# Patient Record
Sex: Female | Born: 1935
Health system: Southern US, Community
[De-identification: ages and names within clinical notes are randomized; demographics above are authoritative.]

## PROBLEM LIST (undated history)

## (undated) DIAGNOSIS — G20A1 Parkinson's disease without dyskinesia, without mention of fluctuations: Secondary | ICD-10-CM

## (undated) DIAGNOSIS — I10 Essential (primary) hypertension: Secondary | ICD-10-CM

## (undated) DIAGNOSIS — K219 Gastro-esophageal reflux disease without esophagitis: Secondary | ICD-10-CM

## (undated) DIAGNOSIS — I4510 Unspecified right bundle-branch block: Secondary | ICD-10-CM

## (undated) DIAGNOSIS — G709 Myoneural disorder, unspecified: Secondary | ICD-10-CM

## (undated) DIAGNOSIS — M8580 Other specified disorders of bone density and structure, unspecified site: Secondary | ICD-10-CM

## (undated) DIAGNOSIS — I890 Lymphedema, not elsewhere classified: Secondary | ICD-10-CM

## (undated) DIAGNOSIS — F419 Anxiety disorder, unspecified: Secondary | ICD-10-CM

## (undated) DIAGNOSIS — C50919 Malignant neoplasm of unspecified site of unspecified female breast: Secondary | ICD-10-CM

## (undated) DIAGNOSIS — G20 Parkinson's disease: Secondary | ICD-10-CM

## (undated) DIAGNOSIS — R5382 Chronic fatigue, unspecified: Secondary | ICD-10-CM

## (undated) DIAGNOSIS — K5790 Diverticulosis of intestine, part unspecified, without perforation or abscess without bleeding: Secondary | ICD-10-CM

## (undated) DIAGNOSIS — N841 Polyp of cervix uteri: Secondary | ICD-10-CM

## (undated) DIAGNOSIS — E039 Hypothyroidism, unspecified: Secondary | ICD-10-CM

## (undated) DIAGNOSIS — K589 Irritable bowel syndrome without diarrhea: Secondary | ICD-10-CM

## (undated) DIAGNOSIS — M1990 Unspecified osteoarthritis, unspecified site: Secondary | ICD-10-CM

## (undated) DIAGNOSIS — G4700 Insomnia, unspecified: Secondary | ICD-10-CM

## (undated) DIAGNOSIS — Z6791 Unspecified blood type, Rh negative: Secondary | ICD-10-CM

## (undated) DIAGNOSIS — F32A Depression, unspecified: Secondary | ICD-10-CM

## (undated) DIAGNOSIS — C73 Malignant neoplasm of thyroid gland: Secondary | ICD-10-CM

## (undated) DIAGNOSIS — F329 Major depressive disorder, single episode, unspecified: Secondary | ICD-10-CM

## (undated) HISTORY — DX: Unspecified right bundle-branch block: I45.10

## (undated) HISTORY — DX: Other specified disorders of bone density and structure, unspecified site: M85.80

## (undated) HISTORY — PX: DILATION AND CURETTAGE OF UTERUS: SHX78

## (undated) HISTORY — PX: MASTECTOMY: SHX3

## (undated) HISTORY — PX: EYE SURGERY: SHX253

## (undated) HISTORY — PX: TONSILLECTOMY: SUR1361

## (undated) HISTORY — PX: THYROIDECTOMY: SHX17

## (undated) HISTORY — DX: Essential (primary) hypertension: I10

## (undated) HISTORY — DX: Insomnia, unspecified: G47.00

## (undated) HISTORY — DX: Malignant neoplasm of thyroid gland: C73

## (undated) HISTORY — DX: Unspecified blood type, rh negative: Z67.91

## (undated) HISTORY — DX: Malignant neoplasm of unspecified site of unspecified female breast: C50.919

## (undated) HISTORY — DX: Gastro-esophageal reflux disease without esophagitis: K21.9

## (undated) HISTORY — DX: Irritable bowel syndrome, unspecified: K58.9

## (undated) HISTORY — PX: HYSTEROSCOPY: SHX211

## (undated) HISTORY — DX: Parkinson's disease without dyskinesia, without mention of fluctuations: G20.A1

## (undated) HISTORY — DX: Polyp of cervix uteri: N84.1

## (undated) HISTORY — DX: Chronic fatigue, unspecified: R53.82

## (undated) HISTORY — DX: Parkinson's disease: G20

## (undated) HISTORY — DX: Anxiety disorder, unspecified: F41.9

## (undated) HISTORY — DX: Diverticulosis of intestine, part unspecified, without perforation or abscess without bleeding: K57.90

## (undated) HISTORY — PX: TUBAL LIGATION: SHX77

## (undated) HISTORY — DX: Lymphedema, not elsewhere classified: I89.0

---

## 1977-01-24 HISTORY — PX: BREAST SURGERY: SHX581

## 1977-01-24 HISTORY — PX: APPENDECTOMY: SHX54

## 1997-03-31 ENCOUNTER — Other Ambulatory Visit: Admission: RE | Admit: 1997-03-31 | Discharge: 1997-03-31 | Payer: Self-pay | Admitting: Obstetrics and Gynecology

## 1997-12-30 ENCOUNTER — Other Ambulatory Visit: Admission: RE | Admit: 1997-12-30 | Discharge: 1997-12-30 | Payer: Self-pay | Admitting: Obstetrics and Gynecology

## 1998-04-07 ENCOUNTER — Ambulatory Visit (HOSPITAL_COMMUNITY): Admission: RE | Admit: 1998-04-07 | Discharge: 1998-04-07 | Payer: Self-pay | Admitting: Internal Medicine

## 1998-04-07 ENCOUNTER — Encounter: Payer: Self-pay | Admitting: Internal Medicine

## 1999-01-12 ENCOUNTER — Other Ambulatory Visit: Admission: RE | Admit: 1999-01-12 | Discharge: 1999-01-12 | Payer: Self-pay | Admitting: Obstetrics and Gynecology

## 2000-01-13 ENCOUNTER — Other Ambulatory Visit: Admission: RE | Admit: 2000-01-13 | Discharge: 2000-01-13 | Payer: Self-pay | Admitting: Obstetrics and Gynecology

## 2000-09-12 ENCOUNTER — Ambulatory Visit (HOSPITAL_COMMUNITY): Admission: RE | Admit: 2000-09-12 | Discharge: 2000-09-12 | Payer: Self-pay | Admitting: Gastroenterology

## 2000-09-12 ENCOUNTER — Encounter (INDEPENDENT_AMBULATORY_CARE_PROVIDER_SITE_OTHER): Payer: Self-pay

## 2001-01-12 ENCOUNTER — Other Ambulatory Visit: Admission: RE | Admit: 2001-01-12 | Discharge: 2001-01-12 | Payer: Self-pay | Admitting: Obstetrics and Gynecology

## 2001-04-10 ENCOUNTER — Encounter: Admission: RE | Admit: 2001-04-10 | Discharge: 2001-04-10 | Payer: Self-pay | Admitting: Internal Medicine

## 2001-04-10 ENCOUNTER — Encounter: Payer: Self-pay | Admitting: Internal Medicine

## 2001-11-12 ENCOUNTER — Encounter: Admission: RE | Admit: 2001-11-12 | Discharge: 2001-11-12 | Payer: Self-pay | Admitting: Orthopedic Surgery

## 2001-11-12 ENCOUNTER — Encounter: Payer: Self-pay | Admitting: Orthopedic Surgery

## 2001-11-13 ENCOUNTER — Ambulatory Visit (HOSPITAL_BASED_OUTPATIENT_CLINIC_OR_DEPARTMENT_OTHER): Admission: RE | Admit: 2001-11-13 | Discharge: 2001-11-13 | Payer: Self-pay | Admitting: Orthopedic Surgery

## 2001-11-14 ENCOUNTER — Encounter (INDEPENDENT_AMBULATORY_CARE_PROVIDER_SITE_OTHER): Payer: Self-pay | Admitting: *Deleted

## 2002-01-14 ENCOUNTER — Other Ambulatory Visit: Admission: RE | Admit: 2002-01-14 | Discharge: 2002-01-14 | Payer: Self-pay | Admitting: Obstetrics and Gynecology

## 2002-09-12 ENCOUNTER — Encounter: Payer: Self-pay | Admitting: Orthopedic Surgery

## 2002-09-12 ENCOUNTER — Encounter: Admission: RE | Admit: 2002-09-12 | Discharge: 2002-09-12 | Payer: Self-pay | Admitting: Orthopedic Surgery

## 2002-09-12 ENCOUNTER — Encounter: Payer: Self-pay | Admitting: Radiology

## 2002-09-26 ENCOUNTER — Encounter: Payer: Self-pay | Admitting: Orthopedic Surgery

## 2002-09-26 ENCOUNTER — Encounter: Admission: RE | Admit: 2002-09-26 | Discharge: 2002-09-26 | Payer: Self-pay | Admitting: Orthopedic Surgery

## 2003-01-29 ENCOUNTER — Other Ambulatory Visit: Admission: RE | Admit: 2003-01-29 | Discharge: 2003-01-29 | Payer: Self-pay | Admitting: Obstetrics and Gynecology

## 2003-12-15 ENCOUNTER — Other Ambulatory Visit: Admission: RE | Admit: 2003-12-15 | Discharge: 2003-12-15 | Payer: Self-pay | Admitting: Obstetrics and Gynecology

## 2004-01-12 ENCOUNTER — Encounter: Admission: RE | Admit: 2004-01-12 | Discharge: 2004-01-12 | Payer: Self-pay | Admitting: Obstetrics and Gynecology

## 2004-01-15 ENCOUNTER — Encounter (INDEPENDENT_AMBULATORY_CARE_PROVIDER_SITE_OTHER): Payer: Self-pay | Admitting: *Deleted

## 2004-01-15 ENCOUNTER — Ambulatory Visit (HOSPITAL_BASED_OUTPATIENT_CLINIC_OR_DEPARTMENT_OTHER): Admission: RE | Admit: 2004-01-15 | Discharge: 2004-01-15 | Payer: Self-pay | Admitting: Obstetrics and Gynecology

## 2004-01-15 ENCOUNTER — Ambulatory Visit (HOSPITAL_COMMUNITY): Admission: RE | Admit: 2004-01-15 | Discharge: 2004-01-15 | Payer: Self-pay | Admitting: Obstetrics and Gynecology

## 2004-06-25 ENCOUNTER — Encounter: Admission: RE | Admit: 2004-06-25 | Discharge: 2004-06-25 | Payer: Self-pay | Admitting: Orthopedic Surgery

## 2004-07-10 ENCOUNTER — Encounter: Admission: RE | Admit: 2004-07-10 | Discharge: 2004-07-10 | Payer: Self-pay | Admitting: Orthopedic Surgery

## 2004-07-12 ENCOUNTER — Encounter: Admission: RE | Admit: 2004-07-12 | Discharge: 2004-07-12 | Payer: Self-pay | Admitting: Orthopedic Surgery

## 2005-02-16 ENCOUNTER — Other Ambulatory Visit: Admission: RE | Admit: 2005-02-16 | Discharge: 2005-02-16 | Payer: Self-pay | Admitting: Obstetrics and Gynecology

## 2006-02-27 ENCOUNTER — Other Ambulatory Visit: Admission: RE | Admit: 2006-02-27 | Discharge: 2006-02-27 | Payer: Self-pay | Admitting: Obstetrics and Gynecology

## 2007-03-01 ENCOUNTER — Other Ambulatory Visit: Admission: RE | Admit: 2007-03-01 | Discharge: 2007-03-01 | Payer: Self-pay | Admitting: Obstetrics and Gynecology

## 2007-09-24 ENCOUNTER — Encounter: Admission: RE | Admit: 2007-09-24 | Discharge: 2007-09-24 | Payer: Self-pay | Admitting: Obstetrics and Gynecology

## 2007-09-26 ENCOUNTER — Encounter: Payer: Self-pay | Admitting: Obstetrics and Gynecology

## 2007-09-26 ENCOUNTER — Ambulatory Visit (HOSPITAL_BASED_OUTPATIENT_CLINIC_OR_DEPARTMENT_OTHER): Admission: RE | Admit: 2007-09-26 | Discharge: 2007-09-26 | Payer: Self-pay | Admitting: Obstetrics and Gynecology

## 2007-10-02 ENCOUNTER — Ambulatory Visit: Payer: Self-pay | Admitting: Obstetrics and Gynecology

## 2007-10-31 ENCOUNTER — Ambulatory Visit: Payer: Self-pay | Admitting: Obstetrics and Gynecology

## 2007-11-13 ENCOUNTER — Ambulatory Visit: Payer: Self-pay | Admitting: Internal Medicine

## 2007-11-14 DIAGNOSIS — I10 Essential (primary) hypertension: Secondary | ICD-10-CM | POA: Insufficient documentation

## 2007-11-14 DIAGNOSIS — L0291 Cutaneous abscess, unspecified: Secondary | ICD-10-CM | POA: Insufficient documentation

## 2007-11-14 DIAGNOSIS — Z85850 Personal history of malignant neoplasm of thyroid: Secondary | ICD-10-CM | POA: Insufficient documentation

## 2007-11-14 DIAGNOSIS — F329 Major depressive disorder, single episode, unspecified: Secondary | ICD-10-CM | POA: Insufficient documentation

## 2007-11-14 DIAGNOSIS — F3289 Other specified depressive episodes: Secondary | ICD-10-CM | POA: Insufficient documentation

## 2007-11-14 DIAGNOSIS — Z853 Personal history of malignant neoplasm of breast: Secondary | ICD-10-CM | POA: Insufficient documentation

## 2007-11-14 DIAGNOSIS — L0390 Cellulitis, unspecified: Secondary | ICD-10-CM

## 2007-11-14 DIAGNOSIS — F32A Depression, unspecified: Secondary | ICD-10-CM | POA: Insufficient documentation

## 2007-11-26 ENCOUNTER — Encounter: Payer: Self-pay | Admitting: Internal Medicine

## 2008-03-03 ENCOUNTER — Encounter: Payer: Self-pay | Admitting: Obstetrics and Gynecology

## 2008-03-03 ENCOUNTER — Ambulatory Visit: Payer: Self-pay | Admitting: Obstetrics and Gynecology

## 2008-03-03 ENCOUNTER — Other Ambulatory Visit: Admission: RE | Admit: 2008-03-03 | Discharge: 2008-03-03 | Payer: Self-pay | Admitting: Obstetrics and Gynecology

## 2008-03-05 ENCOUNTER — Ambulatory Visit: Payer: Self-pay | Admitting: Obstetrics and Gynecology

## 2008-05-22 ENCOUNTER — Encounter: Payer: Self-pay | Admitting: Sports Medicine

## 2008-05-27 ENCOUNTER — Encounter: Admission: RE | Admit: 2008-05-27 | Discharge: 2008-05-27 | Payer: Self-pay | Admitting: Orthopedic Surgery

## 2008-06-13 ENCOUNTER — Ambulatory Visit: Payer: Self-pay | Admitting: Women's Health

## 2008-07-23 ENCOUNTER — Ambulatory Visit: Payer: Self-pay | Admitting: Obstetrics and Gynecology

## 2008-08-01 ENCOUNTER — Ambulatory Visit: Payer: Self-pay | Admitting: Obstetrics and Gynecology

## 2008-12-08 ENCOUNTER — Ambulatory Visit: Payer: Self-pay | Admitting: Obstetrics and Gynecology

## 2008-12-15 ENCOUNTER — Ambulatory Visit: Payer: Self-pay | Admitting: Obstetrics and Gynecology

## 2008-12-24 ENCOUNTER — Ambulatory Visit: Payer: Self-pay | Admitting: Obstetrics and Gynecology

## 2009-01-15 ENCOUNTER — Ambulatory Visit: Payer: Self-pay | Admitting: Obstetrics and Gynecology

## 2009-02-25 ENCOUNTER — Encounter: Admission: RE | Admit: 2009-02-25 | Discharge: 2009-02-25 | Payer: Self-pay | Admitting: Obstetrics and Gynecology

## 2009-02-27 ENCOUNTER — Encounter: Admission: RE | Admit: 2009-02-27 | Discharge: 2009-02-27 | Payer: Self-pay | Admitting: Orthopedic Surgery

## 2009-03-03 ENCOUNTER — Encounter: Payer: Self-pay | Admitting: Sports Medicine

## 2009-03-05 ENCOUNTER — Ambulatory Visit: Payer: Self-pay | Admitting: Obstetrics and Gynecology

## 2009-03-05 ENCOUNTER — Other Ambulatory Visit: Admission: RE | Admit: 2009-03-05 | Discharge: 2009-03-05 | Payer: Self-pay | Admitting: Obstetrics and Gynecology

## 2009-04-30 ENCOUNTER — Ambulatory Visit: Payer: Self-pay | Admitting: Sports Medicine

## 2009-04-30 DIAGNOSIS — M5020 Other cervical disc displacement, unspecified cervical region: Secondary | ICD-10-CM | POA: Insufficient documentation

## 2009-04-30 DIAGNOSIS — M546 Pain in thoracic spine: Secondary | ICD-10-CM | POA: Insufficient documentation

## 2009-05-12 ENCOUNTER — Ambulatory Visit: Payer: Self-pay | Admitting: Sports Medicine

## 2009-05-12 DIAGNOSIS — M1710 Unilateral primary osteoarthritis, unspecified knee: Secondary | ICD-10-CM

## 2009-05-12 DIAGNOSIS — IMO0002 Reserved for concepts with insufficient information to code with codable children: Secondary | ICD-10-CM | POA: Insufficient documentation

## 2009-05-28 ENCOUNTER — Ambulatory Visit: Payer: Self-pay | Admitting: Sports Medicine

## 2009-06-03 ENCOUNTER — Encounter: Payer: Self-pay | Admitting: Sports Medicine

## 2009-06-15 ENCOUNTER — Encounter: Payer: Self-pay | Admitting: Sports Medicine

## 2009-06-25 ENCOUNTER — Ambulatory Visit: Payer: Self-pay | Admitting: Sports Medicine

## 2009-06-26 ENCOUNTER — Ambulatory Visit: Payer: Self-pay | Admitting: Women's Health

## 2009-08-10 ENCOUNTER — Ambulatory Visit: Payer: Self-pay | Admitting: Sports Medicine

## 2009-08-13 ENCOUNTER — Encounter: Admission: RE | Admit: 2009-08-13 | Discharge: 2009-08-13 | Payer: Self-pay | Admitting: Internal Medicine

## 2009-09-02 ENCOUNTER — Ambulatory Visit: Payer: Self-pay | Admitting: Sports Medicine

## 2009-09-17 ENCOUNTER — Ambulatory Visit: Payer: Self-pay | Admitting: Sports Medicine

## 2009-10-14 ENCOUNTER — Ambulatory Visit: Payer: Self-pay | Admitting: Sports Medicine

## 2009-11-05 ENCOUNTER — Ambulatory Visit: Payer: Self-pay | Admitting: Sports Medicine

## 2009-11-18 ENCOUNTER — Ambulatory Visit: Payer: Self-pay | Admitting: Sports Medicine

## 2009-12-03 ENCOUNTER — Ambulatory Visit: Payer: Self-pay | Admitting: Sports Medicine

## 2009-12-23 ENCOUNTER — Ambulatory Visit: Payer: Self-pay | Admitting: Sports Medicine

## 2009-12-31 ENCOUNTER — Telehealth: Payer: Self-pay | Admitting: Sports Medicine

## 2010-01-07 ENCOUNTER — Ambulatory Visit: Payer: Self-pay | Admitting: Sports Medicine

## 2010-01-08 ENCOUNTER — Ambulatory Visit: Payer: Self-pay | Admitting: Gynecology

## 2010-02-04 ENCOUNTER — Ambulatory Visit
Admission: RE | Admit: 2010-02-04 | Discharge: 2010-02-04 | Payer: Self-pay | Source: Home / Self Care | Attending: Sports Medicine | Admitting: Sports Medicine

## 2010-02-13 ENCOUNTER — Other Ambulatory Visit: Payer: Self-pay | Admitting: Internal Medicine

## 2010-02-13 DIAGNOSIS — Z1239 Encounter for other screening for malignant neoplasm of breast: Secondary | ICD-10-CM

## 2010-02-14 ENCOUNTER — Encounter: Payer: Self-pay | Admitting: Orthopedic Surgery

## 2010-02-17 ENCOUNTER — Encounter
Admission: RE | Admit: 2010-02-17 | Discharge: 2010-02-17 | Payer: Self-pay | Source: Home / Self Care | Attending: Sports Medicine | Admitting: Sports Medicine

## 2010-02-17 ENCOUNTER — Ambulatory Visit
Admission: RE | Admit: 2010-02-17 | Discharge: 2010-02-17 | Payer: Self-pay | Source: Home / Self Care | Attending: Sports Medicine | Admitting: Sports Medicine

## 2010-02-23 NOTE — Assessment & Plan Note (Signed)
Summary: 2:45 appt,LOWER BACK/HIP PAIN,MC   Vital Signs:  Patient profile:   77 year old female BP sitting:   146 / 79  Vitals Entered By: Lillia Pauls CMA (September 17, 2009 2:41 PM)  CC:  back, hip, and and knee pain.  History of Present Illness: 77 yo F:   1. Low Back Pain: x 3 weeks, bilateral, chronic, worse with standing for long periods of time, good ROM, no  radiation, no weakness/numbness/tingling in legs, no bowel/bladder incontinence, no Hx of injury, + osteopenia - on Evista, calcium, and vitamin D. requests more exercises for strengthening core and low back.  2. Hip Pain: left, actually located in left buttock, x 3 weeks, worsening, no radiation, was told at the last visit that she was tender over her gluteus medius.  3. Knee Pain: right, improved after injection though she does endorse having a steroid flare after.  Current Medications (verified): 1)  Synthroid 200 Mcg Tabs (Levothyroxine Sodium) .... Take 1 Tablet By Mouth Once A Day 2)  Evista 60 Mg Tabs (Raloxifene Hcl) .... Take 1 Tablet By Mouth Once A Day 3)  Avapro 75 Mg Tabs (Irbesartan) .... Take 1 Tablet By Mouth Once A Day 4)  Clonazepam Odt 0.25 Mg Tbdp (Clonazepam) .... Take 1 Tablet By Mouth Once A Day 5)  Norvasc 5 Mg Tabs (Amlodipine Besylate) .... Take 1 Tablet By Mouth Once A Day 6)  Citalopram Hydrobromide 20 Mg Tabs (Citalopram Hydrobromide) .... Take 1 Tablet By Mouth Once A Day 7)  Vitamin C Cr 500 Mg Cr-Tabs (Ascorbic Acid) .... Take 1 Tablet By Mouth Once A Day 8)  Caltrate 600+d 600-400 Mg-Unit Tabs (Calcium Carbonate-Vitamin D) .... Take 1 Tablet By Mouth Two Times A Day 9)  Cvs Stool Softener 100 Mg Caps (Docusate Sodium) .... Take 1 Tablet By Mouth Once A Day  Allergies (verified): 1)  ! Septra PMH-FH-SH reviewed for relevance  Review of Systems MS:  Complains of joint pain and low back pain; denies joint redness, joint swelling, loss of strength, and mid back pain. Neuro:  Denies  numbness, poor balance, and seizures.  Physical Exam  General:  Well-developed, well-nourished, in no acute distress; alert, appropriate and cooperative throughout examination. Vitals reviewed. Msk:  Lumbar with FROM. Tender to palpation at right SIJ as well as some tenderness to palpation along lumbar paraspinal mm on left.  TTP directly over gluteus medius on the left. Normal LE reflexes. Moderately weak hip flexion. MIldly weak hip abduction. FABER negative. Pulses:  2 + DP. Extremities:  No edema. Neurologic:  Sensation intact to light touch and DTRs symmetrical and normal.   Psych:  Oriented X3, memory intact for recent and remote, good eye contact, and not anxious appearing.     Impression & Recommendations:  Problem # 1:  LUMBAGO (ICD-724.2) Assessment Unchanged  Her updated medication list for this problem includes:    Meloxicam 15 Mg Tabs (Meloxicam) ..... One by mouth daily  will add trial of mobic and keep up  exercise program w more water and yoga classses  Problem # 2:  HIP PAIN, LEFT (ICD-719.45) Assessment: Unchanged  Her updated medication list for this problem includes:    Meloxicam 15 Mg Tabs (Meloxicam) ..... One by mouth daily  Problem # 3:  DEGENERATIVE JOINT DISEASE, RIGHT KNEE (ICD-715.96)  Her updated medication list for this problem includes:    Meloxicam 15 Mg Tabs (Meloxicam) ..... One by mouth daily  this seems to have responjded well to  CSI can repeat in 3 mos if needed  Complete Medication List: 1)  Synthroid 200 Mcg Tabs (Levothyroxine sodium) .... Take 1 tablet by mouth once a day 2)  Evista 60 Mg Tabs (Raloxifene hcl) .... Take 1 tablet by mouth once a day 3)  Avapro 75 Mg Tabs (Irbesartan) .... Take 1 tablet by mouth once a day 4)  Clonazepam Odt 0.25 Mg Tbdp (Clonazepam) .... Take 1 tablet by mouth once a day 5)  Norvasc 5 Mg Tabs (Amlodipine besylate) .... Take 1 tablet by mouth once a day 6)  Citalopram Hydrobromide 20 Mg Tabs (Citalopram  hydrobromide) .... Take 1 tablet by mouth once a day 7)  Vitamin C Cr 500 Mg Cr-tabs (Ascorbic acid) .... Take 1 tablet by mouth once a day 8)  Caltrate 600+d 600-400 Mg-unit Tabs (Calcium carbonate-vitamin d) .... Take 1 tablet by mouth two times a day 9)  Cvs Stool Softener 100 Mg Caps (Docusate sodium) .... Take 1 tablet by mouth once a day 10)  Meloxicam 15 Mg Tabs (Meloxicam) .... One by mouth daily  Patient Instructions: 1)  Continue your previous exercises. 2)  It is okay to start Yoga once a weak. Concentrate on the flexion positions. Limit the extension positions. 3)  Continue water aerobics 3 times a week. It is okay to use light weights. Prescriptions: MELOXICAM 15 MG TABS (MELOXICAM) one by mouth daily  #30 x 0   Entered by:   Helane Rima DO   Authorized by:   Enid Baas MD   Signed by:   Helane Rima DO on 09/17/2009   Method used:   Electronically to        [facility]  [phone]* (retail)       [address] [place], [place]  [address]       Ph: [phone] or [phone]       Fax: [phone]   RxID:   [id number]

## 2010-02-23 NOTE — Letter (Signed)
Summary: Generic Letter  [facility]  [address]   [place], [place] [address]   Phone: [phone]  Fax: [phone]    04/30/2009  To Whom It May Concern:  Today I evaluated my patient:  Michelle Sims [address] [place], [place]  [address]  [name] has a cervical disk injury with radiculopathy and will be unable to travel for the next 3 months.         Sincerely,       Enid Baas MD

## 2010-02-23 NOTE — Assessment & Plan Note (Signed)
Summary: NP,L SHOULDER PAIN,MC   Vital Signs:  Patient profile:   77 year old female Height:      63 inches Weight:      130 pounds BMI:     23.11 BP sitting:   137 / 83  Vitals Entered By: Lillia Pauls CMA (April 30, 2009 2:01 PM)  History of Present Illness: Complex patient who has had multiple evaluations and Tx for chronic pain that radiates along the medial border of her left scapula to the tip of the scapula.  In Jan 2010 Dr Thurston Hole treated her as mid thoracic MM strain and tried flector patches sans relief.  In Feb 2010 she saw Dr Sandria Manly who noted: Possible left T9 facet arthropathy as a cause Pain pattern suggestive of cervical radiculopathy  Before this she had failed Tx at pain clinic with accupuncture from Dr Ricki Miller  Sequence is not entirely clear but sounds like she has had several other evaluations and TX. This includes facet joint injection of left T7/8 and T8/9 by Dr Benard Rink.  In past has failed to tolerate: Lyrica Amitriptyline Gabapentin By Hx I do not think she tried these very long before being scared of sideeffects of drowsiness and dizziness.  Dr Johnella Moloney follows her for [facility].  Comes to see my opinion.  Allergies: 1)  ! Septra  Physical Exam  General:  Well-developed,well-nourished,in no acute distress; alert,appropriate and cooperative throughout examination Neck:  Head forward position by 10 deg extension to only 10 deg full flexion good rotation  limited lat bending part to left by 30% Msk:  Inspection reveals no abnormalities or assymetry; no atrophy noted; palpation is unremarkable;  ROM is full in all planes. specific strength testing of Rotator cuff mm reveals good strength throughout; no signs of impingement; speeds and yergason's tests normal;  no labral pathology noted;  One positive finding is that abnormal  scapular function observed.  Left scapula shows slight winging after repeat abduction by 5 x  negative painful arc and no drop arm  sign.    Impression & Recommendations:  Problem # 1:  BACK PAIN, THORACIC REGION, CHRONIC (ICD-724.1)  This is chronic and severe but I do not detect muscle injury I suspect this is referred pain With failure of relief from accupuncture and from facet injection I think the cervical radiculopathy is likely cause  Orders: Cervical Foam Collar Non Adjustable (L0120)  Problem # 2:  DEGENERATIVE DISC DISEASE, CERVICAL SPINE, W/RADICULOPATHY (ICD-722.0)  Seems to affect the accessory nerve  WE reviewed her MRI and there is sign degenration at C5/6 and 6/7 There is diks herniation and even some edema throught these levels  This would certianly be consistent with her sxs  see isntructions but will try medications, collar and exercises  reck 4 wks  Orders: Cervical Foam Collar Non Adjustable (L0120)  Complete Medication List: 1)  Synthroid 200 Mcg Tabs (Levothyroxine sodium) .... Take 1 tablet by mouth once a day 2)  Evista 60 Mg Tabs (Raloxifene hcl) .... Take 1 tablet by mouth once a day 3)  Avapro 75 Mg Tabs (Irbesartan) .... Take 1 tablet by mouth once a day 4)  Clonazepam Odt 0.25 Mg Tbdp (Clonazepam) .... Take 1 tablet by mouth once a day 5)  Norvasc 5 Mg Tabs (Amlodipine besylate) .... Take 1 tablet by mouth once a day 6)  Citalopram Hydrobromide 20 Mg Tabs (Citalopram hydrobromide) .... Take 1 tablet by mouth once a day 7)  Vitamin C Cr 500 Mg  Cr-tabs (Ascorbic acid) .... Take 1 tablet by mouth once a day 8)  Caltrate 600+d 600-400 Mg-unit Tabs (Calcium carbonate-vitamin d) .... Take 1 tablet by mouth two times a day 9)  Cvs Stool Softener 100 Mg Caps (Docusate sodium) .... Take 1 tablet by mouth once a day  Patient Instructions: 1)  Cervical Radiculopathy affecting the accessory nerve that innervates to the tip of your shoulder blade. 2)  Cervical collar - wear for 1 hour two or three times per day 3)  Gabapentin - use 100 mgm at night for 10 days 4)  Then 200 mgm at  night for 10 days 5)  then 300 mgm at night for 10 days 6)  then see me 7)  exercise for scapular stabilization 8)  lawnmower exercise 9)  Robbery 10)  flys  11)  for these just to 5 of each in morning 12)  after a week try 5 twice daily 13)  after 10 days try adding 1 lb 14)  good posture

## 2010-02-23 NOTE — Assessment & Plan Note (Signed)
Summary: KNEE INJECTION PER Alizzon Dioguardi/MJD   Vital Signs:  Patient profile:   77 year old female BP sitting:   153 / 83  Vitals Entered By: Lillia Pauls CMA (September 02, 2009 11:15 AM)  History of Present Illness: Michelle Sims is back for injection of RT knee has known advanced DJD Lateral pain on RT knee w walking or standing too long wants to go to beach this week last time injection gave her 2 to 3 mos of some pain relief  MID Back Pain this is chronic but has done really well on rehab and gabapentin now down to 100 mgm once daily w no return of mid back pain  Low back and left hip pain Has some chronic LBP not doing stretches correctly in seated position once she demos these pain to left hip area after standing but not specific injury  Allergies: 1)  ! Septra  Physical Exam  General:  Well-developed,well-nourished,in no acute distress; alert,appropriate and cooperative throughout examination Msk:  mid back is not tender today and moving well  Low back is shifted to left w walking and also slt flexed TTP directly over glut medius  RT knee - chronic DJD changes with spurring along Joint line lags full ext by 5 deg Additional Exam:  MSK [place] there is an effusion in the suprapatellar pouch marked spurring and degenerative meniscus change laterally medical meniscus actually looks good  iamges saved   Impression & Recommendations:  Problem # 1:  DEGENERATIVE JOINT DISEASE, RIGHT KNEE (ICD-715.96)  cleanse with alcohol Topical analgesic spray : Ethyl choride Joint RT knee Approached in typical fashion with: suprapatellar pouch visualized directly by [place] and injection place from sup lateral approach Completed without difficulty Meds: 1cc kenalog 40/ 4cc lidocaine 1% Needle: 25 g and 1.5 in Aftercare instructions and Red flags advised  with this degree of DJD hope to get 1 mo or so of relief  Orders: [place] LIMITED (04540) Joint Aspirate / Injection, Large (20610) Kenalog  10 mg inj (J3301)  Problem # 2:  BACK PAIN, THORACIC REGION, CHRONIC (ICD-724.1) this is much improved  cont slow wean of gabapentin  will reck in 2 mos  Problem # 3:  LUMBAGO (ICD-724.2) reinstructed in exercises  aslo will give few hip abduction and rotation exercises on left  Complete Medication List: 1)  Synthroid 200 Mcg Tabs (Levothyroxine sodium) .... Take 1 tablet by mouth once a day 2)  Evista 60 Mg Tabs (Raloxifene hcl) .... Take 1 tablet by mouth once a day 3)  Avapro 75 Mg Tabs (Irbesartan) .... Take 1 tablet by mouth once a day 4)  Clonazepam Odt 0.25 Mg Tbdp (Clonazepam) .... Take 1 tablet by mouth once a day 5)  Norvasc 5 Mg Tabs (Amlodipine besylate) .... Take 1 tablet by mouth once a day 6)  Citalopram Hydrobromide 20 Mg Tabs (Citalopram hydrobromide) .... Take 1 tablet by mouth once a day 7)  Vitamin C Cr 500 Mg Cr-tabs (Ascorbic acid) .... Take 1 tablet by mouth once a day 8)  Caltrate 600+d 600-400 Mg-unit Tabs (Calcium carbonate-vitamin d) .... Take 1 tablet by mouth two times a day 9)  Cvs Stool Softener 100 Mg Caps (Docusate sodium) .... Take 1 tablet by mouth once a day

## 2010-02-23 NOTE — Assessment & Plan Note (Signed)
Summary: L SHOULDER PAIN,MC   Vital Signs:  Patient profile:   77 year old female Pulse rate:   76 / minute BP sitting:   144 / 84  (left arm)  Vitals Entered By: Rochele Pages RN (December 03, 2009 10:58 AM) CC: f/u back pain, new lt shoulder pain   CC:  f/u back pain and new lt shoulder pain.  History of Present Illness: Thoracic back pain has returned stopped gabapentin 2 wks ago 2/2 drowsiness no real change and has good and bad days with upper back  now w some left shoulder pain hurts w raising arm over shoulder height  post pain over CT and T1 to 3 post processes  april 7 to may 20 took gabapentin and thoracic pain went away for 5 mos  Oct 5 yoga class and pain came back afterwards  Allergies: 1)  ! Septra  Physical Exam  General:  Well-developed,well-nourished,in no acute distress; alert,appropriate and cooperative throughout examination Msk:  head forward position mild TTP over C7 to T3 post processes  Left shoulder pain at 90 to 120 deg of abduction + impingement on hawkins and empty can mild pain and weakness on IR Ext rot is strong back scratch is limited 20% on left  neg drop arm sign  limited cervical motion on ext, rotation and lat bending   Impression & Recommendations:  Problem # 1:  BACK PAIN, THORACIC REGION, CHRONIC (ICD-724.1)  The following medications were removed from the medication list:    Meloxicam 15 Mg Tabs (Meloxicam) ..... One by mouth daily Her updated medication list for this problem includes:    Diclofenac Sodium 50 Mg Tbec (Diclofenac sodium) .Marland Kitchen... 1 by mouth two times a day  see detailed instructions and trial off gabapentin  try an extra hafl tab of clonazepam  Problem # 2:  SHOULDER PAIN, LEFT (ICD-719.41)  The following medications were removed from the medication list:    Meloxicam 15 Mg Tabs (Meloxicam) ..... One by mouth daily Her updated medication list for this problem includes:    Diclofenac Sodium 50 Mg Tbec  (Diclofenac sodium) .Marland Kitchen... 1 by mouth bid   classic signs of impingement  trial on rehab w theraband trial on diclofenac  reck 3 wks  Complete Medication List: 1)  Synthroid 200 Mcg Tabs (Levothyroxine sodium) .... Take 1 tablet by mouth once a day 2)  Evista 60 Mg Tabs (Raloxifene hcl) .... Take 1 tablet by mouth once a day 3)  Clonazepam Odt 0.25 Mg Tbdp (Clonazepam) .... Take 1 tablet by mouth once a day 4)  Norvasc 5 Mg Tabs (Amlodipine besylate) .... Take 1 tablet by mouth once a day 5)  Citalopram Hydrobromide 20 Mg Tabs (Citalopram hydrobromide) .... Take 1 tablet by mouth once a day 6)  Vitamin C Cr 500 Mg Cr-tabs (Ascorbic acid) .... Take 1 tablet by mouth once a day 7)  Caltrate 600+d 600-400 Mg-unit Tabs (Calcium carbonate-vitamin d) .... Take 1 tablet by mouth two times a day 8)  Cvs Stool Softener 100 Mg Caps (Docusate sodium) .... Take 1 tablet by mouth once a day 9)  Diovan 160 Mg Tabs (Valsartan) .... 1/2 tab qd 10)  Diclofenac Sodium 50 Mg Tbec (Diclofenac sodium) .Marland Kitchen.. 1 by mouth bid  Patient Instructions: 1)  rotator cuff tendinits on left 2)  keep using cervical brace 3)  try using 1/4 to 1/2 tab of clonazepam when you feel the pain 4)  watch your posture 5)  start a new  series of cord exercises to help with back and shoulder 6)  cont pillow  x wall 7)  try heat and or ice Prescriptions: DICLOFENAC SODIUM 50 MG TBEC (DICLOFENAC SODIUM) 1 by mouth bid  #60 x 3   Entered and Authorized by:   Enid Baas MD   Signed by:   Enid Baas MD on 12/03/2009   Method used:   [facility]        [facility]  [phone][facility] (retail)       [address] [place], [place]  [address]       Ph: [phone] or [phone]       Fax: [phone]   RxID:   [id number]    Orders Added: 1)  Est. Patient Level IV [25956]

## 2010-02-23 NOTE — Assessment & Plan Note (Signed)
Summary: KNEE PAIN,MC   Vital Signs:  Patient profile:   77 year old female BP sitting:   146 / 81  Vitals Entered By: Lillia Pauls CMA (May 12, 2009 11:02 AM)  History of Present Illness: Pt presents for follow-up of her mid thoracic back pain in addition to right knee pain that she has had now for 30 years. She was last seen for her back pain and started on gabapentin. She has not been able to tolerate more than 100mg  per day because of drowsiness. She is afraid to try a higher dose for fear that it would make her even sleepier. She has not seen a change in her pain symptoms. She also wanted to review the scapular stabilizing exercises as she cannot remember all of them and because they typically cause her pain when she does the exercises.   Her right knee has also been bothersome for about 30 years. She has been seen by Dr. Thurston Hole in the past and diagnosed with arthritis. Per her report, she has had multiple cortisone injections which are still only mildly helpful in addition to Synvisc injections which were not helpful. She has been told that she needs a joint replacement for definitve treatment but she is not ready to do through with that at this time. She and her husband have also brought in literature about partial knee replacements that have been done at [facility]. She is able to cycle without pain but has difficulty mainly with walking along the medial aspect of her right knee.    Allergies: 1)  ! Septra  Physical Exam  General:  alert and well-developed.   Head:  normocephalic and atraumatic.   Neck:  supple.  No TTP along her cervical spine. Mild TTP over left distal medial periscapular region. Full ROM of her neck without pain. 5/5 grip strength. Neurvascularly intact with her bilateral upper extremities.  Lungs:  normal respiratory effort.   Msk:  Left Scapula Move more laterally than her right but without winging + TTP over muscular attachments of distal medial  periscapular region  Right Knee: Medial compartment osteophyte formation Mild TTP along th medial joint line No effusion or bruising Genu valgum with walking and standing Normal MCL, LCL, ACL and PCL with varus and valgus stress testing + McMurray's mildly with medial joint line pain + Bounce test Full flexion and extension 5/5 strength with resisted knee ROM  Left Knee: Normal inspection with milder osteophyte formation No TTP throughout the knee Full flexion and extension Normal MCL, LCL, ACL and PCL with special testing Neg McMurray's and bounce testing Mild genu valgum again 5/5 strength with resisted knee flexion and extension   Impression & Recommendations:  Problem # 1:  BACK PAIN, THORACIC REGION, CHRONIC (ICD-724.1) no real change but has not taken much of gabapentin  will build dose and have her tolerate side effects to test and see if we can lessen the pain  Problem # 2:  DEGENERATIVE DISC DISEASE, CERVICAL SPINE, W/RADICULOPATHY (ICD-722.0) I think this is the trigger for the mid back pain  Problem # 3:  DEGENERATIVE JOINT DISEASE, RIGHT KNEE (ICD-715.96)  This is severe and would merit repolacement  see Dr Sherene Sires record  will try medial wedge knee support use for walking, etc  Orders: Knee Support Pat cutout ([id number])  Complete Medication List: 1)  Synthroid 200 Mcg Tabs (Levothyroxine sodium) .... Take 1 tablet by mouth once a day 2)  Evista 60 Mg Tabs (Raloxifene hcl) .... Take  1 tablet by mouth once a day 3)  Avapro 75 Mg Tabs (Irbesartan) .... Take 1 tablet by mouth once a day 4)  Clonazepam Odt 0.25 Mg Tbdp (Clonazepam) .... Take 1 tablet by mouth once a day 5)  Norvasc 5 Mg Tabs (Amlodipine besylate) .... Take 1 tablet by mouth once a day 6)  Citalopram Hydrobromide 20 Mg Tabs (Citalopram hydrobromide) .... Take 1 tablet by mouth once a day 7)  Vitamin C Cr 500 Mg Cr-tabs (Ascorbic acid) .... Take 1 tablet by mouth once a day 8)  Caltrate 600+d  600-400 Mg-unit Tabs (Calcium carbonate-vitamin d) .... Take 1 tablet by mouth two times a day 9)  Cvs Stool Softener 100 Mg Caps (Docusate sodium) .... Take 1 tablet by mouth once a day  Patient Instructions: 1)  Start taking 100mg  of the gabapentin at dinner and then another 100mg  at bedtime. 2)  Use the heel wedge while you are walking. 3)  Use the knee support while walking as well.  4)  keep up back exercises at least once a day 5)  we will reck all on your return may 5

## 2010-02-23 NOTE — Assessment & Plan Note (Signed)
Summary: F/U [facility]   Vital Signs:  Patient profile:   77 year old female BP sitting:   133 / 85  History of Present Illness: Unfortunately upper back pain returned p yoga class on Oct 5 Pain had been gone for 5 mos  has had a few days with no pain went to [facility] and had 2 days pain free  sitting and lying do not bother this  pain is upper thoracic spine  does water aerobics and that does not hurt worse  Allergies: 1)  ! Septra  Physical Exam  General:  Well-developed,well-nourished,in no acute distress; alert,appropriate and cooperative throughout examination  when she stands for a few minutes she appears to be uncomfortable - hunching shoulders Msk:  some minimal spasm over LT parascapulr mm at rhomboids soem pain on elevation of left arm over head and on full ext rotation she has full ROM some pain but no weakness on drop arm left  neck is in forward flexed position by 10 deg ROM unchanged from before with some limits   Impression & Recommendations:  Problem # 1:  BACK PAIN, THORACIC REGION, CHRONIC (ICD-724.1) This still seems referred from cerv spine w minimal local TTP and minimal spasm noted  Problem # 2:  DEGENERATIVE DISC DISEASE, CERVICAL SPINE, W/RADICULOPATHY (ICD-722.0) needs to use collar more again  avoid activities that seem to recreate pain - yoga, pilates Ok to cont in water aerobics  see isntructions about exercises  restart gabapentin and cerv collar  Problem # 3:  SHOULDER PAIN, LEFT (ICD-719.41) stop theraband  stick to codman exercises for now to keep ROM  Complete Medication List: 1)  Synthroid 200 Mcg Tabs (Levothyroxine sodium) .... Take 1 tablet by mouth once a day 2)  Evista 60 Mg Tabs (Raloxifene hcl) .... Take 1 tablet by mouth once a day 3)  Clonazepam Odt 0.25 Mg Tbdp (Clonazepam) .... Take 1 tablet by mouth once a day 4)  Norvasc 5 Mg Tabs (Amlodipine besylate) .... Take 1 tablet by mouth once a day 5)  Citalopram  Hydrobromide 20 Mg Tabs (Citalopram hydrobromide) .... Take 1 tablet by mouth once a day 6)  Vitamin C Cr 500 Mg Cr-tabs (Ascorbic acid) .... Take 1 tablet by mouth once a day 7)  Caltrate 600+d 600-400 Mg-unit Tabs (Calcium carbonate-vitamin d) .... Take 1 tablet by mouth two times a day 8)  Cvs Stool Softener 100 Mg Caps (Docusate sodium) .... Take 1 tablet by mouth once a day 9)  Diovan 160 Mg Tabs (Valsartan) .... 1/2 tab qd 10)  Gabapentin 100 Mg Caps (Gabapentin) .Marland Kitchen.. 1 by mouth tid  Patient Instructions: 1)  we should restart gabapentin 2)  first do 100 mgm at night for 5 days 3)  then 100 mgm two times a day for 5 days 4)  then stay on 100 mgm three times a day until you return 5)  make sure and stretch your shoulder over your head and keep up easy shoulder motion 6)  make sure and do exercises where you don't get pain 7)  add easy motion exercises for shoulder on sheet 8)  do a few of the older exercises as long no significant pain/ stop the band 9)  try to lie on back every few hours to stretch neck and spine to be as straight as possible 10)  recheck in 1 month Prescriptions: GABAPENTIN 100 MG CAPS (GABAPENTIN) 1 by mouth tid  #90 x 5   Entered and Authorized by:  Enid Baas MD   Signed by:   Enid Baas MD on 12/23/2009   Method used:   Print then Give to Patient   RxID:   [id number]    Orders Added: 1)  Est. Patient Level IV [14782]

## 2010-02-23 NOTE — Assessment & Plan Note (Signed)
Summary: F/U[facility]   Vital Signs:  Patient profile:   77 year old female Pulse rate:   76 / minute BP sitting:   159 / 92  (left arm) CC: f/u back pain, left foot pain w/ certain shoes   CC:  f/u back pain and left foot pain w/ certain shoes.  History of Present Illness: Back pain improved.  Rt knee pain improved.  Now able to tolerate some walking  Left foot pain when wearing dress shoes. Metatarsal pad helping custom orthotics, but not in dress shoes.  also concerned about writing probs in RT hand primarily after some fatigue worreis about parkinsons has lymphedema in RT arm  Allergies: 1)  ! Septra  Physical Exam  General:  Well-developed,well-nourished,in no acute distress; alert,appropriate and cooperative throughout examination Msk:  Hand grip normal, C8 T1 function normal, no tremor, no weakness on flexion or extension. no cogwheeling No hand pain.   Neck extension 10 degress, full neck flexion. lateral bend left 20 degress, right 10 degress, rotation normal bilat.  foot shows some mod breakdown of transverse arch on left and thin fat pad   Impression & Recommendations:  Problem # 1:  METATARSALGIA (ICD-726.70) MT pads placed in dress shoe use orthotic in walking shoes  see how this does w pain  Problem # 2:  DEGENERATIVE DISC DISEASE, CERVICAL SPINE, W/RADICULOPATHY (ICD-722.0) I think this is key reason for grip and handwriting issues work on neck posture - now has 10 deg forward flex of neck work on ROM  Problem # 3:  DEGENERATIVE JOINT DISEASE, RIGHT KNEE (ICD-715.96)  Her updated medication list for this problem includes:    Meloxicam 15 Mg Tabs (Meloxicam) ..... One by mouth daily  definitely improved p injection and also doing bike  keep up exercises  wilkl reck prn  Problem # 4:  HYPERTENSION (ICD-401.9)  Her updated medication list for this problem includes:    Avapro 75 Mg Tabs (Irbesartan) .Marland Kitchen... Take 1 tablet by mouth once a day  Norvasc 5 Mg Tabs (Amlodipine besylate) .Marland Kitchen... Take 1 tablet by mouth once a day  has had a few elevated pressures  I susggested seeing Dr Kevan Ny again and will send him a copy of this eval  Complete Medication List: 1)  Synthroid 200 Mcg Tabs (Levothyroxine sodium) .... Take 1 tablet by mouth once a day 2)  Evista 60 Mg Tabs (Raloxifene hcl) .... Take 1 tablet by mouth once a day 3)  Avapro 75 Mg Tabs (Irbesartan) .... Take 1 tablet by mouth once a day 4)  Clonazepam Odt 0.25 Mg Tbdp (Clonazepam) .... Take 1 tablet by mouth once a day 5)  Norvasc 5 Mg Tabs (Amlodipine besylate) .... Take 1 tablet by mouth once a day 6)  Citalopram Hydrobromide 20 Mg Tabs (Citalopram hydrobromide) .... Take 1 tablet by mouth once a day 7)  Vitamin C Cr 500 Mg Cr-tabs (Ascorbic acid) .... Take 1 tablet by mouth once a day 8)  Caltrate 600+d 600-400 Mg-unit Tabs (Calcium carbonate-vitamin d) .... Take 1 tablet by mouth two times a day 9)  Cvs Stool Softener 100 Mg Caps (Docusate sodium) .... Take 1 tablet by mouth once a day 10)  Meloxicam 15 Mg Tabs (Meloxicam) .... One by mouth daily  Patient Instructions: 1)  Try pads in shoes, if pain improves bring other shoes to office for pad placement 2)  Work on neck posture, position, and movement 3)  Be careful about back extension in yoga

## 2010-02-23 NOTE — Assessment & Plan Note (Signed)
Summary: F/U [facility]PPER LEFT BACK PAIN,[facility]   Vital Signs:  Patient profile:   77 year old female BP sitting:   138 / 82  Vitals Entered By: Lillia Pauls CMA (November 05, 2009 8:49 AM)  History of Present Illness: 77 yo F with history of thoracic back pain comes in with recurrence of pain. She thinks she may have tweaked her back doing yoga in the past two weeks. She now has pain similar to that she had previously in her mid-back. She does not have tingling or numbness. The pain limits her activity and makes it difficult to get comfortable at night, though it does not keep her from sleep.  She has tried taking gabapentin, which she took in the past, but is only taking 100 mg a day. She has tried the exercises she did in her last episode of pain.  Pain is not changed much in past week and is limiting her although she did do water aerobics without making this worse.  Allergies: 1)  ! Septra  Physical Exam  General:  Well-developed,well-nourished,in no acute distress; alert,appropriate and cooperative throughout examination Msk:  Back: Pain at mid throracic spine around T4-5. Pain with palpation. Some increased tightness in paraspinal muscles indicative of spasm or possible decreased motion around facet joint. This is to left side of midline at bra line and facets move minimally from T5 to T7  Decreased motion in this area with gentle spinal rock as compared to higher or lower areas on spine.  no scapular winging no worsening of sxs with abduction of arms overhead   Impression & Recommendations:  Problem # 1:  BACK PAIN, THORACIC REGION, CHRONIC (ICD-724.1) Recurrence of previous back pain. Instructed on resuming previous exercises as well as adding two new exercises detailed in the pt. instructions. Also advised to increase gabapentin to 2 tablets before bed, and told her she can try the meloxicam to see if that helps with pain relief. She should continue water aerobics but hold off on yoga  for now. She will return in 2 weeks for follow up. Her updated medication list for this problem includes:    Meloxicam 15 Mg Tabs (Meloxicam) ..... One by mouth daily  Complete Medication List: 1)  Synthroid 200 Mcg Tabs (Levothyroxine sodium) .... Take 1 tablet by mouth once a day 2)  Evista 60 Mg Tabs (Raloxifene hcl) .... Take 1 tablet by mouth once a day 3)  Avapro 75 Mg Tabs (Irbesartan) .... Take 1 tablet by mouth once a day 4)  Clonazepam Odt 0.25 Mg Tbdp (Clonazepam) .... Take 1 tablet by mouth once a day 5)  Norvasc 5 Mg Tabs (Amlodipine besylate) .... Take 1 tablet by mouth once a day 6)  Citalopram Hydrobromide 20 Mg Tabs (Citalopram hydrobromide) .... Take 1 tablet by mouth once a day 7)  Vitamin C Cr 500 Mg Cr-tabs (Ascorbic acid) .... Take 1 tablet by mouth once a day 8)  Caltrate 600+d 600-400 Mg-unit Tabs (Calcium carbonate-vitamin d) .... Take 1 tablet by mouth two times a day 9)  Cvs Stool Softener 100 Mg Caps (Docusate sodium) .... Take 1 tablet by mouth once a day 10)  Meloxicam 15 Mg Tabs (Meloxicam) .... One by mouth daily  Patient Instructions: 1)  do the 2 exercises on the sheet just as before - pull arms back fully 2)  then use pillow x wall and do count of 5 to 10 x 3 3)  then lie on floor and use pillow over  mid spine let arms come down to floor - 3 times 30 secs 4)  increase tghe gabapentin to 2 at night 5)  recheck after 2 weeks

## 2010-02-23 NOTE — Assessment & Plan Note (Signed)
Summary: F/U [facility]   Vital Signs:  Patient profile:   77 year old female BP sitting:   145 / 80  Vitals Entered By: Lillia Pauls CMA (May 28, 2009 10:24 AM)  History of Present Illness: She did have 5 days without pain during last week stopped gabapentin for 2 days and pain returned only got up to 200 at nite but did have less side effects  unable to do exercises for scap area as they trigger pain unable to do water aerobics OK on bike  walking feels better in knee brace we added heel lift  also has wedge in RT shoe  wears 3/4 length rigid orthotic with MT on left  Allergies: 1)  ! Septra  Physical Exam  General:  Well-developed,well-nourished,in no acute distress; alert,appropriate and cooperative throughout examination Msk:  hass full ROM of neck and shoulder compared to her baseline before  no weakness or pain on assessment today     Impression & Recommendations:  Problem # 1:  BACK PAIN, THORACIC REGION, CHRONIC (ICD-724.1) This still appears to be referred to me  Problem # 2:  DEGENERATIVE DISC DISEASE, CERVICAL SPINE, W/RADICULOPATHY (ICD-722.0) continue as per instructionss  we need to give a therapeutic trial of neurontin before deciding if effective  I think the 5 days of pain relief probably show that it can work  Problem # 3:  METATARSALGIA (ICD-726.70) this is helped by her cusotm orthotic  we may need to place MT pads in other shoes  Problem # 4:  DEGENERATIVE JOINT DISEASE, RIGHT KNEE (ICD-715.96) showed how to use brace in better position  Complete Medication List: 1)  Synthroid 200 Mcg Tabs (Levothyroxine sodium) .... Take 1 tablet by mouth once a day 2)  Evista 60 Mg Tabs (Raloxifene hcl) .... Take 1 tablet by mouth once a day 3)  Avapro 75 Mg Tabs (Irbesartan) .... Take 1 tablet by mouth once a day 4)  Clonazepam Odt 0.25 Mg Tbdp (Clonazepam) .... Take 1 tablet by mouth once a day 5)  Norvasc 5 Mg Tabs (Amlodipine besylate) ....  Take 1 tablet by mouth once a day 6)  Citalopram Hydrobromide 20 Mg Tabs (Citalopram hydrobromide) .... Take 1 tablet by mouth once a day 7)  Vitamin C Cr 500 Mg Cr-tabs (Ascorbic acid) .... Take 1 tablet by mouth once a day 8)  Caltrate 600+d 600-400 Mg-unit Tabs (Calcium carbonate-vitamin d) .... Take 1 tablet by mouth two times a day 9)  Cvs Stool Softener 100 Mg Caps (Docusate sodium) .... Take 1 tablet by mouth once a day  Patient Instructions: 1)  For the next week take: 2)  3 gabapentin between dinner and bedtime 3)  week 2: 4)  try 100 mgm at noon and still the 3 in the evening 5)  week 3 and 4: 6)  try 200 at noon and 300 mgm ion evening 7)  Try easy exercise in the morning - but do some arm motion to move the upper back and shoulder blades 8)  concentrate on posture 9)  use neck brace for rest 10)  use knee brace for standing 11)  reck with me in 1 month 12)  vitamin B6 get 100 mgm per day

## 2010-02-23 NOTE — Letter (Signed)
Summary: Generic Letter  [facility]  [address]   [place], [place] [address]   Phone: [phone]  Fax: [phone]    04/30/2009  To Whom It May Concern:  Today I evaluated my patient:  Michelle Sims [address] [place], [place]  [address]  Ms. Hardebeck has a cervical disk injury with radiculopathy.  She will be unable to travel or walk extensively indefinitely but expected at least 6 months.         Sincerely,      Enid Baas MD

## 2010-02-23 NOTE — Letter (Signed)
Summary: *Referral Letter  [facility]  [address]   [place], [place] [address]   Phone: [phone]  Fax: [phone]    06/03/2009 Shirlean Kelly, MD [facility] [address] [place], [address] [place], [place]  [address] Fax: [phone]   Dear Molly Maduro:   Thank you in advance for agreeing to see Ms. Darcey Nora whom you have seen in past and has seen me recently.  Teale Goodgame [address] [place], [place]  [address]  Phone: [phone]  Reason for Referral: She has had intense pain in periscapular region and has seen both Drs Sandria Manly and Thurston Hole without relief.  Injections were targeted to thoracic facet joints and did not help.  Dr. Thurston Hole sent her for my evaluation and I found some left scapular winging and what I think is a referred pain pattern consistent with either irritation of long thoracic or dorsal scapular nerve irritation.  She has not responded to collar and use of neurontin.  My review of her MRI is that she has fairly extensive cervical degenerative disk disease.  I wonder if this is primary cause of her problem and would like your opinion.  Procedures Requested: consider cervical injection.  Current Medical Problems: 1)  METATARSALGIA (ICD-726.70) 2)  DEGENERATIVE JOINT DISEASE, RIGHT KNEE (ICD-715.96) 3)  BACK PAIN, THORACIC REGION, CHRONIC (ICD-724.1) 4)  DEGENERATIVE DISC DISEASE, CERVICAL SPINE, W/RADICULOPATHY (ICD-722.0) 5)  THYROID CANCER, HX OF (ICD-V10.87) 6)  THYROIDECTOMY, HX OF (ICD-V45.79) 7)  CELLULITIS (ICD-682.9) 8)  MASTECTOMY, RIGHT, HX OF (ICD-V45.71) 9)  HYPERTENSION (ICD-401.9) 10)  DEPRESSION (ICD-311) 11)  BREAST CANCER, HX OF (ICD-V10.3) 12)  FATIGUE (ICD-780.79)   Current Medications: 1)  SYNTHROID 200 MCG TABS (LEVOTHYROXINE SODIUM) Take 1 tablet by mouth once a day 2)  EVISTA 60 MG TABS (RALOXIFENE HCL) Take 1 tablet by mouth once a day 3)  AVAPRO 75 MG TABS (IRBESARTAN) Take 1 tablet by  mouth once a day 4)  CLONAZEPAM ODT 0.25 MG TBDP (CLONAZEPAM) Take 1 tablet by mouth once a day 5)  NORVASC 5 MG TABS (AMLODIPINE BESYLATE) Take 1 tablet by mouth once a day 6)  CITALOPRAM HYDROBROMIDE 20 MG TABS (CITALOPRAM HYDROBROMIDE) Take 1 tablet by mouth once a day 7)  VITAMIN C CR 500 MG CR-TABS (ASCORBIC ACID) Take 1 tablet by mouth once a day 8)  CALTRATE 600+D 600-400 MG-UNIT TABS (CALCIUM CARBONATE-VITAMIN D) Take 1 tablet by mouth two times a day 9)  CVS STOOL SOFTENER 100 MG CAPS (DOCUSATE SODIUM) Take 1 tablet by mouth once a day 10)  HYDROCODONE-ACETAMINOPHEN 5-500 MG TABS (HYDROCODONE-ACETAMINOPHEN) 1 by mouth q6h as needed more severe pain   Past Medical History: 1)  Breast cancer, hx of 2)  Depression 3)  Hypertension   Thank you again for agreeing to see our patient; please contact us if you have any further questions or need additional information.  Sincerely,  Army Melia Dusty Raczkowski MD [phone]  Appended Document: *Referral Letter faxed notes and letter to [facility] and their office will call pt

## 2010-02-23 NOTE — Progress Notes (Signed)
  Phone Note Call from Patient   Caller: Patient Call For: [phone] Summary of Call: Mrs. Bow had sent an email to you and wanted to know why she hasn't heard back yet?  Shawna Orleans had given her the email address.  Please contact her either via email or her direct ph#.   Initial call taken by: Abundio Miu,  December 31, 2009 8:55 AM  Follow-up for Phone Call        Called pt back- left VM to return call. Follow-up by: Rochele Pages RN,  December 31, 2009 10:36 AM  Additional Follow-up for Phone Call Additional follow up Details #1::        Spoke with pt she states she is ready to increase to 3 gabapentin.  Also her pain is starting earlier in the day now wants advice from Dr. Darrick Penna about what to do.  Advised pt to start taking the gabapentin with breakfast as pain is starting first thing in the morning. Advised take 2nd in the afternoon, and 3rd at bedtime.  Discussed with Dr. Darrick Penna and he agrees with plan.  Pt is agreeable to start gabapentin with breakfast.  Additional Follow-up by: Rochele Pages RN,  December 31, 2009 11:22 AM

## 2010-02-23 NOTE — Assessment & Plan Note (Signed)
Summary: F/U [facility]   Vital Signs:  Patient profile:   77 year old female BP sitting:   153 / 79  Vitals Entered By: Lillia Pauls CMA (November 18, 2009 10:51 AM)  History of Present Illness: Patient returns for periscapular pain  she felt happy as this pain totally resolved from May 20 for next 4.5 mos. Then returned without specific reason  today back ext exercises w pillow feel good has not been using cerv collar much some pain relief w gabapentin  however, brings diary and by 2 of 3 days pain starts by mid to late afternoon few days pain free  std exercises hurt at times  Allergies: 1)  ! Septra  Physical Exam  General:  Well-developed,well-nourished,in no acute distress; alert,appropriate and cooperative throughout examination Msk:  neck forward position  has limited extension of neck  good shoulder flroward flexion and abduction no winging of scapula  Tender point at medial border distally left scapula no mm spasm noted though   Impression & Recommendations:  Problem # 1:  DEGENERATIVE DISC DISEASE, CERVICAL SPINE, W/RADICULOPATHY (ICD-722.0) see instructions will inc gabapentin and use collar more  Problem # 2:  BACK PAIN, THORACIC REGION, CHRONIC (ICD-724.1)  Her updated medication list for this problem includes:    Meloxicam 15 Mg Tabs (Meloxicam) ..... One by mouth daily - will stop  she gets better relief w intermittent ibuprofen careful with stretching to lift groceries as I think this triggers some of MM type pain she gets  reck in 1 mo  Complete Medication List: 1)  Synthroid 200 Mcg Tabs (Levothyroxine sodium) .... Take 1 tablet by mouth once a day 2)  Evista 60 Mg Tabs (Raloxifene hcl) .... Take 1 tablet by mouth once a day 3)  Avapro 75 Mg Tabs (Irbesartan) .... Take 1 tablet by mouth once a day 4)  Clonazepam Odt 0.25 Mg Tbdp (Clonazepam) .... Take 1 tablet by mouth once a day 5)  Norvasc 5 Mg Tabs (Amlodipine besylate) .... Take 1  tablet by mouth once a day 6)  Citalopram Hydrobromide 20 Mg Tabs (Citalopram hydrobromide) .... Take 1 tablet by mouth once a day 7)  Vitamin C Cr 500 Mg Cr-tabs (Ascorbic acid) .... Take 1 tablet by mouth once a day 8)  Caltrate 600+d 600-400 Mg-unit Tabs (Calcium carbonate-vitamin d) .... Take 1 tablet by mouth two times a day 9)  Cvs Stool Softener 100 Mg Caps (Docusate sodium) .... Take 1 tablet by mouth once a day 10)  Meloxicam 15 Mg Tabs (Meloxicam) .... One by mouth daily  Patient Instructions: 1)  Put neck brace on for 1 hour every 4 hours or so 2)  exercises 3)  continue back x pillow on wall 4)  continue lying with pillow over mid spine and do a few arm elevations 5)  2 exercises seated wtih pillow behind back 6)  robbery 7)  lawnmower 8)  do 6 repeats and do them twice ( 2 sets) 9)  try going up to 300 mgm of gabapentin daily if you can 10)  MOther says - GOOD Posture 11)  recheck after 1 month   Orders Added: 1)  Est. Patient Level III [54098]

## 2010-02-23 NOTE — Assessment & Plan Note (Signed)
Summary: F/U[facility]   Vital Signs:  Patient profile:   77 year old female BP sitting:   127 / 76  Vitals Entered By: Lillia Pauls CMA (August 10, 2009 10:03 AM)  History of Present Illness: Michelle Sims is much better regarding her upper back really no pain past 2 mos would like to wean gabapentin 2/2 tiredness  RT knee is still very painful at times would like to do some walking no swelling gets worse w steps like at beach  LOW BACK gets periodic painful flares would like to start some exercises  now back to some water aerobic exercise  Allergies: 1)  ! Septra  Physical Exam  General:  Well-developed,well-nourished,in no acute distress; alert,appropriate and cooperative throughout examination Msk:  upper back shows no spasm good motion no winging o scapula  neck ROM good flex/ ext and rotation limited on lat bend  RT kinee shows spurring and chronic DJD  no effusion today   Impression & Recommendations:  Problem # 1:  BACK PAIN, THORACIC REGION, CHRONIC (ICD-724.1) this has essentially resolved  want to carefully wean meds and keep up some of key parascapular exercises  Problem # 2:  DEGENERATIVE DISC DISEASE, CERVICAL SPINE, W/RADICULOPATHY (ICD-722.0) the gabapentin has really blocked sxs will wean gradually if tolerated  Problem # 3:  LUMBAGO (ICD-724.2) this is mild and chronic careful exercise program but do this in chair  reck 3 mos  Complete Medication List: 1)  Synthroid 200 Mcg Tabs (Levothyroxine sodium) .... Take 1 tablet by mouth once a day 2)  Evista 60 Mg Tabs (Raloxifene hcl) .... Take 1 tablet by mouth once a day 3)  Avapro 75 Mg Tabs (Irbesartan) .... Take 1 tablet by mouth once a day 4)  Clonazepam Odt 0.25 Mg Tbdp (Clonazepam) .... Take 1 tablet by mouth once a day 5)  Norvasc 5 Mg Tabs (Amlodipine besylate) .... Take 1 tablet by mouth once a day 6)  Citalopram Hydrobromide 20 Mg Tabs (Citalopram hydrobromide) .... Take 1 tablet by mouth once  a day 7)  Vitamin C Cr 500 Mg Cr-tabs (Ascorbic acid) .... Take 1 tablet by mouth once a day 8)  Caltrate 600+d 600-400 Mg-unit Tabs (Calcium carbonate-vitamin d) .... Take 1 tablet by mouth two times a day 9)  Cvs Stool Softener 100 Mg Caps (Docusate sodium) .... Take 1 tablet by mouth once a day  Patient Instructions: 1)  keep up some easy neck motion exercises 2)  keep up exercises for scapula 3)  OK to restart water aerobics 4)  gabapentin 200 mgm for 2 days and then on third day take 100 mgm for 9 days total 5)  then 200/ 100/ 100 for 9 days total 6)  Then 100 per day for 1 week 7)  then 100 every other day for 1 week 8)  then try to stop 9)  For RT knee 10)  use brace for walking 11)  OK to walk 2 to 3 x per week but keep distance short as it takes not to increase pain level 12)  biking is good rehab/ not necessary to use brace for bike 13)  we will do cortisone shot early august for RT knee 14)  if it flares stay on a bit longer at 200 and wean as you can 15)  Chair exercises 16)  knee to chest 17)  knee to shoulder 18)  low back x wall 19)  5 exercise / count to 10

## 2010-02-23 NOTE — Assessment & Plan Note (Signed)
Summary: F/U,[facility]   Vital Signs:  Patient profile:   77 year old female BP sitting:   134 / 83  Vitals Entered By: Lillia Pauls CMA (June 25, 2009 9:23 AM)  History of Present Illness: Now no pain in mid back x 2 weeks has cut gabapentin to 200 once daily main prob is feels fatigued but also is doing more  also takes a number of other meds as listed that could have effect  labs checked by Dr gates were normal x Na of 135  key thing she notes is not a specific side effect but more general fatigue interstingly this is not after taking the gabapentin she takes that at 4 PM and feels her best at 7 PM when gabapentin would actually be at close to max blood level one other factor is that with lack of back pain past 2 weeks - she did water aerobics; took trip to [place]; generally been more active  Allergies: 1)  ! Septra  Physical Exam  General:  Well-developed,well-nourished,in no acute distress; alert,appropriate and cooperative throughout examination Msk:  today is able to do full thoracic back motion pain free can do shoulder blade motion with ER lawnmower motion shoulder shrugs all without pain   Impression & Recommendations:  Problem # 1:  BACK PAIN, THORACIC REGION, CHRONIC (ICD-724.1) Assessment Improved  Her updated medication list for this problem includes:    Hydrocodone-acetaminophen 5-500 Mg Tabs (Hydrocodone-acetaminophen) .Marland Kitchen... 1 by mouth q6h as needed more severe pain  This is first progress in a long time for her pain level  strongly encouraged not to stop gabapentin as I think this has been effective but if sideeffects are related they will gradually dissipate  Problem # 2:  DEGENERATIVE DISC DISEASE, CERVICAL SPINE, W/RADICULOPATHY (ICD-722.0) I think this is the key trigger for radiculopathy prob C4 -C6 level of CX spine with radicular sxs to paraspinous MM areas  will gradually inc actiivty  still avoid any lifting > 10lbs  I want to reck in 6  wks keep up B6 100 qd  Complete Medication List: 1)  Synthroid 200 Mcg Tabs (Levothyroxine sodium) .... Take 1 tablet by mouth once a day 2)  Evista 60 Mg Tabs (Raloxifene hcl) .... Take 1 tablet by mouth once a day 3)  Avapro 75 Mg Tabs (Irbesartan) .... Take 1 tablet by mouth once a day 4)  Clonazepam Odt 0.25 Mg Tbdp (Clonazepam) .... Take 1 tablet by mouth once a day 5)  Norvasc 5 Mg Tabs (Amlodipine besylate) .... Take 1 tablet by mouth once a day 6)  Citalopram Hydrobromide 20 Mg Tabs (Citalopram hydrobromide) .... Take 1 tablet by mouth once a day 7)  Vitamin C Cr 500 Mg Cr-tabs (Ascorbic acid) .... Take 1 tablet by mouth once a day 8)  Caltrate 600+d 600-400 Mg-unit Tabs (Calcium carbonate-vitamin d) .... Take 1 tablet by mouth two times a day 9)  Cvs Stool Softener 100 Mg Caps (Docusate sodium) .... Take 1 tablet by mouth once a day 10)  Hydrocodone-acetaminophen 5-500 Mg Tabs (Hydrocodone-acetaminophen) .Marland Kitchen.. 1 by mouth q6h as needed more severe pain  Patient Instructions: 1)  continue gabapentin at least once daily 2)  you can vary the time of day 3)  OK to do water aerobics 4)  do motion exercises for arms but no weight 5)  try to do some aerobic - walking - pool - short term - 5 to 10 minutes several times per day 6)  discuss with  Dr Kevan Ny whether any medicines can be lowered 7)  5 BP at our office ahve been 140/80 range or less

## 2010-02-25 NOTE — Assessment & Plan Note (Signed)
Summary: F/U [facility]   Vital Signs:  Patient profile:   77 year old female Height:      63 inches Weight:      135 pounds BMI:     24.00 BP sitting:   142 / 84  Vitals Entered By: Lillia Pauls CMA (February 04, 2010 11:29 AM)  CC:  f/u back pain and neck pain.  History of Present Illness: 77yo female to office for f/u of back pain & neck pain. Pain relatively unchanged since last evaluation.  Currently taking neurontin 500mg  daily (100mg  q AM, 100mg  q mid-day, & 300mg  q hs), no improvement in sypmtoms.  Denies any adverse side effects. Was doing home exercises regularly, but stopped b/c were not helping. Neck pain very prominent after sitting at computer for over 15-mins.  Tried neck brace, but does not help. Increased pain with standing for long periods of time. Denies numbness/tingling of arms or legs.  Denies weakness. Stopped taking naproxen b/c of red spots on her arms.  Allergies: 1)  ! Septra  Physical Exam  General:  Well-developed,well-nourished,in no acute distress; alert,appropriate and cooperative throughout examination Msk:  NECK: neck held in forward flexed position by 10-deg.  ROM limited, unchanged from previous evaluations.  Mild TTP along paraspinal muscles b/l, no midline tenderness.  BACK: TTP along paraspinal muscles on thoracic spine, no midline tenderness.  Mild spasm of rhomboids b/l - L>R.   L-spine with no midline or paraspinal tenderness.  Decreased ROM.  Neg SLR b/l.  SHOULDERS: good ROM, some pain with abduction.  good RTC strength. Pulses:  +2/4 radial b/l Neurologic:  sensation intact to light touch.   DTR +2/4 bicep, tricep, brachioradialis; +2/4 achilles, patella b/l   Impression & Recommendations:  Problem # 1:  BACK PAIN, THORACIC REGION, CHRONIC (ICD-724.1) - Cont. to titrate up dose of neurontin to goal of 300mg  three times a day.  Will increase slowly, 1-tab per week over next 4 weeks.  Should call if having any adverse side effects. -  Cont HEP - shown stretches she can do in a chair - Cont. water exercises - f/u 6-weeks for re-evaluation  Problem # 2:  DEGENERATIVE DISC DISEASE, CERVICAL SPINE, W/RADICULOPATHY (ICD-722.0) - Titrate neurontin dose upward as stated above - Cont. HEP - may cont. to use neck brace prn - f/u 6-weeks for re-evaluation  Complete Medication List: 1)  Synthroid 200 Mcg Tabs (Levothyroxine sodium) .... Take 1 tablet by mouth once a day 2)  Evista 60 Mg Tabs (Raloxifene hcl) .... Take 1 tablet by mouth once a day 3)  Clonazepam Odt 0.25 Mg Tbdp (Clonazepam) .... Take 1 tablet by mouth once a day 4)  Norvasc 5 Mg Tabs (Amlodipine besylate) .... Take 1 tablet by mouth once a day 5)  Citalopram Hydrobromide 20 Mg Tabs (Citalopram hydrobromide) .... Take 1 tablet by mouth once a day 6)  Vitamin C Cr 500 Mg Cr-tabs (Ascorbic acid) .... Take 1 tablet by mouth once a day 7)  Caltrate 600+d 600-400 Mg-unit Tabs (Calcium carbonate-vitamin d) .... Take 1 tablet by mouth two times a day 8)  Cvs Stool Softener 100 Mg Caps (Docusate sodium) .... Take 1 tablet by mouth once a day 9)  Diovan 160 Mg Tabs (Valsartan) .... 1/2 tab qd 10)  Gabapentin 100 Mg Caps (Gabapentin) .Marland Kitchen.. 1 by mouth qid; 1 pill in am; 1 pill in afternoon; 2 pills at bedtime; may increase to 3 pills at bedtime if symptoms not improved in 2 weeks  11)  Ketaprofen Gel  .... Apply to affected area four times daily  Patient Instructions: 1)  Increase gapabentin to 100mg  1-tab in the morning, 100mg  2 tabs mid-day, 100mg  3-tabs at bedtime.  Do this x 1 week then --> 2)  Start gabapentin 100mg  2-tab in the morning, 100mg  2-tab mid-day, 100mg  3-tabs at bedtime.  Do this x 1 week then -->  3)  Start gabapentin 100mg  2-tab in the morning, 100mg  3-tabs mid-day, then 100mg  3-tabs at bedtime.  Do this x 1-week then --> 4)  Gabapentin 100mg  3-tab in the morning, 100mg  3-tabs mid-day, 100mg  3-tabs at bedtime. 5)  Call if experiencing any side effects or if  having problems with medication. 6)  Do back exercises/stretches while sitting in a chair - pulling your knee to your chest. 7)  Continue exercises with pillow under your upper back to help with stretching. 8)  Call in 2-weeks to schedule appt for 4-weeks later.  Goal is to have you follow-up after on gapapentin 300mg  3-times a day.   Orders Added: 1)  Est. Patient Level III [62952]

## 2010-02-25 NOTE — Assessment & Plan Note (Signed)
Summary: FU BACK APPT/MJD   Vital Signs:  Patient profile:   77 year old female Pulse rate:   67 / minute BP sitting:   136 / 84  (right arm)  Vitals Entered By: Rochele Pages RN (January 07, 2010 11:19 AM) CC: f/u back and neck pain   CC:  f/u back and neck pain.  History of Present Illness: Pt reports minimal improvement in back and neck pain since last clinical visit. Pt states that neck pain continues with independent movement. Is also having intermittent radiating down L paraspinal area. Neurontin has somewhat helped. However, pt is concerned that neurontin will make her too drowsy as she has seen a neurologist in the past that gave her approx 300mg  of neurontin nightly with reported significant drowsiness. Pt feels that slow titration of neurontin currently has been tolerable. Currenlty on neurontin 100mg  three times a day. has been doing exercises daily. Including water aerobics which pt feels has helped with range of motion.   Allergies: 1)  ! Septra  Physical Exam  General:  alert. NAD Msk:  some minimal spasm over LT parascapulr mm at rhomboids tenderness to palpation over L scapular paraspinal area  + minimal  pain on elevation of left arm over head and on full ext rotation full ROM some pain but no weakness on drop arm left  neck is in forward flexed position by 10 deg ROM unchanged from before with some limits   Impression & Recommendations:  Problem # 1:  BACK PAIN, THORACIC REGION, CHRONIC (ICD-724.1) Plan to slowly titrate neurontin to 400mg  daily for improved pain control. Pt encouraged to continue water exercises for mobility and ROM. WIll also rx topical ketoprofen gel to apply to affected areas. Plan to followup in 1 month.   Complete Medication List: 1)  Synthroid 200 Mcg Tabs (Levothyroxine sodium) .... Take 1 tablet by mouth once a day 2)  Evista 60 Mg Tabs (Raloxifene hcl) .... Take 1 tablet by mouth once a day 3)  Clonazepam Odt 0.25 Mg Tbdp  (Clonazepam) .... Take 1 tablet by mouth once a day 4)  Norvasc 5 Mg Tabs (Amlodipine besylate) .... Take 1 tablet by mouth once a day 5)  Citalopram Hydrobromide 20 Mg Tabs (Citalopram hydrobromide) .... Take 1 tablet by mouth once a day 6)  Vitamin C Cr 500 Mg Cr-tabs (Ascorbic acid) .... Take 1 tablet by mouth once a day 7)  Caltrate 600+d 600-400 Mg-unit Tabs (Calcium carbonate-vitamin d) .... Take 1 tablet by mouth two times a day 8)  Cvs Stool Softener 100 Mg Caps (Docusate sodium) .... Take 1 tablet by mouth once a day 9)  Diovan 160 Mg Tabs (Valsartan) .... 1/2 tab qd 10)  Gabapentin 100 Mg Caps (Gabapentin) .Marland Kitchen.. 1 by mouth qid; 1 pill in am; 1 pill in afternoon; 2 pills at bedtime; may increase to 3 pills at bedtime if symptoms not improved in 2 weeks 11)  Ketaprofen Gel  .... Apply to affected area four times daily  Patient Instructions: 1)  Take 1 regular strength aleve twice daily  2)  Increase the nighty dose of neurontin to 2 pills at night. 3)  You may increase your neurontin dose to 3 pills at night in 2 weeks if your pain is not improved 4)  Continue taking 1 pill in am and 1 pill in afternoon 5)  Hold off on the arm exercises that are worsening your shoulder pain  6)  Continue with water exercises 7)  Followup  in 1 month  8)  Call with any questions  9)  Merry Christmas 10)  Enid Baas MD  Prescriptions: Madaline Savage GEL apply to affected area four times daily  #1 tube qs x 1   Entered by:   Doree Albee MD   Authorized by:   Enid Baas MD   Signed by:   Doree Albee MD on 01/07/2010   Method used:   Print then Give to Patient   RxID:   [id number]    Orders Added: 1)  Est. Patient Level III [14782]

## 2010-02-25 NOTE — Assessment & Plan Note (Signed)
Summary: OV CONTINUED PAIN/MJD   Vital Signs:  Patient profile:   77 year old female Pulse rate:   76 / minute BP sitting:   143 / 86  (left arm)  Vitals Entered By: Enid Baas MD (February 17, 2010 11:37 AM)  CC:  neck pain.  History of Present Illness: Ms Quant comes today for a work in visit for her neck pain.  Her thoroacic back pain has worsened since her last visit. She is currently mid way through a gabapentin up taper.  She has not been able to complete her home PT exercises due to shoulder pain.   Additionally she noted midline upper thoracic and lower cervical pain. The pain radiates to her trapezous in her left.  She does not have inciting motions or exercises that cause neck pain. Pain is off and on.  She is concerned that her pain is due to brain tumors as her mother had tumors.  She denies any radicular symptoms. Additionally she denies any weakness or numbness. She does note that her handwriting has deteroiated.   Current Problems (verified): 1)  Neck Pain  (ICD-723.1) 2)  Shoulder Pain, Left  (ICD-719.41) 3)  Hip Pain, Left  (ICD-719.45) 4)  Lumbago  (ICD-724.2) 5)  Metatarsalgia  (ICD-726.70) 6)  Degenerative Joint Disease, Right Knee  (ICD-715.96) 7)  Back Pain, Thoracic Region, Chronic  (ICD-724.1) 8)  Degenerative Disc Disease, Cervical Spine, W/radiculopathy  (ICD-722.0) 9)  Thyroid Cancer, Hx of  (ICD-V10.87) 10)  Thyroidectomy, Hx of  (ICD-V45.79) 11)  Cellulitis  (ICD-682.9) 12)  Mastectomy, Right, Hx of  (ICD-V45.71) 13)  Hypertension  (ICD-401.9) 14)  Depression  (ICD-311) 15)  Breast Cancer, Hx of  (ICD-V10.3) 16)  Fatigue  (ICD-780.79)  Current Medications (verified): 1)  Synthroid 200 Mcg Tabs (Levothyroxine Sodium) .... Take 1 Tablet By Mouth Once A Day 2)  Evista 60 Mg Tabs (Raloxifene Hcl) .... Take 1 Tablet By Mouth Once A Day 3)  Clonazepam Odt 0.25 Mg Tbdp (Clonazepam) .... Take 1 Tablet By Mouth Once A Day 4)  Norvasc 5 Mg Tabs  (Amlodipine Besylate) .... Take 1 Tablet By Mouth Once A Day 5)  Citalopram Hydrobromide 20 Mg Tabs (Citalopram Hydrobromide) .... Take 1 Tablet By Mouth Once A Day 6)  Vitamin C Cr 500 Mg Cr-Tabs (Ascorbic Acid) .... Take 1 Tablet By Mouth Once A Day 7)  Caltrate 600+d 600-400 Mg-Unit Tabs (Calcium Carbonate-Vitamin D) .... Take 1 Tablet By Mouth Two Times A Day 8)  Cvs Stool Softener 100 Mg Caps (Docusate Sodium) .... Take 1 Tablet By Mouth Once A Day 9)  Diovan 160 Mg Tabs (Valsartan) .... 1/2 Tab Qd 10)  Gabapentin 100 Mg Caps (Gabapentin) .Marland Kitchen.. 1 By Mouth Qid; 1 Pill in Am; 1 Pill in Afternoon; 2 Pills At Bedtime; May Increase To 3 Pills At Bedtime If Symptoms Not Improved in 2 Weeks 11)  Ketaprofen Gel .... Apply To Affected Area Four Times Daily  Allergies (verified): 1)  ! Septra  Past History:  Past Medical History: Last updated: 11/13/2007 Breast cancer, hx of Depression Hypertension  Review of Systems  The patient denies anorexia, fever, and weight loss.    Physical Exam  General:  alert. NAD Msk:  Neck: TTP over C7/T1 area.  Neck limitied extension, and normal flexion. Rotation slightly limited.  TTP over left paraspinus muscles in thoracic back.    Neurologic:  Hands: Sensation is intact as is strength of intrensic muscles of hands. wrist strength is normal.  Coordination is intact.    Impression & Recommendations:  Problem # 1:  NECK PAIN (ICD-723.1) Assessment New Midline pain is concerning for bony tumor given hx breast ca, vs osteoperosis fx, vs arthritis.  Will obtain 3 view of cervical spine.  Will follow up with plan based on x-ray findings.  Orders: Diagnostic X-Ray/Fluoroscopy (Diagnostic X-Ray/Flu)  Note review of Xrays reveals extensive change in Cx spine that probably trigger her sxs no fx or mets noted  Please see Xrays as noted on reports  Problem # 2:  BACK PAIN, THORACIC REGION, CHRONIC (ICD-724.1) Assessment: Deteriorated Pain  worsened in thoracic back.  Will again x-ray the thoracic back with a ap and lat.  Will follow up as above.  Also increase gabapentin per protical.  Orders: Diagnostic X-Ray/Fluoroscopy (Diagnostic X-Ray/Flu)  this area o fback actually looks pretty normal for age  suggests more pain radiating from neck down to back  will keep on gabapentin and monitor for change  Complete Medication List: 1)  Synthroid 200 Mcg Tabs (Levothyroxine sodium) .... Take 1 tablet by mouth once a day 2)  Evista 60 Mg Tabs (Raloxifene hcl) .... Take 1 tablet by mouth once a day 3)  Clonazepam Odt 0.25 Mg Tbdp (Clonazepam) .... Take 1 tablet by mouth once a day 4)  Norvasc 5 Mg Tabs (Amlodipine besylate) .... Take 1 tablet by mouth once a day 5)  Citalopram Hydrobromide 20 Mg Tabs (Citalopram hydrobromide) .... Take 1 tablet by mouth once a day 6)  Vitamin C Cr 500 Mg Cr-tabs (Ascorbic acid) .... Take 1 tablet by mouth once a day 7)  Caltrate 600+d 600-400 Mg-unit Tabs (Calcium carbonate-vitamin d) .... Take 1 tablet by mouth two times a day 8)  Cvs Stool Softener 100 Mg Caps (Docusate sodium) .... Take 1 tablet by mouth once a day 9)  Diovan 160 Mg Tabs (Valsartan) .... 1/2 tab qd 10)  Gabapentin 100 Mg Caps (Gabapentin) .Marland Kitchen.. 1 by mouth qid; 1 pill in am; 1 pill in afternoon; 2 pills at bedtime; may increase to 3 pills at bedtime if symptoms not improved in 2 weeks 11)  Ketaprofen Gel  .... Apply to affected area four times daily  Patient Instructions: 1)  Thank you for seeing me today. 2)  Please continue the gabapentin protocal.  3)  We will call you with your X-rays.  4)  Continue shoulder motion exercises.  5)  Let us know how things go.  6)  We will arrange follow up based on the x-ray results.    Orders Added: 1)  Diagnostic X-Ray/Fluoroscopy [Diagnostic X-Ray/Flu] 2)  Est. Patient Level IV [16109]

## 2010-02-26 NOTE — Consult Note (Signed)
Summary: [facility]  [facility]   Imported By: Knox Royalty 05/13/2009 15:50:59  _____________________________________________________________________  External Attachment:    Type:   Image     Comment:   External Document

## 2010-03-02 ENCOUNTER — Ambulatory Visit: Payer: Self-pay

## 2010-03-02 ENCOUNTER — Ambulatory Visit
Admission: RE | Admit: 2010-03-02 | Discharge: 2010-03-02 | Disposition: A | Discharge status: Home / Self Care | Payer: BC Managed Care – PPO | Source: Ambulatory Visit | Attending: Internal Medicine | Admitting: Internal Medicine

## 2010-03-02 DIAGNOSIS — Z1239 Encounter for other screening for malignant neoplasm of breast: Secondary | ICD-10-CM

## 2010-03-08 ENCOUNTER — Encounter: Payer: Self-pay | Admitting: Sports Medicine

## 2010-03-08 ENCOUNTER — Ambulatory Visit (INDEPENDENT_AMBULATORY_CARE_PROVIDER_SITE_OTHER): Payer: BC Managed Care – PPO | Admitting: Sports Medicine

## 2010-03-08 DIAGNOSIS — M542 Cervicalgia: Secondary | ICD-10-CM

## 2010-03-08 DIAGNOSIS — M5020 Other cervical disc displacement, unspecified cervical region: Secondary | ICD-10-CM

## 2010-03-11 ENCOUNTER — Other Ambulatory Visit (HOSPITAL_COMMUNITY)
Admission: RE | Admit: 2010-03-11 | Discharge: 2010-03-11 | Disposition: A | Discharge status: Home / Self Care | Payer: BC Managed Care – PPO | Source: Ambulatory Visit | Attending: Obstetrics and Gynecology | Admitting: Obstetrics and Gynecology

## 2010-03-11 ENCOUNTER — Encounter (INDEPENDENT_AMBULATORY_CARE_PROVIDER_SITE_OTHER): Payer: BC Managed Care – PPO | Admitting: Obstetrics and Gynecology

## 2010-03-11 ENCOUNTER — Other Ambulatory Visit: Payer: Self-pay | Admitting: Obstetrics and Gynecology

## 2010-03-11 DIAGNOSIS — Z01419 Encounter for gynecological examination (general) (routine) without abnormal findings: Secondary | ICD-10-CM

## 2010-03-11 DIAGNOSIS — Z124 Encounter for screening for malignant neoplasm of cervix: Secondary | ICD-10-CM | POA: Insufficient documentation

## 2010-03-17 NOTE — Assessment & Plan Note (Signed)
Summary: post xray,mc   Vital Signs:  Patient profile:   77 year old female BP sitting:   131 / 80  Vitals Entered By: Lillia Pauls CMA (March 08, 2010 9:30 AM)  History of Present Illness: Using cervical collar when having neck pain, this is slightly helpful. Taking 900 gabapentin, not improving, pain at base of the neck worse than thoracic spine. When in pain- sitting in a comfortable chair with cushion or lying down temporarily relieves pain.   exercises now tend to cause neck pain mid back hurts more toward afternoon    Allergies: 1)  ! Septra  Physical Exam  General:  Well-developed,well-nourished,in no acute distress; alert,appropriate and cooperative throughout examination Msk:  10 degree flexion contracture of neck only gets 10 degrees of extension, can correct with chin tuck 30 degrees rotation to rt and lt 20 degrees lateral bend to rt, 15 to lt rolled forward shoulder position No scapular winging No neurologic weakness C5-T1 Good reflexes bilat upper extremities    Impression & Recommendations:  Problem # 1:  NECK PAIN (ICD-723.1)  Her updated medication list for this problem includes:    Tramadol Hcl 50 Mg Tabs (Tramadol hcl) .Marland Kitchen... 1 by mouth three times a day  will push pain med if tolerated and use collar prn  Problem # 2:  DEGENERATIVE DISC DISEASE, CERVICAL SPINE, W/RADICULOPATHY (ICD-722.0) not much relief with gabapentin so will wean this off   add tramadol  reck 1 month and may add muscle relaxant at night  Complete Medication List: 1)  Synthroid 200 Mcg Tabs (Levothyroxine sodium) .... Take 1 tablet by mouth once a day 2)  Evista 60 Mg Tabs (Raloxifene hcl) .... Take 1 tablet by mouth once a day 3)  Clonazepam Odt 0.25 Mg Tbdp (Clonazepam) .... Take 1 tablet by mouth once a day 4)  Norvasc 5 Mg Tabs (Amlodipine besylate) .... Take 1 tablet by mouth once a day 5)  Citalopram Hydrobromide 20 Mg Tabs (Citalopram hydrobromide) .... Take 1  tablet by mouth once a day 6)  Vitamin C Cr 500 Mg Cr-tabs (Ascorbic acid) .... Take 1 tablet by mouth once a day 7)  Caltrate 600+d 600-400 Mg-unit Tabs (Calcium carbonate-vitamin d) .... Take 1 tablet by mouth two times a day 8)  Cvs Stool Softener 100 Mg Caps (Docusate sodium) .... Take 1 tablet by mouth once a day 9)  Diovan 160 Mg Tabs (Valsartan) .... 1/2 tab qd 10)  Gabapentin 100 Mg Caps (Gabapentin) .Marland Kitchen.. 1 by mouth qid; 1 pill in am; 1 pill in afternoon; 2 pills at bedtime; may increase to 3 pills at bedtime if symptoms not improved in 2 weeks 11)  Ketaprofen Gel  .... Apply to affected area four times daily 12)  Tramadol Hcl 50 Mg Tabs (Tramadol hcl) .Marland Kitchen.. 1 by mouth three times a day  Patient Instructions: 1)  Do chin tucks - hold for 10 seconds do this 3 times in a row- 4-5 times a day. 2)  Lie down on bed and stretch your arms out to the sides and stretch your neck out for 30 seconds- do this 3 times in a row, and 3 separate times during the day. 3)  Sit in a chair and use a light weight and bring your shoulder back behind you- do both shoulders.  4)  Can continue water aerobics   5)  Can start tramadol - take 1 in the morning, and one around noon- can take an additional 1 around 6  pm if needed.  Take with plenty of water.  6)  Decrease gabapentin to 300 mg in the morning and 300 mg at night for 1 week.  During the 2nd week take 300 mg only at night.  The 3rd week 200 mg at night.   Continue on 200 mg at night until your next appointment with Dr. Darrick Penna in 4 weeks. Prescriptions: TRAMADOL HCL 50 MG TABS (TRAMADOL HCL) 1 by mouth three times a day  #90 x 2   Entered and Authorized by:   Enid Baas MD   Signed by:   Enid Baas MD on 03/08/2010   Method used:   Electronically to        [facility]  [phone]* (retail)       [address] [place], [place]  [address]       Ph: [phone] or [phone]       Fax: [phone]   RxID:   [id number]    Orders  Added: 1)  Est. Patient Level III [36644]

## 2010-04-07 ENCOUNTER — Ambulatory Visit (INDEPENDENT_AMBULATORY_CARE_PROVIDER_SITE_OTHER): Payer: BC Managed Care – PPO | Admitting: Sports Medicine

## 2010-04-07 ENCOUNTER — Encounter: Payer: Self-pay | Admitting: Sports Medicine

## 2010-04-07 DIAGNOSIS — M1710 Unilateral primary osteoarthritis, unspecified knee: Secondary | ICD-10-CM

## 2010-04-07 DIAGNOSIS — IMO0002 Reserved for concepts with insufficient information to code with codable children: Secondary | ICD-10-CM

## 2010-04-07 DIAGNOSIS — M5020 Other cervical disc displacement, unspecified cervical region: Secondary | ICD-10-CM

## 2010-04-07 DIAGNOSIS — M546 Pain in thoracic spine: Secondary | ICD-10-CM

## 2010-04-13 NOTE — Assessment & Plan Note (Signed)
Summary: f/u,mc   Vital Signs:  Patient profile:   77 year old female Pulse rate:   80 / minute BP sitting:   138 / 89  (left arm)  Vitals Entered By: Rochele Pages RN (April 07, 2010 11:38 AM)  History of Present Illness: Pt here to follow up on back/neck pain. Back pain improved with tramadol, does not feel this pain at all now.   Only has slight neck pain with significant standing- wears cervical colar- this helps. Feels that tramadol makes her very tired, weak, and depressed.  Per [name] stopped tramadol last tuesday- still feels tired and weak.  Weaned off gabapentin also.     question she as is since her fatigue is still present even after changing medications this is really related to the medication she has taken over to some other medical condition. Note she has been treated by Dr. Dagoberto Ligas with Synthroid 200 micrograms for a period of time and by report her TSH levels are normal. In her visits to [name] she has not shown signs of anemia by her report.  Continues to go to water aerobics- not using arm weights.   Allergies: 1)  ! Septra  Physical Exam  General:  Well-developed,well-nourished,in no acute distress; alert,appropriate and cooperative throughout examination Msk:  Limited neck extension, slight limitation flexion Rotation to rt and lt limited to 30 degrees Lateral bend to rt 20 degrees, and lt 15 degrees No trapezius muscle spasm No scapular winging  No drop arm sign Good abduction strength bilat shoulders Good ER and IR strength bilat shoulders Neg empty can Back extension 20 degrees 80 degrees forward flexion of back Lateral bending normal bilat Wall push up without difficulty  Rt lateral tibial impingement on knee extension DJD changes   Impression & Recommendations:  Problem # 1:  DEGENERATIVE DISC DISEASE, CERVICAL SPINE, W/RADICULOPATHY (ICD-722.0) Assessment Improved   this is clearly improved. I would use the tramadol only when she has  pain or symptoms that seem to be related to her neck condition. She can use the cervical collar when needed.  Problem # 2:  BACK PAIN, THORACIC REGION, CHRONIC (ICD-724.1)  Her updated medication list for this problem includes:    Tramadol Hcl 50 Mg Tabs (Tramadol hcl) .Marland Kitchen... 1 by mouth three times a day   this is improved as well and I think she can due to easy motion exercises as we prescribed for her upper trunk. We will also let her use occasional tramadol.  I will recheck her musculoskeletal issues in about 3 months. Leave  the additional workup of her fatigue to [name] who does her primary care.  Problem # 3:  DEGENERATIVE JOINT DISEASE, RIGHT KNEE (ICD-715.96)  Her updated medication list for this problem includes:    Tramadol Hcl 50 Mg Tabs (Tramadol hcl) .Marland Kitchen... 1 by mouth three times a day   there is more locking and impingement on the right lateral tibial plateau than before. use knee support when walking. At some point she may need to see the orthopedic surgeon for consideration should she want to do a lot more walking.   I am happy to see her as needed and I do think she benefits frontal and periodic reassurance and help with her exercise program.  Complete Medication List: 1)  Synthroid 200 Mcg Tabs (Levothyroxine sodium) .... Take 1 tablet by mouth once a day 2)  Evista 60 Mg Tabs (Raloxifene hcl) .... Take 1 tablet by mouth once a  day 3)  Clonazepam Odt 0.25 Mg Tbdp (Clonazepam) .... Take 1 tablet by mouth once a day 4)  Norvasc 5 Mg Tabs (Amlodipine besylate) .... Take 1 tablet by mouth once a day 5)  Citalopram Hydrobromide 20 Mg Tabs (Citalopram hydrobromide) .... Take 1 tablet by mouth once a day 6)  Vitamin C Cr 500 Mg Cr-tabs (Ascorbic acid) .... Take 1 tablet by mouth once a day 7)  Caltrate 600+d 600-400 Mg-unit Tabs (Calcium carbonate-vitamin d) .... Take 1 tablet by mouth two times a day 8)  Cvs Stool Softener 100 Mg Caps (Docusate sodium) .... Take 1 tablet by  mouth once a day 9)  Diovan 160 Mg Tabs (Valsartan) .... 1/2 tab qd 10)  Ketaprofen Gel  .... Apply to affected area four times daily 11)  Tramadol Hcl 50 Mg Tabs (Tramadol hcl) .Marland Kitchen.. 1 by mouth three times a day  Patient Instructions: 1)  Do not start using arm weights in water aerobics until your back has not been hurting for 6 weeks 2)  Continue water aerobics  3)  Glucosamine- 750mg /chondroitin 600 mg take 1 pill twice daily- this may help your knee    Orders Added: 1)  Est. Patient Level IV [29937]

## 2010-04-16 ENCOUNTER — Telehealth: Payer: Self-pay | Admitting: *Deleted

## 2010-04-16 NOTE — Telephone Encounter (Signed)
Spoke with Michelle Sims- advised Dr Darrick Penna generally suggests keeping pain level at no more than 3/10.  She states her pain gets more significant the longer she walks- no significant pain until walking more than 10 minutes.  Advised try walking 10 minutes twice a day - because cumulative time during the day is most important for her.  Michelle Sims agreeable.

## 2010-04-16 NOTE — Telephone Encounter (Signed)
Message copied by Resa Miner on Fri Apr 16, 2010  9:39 AM ------      Message from: Marily Memos      Created: Thu Apr 15, 2010  4:22 PM      Regarding: knee questions      Contact: [phone]       Patient would like to knowHow much pain should she tolerate with her knee. She wants to exercise but doesn't want to push it.

## 2010-05-20 ENCOUNTER — Encounter: Payer: Self-pay | Admitting: Sports Medicine

## 2010-05-21 ENCOUNTER — Ambulatory Visit (INDEPENDENT_AMBULATORY_CARE_PROVIDER_SITE_OTHER): Payer: BC Managed Care – PPO | Admitting: Sports Medicine

## 2010-05-21 ENCOUNTER — Encounter: Payer: Self-pay | Admitting: Sports Medicine

## 2010-05-21 VITALS — BP 146/80

## 2010-05-21 DIAGNOSIS — M1710 Unilateral primary osteoarthritis, unspecified knee: Secondary | ICD-10-CM

## 2010-05-21 DIAGNOSIS — M25519 Pain in unspecified shoulder: Secondary | ICD-10-CM

## 2010-05-21 DIAGNOSIS — M546 Pain in thoracic spine: Secondary | ICD-10-CM

## 2010-05-21 DIAGNOSIS — IMO0002 Reserved for concepts with insufficient information to code with codable children: Secondary | ICD-10-CM

## 2010-05-21 NOTE — Assessment & Plan Note (Signed)
This is much improved and for the meantime she can eliminate some of the upper back exercises and continue with her water aerobics classes.

## 2010-05-21 NOTE — Patient Instructions (Addendum)
Use 1 lb weights to do swinging, up and down in front of you, to the side, and external rotations-chicken arms In seated position do knee to chest stretch - 3 times for 5 seconds Bend forward in a seated position - hold 5 seconds repeat 3 times Push low back against chair- hold 5 seconds - repeat 3 times Straight leg and bent leg raises in seated position do these 8-10 times Lift knee up off chair with leg bent in seated position do these 8-10 times Ok to take 1/2 tablet of tramadol as needed  Return for follow up in 2 months

## 2010-05-21 NOTE — Assessment & Plan Note (Signed)
We placed some additional padding along the medial arch and the medial heel of her orthotics on the right leg. This has more comfortable for her when walking.  see the patient instructions for a series of rehabilitation exercises and I did encourage her to use at least a half a tablet tramadol 2-3 times a day to lessen somewhat pain in the right knee.

## 2010-05-21 NOTE — Progress Notes (Signed)
  Subjective:    Patient ID: Michelle Sims, female    DOB: [birth year], 77 y.o.   MRN: [id number]  HPI  Pt presents to clinic for f/u of fatigue and R knee is still painful. Fatigue is still present Had labs drawn - Free T4 was 1.39- synthroid was changed to 0.175 mcg  R knee painful and has crepitation- doing some rehab exercises at home. Doing water aerobics.  Lt upper buttock pain- worse first thing in the morning, feels better after moving around.  Low back pain with increased standing in kitchen  Upper back pain- no pain in several weeks.  Has stopped tramadol.     Review of Systems     Objective:   Physical Exam     Painful arc on lt shoulder at 120 deg elevation Equal IR, but pain on lt Pos impingement signs with empty can and hawkins on lt Good basic IR and ER strength Pain with push off test on lt Slight atrophy infraspinatus on lt shoulder  Rt knee- medial compartment break down, none on lt Extension -5 deg on rt, -2 deg on lt Flexion contracture when lying on rt Lt knee normal flexion Rt knee flexion 110 deg, has bakers cyst No effusion noted over the right knee    Assessment & Plan:

## 2010-05-21 NOTE — Assessment & Plan Note (Signed)
Still has signs of impingement syndrome on the left. Given a modified set of exercises. We will use very low weight was 1 pound. Try to maintain a normal range of motion. Recheck this in 2-3 months.

## 2010-05-26 ENCOUNTER — Ambulatory Visit: Payer: BC Managed Care – PPO | Admitting: Family Medicine

## 2010-06-08 NOTE — Op Note (Signed)
Michelle Sims, Michelle Sims              ACCOUNT NO.:  [id number]   MEDICAL RECORD NO.:  [id number]          PATIENT TYPE:  AMB   LOCATION:  [facility]                         FACILITY:  [facility]   PHYSICIAN:  Daniel L. Gottsegen, M.D.DATE OF BIRTH:  February 27, 1929   DATE OF PROCEDURE:  09/26/2007  DATE OF DISCHARGE:                               OPERATIVE REPORT   PREOPERATIVE DIAGNOSES:  Postmenopausal bleeding,  endometrial/endocervical polyp.   POSTOPERATIVE DIAGNOSES:  Postmenopausal bleeding,  endometrial/endocervical polyp, intrauterine adhesions.   OPERATION:  Hysteroscopy with excision of endocervical polyp.   SURGEON:  Daniel L. Eda Paschal, M.D.   ANESTHESIA:  General.   INDICATIONS:  The patient is a 77 year old female who had presented to  the office in the middle part of last month with 1 day of light vaginal  bleeding.  Ultrasound revealed an enlarged endometrial cavity as well as  what appeared to be either an endocervical or an endometrial polyp.  She  now enters the hospital for excision of the above.  She has a history of  severe cervical stenosis and she was treated with Cytotec the night  before the procedure to try to facilitate the procedure.   FINDINGS:  External is normal, BUS is normal.  Vaginal shows atrophic  changes.  Cervix is flush with the top of the vagina.  Uterus is  severely retroverted, normal size and shape.  There is severe cervical  stenosis.  Adnexa fails to reveal masses.  At the time of hysteroscopy,  it was felt that we entered the intrauterine cavity but there were  massive intrauterine adhesions present.  We did see an endocervical  polyp.  We did not see an endometrial polyp but you could not see the  tubal ostia or the top of the fundus clearly at all.   PROCEDURE:  After adequate general anesthesia, the patient was placed in  the dorsal lithotomy position, prepped and draped in the usual sterile  manner.  A single-tooth tenaculum was placed in the  anterior lip of the  cervix.  A Christella Hartigan was placed in the posterior lip of the cervix to try  to get symmetrical counter traction and then I was able to dilate her  cervix to a #23 Pratt dilator.  A hysteroscopic examination was done.  You could clearly see the endocervical canal well and there was a polyp  there.  We thought that we had entered the endometrial cavity without  perforating the uterus and we could see very little because of the  intrauterine adhesions.  We could never see the top of the fundus.  It  was felt that we were in to the depth of her fundus based on the size  and did not want to perforate her uterus.  At this point, using a  Randall polyp forceps and a sharp curette, tissue was obtained.  It  included the endocervical lesion seen which we thought was a polyp and  some very scant endometrial curettings.  She was re-  hysteroscoped.  The polyp was gone.  There was still no evidence of  perforation.  Fluid deficit was  less than 200 mL.  Blood loss was  minimal.  The procedure was terminated.  The specimen was sent to  pathology for tissue diagnosis.  The patient left the operating room in  satisfactory condition.      Daniel L. Eda Paschal, M.D.  Electronically Signed     DLG/MEDQ  D:  09/26/2007  T:  09/26/2007  Job:  [id number]

## 2010-06-11 NOTE — Op Note (Signed)
NAME:  Michelle Sims, Michelle Sims                        ACCOUNT NO.:  [id number]   MEDICAL RECORD NO.:  [id number]                   PATIENT TYPE:  AMB   LOCATION:  [facility]                                  FACILITY:  [facility]   PHYSICIAN:  Katy Fitch. Naaman Plummer., M.D.          DATE OF BIRTH:  [birth year]   DATE OF PROCEDURE:  11/13/2001  DATE OF DISCHARGE:                                 OPERATIVE REPORT   PREOPERATIVE DIAGNOSES:  1. Enlarging osteochondral mass, left index finger, metacarpal head at     metacarpophalangeal joint, intra-articular.  2. Rule out osteochondroma versus flesh osteoblastoma.   POSTOPERATIVE DIAGNOSIS:  Probable osteochondroma.   OPERATION:  Excision of enlarging osteochondral mass, left index finger,  metacarpal head.   SURGEON:  Katy Fitch. Sypher, M.D.   ASSISTANT:  Marveen Reeks. Dasnoit, P. A.   ANESTHESIA:  Axillary block.   ANESTHESIOLOGIST:  Janetta Hora. Gelene Mink, M.D.   INDICATIONS FOR SURGERY:  The patient is a 77 year old  woman who has had an  enlarging mass on the dorsal aspect of her left index finger  metacarpophalangeal joint for the past four years.  She was evaluated at the  office and had x-rays as a baseline and returned several years later for  repeat x-rays and evaluation.  She was noted to have loss of range of motion  in her metacarpophalangeal joint with development of flexion deformity of  approximately 15 degrees and inability to hyperextend the joint.  Serial x-  rays revealed significant enlargement of the mass therefore we recommended  excisional biopsy for diagnosis.  The appearance of the mass was that of  mature bone, which was consistent with a probable periarticular  osteochondroma; however, given the enlargement of the mass other diagnoses  need to be considered.  She is brought to the operating room at this time  for excisional biopsy.   Informed consent was completed with questions being invited and answered in  the preop holding  area.   DESCRIPTION OF OPERATION:  The patient was brought to the operating room and  placed in the supine position on the operating table.  Following axillary  block in the holding area by Dr. Gelene Mink anesthesia was satisfactory in  the left arm.  The left arm was prepped with Betadine soap and solution, and  sterilely draped.  Following exsanguination of the limb with an Esmarch  bandage sterile tourniquet was placed and inflated to 260 mmHg due to mild  systolic hypertension.   The procedure commenced with curvilinear incision exposing the extensor  mechanism.  The extensor hood was incised between the common extensor and  the extensor indicis proprius.  The MP capsule was distended by the mass.  Capsule was incised longitudinally and a periosteal elevator was used to  expose the mass complexity.  This appeared to be an osteochondroma type  lesion with high intra-articular cartilage overlying the mass on its dorsal  great and  longer surfaces.  A small osteotome was used at the base of the  mass to amputate it off the neck of the metacarpal.  This was removed as a  single piece.  The remaining portion of the mass at the neck of the  metacarpal was removed with rongeurs.  The mass was passed off in formalin  for pathologic evaluation.   The capsule was then repaired with mattress sutures of 3-0 Ethibond followed  by repair of the extensor split with figure-of-eight type sutures, knots  buried of 3-0 Ethibond.  The wound was repaired with intradermal 0-Prolene  and Steri-strips.  A compressive dressing was applied with a volar plaster  splint maintaining the MP joint at 30 degrees of flexion.                                                 Katy Fitch Naaman Plummer., M.D.    RVS/MEDQ  D:  11/13/2001  T:  11/13/2001  Job:  [id number]

## 2010-06-11 NOTE — Op Note (Signed)
Michelle Sims, Michelle Sims              ACCOUNT NO.:  [id number]   MEDICAL RECORD NO.:  [id number]          PATIENT TYPE:  AMB   LOCATION:  [facility]                         FACILITY:  [facility]   PHYSICIAN:  Daniel L. Gottsegen, M.D.DATE OF BIRTH:  23-Aug-1929   DATE OF PROCEDURE:  01/15/2004  DATE OF DISCHARGE:                                 OPERATIVE REPORT   PREOPERATIVE DIAGNOSES:  Endocervical polyp.   POSTOPERATIVE DIAGNOSES:  Endocervical polyp.   OPERATION:  Hysteroscopy with excision of endocervical polyp.   SURGEON:  Daniel L. Eda Paschal, M.D.   ANESTHESIA:  General.   INDICATIONS FOR PROCEDURE:  The patient is a 77 year old female who had  presented to the office in late November with abdominal bloating and pelvic  pain.  Ultrasound was done to rule out ovarian pathology, there was none  found but the patient had a well defined 1 cm growth in the endocervix. She  had severe cervical stenosis and therefore was not possible to sample it in  the office.  A phone consultation with De Blanch, M.D. was  obtained and he felt that because of the cervical stenosis this should be  removed to ensure that it was not malignant. She now enters the hospital for  the above.   FINDINGS:  External is normal, BUS is normal, vaginal is normal, cervix is  flush with the top of the vagina. There is severe cervical stenosis. The  uterus is small, atrophic, slightly retroverted, adnexa are not palpable and  there are no masses in the adnexa.   DESCRIPTION OF PROCEDURE:  After adequate general anesthesia, the patient  was placed in the dorsal lithotomy position, prepped and draped in the usual  sterile manner. A Jacobs tenaculum was placed on the posterior lip of the  cervix as you could not even grasp the anterior lip because of the atrophic  changes and that it was flush with the vagina.  It was, however, fairly easy  to dilate the cervix although it was not felt that the internal os  actually  had even been opened. The cervix was dilated to a #25 Pratt dilator and then  an ECC curette, a small sharp________ curette and the Randall polyp forceps  were utilized to try to remove any cervical pathology, fragments of small  pieces of tissue were obtained. It was felt therefore the patient should be  hysteroscoped to be sure something significant was not being missed. A  diagnostic hysteroscope was used using 3% sorbitol to expand the cervix and  the intrauterine cavity and the camera for magnification.  Initially  hysteroscopy was confined to the endocervical canal. The growth that was  described on ultrasound could be seen on the anterior wall.  It had already  been somewhat removed with the above procedure but there were still small  pieces of it.  It appeared to be exceedingly small and possibly was even  just some stenotic areas along with some reactive changes.  With very gentle  pressure, we could now enter the endometrial cavity under direct vision and  there was absolutely no pathology in the  endometrial cavity at all.  The  hysteroscope was removed and the rest of this area was scraped and sent to  pathology along with the first sample. She was then rehysteroscoped and  there was clearly no endocervical pathology at this  point. The procedure was terminated, fluid deficit was less than 50 mL,  blood loss was less than 10 mL. She was not actively bleeding but some  Monsel was placed in the cervix for additional hemostasis. The patient left  the operating room in satisfactory condition.     Dani   DLG/MEDQ  D:  01/15/2004  T:  01/15/2004  Job:  [id number]

## 2010-06-15 ENCOUNTER — Ambulatory Visit: Payer: BC Managed Care – PPO | Admitting: Sports Medicine

## 2010-06-24 ENCOUNTER — Ambulatory Visit (INDEPENDENT_AMBULATORY_CARE_PROVIDER_SITE_OTHER): Payer: BC Managed Care – PPO | Admitting: Sports Medicine

## 2010-06-24 ENCOUNTER — Encounter: Payer: Self-pay | Admitting: Sports Medicine

## 2010-06-24 VITALS — BP 137/87 | HR 78

## 2010-06-24 DIAGNOSIS — M545 Low back pain, unspecified: Secondary | ICD-10-CM

## 2010-06-24 DIAGNOSIS — M546 Pain in thoracic spine: Secondary | ICD-10-CM

## 2010-06-24 NOTE — Assessment & Plan Note (Signed)
She has chronic degenerative disease of her spine from cervical to lumbar areas. With this in mind I did encourage her to take a bit more ibuprofen and she is reluctant to take medicines She needs to be on more consistent with her activity level so the stiffness does not get worse  We modified her exercises to get in some of the oblique muscles of the low back and also the hip rotator muscles  She will follow with me as needed

## 2010-06-24 NOTE — Patient Instructions (Signed)
Go ahead and use ibuprofen up to 2 at a time up to 3 times during the day if needed Don't use more than 5 days straight  Keep up chair exercise with leaning forward - for low back stretch to both knees and then Rt shoulder shoulder to left knee and vice versa Do 3 to 5 times (seated)  Do knee extensions both bent and straight (seated)  Do hip flexion (seated)  Do hip rotation (standing/ leaning on table)  Keep up a few upper body exercises  Don't use weight in pool  Try to mix some walking and some biking as many days as possible

## 2010-06-24 NOTE — Assessment & Plan Note (Signed)
This is much improved. I encouraged her to keep up lightweight but not using any weights in the swimming pool area as I think that may be too much stress

## 2010-06-24 NOTE — Progress Notes (Signed)
  Subjective:    Patient ID: Michelle Sims, female    DOB: 05-Nov-1929, 77 y.o.   MRN: [id number]  HPI RT knee - went to [facility] and did well with walking Wearing good shoes Not as much a problem today Has done less of rehab but will restart  Low back pain Very stiff in mornings Stiffness is slow to resolve Can't bend well and gets stiff if she sits or lies down Last PM took two advil and did well this AM   Review of Systems    still feels fatigued and is having her thyroid monitored by [name] Objective:   Physical Exam    no acute distress  Low back shows flexion to about 80 and extension to 20 before she feels tightness or pain Lateral bending to the right and left of 20 is not painful Rotation causes tightness but not pain Straight leg raise bilaterally is negative FABER is tight bilaterally and on the left causes some mild deep buttocks pain  Neurologic exam is unremarkable with normal strength Gait reveals no limp    Assessment & Plan:

## 2010-07-02 ENCOUNTER — Other Ambulatory Visit: Payer: Self-pay | Admitting: Dermatology

## 2010-07-16 ENCOUNTER — Encounter: Payer: Self-pay | Admitting: Sports Medicine

## 2010-07-27 ENCOUNTER — Ambulatory Visit (INDEPENDENT_AMBULATORY_CARE_PROVIDER_SITE_OTHER): Payer: BC Managed Care – PPO | Admitting: Gynecology

## 2010-07-27 DIAGNOSIS — N952 Postmenopausal atrophic vaginitis: Secondary | ICD-10-CM

## 2010-07-27 DIAGNOSIS — B3731 Acute candidiasis of vulva and vagina: Secondary | ICD-10-CM

## 2010-07-27 DIAGNOSIS — B373 Candidiasis of vulva and vagina: Secondary | ICD-10-CM

## 2010-07-27 DIAGNOSIS — N898 Other specified noninflammatory disorders of vagina: Secondary | ICD-10-CM

## 2010-07-27 DIAGNOSIS — N816 Rectocele: Secondary | ICD-10-CM

## 2010-08-10 ENCOUNTER — Ambulatory Visit (INDEPENDENT_AMBULATORY_CARE_PROVIDER_SITE_OTHER): Payer: BC Managed Care – PPO | Admitting: Obstetrics and Gynecology

## 2010-08-10 DIAGNOSIS — B3731 Acute candidiasis of vulva and vagina: Secondary | ICD-10-CM

## 2010-08-10 DIAGNOSIS — B373 Candidiasis of vulva and vagina: Secondary | ICD-10-CM

## 2010-08-10 DIAGNOSIS — N952 Postmenopausal atrophic vaginitis: Secondary | ICD-10-CM

## 2010-08-10 DIAGNOSIS — N390 Urinary tract infection, site not specified: Secondary | ICD-10-CM

## 2010-08-24 ENCOUNTER — Other Ambulatory Visit: Payer: Self-pay | Admitting: Neurology

## 2010-08-24 DIAGNOSIS — R413 Other amnesia: Secondary | ICD-10-CM

## 2010-08-24 DIAGNOSIS — G250 Essential tremor: Secondary | ICD-10-CM

## 2010-08-24 DIAGNOSIS — G589 Mononeuropathy, unspecified: Secondary | ICD-10-CM

## 2010-08-24 DIAGNOSIS — R269 Unspecified abnormalities of gait and mobility: Secondary | ICD-10-CM

## 2010-08-24 DIAGNOSIS — G252 Other specified forms of tremor: Secondary | ICD-10-CM

## 2010-08-26 ENCOUNTER — Ambulatory Visit
Admission: RE | Admit: 2010-08-26 | Discharge: 2010-08-26 | Disposition: A | Discharge status: Home / Self Care | Payer: BC Managed Care – PPO | Source: Ambulatory Visit | Attending: Neurology | Admitting: Neurology

## 2010-08-26 DIAGNOSIS — G589 Mononeuropathy, unspecified: Secondary | ICD-10-CM

## 2010-08-26 DIAGNOSIS — R269 Unspecified abnormalities of gait and mobility: Secondary | ICD-10-CM

## 2010-08-26 DIAGNOSIS — R413 Other amnesia: Secondary | ICD-10-CM

## 2010-08-26 DIAGNOSIS — G252 Other specified forms of tremor: Secondary | ICD-10-CM

## 2010-08-26 DIAGNOSIS — G250 Essential tremor: Secondary | ICD-10-CM

## 2010-08-26 MED ORDER — GADOBENATE DIMEGLUMINE 529 MG/ML IV SOLN
12.0000 mL | Freq: Once | INTRAVENOUS | Status: AC | PRN
  Administered 2010-08-26: 12 mL via INTRAVENOUS

## 2010-11-25 ENCOUNTER — Ambulatory Visit (INDEPENDENT_AMBULATORY_CARE_PROVIDER_SITE_OTHER): Payer: BC Managed Care – PPO | Admitting: Licensed Clinical Social Worker

## 2010-11-25 DIAGNOSIS — F411 Generalized anxiety disorder: Secondary | ICD-10-CM

## 2010-11-30 ENCOUNTER — Encounter: Payer: Self-pay | Admitting: Sports Medicine

## 2010-11-30 ENCOUNTER — Ambulatory Visit (INDEPENDENT_AMBULATORY_CARE_PROVIDER_SITE_OTHER): Payer: BC Managed Care – PPO | Admitting: Sports Medicine

## 2010-11-30 DIAGNOSIS — M545 Low back pain, unspecified: Secondary | ICD-10-CM

## 2010-11-30 DIAGNOSIS — M5020 Other cervical disc displacement, unspecified cervical region: Secondary | ICD-10-CM

## 2010-11-30 NOTE — Assessment & Plan Note (Signed)
Has some chronic sxs again  Restart exercise primarily in chair  OK to cont water aerobics

## 2010-11-30 NOTE — Patient Instructions (Signed)
Lie down flat and stretch neck out and arms out to side- hold stretch for 10-15 seconds After this pull knees to chest - hold stretch for 10-15 seconds.  Repeat these stretches 3 times   Roll shoulders out holding 1 lb weights at shoulder height and waist level. Do 5-10 repetitions  Do rowing motion at waist level do 5-10 repetitions   In seated position stretch right knee to left shoulder- then switch and stretch left knee to right shoulder- repeat stretches 3 times  In seated position - bend forward, touch your knees, and slide down legs as far as comfortable - repeat stretch 3 times  Take tramadol 50 mg - 1/2 tablet 3 times per day.  If this does not help- increase to 1 tablet 3 times per day  Do easy range of motion with your neck   Please follow up in 6 weeks

## 2010-11-30 NOTE — Assessment & Plan Note (Signed)
This had been stable but has flared again  Restart exercises Restart tramadol tid  Reck in 6 wks

## 2010-11-30 NOTE — Progress Notes (Signed)
  Subjective:    Patient ID: Michelle Sims, female    DOB: 1929-09-19, 77 y.o.   MRN: [id number]  HPI  Pt presents to clinic for f/u of upper back pain. This has flared twice in the past 1.5 yr.   The first time gabapentin helped relieve the pain, the second  time gabapentin did not help, started on tramadol and pain eventually resolved. Upper back pain has now returned over the past 1.5 week. She has been taking tramadol 50 mg 1/2 tab 2-3 times per day.  X-ray shows severe cervical DDD. Now has pain posterior neck at C7.  She has been seeing Dr. Horald Pollen for regulation of her thyroid levels.  Fatigue has improved.   Seeing Dr Sandria Manly for handwriting issues and tremor - recheck soon   Review of Systems     Objective:   Physical Exam  Fine resting tremor in hands L > R  Walks with less hip and leg motion on the left than right  No cogwheeling either arm  Head forward position- 10 degrees forward flexion of neck No paravertebral spasm Neck extension 10-15 deg, full flexion, rotation to rt 20 deg, to lt 30 deg Neck lateral bend to lt 15 deg, to rt 10-15 deg Neg straight leg raise bilat SI joints freely movable  FABER very tight on lt and causes low back pain FABER on rt also very tight, more than on lt           Assessment & Plan:

## 2010-12-09 ENCOUNTER — Ambulatory Visit (INDEPENDENT_AMBULATORY_CARE_PROVIDER_SITE_OTHER): Payer: BC Managed Care – PPO | Admitting: Licensed Clinical Social Worker

## 2010-12-09 DIAGNOSIS — F411 Generalized anxiety disorder: Secondary | ICD-10-CM

## 2010-12-23 ENCOUNTER — Ambulatory Visit (INDEPENDENT_AMBULATORY_CARE_PROVIDER_SITE_OTHER): Payer: BC Managed Care – PPO | Admitting: Licensed Clinical Social Worker

## 2010-12-23 DIAGNOSIS — F411 Generalized anxiety disorder: Secondary | ICD-10-CM

## 2011-01-05 ENCOUNTER — Telehealth: Payer: Self-pay | Admitting: Internal Medicine

## 2011-01-05 ENCOUNTER — Ambulatory Visit (INDEPENDENT_AMBULATORY_CARE_PROVIDER_SITE_OTHER): Payer: BC Managed Care – PPO | Admitting: Licensed Clinical Social Worker

## 2011-01-05 DIAGNOSIS — F411 Generalized anxiety disorder: Secondary | ICD-10-CM

## 2011-01-05 NOTE — Telephone Encounter (Signed)
Patient has an appt with Dr. Darrick Penna next Tuesday but is having extreme back pain and wants to see if she can be seen today or tomorrow by him.

## 2011-01-06 ENCOUNTER — Ambulatory Visit: Payer: BC Managed Care – PPO | Admitting: Licensed Clinical Social Worker

## 2011-01-10 ENCOUNTER — Ambulatory Visit (INDEPENDENT_AMBULATORY_CARE_PROVIDER_SITE_OTHER): Payer: BC Managed Care – PPO | Admitting: Sports Medicine

## 2011-01-10 ENCOUNTER — Encounter: Payer: Self-pay | Admitting: Sports Medicine

## 2011-01-10 VITALS — BP 130/99 | HR 78

## 2011-01-10 DIAGNOSIS — M5020 Other cervical disc displacement, unspecified cervical region: Secondary | ICD-10-CM

## 2011-01-10 DIAGNOSIS — M546 Pain in thoracic spine: Secondary | ICD-10-CM

## 2011-01-10 NOTE — Patient Instructions (Addendum)
Rub zostrix cream into rash on your back twice daily.  We will contact Dr. West Carbo to discuss changing your anxiety medication to something that you can take along with Tramadol.   Dr. Darrick Penna will call you with the results of his conversation with Dr. West Carbo.  Please follow up in 1 month.  Thank you for seeing [place] today!

## 2011-01-10 NOTE — Assessment & Plan Note (Signed)
While this is typically spams of perscap MM, she has a new rash today  This is not vesicular but she has had zoster vaccine  Suspect this may be attenuated breakout of shinlges  Will try topical capsaician  Reck 1 mo after changes

## 2011-01-10 NOTE — Progress Notes (Signed)
  Subjective:    Patient ID: Michelle Sims, female    DOB: [birth year], 77 y.o.   MRN: [id number]  HPI  Pt presents to clinic for f/u of upper back pain which has worsened since last visit.  Pain is located in the same area where she had cervical radiculopathy.  She states her psychiatrist advised that she stop the tramadol which was controlling her pain well because she is on zoloft, and these are contraindicated.  The upper back pain returned the day after she stopped taking tramadol.  Pain now starts first thing in the morning, and continues throughout the day.  Pain is relieved if she puts pressure on the painful area.   Pain radiates to the peri scapular area more left than right Pain on left worse at this time  Review of Systems     Objective:   Physical Exam  NAD Maculopapular skin lesions from lt T3 distribution over lt scapula Neck with limited motion as before No scapular winging noted TTP along left peri-scapular border        Assessment & Plan:

## 2011-01-10 NOTE — Assessment & Plan Note (Signed)
This appears to be another flare of her DDD of cx spine after weaning tramadol  I think she should either monitor for side effects or switch off SSRI medications as she has not found anything else that controls pain as well as tramadol and narcotics are too strong fore her  Will discuss w Dr Donell Beers

## 2011-01-11 ENCOUNTER — Ambulatory Visit: Payer: BC Managed Care – PPO | Admitting: Sports Medicine

## 2011-01-12 ENCOUNTER — Other Ambulatory Visit: Payer: Self-pay | Admitting: *Deleted

## 2011-01-12 MED ORDER — CITALOPRAM HYDROBROMIDE 20 MG PO TABS: 20.0000 mg | ORAL_TABLET | Freq: Every day | ORAL | Status: DC

## 2011-01-12 NOTE — Progress Notes (Signed)
Dr. Darrick Penna spoke with pt 01/12/11- we were unable to speak with Dr. Donell Beers as he is out of the office until after Christmas. Pt is in significant pain now from cervical DJD.  Dr. Darrick Penna recommends that she restart tramadol 50 mg - 1/2 tab three times per day, and stop zoloft for now as these meds are contraindicated.  After 1 week if pain is not controlled it is ok to take tramadol 50 mg three times per day.  If she has changes in her mood after stopping zoloft- take celexa.  Pt was on celexa in combination with tramadol for at least 1 year without adverse reaction.  Pt to discuss meds with Dr. Donell Beers at appointment 01/20/11.   Dr. Darrick Penna recommends that pt discuss with Dr. Donell Beers the possibility of adding low dose amitriptyline to patient's med regimen to help block nerve irritation related to cervical DJD.

## 2011-01-26 ENCOUNTER — Other Ambulatory Visit: Payer: Self-pay | Admitting: Internal Medicine

## 2011-01-26 DIAGNOSIS — Z139 Encounter for screening, unspecified: Secondary | ICD-10-CM

## 2011-01-27 ENCOUNTER — Ambulatory Visit (INDEPENDENT_AMBULATORY_CARE_PROVIDER_SITE_OTHER): Payer: BC Managed Care – PPO | Admitting: Family Medicine

## 2011-01-27 ENCOUNTER — Encounter: Payer: Self-pay | Admitting: Family Medicine

## 2011-01-27 VITALS — BP 133/85 | HR 71

## 2011-01-27 DIAGNOSIS — M25519 Pain in unspecified shoulder: Secondary | ICD-10-CM

## 2011-01-27 DIAGNOSIS — M898X1 Other specified disorders of bone, shoulder: Secondary | ICD-10-CM

## 2011-01-27 DIAGNOSIS — M5020 Other cervical disc displacement, unspecified cervical region: Secondary | ICD-10-CM

## 2011-01-27 MED ORDER — METHYLPREDNISOLONE 4 MG PO KIT: PACK | ORAL | Status: DC

## 2011-01-27 MED ORDER — AMITRIPTYLINE HCL 25 MG PO TABS: 25.0000 mg | ORAL_TABLET | Freq: Every evening | ORAL | Status: DC | PRN

## 2011-01-27 NOTE — Progress Notes (Signed)
Subjective:    Patient ID: Michelle Sims, female    DOB: 11/08/1929, 78 y.o.   MRN: [id number]  HPI  Michelle Sims is a pleasant 78 yo female patient  Complaining of left shoulder blade pain, is likely a C-spine radiculopathy. He has a recurring condition. Date that the pain is located in the medial aspect of her left shoulder blade, one on, sharp, 410 intensity, tramadol used to help her with a pain but is not helping her anymore. He denies any numbness or tingling distally to her arm. She denies any weakness on her upper extremities.  She had C-spine x-rays on January 2012 which revealed PTT from C3-C7 with bone spurs in the C3-C4 foramina.   Patient Active Problem List  Diagnoses  . DEPRESSION  . HYPERTENSION  . CELLULITIS  . DEGENERATIVE JOINT DISEASE, RIGHT KNEE  . SHOULDER PAIN, LEFT  . HIP PAIN, LEFT  . DEGENERATIVE DISC DISEASE, CERVICAL SPINE, W/RADICULOPATHY  . BACK PAIN, THORACIC REGION, CHRONIC  . LUMBAGO  . METATARSALGIA  . FATIGUE  . BREAST CANCER, HX OF  . THYROID CANCER, HX OF  . MASTECTOMY, RIGHT, HX OF  . THYROIDECTOMY, HX OF  . NECK PAIN     Current Outpatient Prescriptions on File Prior to Visit  Medication Sig Dispense Refill  . amLODipine (NORVASC) 5 MG tablet Take 5 mg by mouth daily.        . clonazePAM (KLONOPIN) 0.5 MG tablet Take 1/4 tab qhs      . EVISTA 60 MG tablet Take 60 mg by mouth daily.      . fish oil-omega-3 fatty acids 1000 MG capsule Take 2 g by mouth daily.        . irbesartan (AVAPRO) 75 MG tablet Take 75 mg by mouth daily.      Marland Kitchen LEVOTHYROXINE SODIUM PO Take by mouth. Take 175 mcg po 4 days per week, and 150 mg po 3 days per week.       . traMADol (ULTRAM) 50 MG tablet Take 50 mg by mouth daily.         Review of Systems  Constitutional: Negative for fever, chills, diaphoresis and fatigue.  Musculoskeletal: Negative for back pain, joint swelling, arthralgias and gait problem.       Left shoulder blade pain  Neurological:  Negative for weakness and numbness.       Objective:   Physical Exam  Constitutional: She is oriented to person, place, and time. She appears well-developed and well-nourished.       BP 133/85  Pulse 71   Neck:       Neck  with intact skin. No swelling, no hematomas. Decrease ROM  for  extension, rotation and lateralization .  Strength 5/5 for shoulder flexion and extension, 5/5 for elbow flexion and extension, 5/5 for wrist flexion and extension B/L. DTR biceps,triceps and brachioradialis II/IV B/L. Sensation intact distally B/L. There eis TTP in the middle border of her left shoulder blade.    Pulmonary/Chest: Effort normal.  Neurological: She is alert and oriented to person, place, and time.  Skin: Skin is warm. No rash noted. No erythema.  Psychiatric: She has a normal mood and affect. Her behavior is normal.          Assessment & Plan:   1. DEGENERATIVE DISC DISEASE, CERVICAL SPINE, W/RADICULOPATHY  Ambulatory referral to Physical Therapy, amitriptyline (ELAVIL) 25 MG tablet, DISCONTINUED: methylPREDNISolone (MEDROL, PAK,) 4 MG tablet  2. Periscapular pain  Ambulatory referral to  Physical Therapy, amitriptyline (ELAVIL) 25 MG tablet, DISCONTINUED: methylPREDNISolone (MEDROL, PAK,) 4 MG tablet    Start neurontin 100 mg HS Start elavil 25 mg HS Start C spine rehabilitation Start C spine HEP Use moist heat. F/U in 2 weeks.

## 2011-01-27 NOTE — Patient Instructions (Signed)
Start neurontin 100 mg HS Start elavil 25 mg HS Start C spine rehabilitation Start C spine HEP F/U in 2 weeks.

## 2011-02-04 ENCOUNTER — Other Ambulatory Visit: Payer: Self-pay | Admitting: Internal Medicine

## 2011-02-04 DIAGNOSIS — M8580 Other specified disorders of bone density and structure, unspecified site: Secondary | ICD-10-CM

## 2011-02-07 ENCOUNTER — Encounter: Payer: Self-pay | Admitting: Internal Medicine

## 2011-02-09 ENCOUNTER — Encounter: Payer: Self-pay | Admitting: Sports Medicine

## 2011-02-09 ENCOUNTER — Ambulatory Visit (INDEPENDENT_AMBULATORY_CARE_PROVIDER_SITE_OTHER): Payer: BC Managed Care – PPO | Admitting: Sports Medicine

## 2011-02-09 VITALS — BP 123/79 | HR 81 | Ht 63.0 in | Wt 130.0 lb

## 2011-02-09 DIAGNOSIS — M5020 Other cervical disc displacement, unspecified cervical region: Secondary | ICD-10-CM

## 2011-02-09 DIAGNOSIS — M546 Pain in thoracic spine: Secondary | ICD-10-CM

## 2011-02-09 MED ORDER — HYDROCODONE-ACETAMINOPHEN 5-500 MG PO TABS: 0.5000 | ORAL_TABLET | Freq: Two times a day (BID) | ORAL | Status: AC | PRN

## 2011-02-09 NOTE — Patient Instructions (Signed)
Dr. Darrick Penna wants you to try the following medications for your back pain:  Take 1/2 a vicodin tablet in the morning, when you first feel the pain start.   Take one aleve in the afternoon with food.  If your pain is severe, you can take another 1/2 of a vicodin later in the day.

## 2011-02-09 NOTE — Assessment & Plan Note (Addendum)
Pt with documented DDD, likely cause of back pain.    Primary treatment will be medical Trial of low dose vicodin once daily Will hold tramadol since on zoloft In PM take 1 aleve  If this combination did not work we may want to try to get her back to combo of tramadol and gabapentin but to do so may need to change antidepressant choice  Reck 1 month

## 2011-02-09 NOTE — Assessment & Plan Note (Addendum)
Likely due to DDD.  Pain worsened, pt unable to tolerate amitriptyline.  Have been unable to contact her Psychiatrist to discuss Zoloft and tramadol, however she was on both medications for a period of time in the past without singificat interactions. Today, will try 1/2 vicodin once or twice daily for pain.  Will also advise on aleve in the afternoon.  Will try to contact Dr. Donell Beers to discuss tramadol.

## 2011-02-09 NOTE — Progress Notes (Signed)
  Subjective:    Patient ID: Michelle Sims, female    DOB: 1929/09/03, 78 y.o.   MRN: [id number]  HPI  Michelle Sims comes back in for her upper back pain.  She says it is getting worse, and is now nearly constant pain.  She says it hurts with any movement and limits her ability to do home exercises.  She says she could not take amitriptyline because it made her feel drowsy for several days after taking it.  She cannot say any particular movement that causes the worst pain.   She also complains of some pain in her right hip.  She says it starts in her low back and shoots through her hip to her buttock. She had no injury.    Review of Systems Pertinent Items noted in HPI.     Objective:   Physical Exam BP 123/79  Pulse 81  Ht 5\' 3"  (1.6 m)  Wt 130 lb (58.968 kg)  BMI 23.03 kg/m2 General appearance: alert, cooperative and no distress Neck: decreased ROM with lateralization and rotation Back: mild asymmetry with right scapula lower than left but scapula without winging bilaterally. Tender to palpation medial to left scapula.  No tenderness to palpation over the spine. Shoulders: Pt has full ROM and normal strength         Assessment & Plan:

## 2011-02-22 ENCOUNTER — Ambulatory Visit (INDEPENDENT_AMBULATORY_CARE_PROVIDER_SITE_OTHER): Payer: BC Managed Care – PPO | Admitting: Sports Medicine

## 2011-02-22 VITALS — BP 118/74

## 2011-02-22 DIAGNOSIS — L0390 Cellulitis, unspecified: Secondary | ICD-10-CM

## 2011-02-22 DIAGNOSIS — M1710 Unilateral primary osteoarthritis, unspecified knee: Secondary | ICD-10-CM

## 2011-02-22 DIAGNOSIS — IMO0002 Reserved for concepts with insufficient information to code with codable children: Secondary | ICD-10-CM

## 2011-02-22 DIAGNOSIS — M5020 Other cervical disc displacement, unspecified cervical region: Secondary | ICD-10-CM

## 2011-02-22 DIAGNOSIS — M545 Low back pain, unspecified: Secondary | ICD-10-CM

## 2011-02-22 DIAGNOSIS — L0291 Cutaneous abscess, unspecified: Secondary | ICD-10-CM

## 2011-02-22 NOTE — Assessment & Plan Note (Signed)
Radicular pain persists then we will cautiously increase her gabapentin and tramadol again. Without better control of her pain she simply has no quality of life

## 2011-02-22 NOTE — Assessment & Plan Note (Signed)
Plan corticosteroid injection in 2 weeks if her infection has resolved and she is still having a lot of pain.

## 2011-02-22 NOTE — Assessment & Plan Note (Signed)
Pain is radiating into the buttocks. Continue to work with physical therapy and see if we can get some improvement. Okay to continue water aerobics.  Recheck her in 2-4 weeks pending response

## 2011-02-22 NOTE — Patient Instructions (Addendum)
Ok to start with 1 tramadol mid day for 3 days, the increase to 1 tramadol in the morning and 1 tramadol midday with the gabapentin for 1 week. The next week take 1 tramadol 3 times per day along with your gabapentin.  The next week take the tramadol three times per day, and gabapentin 100 mg twice daily and 200 mg at bedtime.  The next week take tramadol three times per day, gabapentin 100 mg twice daily and 300 mg at bedtime.

## 2011-02-22 NOTE — Progress Notes (Signed)
  Subjective:    Patient ID: Michelle Sims, female    DOB: [birth year], 78 y.o.   MRN: [id number]  HPI  Ms. Michelle Sims returns for significant upper back and right buttocks pain.  Working with Sharen Hones - he thinks he can help several trigger points. Yesterday had rt buttock pain that was helped at PT. Continues water aerobics, which she enjoys. Taking gabapentin 100 mg TID x 3 days.  Has tried taking wellbutrin, but caused insomnia.  We have discussed that if she takes tramadol there is an interaction with SSRI medications. However she cannot tolerate narcotic medications and did not get relief with NSAIDs. She has tolerated tramadol in combination with Celexa in the past without problems. I advised her that I thought the risk of the adverse reaction was low and that her pain was significant enough that she should try to cautiously restart the tramadol.  In addition she has severe arthritis of her right knee and was to return for another injection in about 2 weeks to    Review of Systems     Objective:   Physical Exam Patient looks like she feels poorly  Moderate warmth and effusion rt knee with chronic degenerative changes.   Free scapular movement with no spasms, slight scoliotic curve to the rt  no spasms today. Pain on too much movement of the neck or left arm.  Right buttocks is painful to palpation but to movement.  Warmth and swelling of rt forearm with lymphedema tracking to axilla .     Assessment & Plan:

## 2011-02-22 NOTE — Assessment & Plan Note (Signed)
This is a recurrent problem and she will continue on the treatment prescribed Dr. Orvan Falconer in which she takes Levaquin as directed.

## 2011-03-09 ENCOUNTER — Other Ambulatory Visit: Payer: BC Managed Care – PPO

## 2011-03-09 ENCOUNTER — Ambulatory Visit (INDEPENDENT_AMBULATORY_CARE_PROVIDER_SITE_OTHER): Payer: BC Managed Care – PPO | Admitting: Sports Medicine

## 2011-03-09 VITALS — BP 134/80

## 2011-03-09 DIAGNOSIS — M791 Myalgia, unspecified site: Secondary | ICD-10-CM

## 2011-03-09 DIAGNOSIS — IMO0002 Reserved for concepts with insufficient information to code with codable children: Secondary | ICD-10-CM

## 2011-03-09 DIAGNOSIS — M1710 Unilateral primary osteoarthritis, unspecified knee: Secondary | ICD-10-CM

## 2011-03-09 DIAGNOSIS — Z853 Personal history of malignant neoplasm of breast: Secondary | ICD-10-CM

## 2011-03-09 DIAGNOSIS — IMO0001 Reserved for inherently not codable concepts without codable children: Secondary | ICD-10-CM

## 2011-03-09 DIAGNOSIS — M25519 Pain in unspecified shoulder: Secondary | ICD-10-CM

## 2011-03-09 DIAGNOSIS — M25559 Pain in unspecified hip: Secondary | ICD-10-CM

## 2011-03-09 NOTE — Assessment & Plan Note (Signed)
Pain is about equal between left and right hips now although her motion is not really limited

## 2011-03-09 NOTE — Progress Notes (Signed)
  Subjective:    Patient ID: Michelle Sims, female    DOB: August 01, 1929, 78 y.o.   MRN: [id number]  HPI  Pt presents to clinic for rt knee pain She has known advanced DJD of this knee She is traveling to [place] area and doesn't feel she can stand and walk very well and that she has an injection Injection in the past helped her for about one month when she had to take a trip  She is also very concerned b/c she has been aching in her neck, shoulders, hips, buttocks, and legs.  States the aching is getting progressively worse.  She is following the titration schedule for gabapentin, she is taking tramadol 3 times per day.  Her pain continues to be in the upper back but is also worsening around her shoulders and down the outside of her hips and sometimes in her lower back No history of polymyalgia or of rheumatoid disease Her primary care physician did check a sedimentation rate back last fall that was normal  Note remote history of breast cancer and thyroid cancer   Review of Systems     Objective:   Physical Exam No acute distress but patient is uncomfortable sitting without support for upper back  Chronic degenerative changes on rt knee slight warmth over rt knee, no visible swelling 100 deg flexion on rt, lacks 5 deg of extension  Valgus shift on rt Full flexion and extension on lt knee  Neck forward position Pain with full elevation of both shoulders but the motion is not limited Pain with abduction or rotation of hips       Assessment & Plan:

## 2011-03-09 NOTE — Patient Instructions (Signed)
Ok to start with 1 tramadol mid day for 3 days, the increase to 1 tramadol in the morning and 1 tramadol midday with the gabapentin for 1 week.  The next week take 1 tramadol 3 times per day along with your gabapentin. The next week take the tramadol three times per day, and gabapentin 100 mg twice daily and 200 mg at bedtime. The next week take tramadol three times per day, gabapentin 100 mg twice daily and 300 mg at bedtime.  (this is the same schedule we gave you at your last visit)  Ice right knee a few times throughout the day for the next 2 days  Please have lab work drawn at the [facility]   Thank you for seeing [place] today!

## 2011-03-09 NOTE — Progress Notes (Signed)
CBC AND ESR DONE TODAY Michelle Sims

## 2011-03-09 NOTE — Assessment & Plan Note (Signed)
Symptoms now seem more bilateral

## 2011-03-09 NOTE — Assessment & Plan Note (Signed)
Consent obtained and verified. Sterile prep. Furthur cleansed with alcohol. Topical analgesic spray: Ethyl chloride. Joint: RT knee Approached in typical fashion with: Superior lateral border of the patella Completed without difficulty Meds: 4 cc lidocaine and 1 cc Solu-Medrol 40 Needle: 25-gauge and 1/2 inch Aftercare instructions and Red flags advised.   to use ice for the next few days We will reevaluate on her return visit

## 2011-03-09 NOTE — Assessment & Plan Note (Signed)
With her history of breast cancer and increasing musculoskeletal pain I want to check a CBC and a sedimentation rate  Polymyalgia would be a possibility  This may all be residual from her spinal arthropathy  Continue medication schedule as before and then we will reevaluate pending the results

## 2011-03-10 DIAGNOSIS — Z6791 Unspecified blood type, Rh negative: Secondary | ICD-10-CM | POA: Insufficient documentation

## 2011-03-10 DIAGNOSIS — N841 Polyp of cervix uteri: Secondary | ICD-10-CM | POA: Insufficient documentation

## 2011-03-10 DIAGNOSIS — C73 Malignant neoplasm of thyroid gland: Secondary | ICD-10-CM | POA: Insufficient documentation

## 2011-03-10 LAB — CBC
MCH: 31 pg (ref 26.0–34.0)
MCV: 96.3 fL (ref 78.0–100.0)
Platelets: 260 10*3/uL (ref 150–400)
RBC: 4.1 MIL/uL (ref 3.87–5.11)

## 2011-03-16 ENCOUNTER — Encounter: Payer: Self-pay | Admitting: Obstetrics and Gynecology

## 2011-03-16 ENCOUNTER — Other Ambulatory Visit (HOSPITAL_COMMUNITY)
Admission: RE | Admit: 2011-03-16 | Discharge: 2011-03-16 | Disposition: A | Discharge status: Home / Self Care | Payer: BC Managed Care – PPO | Source: Ambulatory Visit | Attending: Obstetrics and Gynecology | Admitting: Obstetrics and Gynecology

## 2011-03-16 ENCOUNTER — Ambulatory Visit (INDEPENDENT_AMBULATORY_CARE_PROVIDER_SITE_OTHER): Payer: BC Managed Care – PPO | Admitting: Obstetrics and Gynecology

## 2011-03-16 VITALS — BP 124/78 | Ht 63.0 in | Wt 130.0 lb

## 2011-03-16 DIAGNOSIS — M8580 Other specified disorders of bone density and structure, unspecified site: Secondary | ICD-10-CM

## 2011-03-16 DIAGNOSIS — N952 Postmenopausal atrophic vaginitis: Secondary | ICD-10-CM

## 2011-03-16 DIAGNOSIS — M899 Disorder of bone, unspecified: Secondary | ICD-10-CM

## 2011-03-16 DIAGNOSIS — C50919 Malignant neoplasm of unspecified site of unspecified female breast: Secondary | ICD-10-CM

## 2011-03-16 DIAGNOSIS — Z01419 Encounter for gynecological examination (general) (routine) without abnormal findings: Secondary | ICD-10-CM

## 2011-03-16 DIAGNOSIS — R102 Pelvic and perineal pain: Secondary | ICD-10-CM

## 2011-03-16 DIAGNOSIS — N949 Unspecified condition associated with female genital organs and menstrual cycle: Secondary | ICD-10-CM

## 2011-03-16 DIAGNOSIS — Z124 Encounter for screening for malignant neoplasm of cervix: Secondary | ICD-10-CM | POA: Insufficient documentation

## 2011-03-16 MED ORDER — RALOXIFENE HCL 60 MG PO TABS: 60.0000 mg | ORAL_TABLET | Freq: Every day | ORAL | Status: DC

## 2011-03-16 NOTE — Patient Instructions (Signed)
Schedule pelvic ultrasound

## 2011-03-16 NOTE — Progress Notes (Signed)
Patient came to see me today for her annual GYN exam. She says she feels her stomach is growing. Her PCP ordered a CA 125. It was normal. She remains on Evista for low grade osteopenia and protection against recurrent breast cancer. She continues to normal mammograms since her treatment for breast cancer. She is having no vaginal bleeding. She is having no pelvic pain. She is using estradiol vaginal cream 0.02% for vaginal dryness.  Physical examination: Selena Batten gardner present. HEENT within normal limits. Neck: Thyroid not large. No masses. Supraclavicular nodes: not enlarged. Breasts: Examined in both sitting midline position. She is status post right mastectomy and left partial mastectomy with bilateral reconstruction. There are no masses. Abdomen: Soft no guarding rebound or masses or hernia. Pelvic: External: Within normal limits. BUS: Within normal limits. Vaginal:within normal limits. Good estrogen effect. No evidence of cystocele rectocele or enterocele. Cervix: flush with vagina but clean. Uterus: Normal size and shape. Adnexa: No masses. Rectovaginal exam: Confirmatory and negative. Extremities: Within normal limits.  Assessment: #1. Breast cancer no existing disease #2. Atrophic vaginitis #3. Osteopenia #4. Thyroid cancer #5. Pelvic pain  Plan: Pelvic ultrasound scheduled. Continue Evista. Continue estradiol vaginal cream.

## 2011-03-17 ENCOUNTER — Ambulatory Visit: Payer: BC Managed Care – PPO

## 2011-03-17 ENCOUNTER — Other Ambulatory Visit: Payer: BC Managed Care – PPO

## 2011-03-17 LAB — URINALYSIS W MICROSCOPIC + REFLEX CULTURE
Bacteria, UA: NONE SEEN
Bilirubin Urine: NEGATIVE
Casts: NONE SEEN
Crystals: NONE SEEN
Ketones, ur: NEGATIVE mg/dL
Specific Gravity, Urine: 1.015 (ref 1.005–1.030)
Urobilinogen, UA: 0.2 mg/dL (ref 0.0–1.0)

## 2011-03-18 ENCOUNTER — Ambulatory Visit (INDEPENDENT_AMBULATORY_CARE_PROVIDER_SITE_OTHER): Payer: BC Managed Care – PPO

## 2011-03-18 ENCOUNTER — Ambulatory Visit (INDEPENDENT_AMBULATORY_CARE_PROVIDER_SITE_OTHER): Payer: BC Managed Care – PPO | Admitting: Obstetrics and Gynecology

## 2011-03-18 DIAGNOSIS — R141 Gas pain: Secondary | ICD-10-CM

## 2011-03-18 DIAGNOSIS — R140 Abdominal distension (gaseous): Secondary | ICD-10-CM

## 2011-03-18 DIAGNOSIS — R143 Flatulence: Secondary | ICD-10-CM

## 2011-03-18 DIAGNOSIS — D259 Leiomyoma of uterus, unspecified: Secondary | ICD-10-CM

## 2011-03-18 DIAGNOSIS — R102 Pelvic and perineal pain: Secondary | ICD-10-CM

## 2011-03-18 NOTE — Progress Notes (Signed)
Patient came to see me today for an ultrasound because of abdominal bloating.  On ultrasound her uterus is normal except for small fibroid of 1.8 cm which is smaller than the last time she had an ultrasound. Her endometrial echo is 4.3 mm. She is having no bleeding. No adnexal masses can be seen. Her cul-de-sac is free of fluid.  Assessment: #1 abdominal bloating #2 fibroid  Plan: Patient reassured.

## 2011-03-23 ENCOUNTER — Ambulatory Visit (INDEPENDENT_AMBULATORY_CARE_PROVIDER_SITE_OTHER): Payer: BC Managed Care – PPO | Admitting: Sports Medicine

## 2011-03-23 VITALS — BP 132/78

## 2011-03-23 DIAGNOSIS — M25559 Pain in unspecified hip: Secondary | ICD-10-CM

## 2011-03-23 DIAGNOSIS — M1710 Unilateral primary osteoarthritis, unspecified knee: Secondary | ICD-10-CM

## 2011-03-23 DIAGNOSIS — M25551 Pain in right hip: Secondary | ICD-10-CM

## 2011-03-23 DIAGNOSIS — IMO0002 Reserved for concepts with insufficient information to code with codable children: Secondary | ICD-10-CM

## 2011-03-23 DIAGNOSIS — M5020 Other cervical disc displacement, unspecified cervical region: Secondary | ICD-10-CM

## 2011-03-23 MED ORDER — TRAMADOL HCL 50 MG PO TABS: 50.0000 mg | ORAL_TABLET | Freq: Four times a day (QID) | ORAL | Status: DC | PRN

## 2011-03-23 NOTE — Assessment & Plan Note (Signed)
While her effusion has improved her pain remains  Suspect this indicates the severity of her arthritis  We will continue home medications and follow

## 2011-03-23 NOTE — Assessment & Plan Note (Signed)
This seems primarily related to a lumbar radiculopathy  She does have direct pain over her piriformis  We will start gentle exercises and gradually increase her gabapentin  Recheck in one month

## 2011-03-23 NOTE — Patient Instructions (Signed)
Sit on tennis ball and massage over painful area of right buttocks 3 times per day  Work hip motion in and out, up and down  On Friday- increase gabapentin to 200 mg in the morning, 100 mg mid day, and 300 mg in the evening for 1 week Next week on Friday increase to 200 mg in the morning and mid day, and 300 mg in the evening stay on this dose until follow up appointment  Take 1 tramadol with each dose of gabapentin  Try applying heating pad to right buttock and low back in the mornings  Please follow up in 1 month  Thank you for seeing [place] today!

## 2011-03-23 NOTE — Progress Notes (Signed)
  Subjective:    Patient ID: Michelle Sims, female    DOB: 1929-08-28, 78 y.o.   MRN: [id number]  HPI  Pt presents to clinic for f/u of upper back pain and rt posterior hip pain that radiates down back of leg to mid calf.  Upper back pain is improved, has not had pain since trip to [place] 1 week ago. Still having significant post rt hip pain and it radiates down back of leg with standing at sink. Continues water aerobics 3 days weekly, and walks  Has a hard time getting off of toilet and standing from chairs. Had rt knee injection at last visit, which says did not help pain. Taking tramadol x3 per day and gabapentin 100 mg in the morning, and midday, and 300 mg qhs.    Review of Systems     Objective:   Physical Exam No acute distress  Upper back shows normal movement and no tenderness Head forward position about 5  Rt knee- Less effusion and no warmth Chronic degenerative changes Lacks 8 degrees full extension Mild baker's cyst  Straight leg raise neg bilat FABER extremely tight on rt, not as tight on lt TTP over sciatic notch deep within buttocks        Assessment & Plan:

## 2011-03-23 NOTE — Assessment & Plan Note (Signed)
This has improved with a combination of tramadol and gabapentin  Continue to watch her neck position and posture

## 2011-03-29 ENCOUNTER — Ambulatory Visit
Admission: RE | Admit: 2011-03-29 | Discharge: 2011-03-29 | Disposition: A | Discharge status: Home / Self Care | Payer: BC Managed Care – PPO | Source: Ambulatory Visit | Attending: Internal Medicine | Admitting: Internal Medicine

## 2011-03-29 DIAGNOSIS — M8580 Other specified disorders of bone density and structure, unspecified site: Secondary | ICD-10-CM

## 2011-03-29 DIAGNOSIS — Z139 Encounter for screening, unspecified: Secondary | ICD-10-CM

## 2011-04-20 ENCOUNTER — Ambulatory Visit (INDEPENDENT_AMBULATORY_CARE_PROVIDER_SITE_OTHER): Payer: BC Managed Care – PPO | Admitting: Sports Medicine

## 2011-04-20 VITALS — BP 143/85

## 2011-04-20 DIAGNOSIS — M25551 Pain in right hip: Secondary | ICD-10-CM

## 2011-04-20 DIAGNOSIS — M25559 Pain in unspecified hip: Secondary | ICD-10-CM

## 2011-04-20 DIAGNOSIS — M5020 Other cervical disc displacement, unspecified cervical region: Secondary | ICD-10-CM

## 2011-04-20 DIAGNOSIS — Z9181 History of falling: Secondary | ICD-10-CM

## 2011-04-20 NOTE — Progress Notes (Signed)
  Subjective:    Patient ID: Michelle Sims, female    DOB: 1929/04/05, 78 y.o.   MRN: [id number]  HPI Pain particularly worse in the mornings in rt buttocks and down rt leg when she gets out of bed. Hard for her to walk to bathroom in the mornings 2/2 this pain. Low back pain is usually relieved soon after she sits down.  She has been able to do more cooking and cleaning up around the house, but notices a lot of joint stiffness.  Upper back pain has not returned, still doing well with this.  She continues going to water aerobics 2-3 times per week.   In her upper back the pain has been controlled well with gabapentin 200/200/300 as well as taking one tramadol 3 times a day     Review of Systems     Objective:   Physical Exam No acute distress  5-10 deg deviation to rt with standing- scoliotic change Shifted to rt with rt shoulder down Able to correct this if she concentrates on it  With her walking gait she persistently shifts to her right side so that her trunk is angulated approximately 10   Rt knee - no effusion, arthritic change/ standing genu valgus  Lt knee - has nickel sized abrasion with scab from previous fall  Rt leg- 2 cm longer in lying position, does not correct with sitting up  Functionally the right leg acts shorter because of both her scoliosis and her genu valgus when she is walking    Assessment & Plan:

## 2011-04-20 NOTE — Assessment & Plan Note (Signed)
This pain radiates from her lumbar spine.  I think we can improve this by working with her gait having her try to correction of the scoliosis that makes her lean to the right side  She is given some trunk stretching exercises

## 2011-04-20 NOTE — Patient Instructions (Addendum)
Talk to Dr. Sandria Manly about Neurological Physical therapy to help with gait and balance  Continue current medication regimen  Concentrate on not leaning to the right when you walk  Lean to the left side 3 times for a count of 5 seconds every day  Continue stationary bike and water aerobics this is very good for your joints  Increase water intake to help with constipation caused by tramadol  Please follow up after starting neurological physical therapy  Thank you for seeing [place] today!

## 2011-04-20 NOTE — Assessment & Plan Note (Signed)
This is better controlled with gabapentin and tramadol and we will leave the dose the same

## 2011-04-20 NOTE — Assessment & Plan Note (Signed)
She fell 2 weeks ago at [facility] and could not get up without help  She has trouble walking in the mornings until her back feels more flexible  She has trouble getting out of chairs with her 2 soft or deep  She is afraid to go down steps without a railing  I suggested she might benefit from a cane or walking stick She might also benefit from physical therapy to work on gait training and fall prevention She is to see Dr. love in 2 weeks about her balance and if he agrees we should go ahead and set that up

## 2011-04-26 ENCOUNTER — Telehealth: Payer: Self-pay | Admitting: *Deleted

## 2011-04-26 NOTE — Telephone Encounter (Signed)
Spoke with pt- she states her low back pain and radicular symptoms are improving, but she is very tired and has difficulty staying awake during the day.  Requesting to decrease some of her medications.  Per Dr. Darrick Penna- advised pt to decrease gabapentin to 200 mg q am, 100 mg mid day, and 300 mg at bedtime for one week- if pain does not worsen then decrease morning dose to 100 mg also, with the goal being to wean off of daytime gabapentin.  Advised pt to try this and call back if she has any problems.  Pt agreeable.

## 2011-05-12 ENCOUNTER — Telehealth: Payer: Self-pay | Admitting: *Deleted

## 2011-05-12 NOTE — Telephone Encounter (Signed)
Spoke with pt- she states she has been doing well titrating down on her gabapentin, but she is still feeling tired and thinks it is related to her meds.  She takes gabapentin 100 mg qam, 100 mg qpm, and 300 mg qhs.  States her pain level is still controlled on this dosage, but interested in reducing gabapentin more.  Advised her to try 100 mg q am, 100 mg qpm, and 200 mg qhs until follow up appt.  She states she is scheduled to see rehab for gait and balance training tomorrow.

## 2011-05-13 ENCOUNTER — Ambulatory Visit: Payer: BC Managed Care – PPO | Attending: Neurology | Admitting: Physical Therapy

## 2011-05-13 DIAGNOSIS — IMO0001 Reserved for inherently not codable concepts without codable children: Secondary | ICD-10-CM | POA: Insufficient documentation

## 2011-05-13 DIAGNOSIS — R262 Difficulty in walking, not elsewhere classified: Secondary | ICD-10-CM | POA: Insufficient documentation

## 2011-05-17 ENCOUNTER — Ambulatory Visit: Payer: BC Managed Care – PPO | Admitting: Physical Therapy

## 2011-05-18 ENCOUNTER — Ambulatory Visit (INDEPENDENT_AMBULATORY_CARE_PROVIDER_SITE_OTHER): Payer: BC Managed Care – PPO | Admitting: Sports Medicine

## 2011-05-18 VITALS — BP 146/83

## 2011-05-18 DIAGNOSIS — M25519 Pain in unspecified shoulder: Secondary | ICD-10-CM

## 2011-05-18 MED ORDER — MELOXICAM 7.5 MG PO TABS: ORAL_TABLET | ORAL | Status: DC

## 2011-05-18 NOTE — Patient Instructions (Signed)
Please take meloxicam 7.5 mg 1-2 tablets each day- take with food until follow up appointment  Schedule appointment on one of the fellows schedule in the next 7-12 days.  Do pendulum swing exercise daily  Continue on current medication regimen  Thank you for seeing [place] today!

## 2011-05-18 NOTE — Assessment & Plan Note (Signed)
Past Hx of shoulder pain I think this episode triggered by her fall Exam suggest traumatic bursitis  High risk RC tear with her age so we will recheck in 8 days  Plan ROM exercises only Keep up std meds and add meloxicam 7.5 mgm per day  [place] of shoulder on RTC

## 2011-05-18 NOTE — Progress Notes (Signed)
  Subjective:    Patient ID: Michelle Sims, female    DOB: 05-06-1929, 78 y.o.   MRN: [id number]  HPI Upper back is much better.  Rt buttocks is much better.  Lt anterior shoulder pain- which has been worse x 2 weeks.  Also has rt shoulder pain periodically. Larey Seat about 11 days ago- going into the house.  Mr. Dorner was trying to catch her and fell on top of her she thinks. Had first visit with Neuro PT yesterday to work on gait and balance.  Taking gabapentin 100 mg qam, 100 qpm, and 200 qhs.  Tramadol 50 mg tid.    Review of Systems     Objective:   Physical Exam NAD No scapular winging at rest Painful arc on lt above 90 degrees Some limitation of full elevation, but equal bilat Pain with forward flexion above 90 deg, equal bilat ER causes pain on left but not rt ER and IR strong at waist level Abduction strength good Speeds negative Yergason's negative BT groove slightly tender on lt, not on rt Empty can negative bilat Hawkins positive        Assessment & Plan:

## 2011-05-20 ENCOUNTER — Ambulatory Visit: Payer: BC Managed Care – PPO | Admitting: Physical Therapy

## 2011-05-24 ENCOUNTER — Ambulatory Visit: Payer: BC Managed Care – PPO | Admitting: Physical Therapy

## 2011-05-24 ENCOUNTER — Ambulatory Visit: Payer: BC Managed Care – PPO | Admitting: Family Medicine

## 2011-05-26 ENCOUNTER — Ambulatory Visit (INDEPENDENT_AMBULATORY_CARE_PROVIDER_SITE_OTHER): Payer: BC Managed Care – PPO | Admitting: Family Medicine

## 2011-05-26 ENCOUNTER — Ambulatory Visit: Payer: BC Managed Care – PPO | Attending: Neurology | Admitting: Physical Therapy

## 2011-05-26 ENCOUNTER — Encounter: Payer: Self-pay | Admitting: Family Medicine

## 2011-05-26 VITALS — BP 147/84 | HR 77

## 2011-05-26 DIAGNOSIS — M7542 Impingement syndrome of left shoulder: Secondary | ICD-10-CM

## 2011-05-26 DIAGNOSIS — M719 Bursopathy, unspecified: Secondary | ICD-10-CM

## 2011-05-26 DIAGNOSIS — M67919 Unspecified disorder of synovium and tendon, unspecified shoulder: Secondary | ICD-10-CM

## 2011-05-26 DIAGNOSIS — M7520 Bicipital tendinitis, unspecified shoulder: Secondary | ICD-10-CM

## 2011-05-26 DIAGNOSIS — M67922 Unspecified disorder of synovium and tendon, left upper arm: Secondary | ICD-10-CM

## 2011-05-26 DIAGNOSIS — IMO0001 Reserved for inherently not codable concepts without codable children: Secondary | ICD-10-CM | POA: Insufficient documentation

## 2011-05-26 DIAGNOSIS — R262 Difficulty in walking, not elsewhere classified: Secondary | ICD-10-CM | POA: Insufficient documentation

## 2011-05-26 NOTE — Patient Instructions (Signed)
Continue mobic Continue RTC jobes exercises Continue current medications. F/U in 4 weeks.

## 2011-05-26 NOTE — Progress Notes (Signed)
Subjective:    Patient ID: Michelle Sims, female    DOB: Dec 27, 1929, 78 y.o.   MRN: [id number]  HPI  Coming to F/U on her left shoulder pain, her pain has improved, she is taking mobic and doing exercises for the left shoulder. She has improved her function, she denies any numbness or tingling.No night pain.  Patient Active Problem List  Diagnoses  . DEPRESSION  . HYPERTENSION  . CELLULITIS  . DEGENERATIVE JOINT DISEASE, RIGHT KNEE  . SHOULDER PAIN, LEFT  . Right hip pain  . DEGENERATIVE DISC DISEASE, CERVICAL SPINE, W/RADICULOPATHY  . BACK PAIN, THORACIC REGION, CHRONIC  . LUMBAGO  . METATARSALGIA  . FATIGUE  . BREAST CANCER, HX OF  . THYROID CANCER, HX OF  . MASTECTOMY, RIGHT, HX OF  . THYROIDECTOMY, HX OF  . NECK PAIN  . Hypertension  . Blood type, Rh negative  . Thyroid cancer  . Endocervical polyp  . At high risk for falls   Current Outpatient Prescriptions on File Prior to Visit  Medication Sig Dispense Refill  . amLODipine (NORVASC) 5 MG tablet Take 5 mg by mouth daily.        . Ascorbic Acid (VITAMIN C PO) Take 1 tablet by mouth daily.        . clonazePAM (KLONOPIN) 0.5 MG tablet Take 1/4 tab qhs      . fish oil-omega-3 fatty acids 1000 MG capsule Take 2 g by mouth daily.        Marland Kitchen GABAPENTIN, PHN, PO Take by mouth.      . irbesartan (AVAPRO) 75 MG tablet Take 75 mg by mouth daily.      Marland Kitchen LEVOTHYROXINE SODIUM PO Take by mouth. Take 175 mcg po 5 days per week, and 150 mg po 2 days per week.      . meloxicam (MOBIC) 7.5 MG tablet Take 1-2 tablets per day with food.  60 tablet  1  . raloxifene (EVISTA) 60 MG tablet Take 1 tablet (60 mg total) by mouth daily.  90 tablet  4  . Sertraline HCl (ZOLOFT PO) Take by mouth.      . traMADol (ULTRAM) 50 MG tablet Take 50 mg by mouth Every 6 hours as needed.       Allergies  Allergen Reactions  . Ciprofloxacin   . Prednisone     Make her swell   . Sulfamethoxazole W-Trimethoprim      Review of Systems    Constitutional: Negative for fever, chills, diaphoresis and fatigue.  Musculoskeletal: Negative for back pain, joint swelling, arthralgias and gait problem.  Neurological: Negative for weakness and numbness.       Objective:   Physical Exam  Constitutional: She is oriented to person, place, and time. She appears well-developed and well-nourished.       BP 147/84  Pulse 77   Pulmonary/Chest: Effort normal.  Musculoskeletal:       Right shoulder with intact the skin, no swelling, no hematomas.  FROM with pain at the extremes of flexion and abduction.  Neer and Hawkin test positive rotator cuff impingement. Empty can test negative for a  Complete RTC tear but it produces pain . TTP in the [facility] joint Drop of test negative for complete tear of the rotator cuff. Strength 4/5 her external rotation 5 over 5 for internal rotation. No tenderness on palpation at the bicipital groove. Speed test negative for biceps tendinitis. Yergason test negative for biceps tendinitis. Sensation intact on the lateral aspect of  the shoulder and distally.    Neurological: She is alert and oriented to person, place, and time.  Skin: Skin is warm. No rash noted. No erythema.  Psychiatric: She has a normal mood and affect. Her behavior is normal. Thought content normal.      MSK U/S : Biceps  Tendinitis with hypoechoic halo around the biceps tendon, partial tear of the rotator cuff based on foot print irregularities. Mild calcification of the supraspinatus.     Assessment & Plan:   1. Rotator cuff impingement syndrome, left   2. Biceps tendinopathy, left    Continue mobic Continue RTC jobes exercises F/U in 4 weeks.

## 2011-05-27 ENCOUNTER — Other Ambulatory Visit: Payer: Self-pay | Admitting: *Deleted

## 2011-05-27 MED ORDER — GABAPENTIN 100 MG PO CAPS: 100.0000 mg | ORAL_CAPSULE | Freq: Three times a day (TID) | ORAL | Status: DC

## 2011-05-31 ENCOUNTER — Ambulatory Visit: Payer: BC Managed Care – PPO | Admitting: Physical Therapy

## 2011-05-31 ENCOUNTER — Encounter: Payer: Self-pay | Admitting: Sports Medicine

## 2011-06-01 ENCOUNTER — Ambulatory Visit: Payer: BC Managed Care – PPO | Admitting: Sports Medicine

## 2011-06-03 ENCOUNTER — Ambulatory Visit: Payer: BC Managed Care – PPO | Admitting: Physical Therapy

## 2011-06-07 ENCOUNTER — Ambulatory Visit: Payer: BC Managed Care – PPO | Admitting: Physical Therapy

## 2011-06-07 ENCOUNTER — Telehealth: Payer: Self-pay | Admitting: *Deleted

## 2011-06-07 DIAGNOSIS — N390 Urinary tract infection, site not specified: Secondary | ICD-10-CM

## 2011-06-07 NOTE — Telephone Encounter (Signed)
Stop Macrodantin. Bring in urinalysis on Thursday. It should be both for urinalysis and urine culture. If urinary symptoms restart call back.

## 2011-06-07 NOTE — Telephone Encounter (Signed)
Pt informed with the below note, order placed in computer. 

## 2011-06-07 NOTE — Telephone Encounter (Signed)
Pt said that she spoke with you on Saturday (you were on call) regarding burning with urination and called Rx for macrodantin called into pharmacy. Pt said she feels worse with taking medication, she thinks that all her other medication combined with the macrodantin has made her feel this way. She no longer has burning or other UTI symptoms, pt would like to stop macrodantin and drop u/a off sometime this week. Okay to drop off u/a? Please advise

## 2011-06-07 NOTE — Telephone Encounter (Signed)
Addended by: Aura Camps on: 06/07/2011 10:14 AM   Modules accepted: Orders

## 2011-06-09 ENCOUNTER — Other Ambulatory Visit: Payer: BC Managed Care – PPO

## 2011-06-09 DIAGNOSIS — N390 Urinary tract infection, site not specified: Secondary | ICD-10-CM

## 2011-06-10 ENCOUNTER — Ambulatory Visit: Payer: BC Managed Care – PPO | Admitting: Physical Therapy

## 2011-06-10 LAB — URINALYSIS W MICROSCOPIC + REFLEX CULTURE
Bacteria, UA: NONE SEEN
Casts: NONE SEEN
Glucose, UA: NEGATIVE mg/dL
Hgb urine dipstick: NEGATIVE
Ketones, ur: NEGATIVE mg/dL
Nitrite: NEGATIVE
Protein, ur: NEGATIVE mg/dL
pH: 7.5 (ref 5.0–8.0)

## 2011-06-28 ENCOUNTER — Encounter: Payer: Self-pay | Admitting: Sports Medicine

## 2011-06-28 ENCOUNTER — Ambulatory Visit (INDEPENDENT_AMBULATORY_CARE_PROVIDER_SITE_OTHER): Payer: BC Managed Care – PPO | Admitting: Sports Medicine

## 2011-06-28 ENCOUNTER — Other Ambulatory Visit: Payer: Self-pay | Admitting: Sports Medicine

## 2011-06-28 VITALS — BP 146/89 | HR 82 | Ht 63.0 in | Wt 130.0 lb

## 2011-06-28 DIAGNOSIS — R5381 Other malaise: Secondary | ICD-10-CM

## 2011-06-28 DIAGNOSIS — R5383 Other fatigue: Secondary | ICD-10-CM

## 2011-06-28 DIAGNOSIS — Z9181 History of falling: Secondary | ICD-10-CM

## 2011-06-28 DIAGNOSIS — M25519 Pain in unspecified shoulder: Secondary | ICD-10-CM

## 2011-06-28 NOTE — Assessment & Plan Note (Signed)
She is to continue range of motion exercises for her left shoulder.  We will reevaluate this if her symptoms worsen.

## 2011-06-28 NOTE — Patient Instructions (Signed)
Continue taking tramadol three times per day  For the next week start taking gabapentin 100 mg in the morning and at bedtime  The next week start taking gabapentin 100 mg at bedtime- continue this for the next 4-6 weeks then follow up with [place]  Monitor your energy level after coming down on the gabapentin  Keep up the tramadol 3 times per day  Get back into your exercise program - steadily -but if you are too tired just do a shorter session  I would let Dr Kevan Ny decide whether you really need another antidepressant or if we can start weaning off some of the medications  Start with using some debrox to soften ear wax

## 2011-06-28 NOTE — Assessment & Plan Note (Signed)
We will try to wean down the gabapentin in case this is causing some of the fatigue. Since she has a combination of factors including thyroid disease and multiple other medications I think she should try to let [name] Cornick most of her medications and make a decision about whether to wean them further or to add back an antidepressant.  She is sleeping well so I don't think it would change the nighttime gabapentin or clonazepam.

## 2011-06-28 NOTE — Progress Notes (Signed)
  Subjective:    Patient ID: Michelle Sims, female    DOB: 1929/02/10, 78 y.o.   MRN: [id number]  HPI  Pt presents to clinic for f/u of left shoulder pain which has not significantly improved.  Still has pain with reaching behind her and abduction and elevation.  Rotator cuff exercises- IR and ER are painful for her. She states that the left shoulder pain is something she can manage. Fortunately she has not had any recurrence of upper back pain since we started her back on tramadol and gabapentin.  She is concerned that gabapentin 100 mg and tramadol 50 mg tid are contributing to her fatigue.  She also wants to discuss her ear being stopped up since airplane trip. Psychiatrist Dr. Donell Beers stopped zoloft and started remeron which made her very drowsy, and she could not tolerate it, he stopped this also.  She is now on nothing for depression, and is feeling fatigued and disorganized.    Dr. Sandria Manly ordered a Datascan to dx parkinson's vs essential primary vs intension tremor.  She has noticed a slight tremor, hand writing has deteriorated. This tremor has been relatively stable for the past 2 years. She is not noticing more shuffling of her gait and on review of her history it seems that her past falls are probably been more related to her arthritis of both knees.   Review of Systems     Objective:   Physical Exam Anxious but not in acute distress  No cog wheeling on rt, some tightness of left bicep with flexion and extension at elbow Fine tremor on rt 20 per second, minimal on lt Dynamic genuvalgus on rt with walking Not unstable with walking, turns around with 1 extra step No shuffling or stiffness noted  Left shoulder motion is stable and she has some limitation of back scratch on the left but is overall improved.  Left external ear canal shows wax impaction not present on the right.         Assessment & Plan:

## 2011-06-28 NOTE — Assessment & Plan Note (Signed)
She completed physical therapy for gait training. They felt she may progress with this and should be on a lower risk of falls. The real question that she strained her son with her husband is with her to continue her workup for Parkinson's disease. This would require a PET scan and that is a fairly expensive test since it's not covered by her insurance.  They will discuss this further with [name] and Dr. love. I did not observe classic arc and some changes on watching her walking gait.

## 2011-07-12 ENCOUNTER — Telehealth: Payer: Self-pay | Admitting: *Deleted

## 2011-07-12 NOTE — Telephone Encounter (Signed)
Message copied by Mora Bellman on Tue Jul 12, 2011  4:41 PM ------      Message from: Lizbeth Bark      Created: Tue Jul 12, 2011 11:19 AM      Regarding: phone message      Contact: [phone]       Pt would like to talk to you about her back pain, it came back.

## 2011-07-12 NOTE — Telephone Encounter (Signed)
Pt states that the upper back pain that she was initially treated for here returned last night and is still there this morning.  She has been weaning gabapentin down over the past 2 weeks and is now on 100 mg qhs.  Per Dr. Darrick Penna- advised pt to increase gabapentin to 100 mg bid, and if pain was not better after 1 week increase back to tid.  Pt agreeable.

## 2011-07-27 ENCOUNTER — Encounter: Payer: Self-pay | Admitting: Sports Medicine

## 2011-07-27 ENCOUNTER — Ambulatory Visit (INDEPENDENT_AMBULATORY_CARE_PROVIDER_SITE_OTHER): Payer: BC Managed Care – PPO | Admitting: Sports Medicine

## 2011-07-27 VITALS — BP 142/88 | HR 66

## 2011-07-27 DIAGNOSIS — M25559 Pain in unspecified hip: Secondary | ICD-10-CM

## 2011-07-27 DIAGNOSIS — M25551 Pain in right hip: Secondary | ICD-10-CM

## 2011-07-27 DIAGNOSIS — M5020 Other cervical disc displacement, unspecified cervical region: Secondary | ICD-10-CM

## 2011-07-27 NOTE — Progress Notes (Signed)
  Subjective:    Patient ID: Michelle Sims, female    DOB: 1929-06-14, 78 y.o.   MRN: [id number]  HPI Now has a recurrence of upper thoracic back pain She had been weaning gabapentin Came back from beach and doing heavy laundry Started increasing her tramadol and gabapentin back to tid  Also has pain over RT buttocks Worse after sitting Walking helps but first steps are painful  Left shoulder remains stiffer and hurts anterior portion   Review of Systems     Objective:   Physical Exam  Pain over lt periscapular area Limited neck ROM in all directions No scapular winging  Lt shoulder exam Speed's, yergason's, obrein's negative on lt Limited IR and some anterior pain on lt  Cross over test mildly positive Empty can neg  Hip rotation and abduction strength good on rt       Assessment & Plan:

## 2011-07-27 NOTE — Patient Instructions (Addendum)
Please shoulder exercises- daily  Standing hip rotations, leg swings to the front and back, and side to side  Wear your neck collar a couple times per day for about an hour  Please follow up in 1 month  Thank you for seeing [place] today!

## 2011-07-27 NOTE — Assessment & Plan Note (Addendum)
Flare of her cervical radiculopathy Consent obtained and verified. Sterile prep. Furthur cleansed with alcohol. Topical analgesic spray: Ethyl chloride. Joint: left parascapular mm Approached in typical fashion with: 4 trigger point injections along border scapula Completed without difficulty Meds: 1% lidocaine Needle: 30 G and 5/8 in  Patient had pain free movement post injection  Keep up tramadol and gabapentin  Restart scap exer series Aftercare instructions and Red flags advised.

## 2011-07-27 NOTE — Assessment & Plan Note (Signed)
Begin a series of easy exercises  This seems muscular Hip joint motion is good  Reck in 1 month

## 2011-08-02 ENCOUNTER — Ambulatory Visit: Payer: BC Managed Care – PPO | Admitting: Sports Medicine

## 2011-08-03 ENCOUNTER — Other Ambulatory Visit: Payer: Self-pay | Admitting: *Deleted

## 2011-08-03 MED ORDER — TRAMADOL HCL 50 MG PO TABS: 50.0000 mg | ORAL_TABLET | Freq: Four times a day (QID) | ORAL | Status: DC | PRN

## 2011-08-11 ENCOUNTER — Ambulatory Visit: Payer: BC Managed Care – PPO | Admitting: Sports Medicine

## 2011-08-18 ENCOUNTER — Ambulatory Visit (INDEPENDENT_AMBULATORY_CARE_PROVIDER_SITE_OTHER): Payer: BC Managed Care – PPO | Admitting: Sports Medicine

## 2011-08-18 ENCOUNTER — Ambulatory Visit: Payer: BC Managed Care – PPO | Admitting: Sports Medicine

## 2011-08-18 VITALS — BP 133/85

## 2011-08-18 DIAGNOSIS — M25519 Pain in unspecified shoulder: Secondary | ICD-10-CM

## 2011-08-18 DIAGNOSIS — M25551 Pain in right hip: Secondary | ICD-10-CM

## 2011-08-18 DIAGNOSIS — M25559 Pain in unspecified hip: Secondary | ICD-10-CM

## 2011-08-18 DIAGNOSIS — M5020 Other cervical disc displacement, unspecified cervical region: Secondary | ICD-10-CM

## 2011-08-18 NOTE — Assessment & Plan Note (Signed)
This has flared since we weaned her gabapentin  We will try to steadily build this back up and see if her pain lessens  Build over next mo and reck

## 2011-08-18 NOTE — Assessment & Plan Note (Signed)
Try easy motion exercises as described  Reck on RTC and consider scan with [place]

## 2011-08-18 NOTE — Progress Notes (Signed)
  Subjective:    Patient ID: Michelle Sims, female    DOB: 03/16/1929, 78 y.o.   MRN: [id number]  HPI Pt comes today for follow up back pain, left shoulder pain and right buttock pain. 1. Back pain: she describes that wakes up pain free and early in the morning somthing triggers it. It's initially felt as tingling sensation and then the pain comes. Pain is localized on mid peri-scapular area and does not irradiate. She has had X 2 injections with no pain relieve. She also has not been compliant with collar and inconsistent with recommended exercises.  She is taking gabapentin 100 mg TID with no side effects noted.  2.Left Shoulder pain: Pain with abduction, internal and external rotation of left shoulder. Pt also afraid to move arm for fear to reproduce back pain. She is taking tramadol 50 mg TID and pain/ function has not improved since prior visit.   3. Right  Buttock pain: unchanged from prior visit. Intermittent, worse when sitting or getting up. No irradiation. No weakness, tingling or numbness of LE reported. Denies urinary or fecal incontinence. Pt has not been compliant with hip/leg exercises.    Pt declines to take more medications that could potentially make her sleepy since she is an active person and dislikes the drowsiness feeling.   She also complaints of feeling tired and fatigued since she wakes up. Reports lack of energy during the day. She is sad because of the limitation of function secondary to her chronic conditions. She has been on 6 different SSRI's in the past, that were discontinued by psychiatry since none of them seemed to help. She is now on talk therapy but this helps her partially.   Review of Systems Per HPI    Objective:   Physical Exam Constitutional: NAD, mildly depressed mood. MSK: Back:  Posture: 10 degree flexion of cervical spine. Tender trigger point located on left subscapular area. No erythema. No winged scapula on this exam.           Left Shoulder:  limit ROM active and passive. Decrease abduction, internal and external rotation of left shoulder. No joint laxity. No erythema or effusion noted.          Lumbar spine: right buttocks area tender to palpation. Strength 4/5 symmetrical. Slightly decreased reflexes bilaterally.        Assessment & Plan:

## 2011-08-18 NOTE — Patient Instructions (Addendum)
It has been a pleasure to see you today.  Gabapentin take 100 mg twice a day and 300 mg at night for (1)week.   Increase to 200 mg twice a day and 300 mg at night for two(2) weeks.    Increase to 300 mg three times a day.   If at any point the pain is relieved or you are getting very sedated stay with the prior dose and follow up.    Exercises:   Forward table stretches.  Left hug stretches  20 seconds 5 times a day.   Next appointment in a month.

## 2011-08-18 NOTE — Assessment & Plan Note (Signed)
Try some massage to this area and see if this lessens pain

## 2011-08-23 ENCOUNTER — Ambulatory Visit: Payer: BC Managed Care – PPO | Admitting: Sports Medicine

## 2011-09-19 ENCOUNTER — Ambulatory Visit (INDEPENDENT_AMBULATORY_CARE_PROVIDER_SITE_OTHER): Payer: BC Managed Care – PPO | Admitting: Sports Medicine

## 2011-09-19 ENCOUNTER — Encounter: Payer: Self-pay | Admitting: Sports Medicine

## 2011-09-19 VITALS — BP 151/83 | HR 66 | Ht 63.0 in | Wt 130.0 lb

## 2011-09-19 DIAGNOSIS — M5020 Other cervical disc displacement, unspecified cervical region: Secondary | ICD-10-CM

## 2011-09-19 DIAGNOSIS — R5383 Other fatigue: Secondary | ICD-10-CM

## 2011-09-19 DIAGNOSIS — R5381 Other malaise: Secondary | ICD-10-CM

## 2011-09-19 DIAGNOSIS — M25559 Pain in unspecified hip: Secondary | ICD-10-CM

## 2011-09-19 DIAGNOSIS — M25551 Pain in right hip: Secondary | ICD-10-CM

## 2011-09-19 NOTE — Assessment & Plan Note (Signed)
Keep up HEP  Soon shoujld return to water aerobics

## 2011-09-19 NOTE — Progress Notes (Signed)
  Subjective:    Patient ID: Michelle Sims, female    DOB: 04/21/1929, 78 y.o.   MRN: [id number]  HPI  67 yof with history of cervical sopndylosis, lumbar DDD, and chronic fatigue presents for follow up of back pain.  Patient reports she has been pain free for the past three weeks.  She is currently taking tramadol 25mg  tid and gabapentin 100tid.  She has recently been lowering the doses of the medications because they may be contributing to her fatigue.  She is wondering what exercises she should be doing for prevention.    She continues to have right buttocks pain.  It is in the deep gluteal region and at times seems to radiate to the posterior aspect of the leg and into the knee.  It is exacerbated by sitting.    The patient complains of persistent fatigue and just feeling "off".  She feels like her balance is poor.  She has been evaluated by neurology secondary to tremors and is being worked up for Parkinsons.  She participated in their neurophysical therapy program.  She does exercises approximately once per week.  She enjoys doing water aerobics however has not been participating recently secondary to a cut on her arm.      Review of Systems Occasional knee pain.      Objective:   Physical Exam Gen - alert and oriented, nad  HEENT - ncat, mmm, EOMI, nl conjunctiva  Resp - respirations non-labored Psych - flat affect MS: Back: Nontender to palpation over cervical spine, upper thoracic area, or scapula. Nontender to palpation over lumbar spinous processes and SI joints. Pain with deep palpation over piriformis, R > L. Right hip with good ROM. Hip flexion and abduction strength 4/5. FABER testing causes pain in low back. Negative straight leg raise testing.        Assessment & Plan:  43 yof with history of degenerative disk disease presents for follow up of mid-thoracic back pain and right buttock pain.  1.  Back pain. -  Secondary to degenerative disk disease and  radiculopathy.   -  Resolved currently.   -  Needs to continue gabapentin, as this is likely the medication that is treating her neuropathic pain. -  Wean off tramadol to determine if this is contributing to her chronic fatigue.   -  Reviewed home exercise program.  2.  Right gluteal pain. -  Consistent with musculoskeletal etiology, piriformis and weak abductors as etiology.   -  Home exercise plan provided. -  Recommended seat cushion for car to elevate bottom above knees.

## 2011-09-19 NOTE — Patient Instructions (Addendum)
Leg swings -  Forward and back Side to side  Hip rotations - Lift hip up, out, back to the middle, and down.    Continue cross table stretches.   Pull shoulders back. Neck stretch, holding it straight up. Use the neck brace for one hour each afternoon to rest your neck.  Continue current regimen of gabapentin 100mg  three times per day.   In one week, decrease the tramadol to 1/2 tablet (25mg ) two times per day. In two weeks, decrease the tramadol to 1/2 tablet (25mg ) each morning.  Follow up in 6 weeks.

## 2011-09-19 NOTE — Assessment & Plan Note (Signed)
This is back in control  Discourage her from weaning gabapentin this time as she has flared both times she tried

## 2011-09-19 NOTE — Assessment & Plan Note (Signed)
Etiology unclear  Cont to work with psychiatry to see if any mood medication needed  Reck 2 mos to me

## 2011-09-21 ENCOUNTER — Ambulatory Visit (INDEPENDENT_AMBULATORY_CARE_PROVIDER_SITE_OTHER): Payer: BC Managed Care – PPO | Admitting: Obstetrics and Gynecology

## 2011-09-21 DIAGNOSIS — R140 Abdominal distension (gaseous): Secondary | ICD-10-CM

## 2011-09-21 DIAGNOSIS — R141 Gas pain: Secondary | ICD-10-CM

## 2011-09-21 NOTE — Patient Instructions (Signed)
Schedule ultrasound

## 2011-09-21 NOTE — Progress Notes (Signed)
Patient came to see me today because of a history of abdominal bloating mostly below the naval. She feels that her clothes are too tight and her abdomen swells. She is not having nausea or vomiting. She has not had a change in her bowel habits. She's not had vaginal bleeding. She had similar symptoms approximately 6 months ago and had a normal ultrasound in our office. She is up-to-date on colonoscopy.  Exam: Michelle Sims present. Abdomen is soft although appears just slightly distended. There is no evidence of ascites. There is no guarding or rebound.Pelvic exam: External within normal limits. BUS within normal limits. Vaginal exam within normal limits. Her atrophic changes are much improved with vaginal estrogen although there still is some synechiae. Cervix is clean without lesions. Uterus is normal size and shape. Adnexa failed to reveal masses. Rectovaginal examination is confirmatory and without masses.   Assessment: Abdominal bloating  Plan: I think we are obligated to repeat her ultrasound since it has been  6 months especially in a woman who has had both breast and thyroid cancer. Assuming all is normal she will go see Dr. Clent Sims who is her gastroenterologist as I suspect this is a gastrointestinal problem. She is also complaining of fatigue. This has already been thoroughly evaluated by Dr. Kevan Sims.

## 2011-09-22 ENCOUNTER — Telehealth: Payer: Self-pay | Admitting: *Deleted

## 2011-09-22 NOTE — Telephone Encounter (Signed)
Pt called requesting to have a appointment with Dr.Bucinni sooner than October 1,2013. I called his office and none thing was available before this pt will be placed on cancellation list. Pt informed with this as well.

## 2011-09-28 ENCOUNTER — Ambulatory Visit (INDEPENDENT_AMBULATORY_CARE_PROVIDER_SITE_OTHER): Payer: BC Managed Care – PPO | Admitting: Obstetrics and Gynecology

## 2011-09-28 ENCOUNTER — Ambulatory Visit (INDEPENDENT_AMBULATORY_CARE_PROVIDER_SITE_OTHER): Payer: BC Managed Care – PPO

## 2011-09-28 ENCOUNTER — Telehealth: Payer: Self-pay | Admitting: *Deleted

## 2011-09-28 DIAGNOSIS — R140 Abdominal distension (gaseous): Secondary | ICD-10-CM

## 2011-09-28 DIAGNOSIS — D219 Benign neoplasm of connective and other soft tissue, unspecified: Secondary | ICD-10-CM

## 2011-09-28 DIAGNOSIS — N83339 Acquired atrophy of ovary and fallopian tube, unspecified side: Secondary | ICD-10-CM

## 2011-09-28 DIAGNOSIS — R141 Gas pain: Secondary | ICD-10-CM

## 2011-09-28 DIAGNOSIS — D251 Intramural leiomyoma of uterus: Secondary | ICD-10-CM

## 2011-09-28 DIAGNOSIS — D259 Leiomyoma of uterus, unspecified: Secondary | ICD-10-CM

## 2011-09-28 DIAGNOSIS — R143 Flatulence: Secondary | ICD-10-CM

## 2011-09-28 DIAGNOSIS — N858 Other specified noninflammatory disorders of uterus: Secondary | ICD-10-CM

## 2011-09-28 DIAGNOSIS — N859 Noninflammatory disorder of uterus, unspecified: Secondary | ICD-10-CM

## 2011-09-28 NOTE — Telephone Encounter (Signed)
Message copied by Mora Bellman on Wed Sep 28, 2011 12:07 PM ------      Message from: CERESI, MELANIE L      Created: Wed Sep 28, 2011 11:21 AM      Regarding: phone message      Contact: [phone]       Andrey Campanile would like you to call her before lunch. She has been waiting for your call.

## 2011-09-28 NOTE — Progress Notes (Signed)
Patient came back to see me today for pelvic ultrasound. She has an anteverted uterus with a small fibroid which is less than 2 cm. The fibroid is unchanged from previous ultrasound. Her endometrial echo is 4.9 mm and there is fluid in the endometrial cavity. She has had no vaginal bleeding. Her cervix is also fluid-filled with a small echogenic focus. She does have a history of polyps. Neither ovary can be seen but there are no masses in the adnexa. There is no cul-de-sac fluid. There is no acidic fluid seen.  Assessment: Abdominal bloating. Small fibroid.  Plan: Patient reassured about ovaries. She already  has an appointment to see [name].

## 2011-09-28 NOTE — Telephone Encounter (Signed)
Pt states her back pain returned last night- she has been weaning off of tramadol - now on 25 mg bid.  Per Dr. Darrick Penna- advised pt to go back to 50 mg tid.  Pt agreeable.

## 2011-09-29 ENCOUNTER — Telehealth: Payer: Self-pay | Admitting: *Deleted

## 2011-09-29 ENCOUNTER — Ambulatory Visit (INDEPENDENT_AMBULATORY_CARE_PROVIDER_SITE_OTHER): Payer: BC Managed Care – PPO | Admitting: Obstetrics and Gynecology

## 2011-09-29 DIAGNOSIS — R300 Dysuria: Secondary | ICD-10-CM

## 2011-09-29 LAB — URINALYSIS W MICROSCOPIC + REFLEX CULTURE
Nitrite: NEGATIVE
Protein, ur: NEGATIVE mg/dL

## 2011-09-29 MED ORDER — NITROFURANTOIN MONOHYD MACRO 100 MG PO CAPS: 100.0000 mg | ORAL_CAPSULE | Freq: Two times a day (BID) | ORAL | Status: AC

## 2011-09-29 MED ORDER — PHENAZOPYRIDINE HCL 200 MG PO TABS: 200.0000 mg | ORAL_TABLET | Freq: Three times a day (TID) | ORAL | Status: DC | PRN

## 2011-09-29 NOTE — Telephone Encounter (Signed)
yes

## 2011-09-29 NOTE — Telephone Encounter (Signed)
Pt informed with the below. rx sent.

## 2011-09-29 NOTE — Telephone Encounter (Signed)
Pt called c/o vaginal burning that is much worse since leaving the office. She has taken 1 pill of the Macrobid, but asked for something else to help with the burning. Please advise

## 2011-09-29 NOTE — Telephone Encounter (Signed)
Pt walked into the office, appointment has been made at 11:00am

## 2011-09-29 NOTE — Telephone Encounter (Signed)
Pt has pelvic ultrasound yesterday, c/o vaginal burning this am, burning after urination only. No back pain no other symptoms. Okay to order u/a? Please advise

## 2011-09-29 NOTE — Telephone Encounter (Signed)
Pyridium 200 mg 3 times a day. Warn her it'll turn her urine red.

## 2011-09-29 NOTE — Progress Notes (Signed)
Patient woke up this morning and noticed dysuria. She continues to void and the dysuria persisted. She does have some urinary frequency. She does not have hematuria or urgency. She was in our office yesterday for pelvic ultrasound and wasn't sure if there was  some relationship.  Urinalysis done which showed 11-20 white blood cells per high-power field, few bacteria, and 0 -2 blood cells per high-power field.  Assessment: Urinary tract infection  Plan: Macrobid twice a day with food for 7 days. Followup urinalysis. Urine culture done.

## 2011-09-29 NOTE — Addendum Note (Signed)
Addended by: Aura Camps on: 09/29/2011 12:16 PM   Modules accepted: Orders

## 2011-09-29 NOTE — Patient Instructions (Signed)
Return for urinalysis after you Finish the antibiotics.

## 2011-10-03 ENCOUNTER — Encounter: Payer: Self-pay | Admitting: Obstetrics and Gynecology

## 2011-10-03 ENCOUNTER — Other Ambulatory Visit: Payer: Self-pay | Admitting: Obstetrics and Gynecology

## 2011-10-03 ENCOUNTER — Telehealth: Payer: Self-pay | Admitting: Obstetrics and Gynecology

## 2011-10-03 ENCOUNTER — Ambulatory Visit: Payer: BC Managed Care – PPO | Admitting: Family Medicine

## 2011-10-03 DIAGNOSIS — N390 Urinary tract infection, site not specified: Secondary | ICD-10-CM

## 2011-10-03 LAB — URINE CULTURE: Colony Count: >=100000

## 2011-10-03 MED ORDER — AMOXICILLIN 500 MG PO CAPS: 500.0000 mg | ORAL_CAPSULE | Freq: Three times a day (TID) | ORAL | Status: AC

## 2011-10-03 NOTE — Telephone Encounter (Signed)
Patient c/o nausea with the RX for bladder that you prescribed.  So nauseated cannot go out this morning as planned. Wondered if there is an "antedote for this nausea I can take or if I can just discontinue it?".

## 2011-10-03 NOTE — Telephone Encounter (Signed)
Patient called back and asked if she should recheck her urine when finished with Amoxicillin.  I told her you would want to recheck it and I put order in for urine culture. Correct?

## 2011-10-03 NOTE — Telephone Encounter (Signed)
Does she think it's  the antibiotic or the pyridium? Call and see what antibiotics her culture showed sensitivity to.

## 2011-10-03 NOTE — Telephone Encounter (Signed)
yes

## 2011-10-03 NOTE — Telephone Encounter (Signed)
I called patient back. She said it is the antibiotic. She only took one Pyridium the first day.  I told her you asked me to check on sensitivities and I have put a call in to the lab and am waiting to get the info back on that.  I told her after you review that you can better advise her what to do regarding antibiotic.  She knows I will call her this afternoon.

## 2011-10-03 NOTE — Telephone Encounter (Signed)
Patient informed that Dr. Reece Agar received sensitivity and reviewed and recommended that she d/c Macrobid.  He wants her to take Amoxicillin 500 mg #21 tid x 7 days.  I e-scribed that and per Dr. Reece Agar added Macrobid to her allergy list and noted contraindicated due to nausea.

## 2011-10-05 ENCOUNTER — Telehealth: Payer: Self-pay | Admitting: Obstetrics and Gynecology

## 2011-10-05 NOTE — Telephone Encounter (Signed)
Patient stopped by to bring Dr. Reece Agar and envelope from her husband. She asked to speak with me and told me that she wondered if the Amoxicillin not agreeing with her.  She said she is feeling tired and weak and a slight discomfort in her vagina.  She has no symptoms of yeast inf.  Of note, the patient said per GI MD she started drinking Citracel mix beginning of the week too and it makes her feel full.  She said she is going to stop it until she finishes Amoxicillin so she can tell which one is causing what symptoms.  Dr. Reece Agar said it is very important that she take the antibiotic and get rid of this infection.  Usually side affect with Amoxicillin is diarrhea.  Patient has no diarrhea.  She will d/c Citracel for now and continue Amoxicillin per Dr. Reece Agar.

## 2011-10-06 ENCOUNTER — Telehealth: Payer: Self-pay | Admitting: *Deleted

## 2011-10-06 NOTE — Telephone Encounter (Signed)
Pt asked if I would relay to you that the nausea with the amoxicillin seems worse. She will be finish with medication on Monday, no vomiting nor diarrhea. She will continue taking as directed and try with food.

## 2011-10-07 ENCOUNTER — Telehealth: Payer: Self-pay | Admitting: *Deleted

## 2011-10-07 ENCOUNTER — Other Ambulatory Visit: Payer: Self-pay | Admitting: Women's Health

## 2011-10-07 DIAGNOSIS — R110 Nausea: Secondary | ICD-10-CM

## 2011-10-07 MED ORDER — ONDANSETRON HCL 4 MG PO TABS: 4.0000 mg | ORAL_TABLET | Freq: Three times a day (TID) | ORAL | Status: AC | PRN

## 2011-10-07 NOTE — Telephone Encounter (Signed)
Telephone call states is feeling a little better, no vomiting has taken 2 tums which have helped. Will continue with amoxicillin. Will call in Zofran 4 mg every 8 hours if needed. Denies any dizziness.

## 2011-10-07 NOTE — Telephone Encounter (Signed)
(  Dr.G patient) pt was given Amoxicillin 500 mg c/o nausea, no vomiting. Pt is requesting something to help with the nausea. Please advise

## 2011-10-14 ENCOUNTER — Other Ambulatory Visit: Payer: BC Managed Care – PPO

## 2011-10-14 ENCOUNTER — Other Ambulatory Visit: Payer: Self-pay | Admitting: *Deleted

## 2011-10-14 DIAGNOSIS — N390 Urinary tract infection, site not specified: Secondary | ICD-10-CM

## 2011-10-14 LAB — URINALYSIS W MICROSCOPIC + REFLEX CULTURE
Crystals: NONE SEEN
Nitrite: NEGATIVE
Specific Gravity, Urine: 1.015 (ref 1.005–1.030)
Urobilinogen, UA: 0.2 mg/dL (ref 0.0–1.0)

## 2011-10-16 LAB — URINE CULTURE

## 2011-10-17 ENCOUNTER — Other Ambulatory Visit: Payer: Self-pay | Admitting: Gynecology

## 2011-10-17 ENCOUNTER — Other Ambulatory Visit: Payer: Self-pay | Admitting: *Deleted

## 2011-10-17 ENCOUNTER — Other Ambulatory Visit: Payer: BC Managed Care – PPO

## 2011-10-17 ENCOUNTER — Encounter: Payer: Self-pay | Admitting: Gynecology

## 2011-10-17 ENCOUNTER — Ambulatory Visit (INDEPENDENT_AMBULATORY_CARE_PROVIDER_SITE_OTHER): Payer: BC Managed Care – PPO | Admitting: Gynecology

## 2011-10-17 ENCOUNTER — Telehealth: Payer: Self-pay | Admitting: *Deleted

## 2011-10-17 VITALS — BP 116/70

## 2011-10-17 DIAGNOSIS — R300 Dysuria: Secondary | ICD-10-CM

## 2011-10-17 LAB — URINALYSIS W MICROSCOPIC + REFLEX CULTURE
Crystals: NONE SEEN
Ketones, ur: NEGATIVE mg/dL
Nitrite: NEGATIVE
Specific Gravity, Urine: 1.015 (ref 1.005–1.030)
Urobilinogen, UA: 0.2 mg/dL (ref 0.0–1.0)

## 2011-10-17 NOTE — Telephone Encounter (Signed)
Discussed with pt.  She will decrease gabapentin to 100 mg bid until her f/u appt 10/26/11.  Then will decrease to 100 mg qhs if no problems weaning.  She takes tramadol twice daily and takes a 3rd as needed. She will continue with her tramadol regimen.

## 2011-10-17 NOTE — Telephone Encounter (Signed)
Message copied by Mora Bellman on Mon Oct 17, 2011 10:15 AM ------      Message from: Enid Baas      Created: Fri Oct 14, 2011  4:36 PM       Let ms Cardon know that I spoke with Dr Lita Mains at [facility] who is very nice.  Dr Lita Mains and I want to have her continue the tramadol at what dose keeps away pain.      Try to wean the Gabapentin down to just 100 at night time.            Dr Lita Mains switched her to celexa for our records

## 2011-10-17 NOTE — Progress Notes (Signed)
Patient is an 78 year old who was seen in the office September 5 by my partner Dr. Eda Paschal with symptoms of dysuria. Her urinalysis demonstrated the following:  11-20 white blood cells per high-power field, few bacteria, and 0 -2 blood cells per high-power field.  Her urine culture came back Escherichia coli sensitive to Macrobid. She was started Macrobid one by mouth twice a day for 7 days but discontinued after the fifth day after she became nauseated. She was switched over to amoxicillin which she stated she took for one week. She returned to the office on September 20 to drop off a urine specimen which only demonstrated mixed bacterial flora.  Patient presented to the office today because she had an isolated event this morning of some dysuria but has since resolved. She denies any fever chills nausea or vomiting. She has multiple antibiotic allergies especially to the following:  Ciprofloxacin Sulfamethoxazole with trimethoprim  Urinalysis today: 7-10 WBC, 1120 RBC, few bacteria culture submitted  Abdomen: Soft nontender no rebound or guarding Back: No CVA tenderness Pelvic: Bartholin urethra Skene glands with atrophic changes.  Assessment/plan: Recently treated for urinary tract infection followup after treatment culture demonstrated mixed bacterial flora. Today's urinalysis with 7-10 WBC, 11-20 RBC, few bacteria culture submitted. Patient states she had some pyridium at home that she can take one 3 times a day as an anti-spasmodic for the next couple of days as we wait for her urine culture.  Now Macrobid

## 2011-10-17 NOTE — Patient Instructions (Addendum)
You can take a Pyridium tablet you have at home 1 every 8 hours for 2 days in the event you may have some bladder spasm. Increase her fluid intake. We will have the results of the urine culture in 2 days.

## 2011-10-20 ENCOUNTER — Telehealth: Payer: Self-pay | Admitting: Gynecology

## 2011-10-20 ENCOUNTER — Other Ambulatory Visit: Payer: Self-pay | Admitting: *Deleted

## 2011-10-20 ENCOUNTER — Other Ambulatory Visit: Payer: Self-pay | Admitting: Gynecology

## 2011-10-20 DIAGNOSIS — N390 Urinary tract infection, site not specified: Secondary | ICD-10-CM

## 2011-10-20 LAB — URINE CULTURE: Colony Count: 45000

## 2011-10-20 MED ORDER — CEFUROXIME AXETIL 500 MG PO TABS: 500.0000 mg | ORAL_TABLET | Freq: Two times a day (BID) | ORAL | Status: DC

## 2011-10-20 NOTE — Telephone Encounter (Signed)
I closed result note for urine culture in error before documenting.  Patient was informed regarding results and need for antibiotic.  She was instructed to recheck urine culture one week after finishing RX.  Order put in system and Rx e-scribed.

## 2011-10-21 ENCOUNTER — Telehealth: Payer: Self-pay | Admitting: Gynecology

## 2011-10-21 NOTE — Telephone Encounter (Signed)
Patient's husband called.  He was wondering about why we called patient yesterday morning and told her urine culture was negative and then we called her in the afternoon and told her it was positive. He said she was confused.  I told him that I was not sure why that happened and I was sorry for the confusion it caused.  I told him that culture did indeed isolate an organism that needed to be treated and was sensitive to Ceftin.  I told him that the last call was indeed correct information and he did feel comfortable with that after speaking with me.  He asked if patient had nausea again with this antibiotic was it okay to used the Zofran that was prescribed several weeks ago when she had nausea with Ampicillin. I told him that would be fine.  He said the only problem was it causes her to be constipated and I suggested a stool softener while she is taking it.

## 2011-10-24 ENCOUNTER — Telehealth: Payer: Self-pay | Admitting: *Deleted

## 2011-10-24 ENCOUNTER — Other Ambulatory Visit: Payer: BC Managed Care – PPO

## 2011-10-24 ENCOUNTER — Ambulatory Visit (INDEPENDENT_AMBULATORY_CARE_PROVIDER_SITE_OTHER): Payer: BC Managed Care – PPO | Admitting: Obstetrics and Gynecology

## 2011-10-24 DIAGNOSIS — N390 Urinary tract infection, site not specified: Secondary | ICD-10-CM

## 2011-10-24 DIAGNOSIS — R110 Nausea: Secondary | ICD-10-CM

## 2011-10-24 LAB — URINALYSIS W MICROSCOPIC + REFLEX CULTURE
Glucose, UA: NEGATIVE mg/dL
Leukocytes, UA: NEGATIVE
Nitrite: NEGATIVE
Protein, ur: NEGATIVE mg/dL
Urobilinogen, UA: 0.2 mg/dL (ref 0.0–1.0)

## 2011-10-24 NOTE — Progress Notes (Signed)
The patient had been started on Cefitin by Dr. Lily Peer for urinary tract infection. She took 4 doses and then noticed severe nausea. She did not have a rash. She called me on Saturday and I told her to stop the Cefitin. She is now become asymptomatic in terms of dysuria. Her nausea actually is getting worse. She is on tramadol and also was impacted on Friday. Although  she lists  Cipro as an allergy she doesn't have a true allergic reaction to it. She does however take Levaquin without any side effects. Today she remains asymptomatic from a urinary point of view. We had her leave a urine both for urinalysis and culture. We will not reinitiate antibiotics into we seen what her culture shows. I think Levaquin would be a good choice if appropriate. She will discuss her nausea with her GI doctor and with Dr. Darrick Penna who gives her tramadol.

## 2011-10-24 NOTE — Patient Instructions (Signed)
Call if urinary symptoms recur. We will call you with culture results.

## 2011-10-24 NOTE — Addendum Note (Signed)
Addended by: Dayna Barker on: 10/24/2011 02:51 PM   Modules accepted: Orders

## 2011-10-24 NOTE — Telephone Encounter (Signed)
Pt called and said she needed an order for UA she talked with Dr Reece Agar on Sat and needed to come on for UA and to d/c medication she was on due to the extreme nausea. Order placed. KW

## 2011-10-25 ENCOUNTER — Ambulatory Visit: Payer: BC Managed Care – PPO | Admitting: Sports Medicine

## 2011-10-26 ENCOUNTER — Encounter: Payer: Self-pay | Admitting: Sports Medicine

## 2011-10-26 ENCOUNTER — Telehealth: Payer: Self-pay | Admitting: Obstetrics and Gynecology

## 2011-10-26 ENCOUNTER — Ambulatory Visit (INDEPENDENT_AMBULATORY_CARE_PROVIDER_SITE_OTHER): Payer: BC Managed Care – PPO | Admitting: Sports Medicine

## 2011-10-26 VITALS — BP 145/84 | HR 65 | Ht 63.0 in | Wt 130.0 lb

## 2011-10-26 DIAGNOSIS — M25551 Pain in right hip: Secondary | ICD-10-CM

## 2011-10-26 DIAGNOSIS — M5020 Other cervical disc displacement, unspecified cervical region: Secondary | ICD-10-CM

## 2011-10-26 DIAGNOSIS — M25519 Pain in unspecified shoulder: Secondary | ICD-10-CM

## 2011-10-26 DIAGNOSIS — M25559 Pain in unspecified hip: Secondary | ICD-10-CM

## 2011-10-26 LAB — URINE CULTURE: Colony Count: 50000

## 2011-10-26 NOTE — Telephone Encounter (Signed)
I received the following staff message from Dr. Eda Paschal in regard to this patient. "Tell patient urinalysis and culture were normal. If she having any urinary symptoms? How is her nausea?"  I spoke with patient and she said all her UTI symptoms and nausea have resolved.  She was very happy to hear that the ur culture was negative.

## 2011-10-26 NOTE — Patient Instructions (Addendum)
Take tramadol when you start noticing back pain- take twice daily. If still having pain after 2 days on tramadol twice daily - it is ok to increase to 3 tramadol tabs per day   If you are having back pain take night time dose of gabapentin 100 mg.  It is ok for you to increase to 2 gabapentin tabs daily if your back pain does not improve after 2 days on the bedtime dose.  If pain is still not better on 2 tabs for 2 days, ok to increase to 3 gabapentin daily.   Try sitting on a tennis ball to massage rt buttocks   Do standing hip rotations and swing leg in clockwise and counter clockwise motions  Please follow up in 6 weeks  Thank you for seeing [place] today!

## 2011-10-26 NOTE — Progress Notes (Signed)
Patient ID: Michelle Sims, female   DOB: [birth year], 78 y.o.   MRN: [id number]  Past week no pain in back She had bowel impaction and saw GI/ Buccini So she has been off tramadol and gabapentin Started on Miralax  Back pain came back off and on since she last saw  This is all complicated by her problems with UTI She has had 3 difft antibiotics  Saw psychiatrist at [facility] who she is pleased with. Started her on Celexa 20 mg daily.    Physical exam:  Left shoulder exam: Painful arc Neg IR and ER lag test Full ROM Has fasciculations when left arm is elevated  Moderate scoliosis Concave curve to rt  Neck motion good today  Pain over sciatic notch on rt Spasm over piriformis on rt

## 2011-10-26 NOTE — Assessment & Plan Note (Signed)
Keep up overhead activity  We want to maintain full range of motion and avoid getting any frozen shoulder

## 2011-10-26 NOTE — Assessment & Plan Note (Signed)
Restart some hip rotation exercises  These helped in the past and she [facility] That helped her gait and her hip  use a tennis ball massage

## 2011-10-26 NOTE — Assessment & Plan Note (Signed)
At present the pain this is intermittent  Because of her recent impaction I am afraid to use daily medicines  We will use her tramadol and gabapentin on a when necessary basis for pain

## 2011-11-04 ENCOUNTER — Telehealth: Payer: Self-pay | Admitting: *Deleted

## 2011-11-04 DIAGNOSIS — M25569 Pain in unspecified knee: Secondary | ICD-10-CM

## 2011-11-04 NOTE — Telephone Encounter (Signed)
Message copied by Mora Bellman on Fri Nov 04, 2011  1:41 PM ------      Message from: Lizbeth Bark      Created: Fri Nov 04, 2011 10:24 AM      Regarding: PHONE MESSAGE      Contact: [phone]       Pt would like you to call her regarding getting PT for her knee pain. She asked if you could call her before lunch. Thanks!

## 2011-11-04 NOTE — Telephone Encounter (Signed)
Referred to [facility]- they do water PT.

## 2011-11-16 ENCOUNTER — Encounter: Payer: Self-pay | Admitting: Women's Health

## 2011-11-16 ENCOUNTER — Ambulatory Visit (INDEPENDENT_AMBULATORY_CARE_PROVIDER_SITE_OTHER): Payer: BC Managed Care – PPO | Admitting: Women's Health

## 2011-11-16 DIAGNOSIS — N952 Postmenopausal atrophic vaginitis: Secondary | ICD-10-CM

## 2011-11-16 LAB — WET PREP FOR TRICH, YEAST, CLUE: WBC, Wet Prep HPF POC: NONE SEEN

## 2011-11-16 NOTE — Progress Notes (Signed)
Patient ID: Michelle Sims, female   DOB: [birth year], 78 y.o.   MRN: [id number] Presents with complaint of external vaginal itching, has had some relief with Terazol. Had been treated for UTI last month, symptoms resolved, negative test of cure urine. Denies any urinary symptoms, pain, fever, nausea or other symptoms. Had been using estradiol 0.02% cream vaginally for vaginal atrophy but stopped about 6 weeks ago when urinary symptoms occurred. Self treated with Terazol for itching and had used one Diflucan since had been on an antibiotic for UTI. History of breast cancer with mastectomy /1979.   Exam: Appears well, abdomen soft nontender, external genitalia within normal limits, no noted erythema, +1 rectocele, wet prep  negative, vaginal walls atrophic.  Atrophic vaginitis  Plan: Resume estradiol 0.02% cream twice weekly, instructed to call if no relief of symptoms.

## 2011-11-23 ENCOUNTER — Telehealth: Payer: Self-pay | Admitting: *Deleted

## 2011-11-23 NOTE — Telephone Encounter (Signed)
Pt informed with the below note. 

## 2011-11-23 NOTE — Telephone Encounter (Signed)
Pt called c/o vaginal burning today, pt said it started about 1 hour ago. Slight vaginal burning with urination, no back pain, no discharge Pt asked if you would like to see her or give Rx? Please advise

## 2011-11-23 NOTE — Telephone Encounter (Signed)
Patient saw  Maryelizabeth Rowan last week. She asked her to resume her estradiol cream. Has she done it? How many times has she used it?

## 2011-11-23 NOTE — Telephone Encounter (Signed)
Yes patient is using estradiol cream 3 times weekly.

## 2011-11-23 NOTE — Telephone Encounter (Signed)
Pt stopped the estradiol cream about 6 weeks ago when urinary symptoms occurred. She used the cream last Wednesday(11/16/11), Friday(11/18/11), and Monday (11/21/11).

## 2011-11-23 NOTE — Telephone Encounter (Signed)
My guess is that she as she uses it  More The burning will go away. I would use it nightly for 2 weeks and then 3 times a week. If she continues to have burning with urination she should come in for urinalysis.

## 2011-11-23 NOTE — Telephone Encounter (Signed)
Nancy's note asked her to resume the cream. How long had she stopped it?, How many times has she applied it since she restarted?

## 2011-11-25 ENCOUNTER — Other Ambulatory Visit: Payer: Self-pay | Admitting: Obstetrics and Gynecology

## 2011-11-28 ENCOUNTER — Encounter: Payer: Self-pay | Admitting: *Deleted

## 2011-11-28 NOTE — Progress Notes (Signed)
Patient ID: Michelle Sims, female   DOB: December 09, 1929, 78 y.o.   MRN: [id number] Pt called to follow up last telephone encounter regarding vaginal burning. relief from estradiol did help using nightly, pt said that she will be leaving out of town on thursday and asked if vaginal burning should worsen should she take the Levaquin that she has. I left message on her voicemail per her request, to continue what Dr.G told her continue taking estradiol nightly and not to take the Levaquin because it may worsen her vaginal burning. I told pt to call if further questions.

## 2011-11-30 ENCOUNTER — Encounter: Payer: Self-pay | Admitting: Sports Medicine

## 2011-11-30 ENCOUNTER — Ambulatory Visit (INDEPENDENT_AMBULATORY_CARE_PROVIDER_SITE_OTHER): Payer: BC Managed Care – PPO | Admitting: Sports Medicine

## 2011-11-30 VITALS — BP 123/81 | HR 80

## 2011-11-30 DIAGNOSIS — M25559 Pain in unspecified hip: Secondary | ICD-10-CM

## 2011-11-30 DIAGNOSIS — IMO0002 Reserved for concepts with insufficient information to code with codable children: Secondary | ICD-10-CM

## 2011-11-30 DIAGNOSIS — M5020 Other cervical disc displacement, unspecified cervical region: Secondary | ICD-10-CM

## 2011-11-30 DIAGNOSIS — M25551 Pain in right hip: Secondary | ICD-10-CM

## 2011-11-30 DIAGNOSIS — M1710 Unilateral primary osteoarthritis, unspecified knee: Secondary | ICD-10-CM

## 2011-11-30 NOTE — Progress Notes (Signed)
Patient ID: Michelle Sims, female   DOB: 18-Feb-1929, 78 y.o.   MRN: [id number]  SUBJECTIVE:  Michelle Sims is a 78 y.o. female with multiple medical problems who presents for f/u of neck, upper back, shoulder and hip/gluteus pain.  She reports primary problem today is upper back pain, specifically located along vertebral border of LEFT scapula.  Pt has known cervical spine DJD and stenosis at levels C3, C4, C5.  Reports pain primarily starts in mid-afternoon and that she is typically pain-free during the day.  Once pain starts the only thing that alleviates her pain is resting in a position that takes the pressure off her back (sitting, laying down).  Since last appointment she has titrated herself off gabapentin and tramadol completely where previously she was relatively pain-free.  Main reason for titrating off was fear over repeat fecal impaction 2/2 constipation and desire to take less medications.  Now using Miralax and fiber colace regularly to ensure bowel movements at advice of other physician.  She reports not performing her hip exercises as she should have been since last appointment.  She has not noticed much change with hip/glut pain but again admits to non-compliance with exercises.  She has been going to water aerobics class on Tuesday and Thursday for 1 hour each class which have been helpful.  Knee pain secondary to chronic DJD of medial > lateral compartment bilaterally is relatively unchanged.    Also of note, recent TSH elevated to 20s as evidenced by lab work provided by husband, her levothyroxine has been increased and will be titrated prn by her primary care physician.  OBJECTIVE: Vital signs as noted above.  Elderly female patient in no acute distress, resting comfortably on exam table   Neck / Upper back exam:  - No gross abnormalities on inspection - Side-bending limited bilaterally to approximately 25 degrees.  Rotation, flexion and extension all WNL and pain-free -  Negative Spurling's bilaterally  - No tenderness to palpation along bilateral shoulders; pt reports pain predominantly at inferior angle of LEFT scapula but is non-tender.  No apparent rotation or side-bending deficits on osetopathic screen  Knee / Hip exam: - On inspection medial compartment of bilateral knee appears collapsed with some osteophyte formation apparent, cool to touch - AROM and PROM of bilateral knees preserved and symmetric - PROM of hips reveal 30-35 degrees bilaterally in IR but bilateral limitation in ER to approximately 35-40 degrees; this limitation remains during standing ER as well. - Pain-free during IR and ER as well - 5/5 strength bilaterally in hip and knee flexors, extensors; both pain-free on testing - FABER is limited secondary to poor flexibility but no obvious pain, or positives - Negative Straight leg raise bilaterally  X-Ray: Old neck films reviewed, report copied below  CERVICAL SPINE - COMPLETE 4+ VIEW  Comparison: None.  Findings: No evidence of cervical spine fracture, subluxation, or  prevertebral soft tissue swelling.  Mild to moderate degenerative disc disease is seen from levels of  C3-T1. Prominent uncovertebral spurring and mild facet DJD are  seen bilaterally resulting in neural foraminal narrowing  bilaterally at C4-5 and C5-6, and on the right at C6-7.  IMPRESSION:  1. No acute findings.  2. Moderate cervical spondylosis, with bilateral lower cervical  foraminal stenosis.  ASSESSMENT:  1. Bilateral hip pain, decreased ROM 2. Degenerative disc disease of C-spine with radiculopathy, chronic 3. Shoulder pain, Left > Right  PLAN: Pt with history and exam today to suggest chronic c-spine DDD  with radiculopathy especially to peri-scapular musculature.  As noted in patient instructions she was encouraged to wear brace at least 1 hour per day to alleviate stress on spine.  She will re-start gabapentin and tramadol BID to be dosed at 11 AM and  again 1 each at 3-4 PM to correlate with the time of day she typically has pain and block these sensations.  Hesitant to go high doses b/c of prior impaction.  Shoulders currently exhibit good ROM, encouraged continue shoulder ROM exercises to prevent frozen shoulder and promote continued independence as noted in pt instructions.  Hips have limitation to 35-45 degrees in external rotation bilaterally which may correlate with hip/glut pain.  Encouraged to complete hip exercises on days she is not in water aerobics as noted in pt instructions.  She will follow-up in 6-8 weeks to evaluate for response to therapy.    -- Kenney Houseman, MS IV

## 2011-11-30 NOTE — Assessment & Plan Note (Signed)
Unable to stop her pain unless we keep her on the medications  Still must be cautious with hx of impaction Restart gabapentin 100 bid and tramadol 50 bid Reck 4 wks

## 2011-11-30 NOTE — Patient Instructions (Addendum)
Medications (restarting tramadol and gabapentin) Try taking one tramadol and 1 gabapentin at 11 AM Then take another one of each at 4 PM  Neck exercises: Try to do some lifting and stretching of the neck --- Lift the neck up with your hands and hold for a 5 count.  Repeat 3 sets of 5 repititions Use the collar for 1 hour daily -- Allow the neck to rest using the collar, daily  Shoulder motion: Do lying stretch with getting arms up over head Also do circular motion and arm raises (flexion)  Routine until next appointment: Monday, Wednesday, and Friday - Do hip rotation exercises, especially moving the hip, see below hip rotation exercises - On days when you go to water aerobics, just focus on water aerobic exercises  Hip rotation exercises: - Standing external rotation exercises on days not in water aerobics - Standing circular movement exercises on days not in water aerobics

## 2011-11-30 NOTE — Assessment & Plan Note (Signed)
Keep up ROM exercises

## 2011-11-30 NOTE — Assessment & Plan Note (Signed)
No change in plan.

## 2011-12-06 ENCOUNTER — Ambulatory Visit: Payer: BC Managed Care – PPO | Admitting: Sports Medicine

## 2011-12-07 ENCOUNTER — Telehealth: Payer: Self-pay | Admitting: *Deleted

## 2011-12-07 ENCOUNTER — Ambulatory Visit: Payer: BC Managed Care – PPO | Admitting: Sports Medicine

## 2011-12-07 ENCOUNTER — Encounter: Payer: Self-pay | Admitting: Gynecology

## 2011-12-07 ENCOUNTER — Ambulatory Visit (INDEPENDENT_AMBULATORY_CARE_PROVIDER_SITE_OTHER): Payer: BC Managed Care – PPO | Admitting: Gynecology

## 2011-12-07 DIAGNOSIS — R300 Dysuria: Secondary | ICD-10-CM

## 2011-12-07 DIAGNOSIS — N390 Urinary tract infection, site not specified: Secondary | ICD-10-CM

## 2011-12-07 DIAGNOSIS — N952 Postmenopausal atrophic vaginitis: Secondary | ICD-10-CM

## 2011-12-07 LAB — URINALYSIS W MICROSCOPIC + REFLEX CULTURE
Casts: NONE SEEN
Crystals: NONE SEEN
Nitrite: POSITIVE — AB
Specific Gravity, Urine: 1.01 (ref 1.005–1.030)
Urobilinogen, UA: 0.2 mg/dL (ref 0.0–1.0)
pH: 7 (ref 5.0–8.0)

## 2011-12-07 MED ORDER — PHENAZOPYRIDINE HCL 200 MG PO TABS: 200.0000 mg | ORAL_TABLET | Freq: Three times a day (TID) | ORAL | Status: DC | PRN

## 2011-12-07 MED ORDER — NYSTATIN-TRIAMCINOLONE 100000-0.1 UNIT/GM-% EX OINT: TOPICAL_OINTMENT | Freq: Two times a day (BID) | CUTANEOUS | Status: DC

## 2011-12-07 NOTE — Progress Notes (Signed)
Patient presents with some dysuria and frequency. Also noted some external vulvar burning of the last 2 weeks. Her urinary symptoms have been for last several days. She is a somewhat complex history starting in September with urinary tract symptoms being treated with Macrobid. Culture grew out Citrobacter 45,000 sensitive to all the antibiotics with intermediate sensitivity to nitrofurantoin. She had some GI sensitivity was switched to amoxicillin and then Ceftin. Her symptoms seemed to have gotten better but now recurred. She also was having some vaginal irritation. Had been on estradiol vaginal cream for the last several years historically and had stopped this about 6 weeks ago and was told to restart it and she's been doing it now for the last several weeks although has not noticed a dramatic improvement in her vulvar irritation.  Exam with Blanca Asst. External with atrophic changes. Vagina foreshortened atrophic. Bimanual without masses or tenderness.  Assessment and plan: Urinalysis consistent with UTI. She does use Levaquin for other infections and does well with this antibiotic. We'll treat with Levaquin 500 milligrams daily x7 days. Pyridium 200 mg 3 times a day #10 as needed. She had been seen by Dr. Patsi Sears in the past for recalcitrant UTIs I think given her picture I want to go ahead and have him see her now and we'll set that appointment up for her. Lastly I wonder whether her vulvar irritation is not more yeast due to all the different antibiotics. Recommended she stop the estradiol for now and treat with Mytrex twice a day as needed and see if this doesn't control the symptoms.

## 2011-12-07 NOTE — Telephone Encounter (Signed)
Message copied by Aura Camps on Wed Dec 07, 2011  3:18 PM ------      Message from: Keenan Bachelor      Created: Wed Dec 07, 2011 12:30 PM                   ----- Message -----         From: Dara Lords, MD         Sent: 12/07/2011  12:02 PM           To: Keenan Bachelor            Schedule appointment with Dr. Patsi Sears urologist in reference to recurrent UTIs. She has seen him in the past for similar complaints.

## 2011-12-07 NOTE — Patient Instructions (Signed)
Take Levaquin antibiotic daily for 7 days. Use peridium 3 times daily as needed for bladder irritation. Apply Mytrex vaginal cream externally twice daily as needed for irritation. Office will contact you to arrange appointment with Dr. Patsi Sears because of your frequent urinary tract infections.

## 2011-12-08 MED ORDER — LEVOFLOXACIN 500 MG PO TABS: 500.0000 mg | ORAL_TABLET | Freq: Every day | ORAL | Status: DC

## 2011-12-08 NOTE — Telephone Encounter (Signed)
Notes and labs faxed to office.

## 2011-12-08 NOTE — Telephone Encounter (Signed)
Appointment with Dr.Tannenbaum on dec 13 th @ 2:30 pm pt informed with time and date. Pt also requested rx for Levaquin 500 milligrams daily x7 days. Will be sent per office note 12/07/11

## 2011-12-12 ENCOUNTER — Telehealth: Payer: Self-pay | Admitting: Obstetrics and Gynecology

## 2011-12-12 NOTE — Telephone Encounter (Addendum)
Patient called requested Dr. Eda Paschal to call her.  She has several concerns.   #1 She has been using meds prescribed by Dr. Velvet Bathe but is still having vaginal burning.  She said the Mytrex cream is for topical use and what is she to do for burning inside her vagina?    #2 Also, Dr. Velvet Bathe recommended she d/c Estradiol cream "for now" and see if symptoms resolve.  Patient is concerned that he had her d/c this because Dr. Reece Agar had prescribed it to help with her vaginal dryness. She questioned when she should restart this?    #3 She wanted to know what the urine culture showed.  I see that it is still "preliminary" from 12/07/11 with >100,000 e.coli.  Dr. Velvet Bathe did treat her with Levaquin 500 and Pyridium at office visit.  What to tell her regarding culture result?  Patient did ask for Dr. Reece Agar to call her but I explained how busy his schedule is and with today being Monday as well it may be hard for him to find the time for a phone call but I would send her concerns to him.

## 2011-12-12 NOTE — Telephone Encounter (Signed)
Patient did state that her burning is some better with using the Rx's Dr. Velvet Bathe prescribed but is still having burning inside vagina.

## 2011-12-13 ENCOUNTER — Other Ambulatory Visit: Payer: Self-pay | Admitting: Gynecology

## 2011-12-13 ENCOUNTER — Telehealth: Payer: Self-pay | Admitting: Gynecology

## 2011-12-13 MED ORDER — LEVOFLOXACIN 500 MG PO TABS: 500.0000 mg | ORAL_TABLET | Freq: Every day | ORAL | Status: DC

## 2011-12-13 MED ORDER — NONFORMULARY OR COMPOUNDED ITEM: Status: DC

## 2011-12-13 NOTE — Telephone Encounter (Signed)
Levaquin 500 mg #20 daily as needed.

## 2011-12-13 NOTE — Telephone Encounter (Signed)
Patient called yesterday evening late and wanted Dr. Reece Agar to call her. She had a couple of questions.  Dr. Reece Agar had Selena Batten call her back last night and let her know he did not have time to return call last night and that someone would call her today with ur culture results. He rec I check with you for follow-up recommendation. Patient's questions are:  #1 Follow-up urine culture results.  When I looked in system they were still reported as preliminary.  I checked with Joni Reining in lab and she printed off the final with sensitivies and they are on your desk.  We will scan them in.  Please advise what to tell Venesha regarding these?  #2 She was concerned about being off the Estradiol Cream as she takes this for vaginal dryness and wondered when she could restart?  #3  She said you prescribed Mytrex cream for external use but what about when she is burning intravaginally?

## 2011-12-13 NOTE — Telephone Encounter (Signed)
Tell patient that Dr. Audie Box stopped the estrogen cream because he thought she had a vaginal yeast infection and gave her medicine for that. Once that is resolved( 2 weeks) she can reinitiate the estrogen cream. The follow up on the urine culture should be through Dr. Audie Box.

## 2011-12-13 NOTE — Telephone Encounter (Signed)
Patient was advised of all below.  Patient said she needed refill on Estradiol 0.02% vag cream and I called this in to [facility] for her.  She also said that she needed a prescription for Levaquin called in to [facility] as she is out now and she always keeps it on hand.  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  I asked her who usually diagnoses and treats that and she said she does that she knows what it is and she always has Levaquin in hand and starts it.  She requests #20 as that will treat two infections.

## 2011-12-13 NOTE — Telephone Encounter (Signed)
1. Urine culture shows sensitivity to all antibiotics except sulfa. She is on Levaquin which I think should cover it. She is to see Dr. Patsi Sears in follow up of her recurrent urinary tract infections as we discussed at her visit. 2. She was supposed to restart the estradiol cream and it does take several weeks to take effect as far as vaginal irritation internally she was told this by Harriett Sine and I assume that she was restarting it. 3. The burning intravaginally should get better as she treats with the estradiol cream but again this could take several weeks for it to take effect

## 2011-12-15 ENCOUNTER — Encounter: Payer: Self-pay | Admitting: Sports Medicine

## 2011-12-15 ENCOUNTER — Ambulatory Visit (INDEPENDENT_AMBULATORY_CARE_PROVIDER_SITE_OTHER): Payer: BC Managed Care – PPO | Admitting: Sports Medicine

## 2011-12-15 VITALS — BP 130/82 | HR 75 | Ht 63.0 in | Wt 130.0 lb

## 2011-12-15 DIAGNOSIS — S43429A Sprain of unspecified rotator cuff capsule, initial encounter: Secondary | ICD-10-CM

## 2011-12-15 DIAGNOSIS — M25519 Pain in unspecified shoulder: Secondary | ICD-10-CM

## 2011-12-15 DIAGNOSIS — M75102 Unspecified rotator cuff tear or rupture of left shoulder, not specified as traumatic: Secondary | ICD-10-CM

## 2011-12-15 DIAGNOSIS — M25512 Pain in left shoulder: Secondary | ICD-10-CM | POA: Insufficient documentation

## 2011-12-15 DIAGNOSIS — M5020 Other cervical disc displacement, unspecified cervical region: Secondary | ICD-10-CM

## 2011-12-15 NOTE — Assessment & Plan Note (Signed)
See scan below and primary cause of pain seems supraspinatus although changes in bicipital and subscap tendons as well

## 2011-12-15 NOTE — Patient Instructions (Addendum)
We would encourage you to get a walking cane (1 prong or 3 prong is ok) due to your balance issues.   For your biceps I want you to use a 1/2 pound weight for bicep curls and internal and external rotation. 3 sets of 10 repetitions for all exercises.  For your shoulders, make sure to stretch the shoulder daily at least twice a day so you do not lose the motion. Also, do wall crawls to the side and in front.  Follow up in 2 months.   Nitroglycerin Protocol for left shoulder   Apply 1/4 nitroglycerin patch to affected area daily.  Change position of patch within the affected area every 24 hours.  You may experience a headache during the first 1-2 weeks of using the patch, these should subside.  If you experience headaches after beginning nitroglycerin patch treatment, you may take your preferred over the counter pain reliever.  Another side effect of the nitroglycerin patch is skin irritation or rash related to patch adhesive.  Please notify our office if you develop more severe headaches or rash, and stop the patch.  Tendon healing with nitroglycerin patch may require 12 to 24 weeks depending on the extent of injury.  Men should not use if taking Viagra, Cialis, or Levitra.   Do not use if you have migraines or rosacea.

## 2011-12-15 NOTE — Assessment & Plan Note (Addendum)
Partial tear in both supraspinatus/subscapularis with inflammation noted in bursa. Biceps tendon with partial tear and associated inflammation but fortunately patient minimally symptomatic.  Discussed steroid injection but patient states poor history of improvement with other steroid injections. Will start patient on nitroglycerin protocol and home exercise program per AVS. Shoulder exercises essential to prevent frozen shoulder. Will have patient follow up within 2 months or sooner as needed. To continue tramadol and gabapentin for neck/back pain but likely will help with this pain as well.

## 2011-12-15 NOTE — Assessment & Plan Note (Signed)
Now on tramadol and gabapentin bid  I would not wean beyond this and can increase if worse

## 2011-12-15 NOTE — Progress Notes (Signed)
Patient ID: Michelle Sims, female   DOB: 1929-10-02, 78 y.o.   MRN: [id number]   Subjective:  Michelle Sims is a 78 y.o. female with multiple medical problems who presents for f/u of neck, upper back,  And shoulder with primary concern of shoulder pain.    1. Left shoulder pain-patient notes 8/10 stabbing pain in anterior shoulder with arm movements such as lifting over head mainly. When she returns to rest, pain resolves. Pain also with putting seat belt on, putting a jacket on, or trying to put bra on. She has taken her regular medicines for her back and neck pain with little relief (gabapentin and tramadol). No radiation of pain.   2. Upper back and neck pain-on tramadol and gabapentin, patient has experienced several days without any pain. Pain primarily along left scapula vertebral border.  Pain has a tendency to flare when she comes off of these per recors. Despite this, patient states she thinks it is not really helping. As noted previously, cervical spine DJD and stenosis at C3-C5. Laying down continues to alleviate pain primarily but sitting also helps but patient states not always in a place when it flares that she is able to do one of these two things.   ROS--See HPI  Past Medical History-depression, anxiety, HTN, hypothyroidism after thyroidectomy.  Reviewed problem list.  Medications- reviewed and updated Chief complaint-noted  Objective: BP 130/82  Pulse 75  Ht 5\' 3"  (1.6 m)  Wt 130 lb (58.968 kg)  BMI 23.03 kg/m2 Gen: thin elderly female in NAD MSK:  Shoulder: Inspection reveals no abnormalities, atrophy or asymmetry. Palpation is normal with no tenderness over AC joint or bicipital groove. ROM is limited to approximately 120 degrees in both forward flexion and abduction but otherwise within normal limits.  Rotator cuff strength is limited secondary to pain approximately 4/5 with internal rotation but otherwise normal.  Mild pain noted on Neer and Hawkin's tests,  empty can but normal strength as mentioned above.  Speeds and Yergason's tests normal. No painful arc and no drop arm sign.  Left shoulder/rotator Cuff Ultrasound: Biceps tendon was seen in bicipital groove. Significant edema noted surrounding tendon in both longitudinal and tranverse view. Tnedon was noted to be thickened at 0.64cm with hypoechoic areas noted within indicative of split tear.  Subscapularis tendon. noted to have small hypoechoic area likely representative of partial thickness tear on transverse and longitudinal views. Increased neovascularity noted on doppler.  This does gap somewhat with IR Supraspinatus tendon. Small hypoechoic area on articular surface associated with degenerative changes concerning for partial tear of supraspinatus tendon. Edema noted around bursa  indicative of bursal inflammation.  Infraspinatus and AC joint were viewed and were within normal limits.   Assessment/Plan: See problem oriented charted for shoulder pain assessment  For degenerative disc disease of C-spine with radiculopathy-encouraged patient to continue tramadol and gabapentin given flares in pain when off of medicine. Rest when possible after pain flares given improvement with laying or sitting.

## 2011-12-28 ENCOUNTER — Ambulatory Visit: Payer: BC Managed Care – PPO | Admitting: Sports Medicine

## 2012-01-16 ENCOUNTER — Encounter: Payer: Self-pay | Admitting: Sports Medicine

## 2012-01-16 ENCOUNTER — Ambulatory Visit (INDEPENDENT_AMBULATORY_CARE_PROVIDER_SITE_OTHER): Payer: BC Managed Care – PPO | Admitting: Sports Medicine

## 2012-01-16 VITALS — BP 122/83 | HR 76 | Ht 64.0 in | Wt 128.0 lb

## 2012-01-16 DIAGNOSIS — IMO0002 Reserved for concepts with insufficient information to code with codable children: Secondary | ICD-10-CM

## 2012-01-16 DIAGNOSIS — S43429A Sprain of unspecified rotator cuff capsule, initial encounter: Secondary | ICD-10-CM

## 2012-01-16 DIAGNOSIS — M1710 Unilateral primary osteoarthritis, unspecified knee: Secondary | ICD-10-CM

## 2012-01-16 DIAGNOSIS — M75102 Unspecified rotator cuff tear or rupture of left shoulder, not specified as traumatic: Secondary | ICD-10-CM

## 2012-01-16 DIAGNOSIS — M5020 Other cervical disc displacement, unspecified cervical region: Secondary | ICD-10-CM

## 2012-01-16 NOTE — Assessment & Plan Note (Signed)
Cont on water exercises for this

## 2012-01-16 NOTE — Progress Notes (Signed)
Patient ID: Michelle Sims, female   DOB: 1929-09-28, 78 y.o.   MRN: [id number]  Patient returns for several probs  In [facility] study:to see if she has parkinsons Starting 1 tab per week and building weekly to max of 2 tid ropinirole 0.25mg   Left shoulder Better motion Much less sharp pains Using NTG  Upper Back Less problems now Using gabapentin and tramadol bid Often goes several days without pain  RT Knee Known grade 4 DJD This limits her exercise ability except water exercise  Some RT piriformis pain that is chronic  Examination  NAD  Left shoulder shows 30 deg improvement in flexion and in abduction / elevation  No upper back pain with motion of arms  RT hip shows good ROM Pain is in deep buttocks mm  RT knee with medial compartment spurring and collapse Significant DJD compared to left

## 2012-01-16 NOTE — Assessment & Plan Note (Signed)
Doing well with tramadol and gabapentin  Cont these

## 2012-01-16 NOTE — Assessment & Plan Note (Signed)
She is making progress with NTG  Continue for at least 6 more weeks  Reck at that time  Repeat scan at some point

## 2012-01-16 NOTE — Patient Instructions (Addendum)
RT Knee issues You have grade 4 arthritis of RT medial knee.  This means cartilage on that side is worn out.  No real benefit from arthroscopic surgery and I don't think you are ready for knee replacement yet. Keep up some isometric strength exercises and some straight leg lifts to help keep your quadriceps strong enough.  Upper Back Pain Do not stop gabapentin or tramadol as that has really helped control the nerve irritation.   Left Rotator cuff Motion is 50% better today. Keep up NTG patches for 6 weeks more. Primary exercises are to keep the motion of the left shoulder good - wall climbs, circular motion.  RT buttocks I think water aerobics and motion exercises will help this.  I think the Physical therapy will help but you need to get a good home program to continue after completing therapy.  Recheck with me in 2 months

## 2012-01-26 ENCOUNTER — Ambulatory Visit: Payer: BC Managed Care – PPO | Admitting: Sports Medicine

## 2012-02-22 ENCOUNTER — Other Ambulatory Visit: Payer: Self-pay | Admitting: *Deleted

## 2012-02-22 MED ORDER — GABAPENTIN 100 MG PO CAPS: 100.0000 mg | ORAL_CAPSULE | Freq: Three times a day (TID) | ORAL | Status: DC

## 2012-03-20 ENCOUNTER — Ambulatory Visit
Admission: RE | Admit: 2012-03-20 | Discharge: 2012-03-20 | Disposition: A | Discharge status: Home / Self Care | Payer: BC Managed Care – PPO | Source: Ambulatory Visit | Attending: Sports Medicine | Admitting: Sports Medicine

## 2012-03-20 ENCOUNTER — Ambulatory Visit (INDEPENDENT_AMBULATORY_CARE_PROVIDER_SITE_OTHER): Payer: BC Managed Care – PPO | Admitting: Sports Medicine

## 2012-03-20 VITALS — BP 110/70 | Ht 65.0 in | Wt 124.0 lb

## 2012-03-20 DIAGNOSIS — M5020 Other cervical disc displacement, unspecified cervical region: Secondary | ICD-10-CM

## 2012-03-20 DIAGNOSIS — Z85850 Personal history of malignant neoplasm of thyroid: Secondary | ICD-10-CM

## 2012-03-20 DIAGNOSIS — M25512 Pain in left shoulder: Secondary | ICD-10-CM

## 2012-03-20 DIAGNOSIS — M25519 Pain in unspecified shoulder: Secondary | ICD-10-CM

## 2012-03-20 MED ORDER — NITROGLYCERIN 0.1 MG/HR TD PT24: MEDICATED_PATCH | TRANSDERMAL | Status: DC

## 2012-03-20 NOTE — Assessment & Plan Note (Signed)
We'll plan to get x-rays of her left shoulder today I do not think that the shoulder is clinically worse but I would like to assess as to whether she has any arthritis  She needs to resume the tramadol and the gabapentin at least twice daily She needs to work on motion for her left shoulder  After the plain x-rays are done we will have her return to clinic and we'll repeat her ultrasound scan which showed rotator cuff tear before she should continue on nitroglycerin patches until we complete that.

## 2012-03-20 NOTE — Progress Notes (Signed)
Patient ID: Michelle Sims, female   DOB: [birth year], 79 y.o.   MRN: [id number]  Patient enters with bilateral shoulder pain but much worse on the left In October we found a partial thickness rotator cuff tear on the left She developed some limitation in motion suggestive of an early frozen shoulder By her last visit the motion had improved somewhat Now her pain is primarily with putting on a coat or reaching back  Some pain on the right shoulder primarily when she does too much activity  Important in her symptoms is that she currently is moving to [place] She was getting much better relief of her upper back and shoulder pain using 1 tramadol 100 mg a gabapentin twice a day She admits to a lot of stress during the move and she sometimes takes only one medication a day and sometimes forgets it entirely  She does seem to remember to continue doing her shoulder motion exercises  She is also in a study at [facility] for Parkinson's disease and recently started on Sinemet  She occasionally gets tightness in the neck and some spasm in the trapezius. Her x-rays in 2012 showed significant degenerative disease of her cervical spine  Physical examination  She appears a bit depressed but in no acute distress  Range of motion of the left shoulder is limited in external rotation She can elevate and abduct the shoulder up to about 120 Internal rotation is normal Internal rotation and external rotation strength at waist level are good Abduction and supraspinatous testing were normal She has mild pain with empty can and Hawkins test but good strength  Right shoulder testing showed that her motion was at baseline All strength testing were unremarkable  Today I note some mild cogwheeling in both her right and left biceps not in either lower extremity She does not have much resting tremor

## 2012-03-20 NOTE — Assessment & Plan Note (Signed)
This is in better control today  I do not want her to stop using the gabapentin though as I think she needs to maintain about twice a day dose

## 2012-03-20 NOTE — Assessment & Plan Note (Signed)
I reviewed her thyroid functions which are slightly elevated  She knows that she needs follow up with [name] to repeat the level next month

## 2012-03-28 ENCOUNTER — Encounter: Payer: Self-pay | Admitting: Family Medicine

## 2012-03-28 ENCOUNTER — Ambulatory Visit (INDEPENDENT_AMBULATORY_CARE_PROVIDER_SITE_OTHER): Payer: BC Managed Care – PPO | Admitting: Family Medicine

## 2012-03-28 VITALS — BP 144/82 | HR 81 | Ht 63.5 in | Wt 124.0 lb

## 2012-03-28 DIAGNOSIS — M75102 Unspecified rotator cuff tear or rupture of left shoulder, not specified as traumatic: Secondary | ICD-10-CM

## 2012-03-28 DIAGNOSIS — S43429A Sprain of unspecified rotator cuff capsule, initial encounter: Secondary | ICD-10-CM

## 2012-03-28 NOTE — Patient Instructions (Addendum)
Thank you for coming in today. You have a small tear of the subscapularis tendon. This will heal with exercises and nitroglycerin patches.  You also have fluid in the biceps tendon sheath. This is coming from the subscapularis tendon tear.  Continue the exercises Dr. Darrick Penna discussed.  Follow up with Dr. Darrick Penna as regularly scheduled in 6 weeks or so.  Take care and enjoy the spring.

## 2012-03-28 NOTE — Assessment & Plan Note (Signed)
Subscapularis with biceps tendon involvement. Previously healed supraspinatus tendon.  Plan: Home exercise program and nitroglycerin patch protocol.  Followup with Dr. Darrick Penna in 6 weeks

## 2012-03-28 NOTE — Progress Notes (Signed)
Michelle Sims is a 79 y.o. female who presents to [facility] today for followup left shoulder pain. Patient recently saw Dr. Darrick Penna for neck and shoulder pain.   She previously was seen in October where she was found to have small subscapularis tear which was treated with range of motion exercises. In February up in followup range of motion exercises were continued and nitroglycerin patches were added. She was asked to followup today for musculoskeletal ultrasound.  She notes interval improvement in her shoulder pain and range of motion with nitroglycerin patches and exercises. She feels well otherwise.   PMH reviewed. History of thyroid cancer History  Substance Use Topics  . Smoking status: Never Smoker   . Smokeless tobacco: Never Used  . Alcohol Use: Yes     Comment: rare   ROS as above otherwise neg   Exam:  BP 144/82  Pulse 81  Ht 5' 3.5" (1.613 m)  Wt 124 lb (56.246 kg)  BMI 21.62 kg/m2 Gen: Well NAD MSK: Left shoulder.  Normal appearance Range of motion abduction to 160 and external rotation to 85 internal rotation to the lumbar spine Negative Hawkins and Neer's tests Negative Yergason's and speeds test.  Negative crossover arm compression test.  Strength 4+/5 to supraspinatus external and internal rotation.   Pulses capillary refill and sensation are intact distally  Dg Shoulder Left  03/20/2012  *RADIOLOGY REPORT*  Clinical Data: Left shoulder pain, decreased range of motion, no known injury  LEFT SHOULDER - 2+ VIEW  Comparison: None.  Findings: Mild degenerative joint disease of the glenohumeral joint is noted  consistent with age.  The left [facility] joint is normally aligned.  No acute abnormality is seen.  IMPRESSION: Only mild degenerative joint disease for age.   Original Report Authenticated By: Dwyane Dee, M.D.    Limited musculoskeletal ultrasound of the left shoulder:  Biceps tendon: Large amount of fluid seen within the tendon sheath. Small longitudinal split seen in the  proximal portion long head of the biceps tendon.  Subscapularis: Small articular surface partial-thickness partial with care with calcification seen.  Supraspinatus: Normal appearing on long and short views Infraspinatus: Normal appearing on long and short views A.c. Joint: Normal-appearing

## 2012-04-18 ENCOUNTER — Other Ambulatory Visit: Payer: Self-pay | Admitting: Gynecology

## 2012-04-18 ENCOUNTER — Telehealth: Payer: Self-pay | Admitting: *Deleted

## 2012-04-18 NOTE — Telephone Encounter (Signed)
(  Dr. Reece Agar patient) Pt called today c/o lymphedema has returned in her arm, she takes Levaquin 500 mg tablet. She has took one pill of the 500 mg tablets already. You gave her refilled her rx on 12/13/11. Pt said she feels bad, she told me that her internist originally wrote Rx, I asked if she felt bad to make OV with internist she declined stating "the doctor can't do anything the Levaquin will help". She is requesting a refill and if you think it will be okay to take 2 pills of the 50 mg tablets? Please advise

## 2012-04-18 NOTE — Telephone Encounter (Signed)
I informed with the below note. She will follow up with internist.

## 2012-04-18 NOTE — Telephone Encounter (Signed)
Patient needs to followup with her internist or whoever is following her for lymphedema. Has a gynecologist I do not feel comfortable following this and so we do not quite understand how Levaquin is going help with this. We had used this in the past for a UTI but not for lymphedema

## 2012-05-14 ENCOUNTER — Telehealth: Payer: Self-pay | Admitting: *Deleted

## 2012-05-14 ENCOUNTER — Other Ambulatory Visit: Payer: Self-pay

## 2012-05-14 DIAGNOSIS — Z853 Personal history of malignant neoplasm of breast: Secondary | ICD-10-CM

## 2012-05-14 DIAGNOSIS — Z9882 Breast implant status: Secondary | ICD-10-CM

## 2012-05-14 DIAGNOSIS — Z1231 Encounter for screening mammogram for malignant neoplasm of breast: Secondary | ICD-10-CM

## 2012-05-14 MED ORDER — RALOXIFENE HCL 60 MG PO TABS: 60.0000 mg | ORAL_TABLET | Freq: Every day | ORAL | Status: DC

## 2012-05-14 NOTE — Telephone Encounter (Signed)
Pt called requesting 90 day supply for Evista 60 mg tablets 90 day supply. I explained to pt that she is very overdue for annual due in Feb 2014. Pt said she is in process of moving and will have scheduled in may. I told her it is very important to schedule exam if she wants to continue receiveing refills. rx sent, pt will call back to schedule.

## 2012-06-11 ENCOUNTER — Ambulatory Visit
Admission: RE | Admit: 2012-06-11 | Discharge: 2012-06-11 | Disposition: A | Discharge status: Home / Self Care | Payer: BC Managed Care – PPO | Source: Ambulatory Visit

## 2012-06-11 DIAGNOSIS — Z9882 Breast implant status: Secondary | ICD-10-CM

## 2012-06-11 DIAGNOSIS — Z1231 Encounter for screening mammogram for malignant neoplasm of breast: Secondary | ICD-10-CM

## 2012-06-11 DIAGNOSIS — Z853 Personal history of malignant neoplasm of breast: Secondary | ICD-10-CM

## 2012-06-15 ENCOUNTER — Other Ambulatory Visit: Payer: Self-pay | Admitting: Internal Medicine

## 2012-06-15 DIAGNOSIS — C73 Malignant neoplasm of thyroid gland: Secondary | ICD-10-CM

## 2012-06-21 ENCOUNTER — Ambulatory Visit
Admission: RE | Admit: 2012-06-21 | Discharge: 2012-06-21 | Disposition: A | Discharge status: Home / Self Care | Payer: BC Managed Care – PPO | Source: Ambulatory Visit | Attending: Internal Medicine | Admitting: Internal Medicine

## 2012-06-21 DIAGNOSIS — C73 Malignant neoplasm of thyroid gland: Secondary | ICD-10-CM

## 2012-08-14 ENCOUNTER — Encounter: Payer: Self-pay | Admitting: Sports Medicine

## 2012-08-14 ENCOUNTER — Ambulatory Visit (INDEPENDENT_AMBULATORY_CARE_PROVIDER_SITE_OTHER): Payer: BC Managed Care – PPO | Admitting: Sports Medicine

## 2012-08-14 VITALS — BP 125/76 | HR 72 | Ht 63.5 in | Wt 124.0 lb

## 2012-08-14 DIAGNOSIS — M1710 Unilateral primary osteoarthritis, unspecified knee: Secondary | ICD-10-CM

## 2012-08-14 DIAGNOSIS — M5020 Other cervical disc displacement, unspecified cervical region: Secondary | ICD-10-CM

## 2012-08-14 DIAGNOSIS — IMO0002 Reserved for concepts with insufficient information to code with codable children: Secondary | ICD-10-CM

## 2012-08-14 NOTE — Assessment & Plan Note (Signed)
This is much more stable than several yrs ago  Reminded to work on posture and use collar when she gets flares

## 2012-08-14 NOTE — Assessment & Plan Note (Signed)
I discouraged her thoughts about Synvisc - no longer recommended ay AAOS  I don't think she is severe enough or a great candidate for TKR  See plan and pt education  I did think ongoing PT is good as her gait is more stable

## 2012-08-14 NOTE — Progress Notes (Signed)
Patient ID: Michelle Sims, female   DOB: 01/05/1930, 79 y.o.   MRN: [id number]  Patient with long hx of Knee pain on RT with severe DJD  She is in a study at [facility] of Parkinson's Takes Sinemet DAT scan shows mild changes Basically hand writing has stabilized and still gets bad after writing much No significant tremor  RT knee has not responded to last couple of injections Pain level is 0 to 1 at rest Tends only to occur with weight bearing No real swelling  PE  NAD  Mild cogwheel only No significant tremor Hand writing good today  RT knee Large medial osteophytes No effusion Flex and ext unchanged  Gait - thi is improved after her sessions of PT/  Better turn around Not unstable/ poor RT arm swing

## 2012-08-14 NOTE — Patient Instructions (Addendum)
Continue PT when you are able  Continue to work on balance and gait  I don't know that you would benefit as much from Knee replacement as you would like - partly because of age - but also the amount of time for recovery  American Academy of Orthopedic surgery now recommends against Euflexxa, Synvisc and similar injections  Use compression sleeve for standing longer periods of time or walking a long distance Not all the time  I don't think the Parkinson's is made worse by your knee

## 2012-08-16 ENCOUNTER — Other Ambulatory Visit: Payer: Self-pay | Admitting: Gynecology

## 2012-08-16 ENCOUNTER — Telehealth: Payer: Self-pay | Admitting: *Deleted

## 2012-08-16 MED ORDER — RALOXIFENE HCL 60 MG PO TABS: 60.0000 mg | ORAL_TABLET | Freq: Every day | ORAL | Status: DC

## 2012-08-16 NOTE — Telephone Encounter (Signed)
Pt rx for Evista was denied because she is overdue for annual. AEX scheduled on 09/21/12. Evista rx will be sent #30 supply only.

## 2012-09-07 ENCOUNTER — Ambulatory Visit (INDEPENDENT_AMBULATORY_CARE_PROVIDER_SITE_OTHER): Payer: BC Managed Care – PPO | Admitting: Gynecology

## 2012-09-07 ENCOUNTER — Encounter: Payer: Self-pay | Admitting: *Deleted

## 2012-09-07 ENCOUNTER — Encounter: Payer: Self-pay | Admitting: Gynecology

## 2012-09-07 VITALS — BP 120/78 | Ht 63.0 in | Wt 128.0 lb

## 2012-09-07 DIAGNOSIS — M8580 Other specified disorders of bone density and structure, unspecified site: Secondary | ICD-10-CM

## 2012-09-07 DIAGNOSIS — M949 Disorder of cartilage, unspecified: Secondary | ICD-10-CM

## 2012-09-07 DIAGNOSIS — N816 Rectocele: Secondary | ICD-10-CM

## 2012-09-07 DIAGNOSIS — N952 Postmenopausal atrophic vaginitis: Secondary | ICD-10-CM

## 2012-09-07 DIAGNOSIS — M899 Disorder of bone, unspecified: Secondary | ICD-10-CM

## 2012-09-07 NOTE — Patient Instructions (Signed)
followup in one year, sooner as needed. 

## 2012-09-07 NOTE — Progress Notes (Signed)
Michelle Sims 1929-07-12 [id number]        79 y.o.  [id number] for followup exam.  Former patient of Dr. Eda Paschal with several issues noted below.  Past medical history,surgical history, medications, allergies, family history and social history were all reviewed and documented in the EPIC chart.  ROS:  Performed and pertinent positives and negatives are included in the history, assessment and plan .  Exam: Kim assistant Filed Vitals:   09/07/12 1432  BP: 120/78  Height: 5\' 3"  (1.6 m)  Weight: 128 lb (58.06 kg)   General appearance  Normal Skin grossly normal Head/Neck normal with no cervical or supraclavicular adenopathy thyroid normal Lungs  clear Cardiac RR, without RMG Abdominal  soft, nontender, without masses, organomegaly or hernia Breasts  examined lying and sitting. Status post right mastectomy with bilateral reconstruction. No masses, retractions, discharge or adenopathy.  Pelvic  Ext/BUS/vagina  significant atrophy with shortened vagina. First to second degree rectocele   Cervix  flush with vagina not clearly visualized  Uterus  small, not clearly palpated. No masses or tenderness on bimanual  Anus and perineum  normal   Rectovaginal  normal sphincter tone without palpated masses or tenderness.    Assessment/Plan:  79 y.o. [id number] female for followup exam.   1. Significant vaginal atrophy. Had been on Vagifem for number of years and then switched over the last several years to estradiol. Asked for refill. Using 2-3 times weekly. I reviewed the issues of estrogen use in breast cancer survivors. She is status post mastectomy in 1979. Issues of quiescent metastatic disease being stimulated by the estrogen possibly increasing recurrence risk discussed. Issues of absorption from vaginal route receiving blood levels also discussed. After lengthy discussion patient feels she wants to continue on the estrogen clearly understanding and accepting the risks and I refilled her  estradiol 0.02% cream x1 year. 2. Osteopenia. DEXA 03/2011 with T score -1.3. Currently on Evista and has been for a number of years. Increase calcium vitamin D reviewed. 3. Pap smear 2013. No Pap smear done today. No history of significant abnormal Pap smears. Review of current screening guidelines options to stop screening altogether given her age and history versus less frequent screening intervals reviewed and we'll readdress on an annual basis. 4. Mammography 05/2012. Continue with annual mammography. SBE monthly reviewed. 5. Colonoscopy 2010. Repeat at their recommended interval. 6. Health maintenance. No blood work done as this is all done through her primary physician's office. Followup one year, sooner as needed.  Note: This document was prepared with digital dictation and possible smart phrase technology. Any transcriptional errors that result from this process are unintentional.   Dara Lords MD, 3:40 PM 09/07/2012

## 2012-09-07 NOTE — Progress Notes (Signed)
Patient ID: Michelle Sims, female   DOB: 25-Oct-1929, 79 y.o.   MRN: [id number] [facility] 0.[facility]% cream  Insert 1 ml in vagina 3 times each week. With refill until next august. Per TF request.

## 2012-09-08 LAB — URINALYSIS W MICROSCOPIC + REFLEX CULTURE
Bilirubin Urine: NEGATIVE
Crystals: NONE SEEN
Leukocytes, UA: NEGATIVE
Nitrite: NEGATIVE
Protein, ur: NEGATIVE mg/dL
Specific Gravity, Urine: 1.015 (ref 1.005–1.030)
Squamous Epithelial / LPF: NONE SEEN
Urobilinogen, UA: 0.2 mg/dL (ref 0.0–1.0)

## 2012-09-12 ENCOUNTER — Telehealth: Payer: Self-pay | Admitting: *Deleted

## 2012-09-12 MED ORDER — RALOXIFENE HCL 60 MG PO TABS: 60.0000 mg | ORAL_TABLET | Freq: Every day | ORAL | Status: DC

## 2012-09-12 NOTE — Telephone Encounter (Signed)
Pt called and forgot to ask for refill on Evista 60 mg at OV per note on 09/07/12 rx will be sent with refills.

## 2012-09-20 ENCOUNTER — Telehealth: Payer: Self-pay | Admitting: *Deleted

## 2012-09-20 NOTE — Telephone Encounter (Signed)
Message copied by Jacki Cones C on Thu Sep 20, 2012 12:30 PM ------      Message from: CERESI, MELANIE L      Created: Wed Sep 19, 2012  3:58 PM      Regarding: phone message      Contact: [phone]       Beena would like you to call her regarding her back pain.  Can Dr. Darrick Penna do anything else for her? ------

## 2012-09-20 NOTE — Telephone Encounter (Signed)
Back pain getting bad again.  Does not want to go back on gabapentin and tramadol, prefers PT.

## 2012-09-21 ENCOUNTER — Encounter: Payer: Self-pay | Admitting: Gynecology

## 2012-09-21 NOTE — Telephone Encounter (Signed)
Per Dr. Darrick Penna ok to try a couple of PT sessions to see if this helps back pain, but should let us know if it does not help because she may need to start back on gabapentin and tramadol.  Left pt a VM to call me back.

## 2012-09-21 NOTE — Telephone Encounter (Signed)
Pt would like to discuss with dr Darrick Penna- scheduled her for an appt 09/27/12.

## 2012-09-27 ENCOUNTER — Encounter: Payer: Self-pay | Admitting: Sports Medicine

## 2012-09-27 ENCOUNTER — Ambulatory Visit (INDEPENDENT_AMBULATORY_CARE_PROVIDER_SITE_OTHER): Payer: BC Managed Care – PPO | Admitting: Sports Medicine

## 2012-09-27 VITALS — BP 147/84 | HR 66 | Ht 63.0 in | Wt 128.0 lb

## 2012-09-27 DIAGNOSIS — M5020 Other cervical disc displacement, unspecified cervical region: Secondary | ICD-10-CM

## 2012-09-27 NOTE — Patient Instructions (Signed)
Dear Mrs. Pacholski,   It was great to see you today. Please do the following things that we discussed:   1. Stop taking the melatonin at night 2. Take Gabapentin 100 mg each night 3. We will make specific recommendations for physical therapy:  - Seated rowing motion with elbows at side until shoulders return to normal posture - 5 times for count of 3 sec  - Seated external fly at 70 degrees of shoulder abduction until shoulder blades almost touch - 5 times for count of 3 sec  - Stand against the wall with head and shoulder back flat against the wall - 3 times for 10 sec count   - Place a towel/cylindrical pillow between shoulder blades and lie flat on your back for 30 sec  Please follow up in 6 weeks.    Sincerely,   Dr. Darrick Penna and Dr. Clinton Sawyer

## 2012-09-27 NOTE — Assessment & Plan Note (Signed)
Assessment: chronic stable Paraspinous muscle spasm due to cervical radiculopathy Plan: restart gabapentin at 100 mg QHS. Cont PT and instructions given in AVS, f/u in 6 weeks

## 2012-09-27 NOTE — Progress Notes (Signed)
  Subjective:    Patient ID: Michelle Sims, female    DOB: 27-Nov-1929, 79 y.o.   MRN: [id number]  HPI  79 year old F with hx of right knee pain due to severe DJD and cervical spine DJD, last seen on 08/14/12 by Dr. Darrick Penna. Patient presents today for evaluation of back pain. This back pain is consistent with previous back and shoulder pain. Specifically, the pain is located in the left posterior shoulder medial to the shoulder blade, non -radiating, currently rates it a 0/10 but was an 8/10 last week when appointment made made, alleviated by sitting in a comfortable chair and lying down, exacerbated by lifting above her head and pain worsened in the evenings. Patient previously prescribed tramadol and gabapentin, which she is not taking.     Review of Systems Positive for anxiety and depression Negative for tremors or difficulty writing        Objective:   Physical Exam BP 147/84  Pulse 66  Ht 5\' 3"  (1.6 m)  Wt 128 lb (58.06 kg)  BMI 22.68 kg/m2 Gen: non distressed, dynamic affect Back: mild scoliosis, no lordosis of thoracic spine, left scapular protraction, no tenderness to palpation of left paraspinal muscles Neuro: no cogwheel rigidity  Gait is improved with more normal steps and turn     Assessment & Plan:

## 2012-10-29 ENCOUNTER — Telehealth: Payer: Self-pay | Admitting: *Deleted

## 2012-10-29 NOTE — Telephone Encounter (Signed)
PT IS CALLING REQUESTING A RX FOR ZOFRAN, NOW SEEING PSYCHIATRIST AND HAS PRESCRIBED "BRINTELLIX 10 MG DAILY" AND SIDE EFFECT IS NAUSEA. THIS PERSON CANNOT PRESCRIBE ZOFRAN AND SUGGEST THEY CALL SOMEONE TO, SO THEY CALLED HERE. PLEASE ADVISE

## 2012-10-29 NOTE — Telephone Encounter (Signed)
Not quite sure why they can prescribe Brintellix but not something for nausea. Regardless this is a question better directed towards her internist or primary care doctor not her gynecologist.

## 2012-10-30 NOTE — Telephone Encounter (Signed)
Kathy informed Mr.Polasek regarding this and he will contact with Dr.Gates.

## 2012-11-06 ENCOUNTER — Ambulatory Visit: Payer: BC Managed Care – PPO | Admitting: Sports Medicine

## 2012-12-26 ENCOUNTER — Ambulatory Visit: Payer: BC Managed Care – PPO | Attending: Internal Medicine | Admitting: Physical Therapy

## 2012-12-26 DIAGNOSIS — R262 Difficulty in walking, not elsewhere classified: Secondary | ICD-10-CM | POA: Insufficient documentation

## 2012-12-26 DIAGNOSIS — IMO0001 Reserved for inherently not codable concepts without codable children: Secondary | ICD-10-CM | POA: Insufficient documentation

## 2013-01-30 ENCOUNTER — Encounter: Payer: Self-pay | Admitting: Neurology

## 2013-01-30 ENCOUNTER — Ambulatory Visit (INDEPENDENT_AMBULATORY_CARE_PROVIDER_SITE_OTHER): Payer: BC Managed Care – PPO | Admitting: Neurology

## 2013-01-30 VITALS — BP 141/84 | HR 80 | Temp 97.0°F | Ht 63.0 in | Wt 135.0 lb

## 2013-01-30 DIAGNOSIS — M25569 Pain in unspecified knee: Secondary | ICD-10-CM

## 2013-01-30 DIAGNOSIS — G20 Parkinson's disease: Secondary | ICD-10-CM

## 2013-01-30 DIAGNOSIS — M25561 Pain in right knee: Secondary | ICD-10-CM

## 2013-01-30 NOTE — Progress Notes (Signed)
Subjective:    Patient ID: Michelle Sims is a 80 y.o. female.  HPI  Interim history:   Michelle Sims is a very pleasant 80 year old right-handed woman who presents for followup consultation of her parkinsonism. She is accompanied by her husband today. She has an underlying medical history of hypothyroidism, hypertension, thyroid cancer, degenerative joint disease, depression, breast cancer, status post thyroidectomy and mastectomy. This is her first visit with me and she previously followed with Michelle Sims and was last seen by him on 12/06/2011, at which time he recommended a DaT scan a trial of Sinemet to help differentiate her symptoms and help diagnose Parkinson's disease. She was advised that before she could have a DaT scan she would have to come off of Celexa. She did not end up having that test, as she enrolled in a study. She was supposed to followup after a couple of months, but did not. However, in the interim she started going to [facility] and got into a clinical trial for Parkinson's fair. She has since then been following at [facility] and was placed on Requip, then Sinemet. Requip caused severe sleepiness. She returns after over a year to establish care here. She has finished the study at [facility] under [facility] and as part of the study, she had 2 DaT scans. She did not take any study drugs. She reports severe R knee pain and has had injections into her knee. She may need a knee replacement surgery, but is afraid. She goes to [employer] at [employer]. She lives at [place] with her husband in independent living and still drives. Her husband states she is a good driver. She has fatigue. She is taking C/L 1 tid, after breakfast, after lunch and at bedtime. She has seen Michelle Sims in sports medicine for her arthritis.   I reviewed Dr. Tressia Danas prior notes and records and below is a summary of that review:  She was initially seen by Dr. Erling Sims on 05/15/2002 for pain in her hips  with evidence of lumbar spinal radiculopathy. She had degenerative disc disease. 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 She did well with conservative therapy. She has a long-standing history of back pain and was treated by Dr. Niel Sims, who recommended she use amitriptyline. She has a over a 5 year history of deteriorating handwriting. Her balance has been declining as well. She has to hold onto the rail, going up and down steps. She has fallen infrequently. Her husband and her son started noticing that she shuffles her feet while walking. She started complaining of fatigue and lack of energy. She has remained fairly independent in her ADLs. She has problems with her memory. She denies a family history of tremor. She has long-standing loss of sense of smell. She does not have constipation or acting out dreams. She denies bowel or bladder incontinence. She has been seen by Dr. Tamala Sims for palpitations. CMP,CBC, TSH, T4, and B12 were normal in 2011 and 2012. She had been on doses of vitamin B6 200 to 100 mg per day. Her vitamin B6 level 08/13/2010 was greater than 100. Her B12 was 695. She discontinued B6. EMG/NCV 8./16/12 was normal except for lumbosacral deinnervation, compatible with lumbosacral radiculopathy. MRI brain from 08/26/2010 showed chronic microvascular changes and mild atrophy. She has seen Dr. Casimiro Sims for depression and was changed from citalopram to sertraline 50  mg daily without any benefit and then started seeing Dr. Harley Sim[facility] at [facility]. She went off of tramadol because of the possibility of serotonergic syndrome, but restarted it. She has been in counseling. She denies numbness in her feet and legs. She describes involuntary movements. She has right knee pain. Basic metabolic panel normal 0000000.  She had features of parkinsonism but decided against a DaT scan. A course of physical  therapy for her gait did not help.   Her Past Medical History Is Significant For: Past Medical History  Diagnosis Date  . Hypertension   . Blood type, Rh negative   . Thyroid cancer   . Breast cancer   . Endocervical polyp   . Parkinson disease     Her Past Surgical History Is Significant For: Past Surgical History  Procedure Laterality Date  . Breast surgery  1979    MASTECTOMY AND RECONSTRUCTION  . Appendectomy  1979  . Tubal ligation    . Dilation and curettage of uterus      MISCARRIAGE  . Tonsillectomy    . Hysteroscopy  [id number]    AND D&C  . Thyroidectomy      Her Family History Is Significant For: Family History  Problem Relation Age of Onset  . Hypertension Mother   . Hyperlipidemia Mother   . Hypertension Father   . Hyperlipidemia Father   . Hyperlipidemia Brother   . Hypertension Brother   . Heart failure Mother   . Heart failure Father     Her Social History Is Significant For: History   Social History  . Marital Status: Married    Spouse Name: N/A    Number of Children: N/A  . Years of Education: N/A   Social History Main Topics  . Smoking status: Never Smoker   . Smokeless tobacco: Never Used  . Alcohol Use: Yes     Comment: rare  . Drug Use: No  . Sexual Activity: No   Other Topics Concern  . None   Social History Narrative  . None    Her Allergies Are:  Allergies  Allergen Reactions  . Ciprofloxacin   . Macrobid [Nitrofurantoin] Nausea Only  . Prednisone     Make her swell   . Sulfamethoxazole-Trimethoprim   :   Her Current Medications Are:  Outpatient Encounter Prescriptions as of 01/30/2013  Medication Sig  . amLODipine (NORVASC) 5 MG tablet Take 5 mg by mouth daily.    . Ascorbic Acid (VITAMIN C PO) Take 1 tablet by mouth daily.    . Calcium Carbonate-Vitamin D (CALCIUM + D PO) Take by mouth.  . carbidopa-levodopa (SINEMET IR) 25-100 MG per tablet Take 1 tablet by mouth 3 (three) times daily.   . Cholecalciferol  (VITAMIN D PO) Take by mouth.  . clonazePAM (KLONOPIN) 0.5 MG tablet Take 0.25 mg by mouth at bedtime.   . DULoxetine (CYMBALTA) 30 MG capsule Take 1 capsule by mouth daily.  . fish oil-omega-3 fatty acids 1000 MG capsule Take 2 g by mouth daily.    . irbesartan (AVAPRO) 75 MG tablet Take 75 mg by mouth daily.  Marland Kitchen LEVOTHYROXINE SODIUM PO Take 1 mg by mouth daily.   . methenamine (HIPREX) 1 G tablet Take 1 g by mouth daily.   . NONFORMULARY OR COMPOUNDED ITEM Estradiol 0.02% vaginal cream. Prefilled applicators.  S:  Insert vaginally three times weekly.  . raloxifene (EVISTA) 60 MG tablet Take 1 tablet (60 mg total) by mouth daily.  . hydrocortisone  2.5 % ointment Apply 1 application topically at bedtime.  . [DISCONTINUED] Vortioxetine HBr (BRINTELLIX) 5 MG TABS Take 10 mg by mouth daily.   :  Review of Systems:  Out of a complete 14 point review of systems, all are reviewed and negative with the exception of these symptoms as listed below:   Review of Systems  Constitutional: Positive for fatigue.  HENT: Negative.   Eyes: Negative.   Respiratory: Negative.   Cardiovascular: Negative.   Gastrointestinal: Negative.   Endocrine: Negative.   Genitourinary: Negative.   Musculoskeletal: Negative.   Skin: Negative.   Allergic/Immunologic: Negative.   Neurological: Positive for weakness.       Memory loss  Hematological: Bruises/bleeds easily.  Psychiatric/Behavioral: Positive for confusion and dysphoric mood. The patient is nervous/anxious.     Objective:  Neurologic Exam  Physical Exam Physical Examination:   Filed Vitals:   01/30/13 0907  BP: 141/84  Pulse: 80  Temp: 97 F (36.1 C)    General Examination: The patient is a very pleasant 80 y.o. female in no acute distress.  HEENT: Normocephalic, atraumatic, pupils are equal, round and reactive to light and accommodation. Funduscopic exam is normal with sharp disc margins noted. Extraocular tracking shows minimal saccadic  breakdown without nystagmus noted. There is no limitation to her gaze. There is mild decrease in eye blink rate. Hearing is intact. Tympanic membranes are clear bilaterally. Face is symmetric with minimal facial masking and normal facial sensation. There is no lip, neck or jaw tremor. Neck is mildly rigid with intact passive ROM. There are no carotid bruits on auscultation. Oropharynx exam reveals mild mouth dryness. No significant airway crowding is noted. Mallampati is class II. Tongue protrudes centrally and palate elevates symmetrically.   There is no drooling.   Chest: is clear to auscultation without wheezing, rhonchi or crackles noted.  Heart: sounds are regular and normal without murmurs, rubs or gallops noted.   Abdomen: is soft, non-tender and non-distended with normal bowel sounds appreciated on auscultation.  Extremities: There is no pitting edema in the distal lower extremities bilaterally. Pedal pulses are intact. There are no varicose veins.  Skin: is warm and dry with no trophic changes noted. Age-related changes are noted on the skin.   Musculoskeletal: exam reveals no obvious joint deformities, tenderness, joint swelling or erythema, except for R knee swelling and pain. She has R genu valgus. She has a compression sleeve on her right arm because of a history of lymphedema after her breast cancer treatment.   Neurologically:  Mental status: The patient is awake and alert, paying good  attention. She is able to completely provide the history. Her husband provides details. She is oriented to: person, place, time/date, situation, day of week, month of year and year. Her memory, attention, language and knowledge are fairly intact. There is no aphasia, agnosia, apraxia or anomia. There is a mild degree of bradyphrenia. Speech is mildly hypophonic with no dysarthria noted. Mood is congruent and affect is normal.    Cranial nerves are as described above under HEENT exam. In addition,  shoulder shrug is normal with equal shoulder height noted.  Motor exam: Normal bulk, and strength for age is noted. There are no dyskinesias noted. Tone is very mildly rigid really only in the right upper extremity with absence of cogwheeling. There is overall mild bradykinesia. There is no drift or rebound.  There is no tremor.   Romberg is negative.  Reflexes are 1+ in the upper extremities and  1+ in the lower extremities.   Fine motor skills exam: Finger taps are mildly impaired on the right and minimally impaired on the left. Hand movements are mildly impaired on the right and minimally impaired on the left. RAP (rapid alternating patting) is minimally impaired on the right and not impaired on the left. Foot taps are mildly impaired on the right and minimally impaired on the left. Foot agility (in the form of heel stomping) is minimally impaired on the right and not impaired on the left.    Cerebellar testing shows no dysmetria or intention tremor on finger to nose testing. Heel to shin is difficult for her d/t R knee pain. There is no truncal or gait ataxia.   Sensory exam is intact to light touch, pinprick, vibration, temperature sense in the upper and lower extremities.   Gait, station and balance: She stands up from the seated position with mild difficulty and does need to push up with Her hands. She needs no assistance. No veering to one side is noted. She c/o pain in her R knee and stiffness when she first stands up. She is not noted to lean to the side. Posture is mildly stooped, but could be age-appropriate. Stance is wide-based. She walks with decrease in stride length and pace and fairly preserved arm swing. She walks somewhat cautiously and has a limp on the right. She turns in 3 steps. Tandem walk is not possible. Balance is mildly impaired. She is not able to do a toe or heel stance.     Assessment and Plan:    In summary, KC SEDLAK is a very pleasant 80 y.o.-year old female  with a history of parkinsonism, most likely R sided predominant Parkinson's disease, akinetic-rigid type. Her physical exam is stable and has not progressed very much in the last year. All in all she has very mild findings. This is reassuring. She has finished a study at [facility] which included undergoing two DaT scans in the last year. She has been taking Sinemet 3 times a day but has been taking it with her meals. She is advised to take the Sinemet the way from her mealtimes to ensure better absorption. I suggested that we not change her medication regimen otherwise. I talked to her and her husband at length today about her diagnosis, its prognosis and treatment options. We talked about medical treatments and non-pharmacological approaches. We talked about maintaining a healthy lifestyle in general. I encouraged the patient to eat healthy, exercise daily and keep well hydrated, to keep a scheduled bedtime and wake time routine, to not skip any meals and eat healthy snacks in between meals and to have protein with every meal. In particular, I stressed the importance of regular exercise, within of course the patient's own mobility limitations. She is advised to start using a single prong cane for additional support because of her right knee pain. She's also encouraged to make an appointment with her sports medicine doctor for followup of her right knee arthritis.  As far as further diagnostic testing is concerned, I suggested: no new test needed.  As far as medications are concerned, I recommended the following at this time: no change.  I answered all her questions today and the patient and her husband were in agreement with the above outlined plan. I would like to see the patient back in 6 months, sooner if the need arises and encouraged them to call with any interim questions, concerns, problems or updates and refill  requests. She did not need any refills on the Sinemet today.  Thank you very much for allowing me  to participate in the care of this nice patient. If I can be of any further assistance to you please do not hesitate to call me at [phone].  Sincerely,   Star Age, MD, PhD

## 2013-01-30 NOTE — Patient Instructions (Addendum)
I think your Parkinson's disease has remained fairly stable, which is reassuring. Nevertheless, as you know, this disease does progress with time. It can affect your balance, your memory, your mood, your bowel and bladder function, your posture, balance and walking. Overall you are doing fairly well but I do want to suggest a few things today:  Remember to drink plenty of fluid, eat healthy meals and do not skip any meals. Try to eat protein with a every meal and eat a healthy snack such as fruit or nuts in between meals. Try to keep a regular sleep-wake schedule and try to exercise daily, particularly in the form of walking, 20-30 minutes a day, if you can.   Taking your medication on schedule is key. Sinemet (generic name: carbidopa-levodopa) 25/100 mg: Take 1 pill 3 times a day. Please try to take the medication away from you mealtimes, that is, ideally either one hour before or 2 hours after your meal to ensure optimal absorption. The medication can interfere with the protein content of your meal and trying to the protein in your food and therefore not get fully absorbed. Common side effects reported are: Nausea, vomiting, sedation, confusion, lightheadedness. Rare side effects include hallucinations, severe nausea or vomiting, diarrhea and significant drop in blood pressure especially when going from lying to standing or from sitting to standing.   Try to stay active physically and mentally. Engage in social activities in your community and with your family and try to keep up with current events by reading the newspaper or watching the news. Try to do word puzzles and you may like to do word puzzles and brain games on the computer such as on [email or web address]/.   As far as your medications are concerned, I would like to suggest that you take your current medication with the following additional changes: no additional changes other than above.     As far as diagnostic testing, I will order: no new test  needed.    I would like to see you back in 6 months, sooner if we need to. Please call us with any interim questions, concerns, problems, updates or refill requests.  Our nursing staff will answer any of your questions and relay your messages to me and also relay most of my messages to you.  Our phone number is [phone]. We also have an after hours call service for urgent matters and there is a physician on-call for urgent questions, that cannot wait till the next work day. For any emergencies you know to call 911 or go to the nearest emergency room.

## 2013-02-06 ENCOUNTER — Encounter: Payer: Self-pay | Admitting: Sports Medicine

## 2013-02-06 ENCOUNTER — Ambulatory Visit (INDEPENDENT_AMBULATORY_CARE_PROVIDER_SITE_OTHER): Payer: BC Managed Care – PPO | Admitting: Sports Medicine

## 2013-02-06 VITALS — BP 144/85 | HR 77 | Ht 63.0 in | Wt 135.0 lb

## 2013-02-06 DIAGNOSIS — IMO0002 Reserved for concepts with insufficient information to code with codable children: Secondary | ICD-10-CM

## 2013-02-06 DIAGNOSIS — M1710 Unilateral primary osteoarthritis, unspecified knee: Secondary | ICD-10-CM

## 2013-02-06 DIAGNOSIS — S43429A Sprain of unspecified rotator cuff capsule, initial encounter: Secondary | ICD-10-CM

## 2013-02-06 DIAGNOSIS — M75102 Unspecified rotator cuff tear or rupture of left shoulder, not specified as traumatic: Secondary | ICD-10-CM

## 2013-02-06 NOTE — Assessment & Plan Note (Signed)
Patient does not wish to take more medications and feels that she can deal with the pain  We will try her on a knee compression wrap  Continue to use the inserts for her shoes

## 2013-02-06 NOTE — Assessment & Plan Note (Signed)
Left shoulder now shows some limitation of external rotation Limitation of abduction and elevation Positive frozen shoulder sign  Negative impingement testing and the rotator cuff is relatively strong for her age  She needs to begin a series of Codman exercises Try to reestablish more normal motion

## 2013-02-06 NOTE — Progress Notes (Signed)
Patient ID: Michelle Sims, female   DOB: 02-23-1929, 80 y.o.   MRN: [id number] Patient is an 80 year old with known stage IV osteoarthritis of the right knee This knee has progressively hurt worse She has early Parkinson's disease and does not want to undergo knee replacement The knee pain is primarily after getting up from being seated Also occurs if she is to walk up steps or walks for too long  She is still able to walk quite a bit for her regular activities She does water aerobics on a regular basis  She did have good relief with steroid injections up until about one year ago on her last steroid injection she had systemic symptoms with flushing and redness and she did not continue to get any relief of pain  Examination No acute distress BP 144/85  Pulse 77  Ht 5\' 3"  (1.6 m)  Wt 135 lb (61.236 kg)  BMI 23.92 kg/m2  Right knee is into 15 of genu valgus There are significant chronic degenerative changes There is some puffiness and possible effusion in the suprapatellar area and the medial compartment She lacks a few degrees of full extension and can get flexion past 90 In the office she is able to walk back and forth without pain but she gets a rapid varus- valgus shift of the knee We have already placed corrections in her shoes and these help somewhat

## 2013-02-11 ENCOUNTER — Ambulatory Visit: Payer: BC Managed Care – PPO | Attending: Internal Medicine | Admitting: Physical Therapy

## 2013-02-11 DIAGNOSIS — IMO0001 Reserved for inherently not codable concepts without codable children: Secondary | ICD-10-CM | POA: Insufficient documentation

## 2013-02-11 DIAGNOSIS — R262 Difficulty in walking, not elsewhere classified: Secondary | ICD-10-CM | POA: Insufficient documentation

## 2013-04-10 ENCOUNTER — Telehealth: Payer: Self-pay | Admitting: Neurology

## 2013-04-10 NOTE — Telephone Encounter (Signed)
Pt's husband Michelle Sims called stated pt started feeling bad on her stomach and weak. Michelle Sims feels like it is coming from where she is taking her carbidopa-levodopa (SINEMET IR) 25-100 MG per tablet at a different time, pt use to take it after meals stated that Dr. Rexene Alberts advised pt to take on empty stomach per Michelle Sims. This started in Feb. And has been going on since, I asked Michelle Sims if pt is taking it right before her meals he said no she takes it earlier than that. Michelle Sims would like for Dr. Rexene Alberts to please call him back concerning this problem. Thanks

## 2013-04-10 NOTE — Telephone Encounter (Signed)
Called pt's husband Michelle Sims to inform him about the pt taking carbidopa-levodopa. Pt's  Husband stated that the pt was feeling bad and sick on her stomach, since Dr. Rexene Alberts advise the pt to start taking the medication on a empty stomach. I advised the husband, per last OV note on 01/30/13, that Dr. Rexene Alberts stated for the pt to take the medication 1 hr before meals or 2 hrs after meals to ensure optimal absorption, because the medication can interfere with the protein content of the pt's meal. I advised the pt's husband that if the pt has any other problems, questions or concerns to call the office. Pt's husband verbalized understanding.

## 2013-04-16 ENCOUNTER — Encounter: Payer: Self-pay | Admitting: Sports Medicine

## 2013-04-16 ENCOUNTER — Ambulatory Visit (INDEPENDENT_AMBULATORY_CARE_PROVIDER_SITE_OTHER): Payer: BC Managed Care – PPO | Admitting: Sports Medicine

## 2013-04-16 VITALS — BP 134/84 | HR 76 | Ht 63.0 in | Wt 135.0 lb

## 2013-04-16 DIAGNOSIS — IMO0002 Reserved for concepts with insufficient information to code with codable children: Secondary | ICD-10-CM

## 2013-04-16 DIAGNOSIS — M1710 Unilateral primary osteoarthritis, unspecified knee: Secondary | ICD-10-CM

## 2013-04-16 NOTE — Assessment & Plan Note (Signed)
No easy solution  Try new type of unloader brace  Try devil's claw  Consider LImbrel or NTG if no response  rtc 1 mo

## 2013-04-16 NOTE — Progress Notes (Signed)
   Subjective:    Patient ID: Michelle Sims, female    DOB: 26-Nov-1929, 80 y.o.   MRN: [id number]  HPI  Michelle Sims has parkinson's syndrome and a history of severe right knee osteoarthritis and presenting today with worsening knee pain. She went to Dr. Maureen Ralphs in February who said she would not be a good candidate for knee replacement. Knee pain has gotten worse since then and hurts all over the knee and with any kind of walking. She has a "fear of falling" due to knee pain and instability as well as parkinson's related balance issues.  Denies locking, buckling, or night-time knee pain. She does not take any medication for the knee pain because she "is already on so many medicines." She has tried a compression sleeve, ice, heat, previous injection without significant help. She goes to physical therapy with [facility] and this seems to help.    Review of Systems Negative apart from HPI     Objective:   Physical Exam Elderly female in NAD BP 134/84  Pulse 76  Ht 5\' 3"  (1.6 m)  Wt 135 lb (61.236 kg)  BMI 23.92 kg/m2  Knee (Right) Inspection: valgus deformity. Mild swelling. No erythema Palpation: No specific areas of tenderness noted on exam ROM: 15 degrees loss of extension on right compared to left. Flexion normal. Normal strength bilaterally.  Marked DJD of medial compt with spurring and deformity      Assessment & Plan:  80 yo with severe right knee osteoarthritis  1. Osteoarthritis: Not a good surgical candidate --Will return to be fitted for support brace --Devil's Claw supplements  --Follow-up in 1 month to see how supplements are doing. Will consider trying other supplements if no improvement with Devil's Claw.   Seen with Jacqulyn Liner, MS4

## 2013-04-22 ENCOUNTER — Telehealth: Payer: Self-pay | Admitting: Neurology

## 2013-04-22 NOTE — Telephone Encounter (Signed)
Patient's husband called to state that he has some questions about patient's Sinemet medication, please call patient's husband and advise.

## 2013-04-22 NOTE — Telephone Encounter (Signed)
Please have Dr. Rexene Alberts return call to patient's husband not patient--he says he is the one that needs to talk to the doctor-- he would like a call tomorrow if possible--thank you.

## 2013-04-22 NOTE — Telephone Encounter (Signed)
I spoke with the patient.  She said Dr Inda Merlin advised her to cut back on Sinemet to one half three times daily, which she has done, due to fatigue.  Says she is unsure if this is helping or not, as she is still tired.  She would like a message sent to the provider to ask if there are any other suggestions.  Please advise.  Thank you.

## 2013-04-23 NOTE — Telephone Encounter (Signed)
Patient's husband calling to state that he is about to go to a meeting and will not be able to answer his phone. Patient's husband is very anxious to talk with Dr. Rexene Alberts, so he would like to be called sometime this evening at the home number or tomorrow morning at [phone].

## 2013-04-23 NOTE — Telephone Encounter (Signed)
Spoke with husband and he said that he would like for Dr Rexene Alberts to call him personally at:( [phone] only),concerning sinemet medication that was decreased  by pcp,. Patient's symptoms have been aleviated but has some concerns, should she stick with the 1/2 tablet or go back to original dosage to see if symptoms reoccur and if she goes back would it be a gradual change like it was done at [facility].

## 2013-04-23 NOTE — Telephone Encounter (Signed)
Patient's husband is calling to follow up on the status, he is anxious for a call back regarding this message. Patient's husband states DO NOT call house number, please call patient's husband!

## 2013-04-23 NOTE — Telephone Encounter (Signed)
I called patient's home number but was not able to reach her husband. I talked to the patient. She had been experiencing fatigue and foggy headedness and her primary care physician suggested that she reduce her Sinemet to half a pill 3 times a day. She had started taking the Sinemet one hour before her meals and perhaps that had resulted in better absorption and more side effects. She is advised to continue with half a pill 3 times a day and if she feels that she has issues with more slowness and more stiffness and decrease in mobility she may have to go back to a whole pill 3 times a day. The patient demonstrated understanding and voiced agreement.

## 2013-04-25 ENCOUNTER — Ambulatory Visit: Payer: BC Managed Care – PPO | Admitting: Sports Medicine

## 2013-05-07 ENCOUNTER — Other Ambulatory Visit: Payer: Self-pay | Admitting: Internal Medicine

## 2013-05-07 DIAGNOSIS — M8580 Other specified disorders of bone density and structure, unspecified site: Secondary | ICD-10-CM

## 2013-05-08 ENCOUNTER — Other Ambulatory Visit: Payer: Self-pay

## 2013-05-08 DIAGNOSIS — Z1231 Encounter for screening mammogram for malignant neoplasm of breast: Secondary | ICD-10-CM

## 2013-05-08 DIAGNOSIS — Z853 Personal history of malignant neoplasm of breast: Secondary | ICD-10-CM

## 2013-05-15 ENCOUNTER — Other Ambulatory Visit: Payer: BC Managed Care – PPO

## 2013-05-22 ENCOUNTER — Encounter: Payer: Self-pay | Admitting: Sports Medicine

## 2013-05-22 ENCOUNTER — Ambulatory Visit (INDEPENDENT_AMBULATORY_CARE_PROVIDER_SITE_OTHER): Payer: BC Managed Care – PPO | Admitting: Sports Medicine

## 2013-05-22 VITALS — BP 135/84 | HR 75 | Ht 63.0 in | Wt 135.0 lb

## 2013-05-22 DIAGNOSIS — M1710 Unilateral primary osteoarthritis, unspecified knee: Secondary | ICD-10-CM

## 2013-05-22 DIAGNOSIS — IMO0002 Reserved for concepts with insufficient information to code with codable children: Secondary | ICD-10-CM

## 2013-05-22 DIAGNOSIS — M1711 Unilateral primary osteoarthritis, right knee: Secondary | ICD-10-CM

## 2013-05-22 MED ORDER — TRAMADOL HCL 50 MG PO TABS: 50.0000 mg | ORAL_TABLET | Freq: Two times a day (BID) | ORAL | Status: DC

## 2013-05-22 NOTE — Patient Instructions (Signed)
Thank you for coming in today  1. Continue tumeric for another 4-6 weeks to see if this helps you 2. Try tramadol 2x per day in the morning and at about 4pm  3. If you are still having trouble in a couple weeks then call us and we can try the viscosupplementation injections like Synvisc or Supartz 4. Followup 1 month

## 2013-05-24 DIAGNOSIS — M8580 Other specified disorders of bone density and structure, unspecified site: Secondary | ICD-10-CM

## 2013-05-24 HISTORY — DX: Other specified disorders of bone density and structure, unspecified site: M85.80

## 2013-05-28 NOTE — Progress Notes (Signed)
CC: Followup knee pain HPI: Patient is 80 year old female with past medical history of right severe knee osteoarthritis as well as Parkinson's disease who presents for followup of knee pain. Unfortunately she is not a candidate for knee replacement surgery. She continues to struggle with her knee pain. She recently did start tomorrow is not sure of this is helping or not. She notes that she is very limited in her day-to-day activities because of her knee pain.  ROS: As above in the HPI. All other systems are stable or negative.  OBJECTIVE: APPEARANCE:  Patient in no acute distress.The patient appeared well nourished and normally developed. HEENT: No scleral icterus. Conjunctiva non-injected Resp: Non labored Skin: No rash MSK:  Right knee: There is no swelling or deformity. No effusion. Joint line tenderness both medially and laterally. Mildly antalgic gait. No sign of infection.  MSK [place]: Not performed   ASSESSMENT: Right knee pain secondary to severe DJD of the right knee   PLAN: 1. Continue tumeric for another 4-6 weeks to see if this helps you  2. Try tramadol 2x per day in the morning and at about 4pm  3. If you are still having trouble in a couple weeks then call us and we can try the viscosupplementation injections like Synvisc or Supartz  4. Followup 1 month

## 2013-06-13 ENCOUNTER — Ambulatory Visit
Admission: RE | Admit: 2013-06-13 | Discharge: 2013-06-13 | Disposition: A | Discharge status: Home / Self Care | Payer: BC Managed Care – PPO | Source: Ambulatory Visit | Attending: Internal Medicine | Admitting: Internal Medicine

## 2013-06-13 ENCOUNTER — Ambulatory Visit
Admission: RE | Admit: 2013-06-13 | Discharge: 2013-06-13 | Disposition: A | Discharge status: Home / Self Care | Payer: BC Managed Care – PPO | Source: Ambulatory Visit

## 2013-06-13 ENCOUNTER — Encounter (INDEPENDENT_AMBULATORY_CARE_PROVIDER_SITE_OTHER): Payer: Self-pay

## 2013-06-13 DIAGNOSIS — Z853 Personal history of malignant neoplasm of breast: Secondary | ICD-10-CM

## 2013-06-13 DIAGNOSIS — M8580 Other specified disorders of bone density and structure, unspecified site: Secondary | ICD-10-CM

## 2013-06-13 DIAGNOSIS — Z1231 Encounter for screening mammogram for malignant neoplasm of breast: Secondary | ICD-10-CM

## 2013-06-18 ENCOUNTER — Encounter: Payer: Self-pay | Admitting: Gynecology

## 2013-06-19 ENCOUNTER — Telehealth: Payer: Self-pay | Admitting: *Deleted

## 2013-06-19 NOTE — Telephone Encounter (Signed)
Pt requesting dexa results, would like to know if any change from last study.  Please advise

## 2013-06-20 NOTE — Telephone Encounter (Signed)
The bone density did show some loss at the measured sites. It is still in the osteopenic range. As she is on Evista I would recommend continuing and then we will repeat the bone density in 2 years. If it continues to show loss at some point then I may suggest switching or adding a separate medication but I'm not sure that we are at that point right now.

## 2013-06-20 NOTE — Telephone Encounter (Signed)
Left message for pt to call.

## 2013-06-20 NOTE — Telephone Encounter (Signed)
Pt informed with the below note, also of recent mammogram results.

## 2013-06-25 ENCOUNTER — Ambulatory Visit: Payer: BC Managed Care – PPO | Admitting: Sports Medicine

## 2013-07-04 ENCOUNTER — Encounter: Payer: Self-pay | Admitting: Sports Medicine

## 2013-07-04 ENCOUNTER — Ambulatory Visit (INDEPENDENT_AMBULATORY_CARE_PROVIDER_SITE_OTHER): Payer: BC Managed Care – PPO | Admitting: Sports Medicine

## 2013-07-04 VITALS — BP 146/80 | Ht 63.0 in | Wt 135.0 lb

## 2013-07-04 DIAGNOSIS — M1710 Unilateral primary osteoarthritis, unspecified knee: Secondary | ICD-10-CM

## 2013-07-04 DIAGNOSIS — IMO0002 Reserved for concepts with insufficient information to code with codable children: Secondary | ICD-10-CM

## 2013-07-04 NOTE — Progress Notes (Signed)
Patient ID: Michelle Sims, female   DOB: 1929/10/27, 80 y.o.   MRN: [id number] 80 yo female with history of osteoarthritis who presents for re-evaluation and suppartz injection.  Continues to have pain.  Worse with prolonged sitting, then getting up.  No new injuries.  Has been progressively worsening since last visit.  PPMH:  Hypertension, early parkinsons  SH:  Nonsmoker, occasional alcohol  ROS:  As per HPI, otherwise negative.  Examination: BP 146/80  Ht 5\' 3"  (1.6 m)  Wt 135 lb (61.236 kg)  BMI 23.92 kg/m2 Right Knee: Normal to inspection with no erythema or effusion or obvious bony abnormalities. Palpation  with  joint line tenderness ROM full in flexion and extension and lower leg rotation. Ligaments with solid consistent endpoints Patellar glide with crepitus. Patellar and quadriceps tendons unremarkable. Hamstring and quadriceps strength is normal.   Procedure:  Injection of right knee with ultrasound guidance Note on [place] she had a small effusion more along lateral super patellar pouch This was identified and the injection was into the pouch Consent obtained and verified. Time-out conducted. Noted no overlying erythema, induration, or other signs of local infection. Skin prepped in a sterile fashion. Topical analgesic spray: Ethyl chloride. Then 1% lidocaine wheal Completed without difficulty. Directed into suprapatellar pouch Meds: suppartz Pain immediately improved suggesting accurate placement of the medication. Advised to call if fevers/chills, erythema, induration, drainage, or persistent bleeding.

## 2013-07-04 NOTE — Assessment & Plan Note (Addendum)
1st Suppartz injection given today  We will try a course of these to see if it helps  She is taking Turmeric and that is OK to use but she is not sure of benefit

## 2013-07-04 NOTE — Progress Notes (Signed)
Sample supartz given; R knee [id number] EXP:2017/03 BIOVENTUS

## 2013-07-11 ENCOUNTER — Ambulatory Visit (INDEPENDENT_AMBULATORY_CARE_PROVIDER_SITE_OTHER): Payer: BC Managed Care – PPO | Admitting: Sports Medicine

## 2013-07-11 ENCOUNTER — Encounter: Payer: Self-pay | Admitting: Sports Medicine

## 2013-07-11 VITALS — BP 128/80 | Ht 63.0 in | Wt 135.0 lb

## 2013-07-11 DIAGNOSIS — M1710 Unilateral primary osteoarthritis, unspecified knee: Secondary | ICD-10-CM

## 2013-07-11 DIAGNOSIS — IMO0002 Reserved for concepts with insufficient information to code with codable children: Secondary | ICD-10-CM

## 2013-07-11 NOTE — Progress Notes (Signed)
SUPARTZ  RIGHT KNEE LOT NUMBER [id number] EXP: 2017.03

## 2013-07-11 NOTE — Assessment & Plan Note (Addendum)
Doing well, knee pain remains. No signs of infection. - 2nd of series of 3 supartz injections given today. - Patient tolerated procedure well. - She is to return for next injection in 1 week. - She may resume pool exercises in 4 days. - Return precautions reviewed.

## 2013-07-11 NOTE — Progress Notes (Signed)
Subjective:   CC: Right knee pain  HPI:   Patient is a 80 y.o. female with h/o severe right DJD here for 2nd in series of 3 supartz injections. She does not report any new symptoms, fevers, chills, new joint swelling or redness or heat. She would like to get back to pool exercises.   Review of Systems - Per HPI.  SH: Pool exercises   Objective:  Physical Exam BP 128/80  Ht 5\' 3"  (1.6 m)  Wt 135 lb (61.236 kg)  BMI 23.92 kg/m2 GEN: NAD EXTR: Bilateral knee bony enlargement Right knee nonerythematous and not swollen    Procedure A second supartz injection was performed at the right superior suprapatellar pouch from lateral view under ultarsound guidance with sterile gel and cool spray. This was well tolerated.  Assessment:     Michelle Sims is a 80 y.o. female with h/o right knee DJD here for follow up and 2nd supartz injection.    Plan:     Right knee DJD follow up Doing well, knee pain remains. No signs of infection. - 2nd of series of 3 supartz injections given today. - Patient tolerated procedure well. - She is to return for next injection in 1 week. - She may resume pool exercises in 4 days. - Return precautions reviewed.    Hilton Sinclair, MD [place],  Ila Mcgill, MD

## 2013-07-18 ENCOUNTER — Ambulatory Visit (INDEPENDENT_AMBULATORY_CARE_PROVIDER_SITE_OTHER): Payer: BC Managed Care – PPO | Admitting: Sports Medicine

## 2013-07-18 DIAGNOSIS — M1710 Unilateral primary osteoarthritis, unspecified knee: Secondary | ICD-10-CM

## 2013-07-18 DIAGNOSIS — IMO0002 Reserved for concepts with insufficient information to code with codable children: Secondary | ICD-10-CM

## 2013-07-18 MED ORDER — SODIUM HYALURONATE (VISCOSUP) 25 MG/2.5ML IX SOSY
25.0000 mg | PREFILLED_SYRINGE | Freq: Once | INTRA_ARTICULAR | Status: AC
  Administered 2013-07-18: 25 mg via INTRA_ARTICULAR

## 2013-07-18 NOTE — Progress Notes (Signed)
Supartz info enterED in the [facility]  80 yo F with grade 4 DJD of RT knee Here for 3/3 of injections of Supartz  No real difference in symptoms with daily right knee pain She is unable to tell whether there is less swelling Very painful to get out of the chair  Examination No acute distress There were no vitals taken for this visit.  Right knee shows significant chronic degenerative changes as noted before There is puffiness in the right superior patellar area Slight bruising at the site of her last injection Limitation of both full extension and flexion  On ultrasound examination the suprapatellar pouch effusion is somewhat smaller than on visit last week

## 2013-07-18 NOTE — Assessment & Plan Note (Addendum)
Procedure:  Injection of RT Knee Consent obtained and verified. Time-out conducted. Noted no overlying erythema, induration, or other signs of local infection. Skin prepped in a sterile fashion. Topical analgesic spray: Ethyl chloride. And then we placed a 1% lidocaine wheal Under ultrasound guidance we directed the Supartz into the suprapatellar pouch without difficulty  Meds: 1% Xylocaine for numbing. Followed by Jacklyn Shell injection Pain immediately improved suggesting accurate placement of the medication. Advised to call if fevers/chills, erythema, induration, drainage, or persistent bleeding.  Band-Aid applied after injection and then a light wrap with Coban which she can remove in 3-4 hours  Monitor response to one month and then return for evaluation

## 2013-07-30 ENCOUNTER — Ambulatory Visit (INDEPENDENT_AMBULATORY_CARE_PROVIDER_SITE_OTHER): Payer: BC Managed Care – PPO | Admitting: Neurology

## 2013-07-30 ENCOUNTER — Encounter (INDEPENDENT_AMBULATORY_CARE_PROVIDER_SITE_OTHER): Payer: Self-pay

## 2013-07-30 ENCOUNTER — Encounter: Payer: Self-pay | Admitting: Neurology

## 2013-07-30 VITALS — BP 138/83 | HR 85 | Temp 97.6°F | Ht 63.0 in

## 2013-07-30 DIAGNOSIS — M25569 Pain in unspecified knee: Secondary | ICD-10-CM

## 2013-07-30 DIAGNOSIS — C50911 Malignant neoplasm of unspecified site of right female breast: Secondary | ICD-10-CM

## 2013-07-30 DIAGNOSIS — G20 Parkinson's disease: Secondary | ICD-10-CM

## 2013-07-30 DIAGNOSIS — M25561 Pain in right knee: Secondary | ICD-10-CM

## 2013-07-30 DIAGNOSIS — C50919 Malignant neoplasm of unspecified site of unspecified female breast: Secondary | ICD-10-CM

## 2013-07-30 MED ORDER — CARBIDOPA-LEVODOPA 25-100 MG PO TABS: 1.0000 | ORAL_TABLET | Freq: Three times a day (TID) | ORAL | Status: DC

## 2013-07-30 NOTE — Progress Notes (Signed)
Subjective:    Patient ID: Michelle Sims is a 80 y.o. female.  HPI    Interim history:   [name] is a very pleasant 80 year old right-handed woman an underlying medical history of chronic back pain, hypertension, thyroid cancer, breast cancer, status post thyroidectomy, tonsillectomy, appendectomy, tubal ligation and mastectomy with reconstruction, who presents for followup consultation of her parkinsonism probable right-sided predominant Parkinson's disease of the akinetic-rigid type. She's accompanied by her husband again today. I first met her on 01/30/2013, at which time it felt her exam was stable. She had participated in a study at [facility] which included taking a DaT scan. She had been taking Sinemet 3 times a day but was taking it with her meals. The only thing I suggested at the time of her first visit was to take the Sinemet the way from her mealtimes to ensure better absorption. In the interim, her husband called back stating that she had been experiencing some nausea. Her primary care physician reduced her Sinemet to half a pill 3 times a day. I talked to the patient in April and suggested that she continue with half a pill 3 times a day and try taking a whole pill 3 times a day if she noted an increase in her parkinsonian symptoms.   Today, she states, that the PD symptoms seem stable. She is taking sinemet 1 pill tid again. She is on 1/2 pill of clonazepam, ie.0.25 mg at night. She is on Cymbalta 30 mg daily per Dr. Caprice Beaver. She complains of fatigue and naps at least once a day, sometimes 2 hours at a time.    She previously followed with Dr. Morene Antu and was last seen by him on 12/06/2011, at which time he recommended a DaT scan and a trial of Sinemet to help differentiate her symptoms and help diagnose Parkinson's disease. She was advised that before she could have a DaT scan she would have to come off of Celexa. She did not end up having that test, as she enrolled in a study at  [place]. She was supposed to followup after a couple of months, but did not. However, in the interim she started going to [employer] and got into a clinical trial for Parkinson's there. She was placed on Requip, then Sinemet. Requip caused severe sleepiness. She finished the study at [facility] under Dr. Maxine Glenn and as part of the study, she had 2 DaT scans. She did not take any study drugs. She reported severe R knee pain and had injections into her knee. She may need a knee replacement surgery, but is afraid. She goes to [employer] at [employer]. She lives at [place] with her husband in independent living and still drives. Her husband states she is a good driver. She has fatigue. She has seen Dr. Oneida Alar in sports medicine for her arthritis.  She was initially seen by Dr. Erling Cruz on 05/15/2002 for pain in her hips with evidence of lumbar spinal radiculopathy. She had degenerative disc disease. 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 She did well with conservative therapy. She has a long-standing history of back pain and was treated by Dr. Niel Hummer, who recommended she use amitriptyline. She has a over a 5 year history of deteriorating handwriting. Her balance has been declining as well. She has to hold onto the rail, going up and down steps. She has fallen infrequently.  Her husband and her son started noticing that she shuffles her feet while walking. She started complaining of fatigue and lack of energy. She has remained fairly independent in her ADLs. She has problems with her memory. She denies a family history of tremor. She has long-standing loss of sense of smell. She does not have constipation or acting out dreams. She denies bowel or bladder incontinence. She has been seen by Dr. Tamala Julian for palpitations. CMP,CBC, TSH, T4, and B12 were normal in 2011 and 2012. She had been on doses of vitamin B6 200 to  100 mg per day. Her vitamin B6 level 08/13/2010 was greater than 100. Her B12 was 695. She discontinued B6. EMG/NCV 8./16/12 was normal except for lumbosacral deinnervation, compatible with lumbosacral radiculopathy. MRI brain from 08/26/2010 showed chronic microvascular changes and mild atrophy. She has seen Dr. Casimiro Needle for depression and was changed from citalopram to sertraline 50 mg daily without any benefit and then started seeing Dr. Harley Alto at [facility]. She went off of tramadol because of the possibility of serotonergic syndrome, but restarted it. She has been in counseling. She denies numbness in her feet and legs. She describes involuntary movements. She has right knee pain. Basic metabolic panel normal 09/26/69. A course of physical therapy for her gait did not help.   Her Past Medical History Is Significant For: Past Medical History  Diagnosis Date  . Hypertension   . Blood type, Rh negative   . Thyroid cancer   . Breast cancer   . Endocervical polyp   . Parkinson disease   . Osteopenia 05/2013    T score -1.5    Her Past Surgical History Is Significant For: Past Surgical History  Procedure Laterality Date  . Breast surgery  1979    MASTECTOMY AND RECONSTRUCTION  . Appendectomy  1979  . Tubal ligation    . Dilation and curettage of uterus      MISCARRIAGE  . Tonsillectomy    . Hysteroscopy  [id number]    AND D&C  . Thyroidectomy      Her Family History Is Significant For: Family History  Problem Relation Age of Onset  . Hypertension Mother   . Hyperlipidemia Mother   . Hypertension Father   . Hyperlipidemia Father   . Hyperlipidemia Brother   . Hypertension Brother   . Heart failure Mother   . Heart failure Father     Her Social History Is Significant For: History   Social History  . Marital Status: Married    Spouse Name: N/A    Number of Children: N/A  . Years of Education: N/A   Social History Main Topics  . Smoking status: Never Smoker   . Smokeless  tobacco: Never Used  . Alcohol Use: Yes     Comment: rare  . Drug Use: No  . Sexual Activity: No   Other Topics Concern  . Not on file   Social History Narrative  . No narrative on file    Her Allergies Are:  Allergies  Allergen Reactions  . Ciprofloxacin   . Macrobid [Nitrofurantoin] Nausea Only  . Prednisone     Make her swell   . Sulfamethoxazole-Trimethoprim   :   Her Current Medications Are:  Outpatient Encounter Prescriptions as of 07/30/2013  Medication Sig  . amLODipine (NORVASC) 5 MG tablet Take 5 mg by mouth daily.    . Ascorbic Acid (VITAMIN C PO) Take 1 tablet by mouth daily.    . Calcium Carbonate-Vitamin D (  CALCIUM + D PO) Take by mouth.  . carbidopa-levodopa (SINEMET IR) 25-100 MG per tablet Take 1 tablet by mouth 3 (three) times daily.   . Cholecalciferol (VITAMIN D PO) Take by mouth.  . clonazePAM (KLONOPIN) 0.5 MG tablet Take 0.25 mg by mouth at bedtime.   . DULoxetine (CYMBALTA) 30 MG capsule Take 1 capsule by mouth daily.  . fish oil-omega-3 fatty acids 1000 MG capsule Take 2 g by mouth daily.    . irbesartan (AVAPRO) 75 MG tablet Take 75 mg by mouth daily.  Marland Kitchen LEVOTHYROXINE SODIUM PO Take 112 mg by mouth daily.   . methenamine (HIPREX) 1 G tablet Take 1 g by mouth daily.   . NONFORMULARY OR COMPOUNDED ITEM Estradiol 0.02% vaginal cream. Prefilled applicators.  S:  Insert vaginally three times weekly.  . raloxifene (EVISTA) 60 MG tablet Take 1 tablet (60 mg total) by mouth daily.  . traMADol (ULTRAM) 50 MG tablet Take 1 tablet (50 mg total) by mouth 2 (two) times daily.  . [DISCONTINUED] hydrocortisone 2.5 % ointment Apply 1 application topically at bedtime.  :  Review of Systems:  Out of a complete 14 point review of systems, all are reviewed and negative with the exception of these symptoms as listed below:  Review of Systems  Constitutional: Positive for fatigue.  HENT: Negative.   Eyes: Negative.   Respiratory: Negative.   Cardiovascular:  Positive for leg swelling (ankle).  Gastrointestinal: Positive for abdominal distention.  Endocrine: Negative.   Genitourinary: Negative.   Musculoskeletal: Negative.   Allergic/Immunologic: Negative.   Neurological:       Memory loss  Hematological: Bruises/bleeds easily.  Psychiatric/Behavioral: Positive for confusion.    Objective:  Neurologic Exam  Physical Exam Physical Examination:   Filed Vitals:   07/30/13 1147  BP: 138/83  Pulse: 85  Temp: 97.6 F (36.4 C)    General Examination: The patient is a very pleasant 80 y.o. female in no acute distress.  HEENT: Normocephalic, atraumatic, pupils are equal, round and reactive to light and accommodation. Funduscopic exam is normal with sharp disc margins noted. Extraocular tracking shows minimal saccadic breakdown without nystagmus noted. There is no limitation to her gaze. There is mild decrease in eye blink rate. Hearing is intact. Face is symmetric with minimal facial masking and normal facial sensation. There is no lip, neck or jaw tremor. Neck is mildly rigid with intact passive ROM. There are no carotid bruits on auscultation. Oropharynx exam reveals mild mouth dryness. No significant airway crowding is noted. Mallampati is class II. Tongue protrudes centrally and palate elevates symmetrically. There is no drooling.   Chest: is clear to auscultation without wheezing, rhonchi or crackles noted.  Heart: sounds are regular and normal without murmurs, rubs or gallops noted.   Abdomen: is soft, non-tender and non-distended with normal bowel sounds appreciated on auscultation.  Extremities: There is no pitting edema in the distal lower extremities bilaterally. Pedal pulses are intact. There are no varicose veins.  Skin: is warm and dry with no trophic changes noted. Age-related changes are noted on the skin.   Musculoskeletal: exam reveals no obvious joint deformities, tenderness, joint swelling or erythema, except for R knee  swelling and pain. She has R genu valgus. She has a compression sleeve on her right arm because of a history of lymphedema after her breast cancer treatment.   Neurologically:  Mental status: The patient is awake and alert, paying good  attention. She is able to completely provide the history.  Her husband provides details. She is oriented to: person, place, time/date, situation, day of week, month of year and year. Her memory, attention, language and knowledge are fairly intact. There is no aphasia, agnosia, apraxia or anomia. There is a mild degree of bradyphrenia. Speech is mildly hypophonic with no dysarthria noted. Mood is congruent and affect is normal.    Cranial nerves are as described above under HEENT exam. In addition, shoulder shrug is normal with equal shoulder height noted.  Motor exam: Normal bulk, and strength for age is noted. There are no dyskinesias noted. Tone is very mildly rigid really only in the right upper extremity with absence of cogwheeling. There is overall mild bradykinesia. There is no drift or rebound.  There is no tremor.   Romberg is negative.  Reflexes are 1+ in the upper extremities and 1+ in the lower extremities.   Fine motor skills exam: Finger taps are mildly impaired on the right and minimally impaired on the left. Hand movements are mildly impaired on the right and minimally impaired on the left. RAP (rapid alternating patting) is minimally impaired on the right and not impaired on the left. Foot taps are mildly impaired on the right and minimally impaired on the left. Foot agility (in the form of heel stomping) is minimally impaired on the right and not impaired on the left.    Cerebellar testing shows no dysmetria or intention tremor on finger to nose testing. Heel to shin is difficult for her d/t R knee pain. There is no truncal or gait ataxia.   Sensory exam is intact to light touch, pinprick, vibration, temperature sense in the upper and lower extremities.    Gait, station and balance: She stands up from the seated position with mild difficulty and does need to push up with Her hands. She needs no assistance. No veering to one side is noted. She c/o pain in her R knee pain and stiffness when she first stands up. She is not noted to lean to the side. Posture is mildly stooped, but could be age-appropriate. Stance is wide-based, unchanged. She walks with decrease in stride length and pace and decrease in R arm swing. She walks somewhat cautiously and has a limp on the right. She turns in 3 steps. Tandem walk is not possible. Balance is mildly impaired. She is not able to do a toe or heel stance.     Assessment and Plan:    In summary, TANISH SINKLER is a very pleasant 80 year old female with a history of parkinsonism, most likely R sided predominant Parkinson's disease, akinetic-rigid type. Her physical exam is stable and has not progressed very much in the last year. All in all she has very mild findings, which is reassuring. She finished a study at [facility] which included undergoing two DaT scans in the last year. She has been taking Sinemet 3 times a day. I would like to get records from [facility]. I suggested we continue with Sinemet at the current dose. I talked to her and her husband at length today about her diagnosis, its prognosis and treatment options. We talked about medical treatments and non-pharmacological approaches. We talked about maintaining a healthy lifestyle in general. I encouraged the patient to eat healthy, exercise daily and keep well hydrated, to keep a scheduled bedtime and wake time routine, to not skip any meals and eat healthy snacks in between meals and to have protein with every meal. In particular, I stressed the importance of regular exercise,  within of course the patient's own mobility limitations. She is advised to start using a single prong cane for additional support because of her right knee pain. She's also encouraged to make an  appointment with her orthopedic surgeon for her right knee arthritis.  As far as further diagnostic testing is concerned, I suggested: no new test needed; we will request records from [facility].  As far as medications are concerned, I recommended the following at this time: no change.  I answered all their questions today and the patient and her husband were in agreement with the above outlined plan. I would like to see the patient back in 6 months, sooner if the need arises and encouraged them to call with any interim questions, concerns, problems or updates and refill requests.

## 2013-07-30 NOTE — Patient Instructions (Signed)
I think your Parkinson's disease has remained fairly stable, which is reassuring. Nevertheless, as you know, this disease does progress with time. It can affect your balance, your memory, your mood, your bowel and bladder function, your posture, balance and walking. Overall you are doing fairly well but I do want to suggest a few things today:  Remember to drink plenty of fluid, eat healthy meals and do not skip any meals. Try to eat protein with a every meal and eat a healthy snack such as fruit or nuts in between meals. Try to keep a regular sleep-wake schedule and try to exercise daily, particularly in the form of walking, 20-30 minutes a day, if you can.   Taking your medication on schedule is key.   Try to stay active physically and mentally. Engage in social activities in your community and with your family and try to keep up with current events by reading the newspaper or watching the news. Try to do word puzzles and you may like to do word puzzles and brain games on the computer such as on [email or web address]   As far as your medications are concerned, I would like to suggest that you take your current medication with the following additional changes:     As far as diagnostic testing, I will order: no new test, but we will request results from [facility].   I would like to see you back in 6 months, sooner if we need to. Please call us with any interim questions, concerns, problems, updates or refill requests.  Our phone number is [phone]. We also have an after hours call service for urgent matters and there is a physician on-call for urgent questions, that cannot wait till the next work day. For any emergencies you know to call 911 or go to the nearest emergency room.

## 2013-08-14 ENCOUNTER — Encounter: Payer: Self-pay | Admitting: Sports Medicine

## 2013-08-14 ENCOUNTER — Ambulatory Visit (INDEPENDENT_AMBULATORY_CARE_PROVIDER_SITE_OTHER): Payer: BC Managed Care – PPO | Admitting: Sports Medicine

## 2013-08-14 VITALS — BP 130/83 | HR 86 | Ht 63.0 in | Wt 130.0 lb

## 2013-08-14 DIAGNOSIS — M1710 Unilateral primary osteoarthritis, unspecified knee: Secondary | ICD-10-CM

## 2013-08-14 DIAGNOSIS — IMO0002 Reserved for concepts with insufficient information to code with codable children: Secondary | ICD-10-CM

## 2013-08-14 NOTE — Assessment & Plan Note (Addendum)
-  Effusion present today. Most likely related to stage IV DJD. -Plan to refer to Dr. Mayer Camel at [facility] for further investigation and consideration if he feels she may benefit and/or is a candidate for knee replacement at this point. -An appointment was arranged for the patient today -Plan follow-up pending outcome of Ortho Referral, Thurs. July 30 at 11:15am.

## 2013-08-14 NOTE — Patient Instructions (Signed)
Appt. Thurs. July 30 at 11:15am [facility] [address]  [place], [place] See Dr. Mayer Camel for knee pain

## 2013-08-14 NOTE — Progress Notes (Signed)
Subjective:    Patient ID: Michelle Sims, female    DOB: 1930-01-12, 80 y.o.   MRN: [id number]  HPI Michelle Sims is an 80 year old female who presents for follow-up of right knee pain. 4-5 days ago she noticed increasing swelling and pain in the right knee. This has been occurring intermittently over the past several months, but was more severe most recently. She denies any known mechanism of injury, falls, or pivot shift. Her symptoms are aggravated with standing or sitting movements. She has tried [place] injection, most recently on 07/18/13 which did not provide any relief. She has tried corticosteroid injection in the past, however this has lead to a steroid flare reaction. She has known stage IV degenerative joint disease of the right knee. She is frustrated that she is such a difficult time standing if she is sitting in a movie theater and with her intolerance with walking any prolonged distance due to right knee pain. She is still active in water aerobics but would like to be more active. She is taking calcium with vitamin D, reloxifene, and tramadol.   Past medical, social, medications, allergies were reviewed by me today.  Past Medical History  Diagnosis Date  . Hypertension   . Blood type, Rh negative   . Thyroid cancer   . Breast cancer   . Endocervical polyp   . Parkinson disease   . Osteopenia 05/2013    T score -1.5  . Insomnia   . Anxiety   . IBS (irritable bowel syndrome)   . GERD (gastroesophageal reflux disease)   . Diverticulosis   . Lymphedema   . Chronic fatigue      Medication List       This list is accurate as of: 08/14/13 11:18 AM.  Always use your most recent med list.               amLODipine 5 MG tablet  Commonly known as:  NORVASC  Take 5 mg by mouth daily.     CALCIUM + D PO  Take by mouth.     carbidopa-levodopa 25-100 MG per tablet  Commonly known as:  SINEMET IR  Take 1 tablet by mouth 3 (three) times daily.     clonazePAM 0.5 MG  tablet  Commonly known as:  KLONOPIN  Take 0.25 mg by mouth at bedtime.     DULoxetine 30 MG capsule  Commonly known as:  CYMBALTA  Take 1 capsule by mouth daily.     fish oil-omega-3 fatty acids 1000 MG capsule  Take 2 g by mouth daily.     irbesartan 75 MG tablet  Commonly known as:  AVAPRO  Take 75 mg by mouth daily.     LEVOTHYROXINE SODIUM PO  Take 112 mg by mouth daily.     methenamine 1 G tablet  Commonly known as:  HIPREX  Take 1 g by mouth daily.     NONFORMULARY OR COMPOUNDED ITEM  Estradiol 0.02% vaginal cream. Prefilled applicators.  S:  Insert vaginally three times weekly.     raloxifene 60 MG tablet  Commonly known as:  EVISTA  Take 1 tablet (60 mg total) by mouth daily.     traMADol 50 MG tablet  Commonly known as:  ULTRAM  Take 1 tablet (50 mg total) by mouth 2 (two) times daily.     VITAMIN C PO  Take 1 tablet by mouth daily.       Review of Systems Pertinent positives: Right knee  pain, swelling, cracking Pertinent negatives: Fever, chills, erythema, induration   11 point review of systems as performed by me today and is otherwise negative. Objective:   Physical Exam BP 130/83  Pulse 86  Ht 5\' 3"  (1.6 m)  Wt 130 lb (58.968 kg)  BMI 23.03 kg/m2 GEN: The patient is well-developed well-nourished female and in no acute distress.  She is awake alert and oriented x3. SKIN: warm and well-perfused, no rash, intact EXTR: No lower extremity edema or calf tenderness Neuro: Good strength is demonstrated in quadriceps, hamstrings, tibialis anterior, gastrocsoleus, and EHL. Normal sensation to light touch throughout the dorsal and plantar aspect of the foot. Vasc: +2 bilateral distal pulses. No edema.  MSK: Examination of the right knee with range of motion to 90 of flexion and extension to 5. Left knee with flexion to 135 and extension to 0 with mild pain without crepitus. Right knee with visible knee effusion. No overlying erythema or warmth. Diffusely  mildly tender to palpation anteromedially and posteriorly. Negative Lachman's bilaterally. Negative McMurray's. Negative patellar grind test. Right quadriceps with good tone.    Assessment & Plan:  Please see problem based assessment and plan in the problem list.

## 2013-08-15 ENCOUNTER — Ambulatory Visit: Payer: BC Managed Care – PPO | Admitting: Sports Medicine

## 2013-09-03 ENCOUNTER — Other Ambulatory Visit: Payer: Self-pay | Admitting: Orthopedic Surgery

## 2013-09-04 NOTE — Addendum Note (Signed)
Addended by: Kerin Salen on: 09/04/2013 09:32 AM   Modules accepted: Orders

## 2013-09-05 ENCOUNTER — Other Ambulatory Visit: Payer: Self-pay | Admitting: Gynecology

## 2013-09-05 NOTE — Telephone Encounter (Signed)
Has yearly exam scheduled in August.

## 2013-09-10 ENCOUNTER — Ambulatory Visit (INDEPENDENT_AMBULATORY_CARE_PROVIDER_SITE_OTHER): Payer: BC Managed Care – PPO | Admitting: Gynecology

## 2013-09-10 ENCOUNTER — Encounter: Payer: Self-pay | Admitting: Gynecology

## 2013-09-10 VITALS — BP 120/76 | Ht 63.0 in | Wt 139.0 lb

## 2013-09-10 DIAGNOSIS — M949 Disorder of cartilage, unspecified: Secondary | ICD-10-CM

## 2013-09-10 DIAGNOSIS — M8580 Other specified disorders of bone density and structure, unspecified site: Secondary | ICD-10-CM

## 2013-09-10 DIAGNOSIS — N952 Postmenopausal atrophic vaginitis: Secondary | ICD-10-CM

## 2013-09-10 DIAGNOSIS — M899 Disorder of bone, unspecified: Secondary | ICD-10-CM

## 2013-09-10 DIAGNOSIS — N816 Rectocele: Secondary | ICD-10-CM

## 2013-09-10 NOTE — Progress Notes (Signed)
Michelle Sims July 21, 1929 [id number]        80 y.o.  [id number] for followup exam. Several issues noted below.  Past medical history,surgical history, problem list, medications, allergies, family history and social history were all reviewed and documented as reviewed in the EPIC chart.  ROS:  12 system ROS performed with pertinent positives and negatives included in the history, assessment and plan.   Additional significant findings :  None   Exam: Kim Counsellor Vitals:   09/10/13 1411  BP: 120/76  Height: 5\' 3"  (1.6 m)  Weight: 139 lb (63.05 kg)   General appearance:  Normal affect, orientation and appearance. Skin: Grossly normal HEENT: Without gross lesions.  No cervical or supraclavicular adenopathy. Thyroid normal.  Lungs:  Clear without wheezing, rales or rhonchi Cardiac: RR, without RMG Abdominal:  Soft, nontender, without masses, guarding, rebound, organomegaly or hernia Breasts:  Examined lying and sitting without masses, retractions, discharge or axillary adenopathy. Pelvic:  Ext/BUS/vagina with generalized atrophic changes. First to second-degree rectocele.  Cervix flush with the upper vagina difficult to visualize  Uterus not clearly palpated. No gross masses or tenderness Adnexa  Without masses or tenderness    Anus and perineum  Normal   Rectovaginal  Normal sphincter tone without palpated masses or tenderness.    Assessment/Plan:  80 y.o. [id number] female for followup exam.   1. Significant vaginal atrophy. Patient using estradiol 0.02% vaginal cream twice weekly. We have previously discussed and I again reviewed today the risks of absorption particularly in a breast cancer survivor. Possible stimulation of metastatic disease reviewed. Patient feels this is a quality of life issue and she definitely feels much better using this and understands and accepts the risks. She does have a supply at home and will call when she needs more. 2. Osteopenia. DEXA 05/2013 T  score -1.5. Was -1.3 2013. Currently on Evista. Did show some decline at measured sites. At this point we'll continue expectantly with Evista and repeat at 2 year interval. Increase calcium and vitamin D recommendations reviewed. 3. Rectocele. Patient has a stable first to second degree rectocele. She is asymptomatic and we'll continue to monitor. 4. Pap smear 2013. No Pap smear done today. No history of significant abnormal Pap smears previously. Reviewed current screening guidelines and she is comfortable with stopped screening at her age. 5. Breast cancer. Exam NED. Mammography 05/2013. Continue with annual mammography. SBE monthly reviewed. 6. Colonoscopy 2010. Repeat at their recommended interval. 7. Health maintenance. No routine blood work done as she has this done at her primary physician's office. Followup one year, sooner as needed.   Note: This document was prepared with digital dictation and possible smart phrase technology. Any transcriptional errors that result from this process are unintentional.   [name] MD, 2:32 PM 09/10/2013

## 2013-09-10 NOTE — Patient Instructions (Signed)
Followup in one year for annual exam, sooner if any issues.  You may obtain a copy of any labs that were done today by logging onto MyChart as outlined in the instructions provided with your AVS (after visit summary). The office will not call with normal lab results but certainly if there are any significant abnormalities then we will contact you.   Health Maintenance, Female A healthy lifestyle and preventative care can promote health and wellness.  Maintain regular health, dental, and eye exams.  Eat a healthy diet. Foods like vegetables, fruits, whole grains, low-fat dairy products, and lean protein foods contain the nutrients you need without too many calories. Decrease your intake of foods high in solid fats, added sugars, and salt. Get information about a proper diet from your caregiver, if necessary.  Regular physical exercise is one of the most important things you can do for your health. Most adults should get at least 150 minutes of moderate-intensity exercise (any activity that increases your heart rate and causes you to sweat) each week. In addition, most adults need muscle-strengthening exercises on 2 or more days a week.   Maintain a healthy weight. The body mass index (BMI) is a screening tool to identify possible weight problems. It provides an estimate of body fat based on height and weight. Your caregiver can help determine your BMI, and can help you achieve or maintain a healthy weight. For adults 20 years and older:  A BMI below 18.5 is considered underweight.  A BMI of 18.5 to 24.9 is normal.  A BMI of 25 to 29.9 is considered overweight.  A BMI of 30 and above is considered obese.  Maintain normal blood lipids and cholesterol by exercising and minimizing your intake of saturated fat. Eat a balanced diet with plenty of fruits and vegetables. Blood tests for lipids and cholesterol should begin at age 20 and be repeated every 5 years. If your lipid or cholesterol levels are  high, you are over 50, or you are a high risk for heart disease, you may need your cholesterol levels checked more frequently.Ongoing high lipid and cholesterol levels should be treated with medicines if diet and exercise are not effective.  If you smoke, find out from your caregiver how to quit. If you do not use tobacco, do not start.  Lung cancer screening is recommended for adults aged 55 80 years who are at high risk for developing lung cancer because of a history of smoking. Yearly low-dose computed tomography (CT) is recommended for people who have at least a 30-pack-year history of smoking and are a current smoker or have quit within the past 15 years. A pack year of smoking is smoking an average of 1 pack of cigarettes a day for 1 year (for example: 1 pack a day for 30 years or 2 packs a day for 15 years). Yearly screening should continue until the smoker has stopped smoking for at least 15 years. Yearly screening should also be stopped for people who develop a health problem that would prevent them from having lung cancer treatment.  If you are pregnant, do not drink alcohol. If you are breastfeeding, be very cautious about drinking alcohol. If you are not pregnant and choose to drink alcohol, do not exceed 1 drink per day. One drink is considered to be 12 ounces (355 mL) of beer, 5 ounces (148 mL) of wine, or 1.5 ounces (44 mL) of liquor.  Avoid use of street drugs. Do not share needles with   anyone. Ask for help if you need support or instructions about stopping the use of drugs.  High blood pressure causes heart disease and increases the risk of stroke. Blood pressure should be checked at least every 1 to 2 years. Ongoing high blood pressure should be treated with medicines, if weight loss and exercise are not effective.  If you are 55 to 79 years old, ask your caregiver if you should take aspirin to prevent strokes.  Diabetes screening involves taking a blood sample to check your fasting  blood sugar level. This should be done once every 3 years, after age 45, if you are within normal weight and without risk factors for diabetes. Testing should be considered at a younger age or be carried out more frequently if you are overweight and have at least 1 risk factor for diabetes.  Breast cancer screening is essential preventative care for women. You should practice "breast self-awareness." This means understanding the normal appearance and feel of your breasts and may include breast self-examination. Any changes detected, no matter how small, should be reported to a caregiver. Women in their 20s and 30s should have a clinical breast exam (CBE) by a caregiver as part of a regular health exam every 1 to 3 years. After age 40, women should have a CBE every year. Starting at age 40, women should consider having a mammogram (breast X-ray) every year. Women who have a family history of breast cancer should talk to their caregiver about genetic screening. Women at a high risk of breast cancer should talk to their caregiver about having an MRI and a mammogram every year.  Breast cancer gene (BRCA)-related cancer risk assessment is recommended for women who have family members with BRCA-related cancers. BRCA-related cancers include breast, ovarian, tubal, and peritoneal cancers. Having family members with these cancers may be associated with an increased risk for harmful changes (mutations) in the breast cancer genes BRCA1 and BRCA2. Results of the assessment will determine the need for genetic counseling and BRCA1 and BRCA2 testing.  The Pap test is a screening test for cervical cancer. Women should have a Pap test starting at age 21. Between ages 21 and 29, Pap tests should be repeated every 2 years. Beginning at age 30, you should have a Pap test every 3 years as long as the past 3 Pap tests have been normal. If you had a hysterectomy for a problem that was not cancer or a condition that could lead to  cancer, then you no longer need Pap tests. If you are between ages 65 and 70, and you have had normal Pap tests going back 10 years, you no longer need Pap tests. If you have had past treatment for cervical cancer or a condition that could lead to cancer, you need Pap tests and screening for cancer for at least 20 years after your treatment. If Pap tests have been discontinued, risk factors (such as a new sexual partner) need to be reassessed to determine if screening should be resumed. Some women have medical problems that increase the chance of getting cervical cancer. In these cases, your caregiver may recommend more frequent screening and Pap tests.  The human papillomavirus (HPV) test is an additional test that may be used for cervical cancer screening. The HPV test looks for the virus that can cause the cell changes on the cervix. The cells collected during the Pap test can be tested for HPV. The HPV test could be used to screen women aged 30   years and older, and should be used in women of any age who have unclear Pap test results. After the age of 30, women should have HPV testing at the same frequency as a Pap test.  Colorectal cancer can be detected and often prevented. Most routine colorectal cancer screening begins at the age of 50 and continues through age 75. However, your caregiver may recommend screening at an earlier age if you have risk factors for colon cancer. On a yearly basis, your caregiver may provide home test kits to check for hidden blood in the stool. Use of a small camera at the end of a tube, to directly examine the colon (sigmoidoscopy or colonoscopy), can detect the earliest forms of colorectal cancer. Talk to your caregiver about this at age 50, when routine screening begins. Direct examination of the colon should be repeated every 5 to 10 years through age 75, unless early forms of pre-cancerous polyps or small growths are found.  Hepatitis C blood testing is recommended for  all people born from 1945 through 1965 and any individual with known risks for hepatitis C.  Practice safe sex. Use condoms and avoid high-risk sexual practices to reduce the spread of sexually transmitted infections (STIs). Sexually active women aged 25 and younger should be checked for Chlamydia, which is a common sexually transmitted infection. Older women with new or multiple partners should also be tested for Chlamydia. Testing for other STIs is recommended if you are sexually active and at increased risk.  Osteoporosis is a disease in which the bones lose minerals and strength with aging. This can result in serious bone fractures. The risk of osteoporosis can be identified using a bone density scan. Women ages 65 and over and women at risk for fractures or osteoporosis should discuss screening with their caregivers. Ask your caregiver whether you should be taking a calcium supplement or vitamin D to reduce the rate of osteoporosis.  Menopause can be associated with physical symptoms and risks. Hormone replacement therapy is available to decrease symptoms and risks. You should talk to your caregiver about whether hormone replacement therapy is right for you.  Use sunscreen. Apply sunscreen liberally and repeatedly throughout the day. You should seek shade when your shadow is shorter than you. Protect yourself by wearing long sleeves, pants, a wide-brimmed hat, and sunglasses year round, whenever you are outdoors.  Notify your caregiver of new moles or changes in moles, especially if there is a change in shape or color. Also notify your caregiver if a mole is larger than the size of a pencil eraser.  Stay current with your immunizations. Document Released: 07/26/2010 Document Revised: 05/07/2012 Document Reviewed: 07/26/2010 [employer] Patient Information 2014 [employer].   

## 2013-09-25 ENCOUNTER — Encounter (HOSPITAL_COMMUNITY): Payer: Self-pay | Admitting: Pharmacy Technician

## 2013-09-27 ENCOUNTER — Encounter (HOSPITAL_COMMUNITY): Payer: Self-pay

## 2013-09-27 ENCOUNTER — Encounter (HOSPITAL_COMMUNITY)
Admission: RE | Admit: 2013-09-27 | Discharge: 2013-09-27 | Disposition: A | Discharge status: Home / Self Care | Payer: BC Managed Care – PPO | Source: Ambulatory Visit | Attending: Orthopedic Surgery | Admitting: Orthopedic Surgery

## 2013-09-27 DIAGNOSIS — Z01818 Encounter for other preprocedural examination: Secondary | ICD-10-CM | POA: Insufficient documentation

## 2013-09-27 DIAGNOSIS — M1710 Unilateral primary osteoarthritis, unspecified knee: Secondary | ICD-10-CM | POA: Insufficient documentation

## 2013-09-27 HISTORY — DX: Hypothyroidism, unspecified: E03.9

## 2013-09-27 HISTORY — DX: Unspecified osteoarthritis, unspecified site: M19.90

## 2013-09-27 HISTORY — DX: Myoneural disorder, unspecified: G70.9

## 2013-09-27 HISTORY — DX: Depression, unspecified: F32.A

## 2013-09-27 HISTORY — DX: Major depressive disorder, single episode, unspecified: F32.9

## 2013-09-27 LAB — URINALYSIS, ROUTINE W REFLEX MICROSCOPIC
Bilirubin Urine: NEGATIVE
Glucose, UA: NEGATIVE mg/dL
HGB URINE DIPSTICK: NEGATIVE
KETONES UR: NEGATIVE mg/dL
Leukocytes, UA: NEGATIVE
Nitrite: NEGATIVE
Protein, ur: NEGATIVE mg/dL
SPECIFIC GRAVITY, URINE: 1.013 (ref 1.005–1.030)
UROBILINOGEN UA: 0.2 mg/dL (ref 0.0–1.0)
pH: 7.5 (ref 5.0–8.0)

## 2013-09-27 LAB — SURGICAL PCR SCREEN
MRSA, PCR: NEGATIVE
STAPHYLOCOCCUS AUREUS: NEGATIVE

## 2013-09-27 LAB — CBC WITH DIFFERENTIAL/PLATELET
Basophils Absolute: 0 10*3/uL (ref 0.0–0.1)
Basophils Relative: 0 % (ref 0–1)
EOS PCT: 1 % (ref 0–5)
Eosinophils Absolute: 0.1 10*3/uL (ref 0.0–0.7)
HCT: 40.2 % (ref 36.0–46.0)
Hemoglobin: 13.4 g/dL (ref 12.0–15.0)
LYMPHS PCT: 24 % (ref 12–46)
Lymphs Abs: 1.5 10*3/uL (ref 0.7–4.0)
MCH: 31 pg (ref 26.0–34.0)
MCHC: 33.3 g/dL (ref 30.0–36.0)
MCV: 93.1 fL (ref 78.0–100.0)
Monocytes Absolute: 0.3 10*3/uL (ref 0.1–1.0)
Monocytes Relative: 4 % (ref 3–12)
NEUTROS ABS: 4.4 10*3/uL (ref 1.7–7.7)
Neutrophils Relative %: 71 % (ref 43–77)
PLATELETS: 231 10*3/uL (ref 150–400)
RBC: 4.32 MIL/uL (ref 3.87–5.11)
RDW: 13.1 % (ref 11.5–15.5)
WBC: 6.3 10*3/uL (ref 4.0–10.5)

## 2013-09-27 LAB — BASIC METABOLIC PANEL
Anion gap: 13 (ref 5–15)
BUN: 20 mg/dL (ref 6–23)
CALCIUM: 9.3 mg/dL (ref 8.4–10.5)
CO2: 26 mEq/L (ref 19–32)
Chloride: 98 mEq/L (ref 96–112)
Creatinine, Ser: 0.61 mg/dL (ref 0.50–1.10)
GFR, EST NON AFRICAN AMERICAN: 82 mL/min — AB (ref >90)
Glucose, Bld: 87 mg/dL (ref 70–99)
POTASSIUM: 3.9 meq/L (ref 3.7–5.3)
SODIUM: 137 meq/L (ref 137–147)

## 2013-09-27 LAB — TYPE AND SCREEN
ABO/RH(D): B NEG
ANTIBODY SCREEN: NEGATIVE

## 2013-09-27 LAB — PROTIME-INR
INR: 0.98 (ref 0.00–1.49)
PROTHROMBIN TIME: 13 s (ref 11.6–15.2)

## 2013-09-27 LAB — APTT: aPTT: 29 seconds (ref 24–37)

## 2013-09-27 LAB — ABO/RH: ABO/RH(D): B NEG

## 2013-09-27 NOTE — Progress Notes (Signed)
>   3 yrs. Ago, saw Dr. Ashok Norris, & had ?ECHO, pt. Unsure; but also saw H. Smith for fluttering in her chest a couple of yrs. OK, Holter monitor normal, told that there was no need for followup. Last ekg- several yrs. Ago.

## 2013-09-27 NOTE — Pre-Procedure Instructions (Signed)
Michelle Sims  09/27/2013   Your procedure is scheduled on:  10/07/2013  Report to [facility] Admitting   ENTRANCE  A  at 7:45 AM.  Call this number if you have problems the morning of surgery: [phone]   Remember:   Do not eat food or drink liquids after midnight.  On Sunday evening    Take these medicines the morning of surgery with A SIP OF WATER: Sinemet, Synthyroid, Evista    Do not wear jewelry, make-up or nail polish.   Do not wear lotions, powders, or perfumes. You may wear deodorant.   Do not shave 48 hours prior to surgery.   Do not bring valuables to the hospital.  [facility] is not responsible                  for any belongings or valuables.               Contacts, dentures or bridgework may not be worn into surgery.   Leave suitcase in the car. After surgery it may be brought to your room.   For patients admitted to the hospital, discharge time is determined by your  treatment team.                Patients discharged the day of surgery will not be allowed to drive  home.  Name and phone number of your driver:  /w family  Special Instructions: Special Instructions:  - Preparing for Surgery  Before surgery, you can play an important role.  Because skin is not sterile, your skin needs to be as free of germs as possible.  You can reduce the number of germs on you skin by washing with CHG (chlorahexidine gluconate) soap before surgery.  CHG is an antiseptic cleaner which kills germs and bonds with the skin to continue killing germs even after washing.  Please DO NOT use if you have an allergy to CHG or antibacterial soaps.  If your skin becomes reddened/irritated stop using the CHG and inform your nurse when you arrive at Short Stay.  Do not shave (including legs and underarms) for at least 48 hours prior to the first CHG shower.  You may shave your face.  Please follow these instructions carefully:   1.  Shower with CHG Soap the night  before surgery and the  morning of Surgery.  2.  If you choose to wash your hair, wash your hair first as usual with your  normal shampoo.  3.  After you shampoo, rinse your hair and body thoroughly to remove the  Shampoo.  4.  Use CHG as you would any other liquid soap.  You can apply chg directly to the skin and wash gently with scrungie or a clean washcloth.  5.  Apply the CHG Soap to your body ONLY FROM THE NECK DOWN.    Do not use on open wounds or open sores.  Avoid contact with your eyes, ears, mouth and genitals (private parts).  Wash genitals (private parts)   with your normal soap.  6.  Wash thoroughly, paying special attention to the area where your surgery will be performed.  7.  Thoroughly rinse your body with warm water from the neck down.  8.  DO NOT shower/wash with your normal soap after using and rinsing off   the CHG Soap.  9.  Pat yourself dry with a clean towel.  10.  Wear clean pajamas.            11.  Place clean sheets on your bed the night of your first shower and do not sleep with pets.  Day of Surgery  Do not apply any lotions/deodorants the morning of surgery.  Please wear clean clothes to the hospital/surgery center.   Please read over the following fact sheets that you were given: Pain Booklet, Coughing and Deep Breathing, Blood Transfusion Information, MRSA Information and Surgical Site Infection Prevention

## 2013-09-27 NOTE — Progress Notes (Signed)
Call [facility] for most recent ekg & OV note. If avail, will rec. Then through fax at [phone], spoke with Mordecai Rasmussen

## 2013-10-02 ENCOUNTER — Other Ambulatory Visit: Payer: Self-pay | Admitting: Internal Medicine

## 2013-10-02 DIAGNOSIS — R1900 Intra-abdominal and pelvic swelling, mass and lump, unspecified site: Secondary | ICD-10-CM

## 2013-10-02 DIAGNOSIS — R1907 Generalized intra-abdominal and pelvic swelling, mass and lump: Secondary | ICD-10-CM

## 2013-10-02 NOTE — Progress Notes (Addendum)
Anesthesia Chart Review:  Pt is 80 year old female scheduled for R knee arthroplasty on 10/07/13 with Dr. Mayer Camel.  PMH: HTN, Parkinson's, Thyroid cancer, breast cancer, lymphedema  Medications include: Sinemet, methenamine, amlodipine, irbesartan, raloxifene, cymbalta, clonazepam  Preoperative labs reviewed.   Chest x-ray reviewed. No acute cardiopulmonary disease. Mild tortuosity of descending thoracic aorta.   Pt previously seen by Dr. Tamala Julian and Ms. Glo Herring, NP at [facility] in 2012 and 2013 for palpitations. Pt wore an event monitor in 2012 that showed NSR with PACs. Pt sx were mild in 2013 and she elected not to wear monitor again. Per Ms. Ferguson's notes in 2013, EKG at that time was NSR.   EKG:  Normal sinus rhythm Left axis deviation Right bundle branch block - New since previous tracing T wave abnormality, consider inferior ischemia  Discussed with Dr. Deatra Canter. Ischemia on EKG is new. Pt will need cardiac clearance prior to surgery. Juliann Pulse in Dr. Damita Dunnings office notified.   Willeen Cass, FNP-BC [facility] Short Stay Surgical Center/Anesthesiology Phone: [phone] 10/02/2013 3:49 PM  Addendum:  Cardiologist Dr. Tamala Julian reviewed EKGs and agreed that patient would need preoperative cardiology evaluation.  She has an appointment scheduled for 10/07/13 at [facility].  I spoke with Juliann Pulse at Dr. Damita Dunnings office.  They are planning to move patient to Dr. Damita Dunnings last case on 10/07/13 and having her remain NPO.  If by chance Dr. Tamala Julian clears patient then she would come over to [facility] for surgery later that day.  If there is need for additional testing preoperatively, then her surgery would need to be rescheduled. (Update 10/07/2013 9:28 AM: Patient cleared by Dr. Tamala Julian this morning.  See note in Epic.)  George Hugh [facility] Short Stay Center/Anesthesiology Phone [phone] 10/04/2013 11:53 AM

## 2013-10-03 ENCOUNTER — Telehealth: Payer: Self-pay | Admitting: Interventional Cardiology

## 2013-10-03 NOTE — Telephone Encounter (Signed)
New problem   Pt's spouse need to speak to nurse concerning pt having surgery on Monday and was told she had an abnormal echo. Please call pt's spouse

## 2013-10-03 NOTE — Telephone Encounter (Signed)
returned pt husband call. pt is scheduled to have R TKA with [facility]. pt has had changes in her EKG and they are requseting cardiac clearance form Dr.Smith.adv pt husband I will have Dr.Smith review present and past and call back with his recommendations. Pt husband agreeable and verbalized understanding.

## 2013-10-03 NOTE — Telephone Encounter (Signed)
returned pt husband call adv him that Dr.Smith is reviewing pt present and past Ekg's. Dr.Smith is also reviewing pt chart.I will call pt and pt husband back as soon as he determines if pt is cleared for upcoming knee Sx or if additional testing is req.pt husband agreeable and verbalized understanding.

## 2013-10-03 NOTE — H&P (Signed)
TOTAL KNEE ADMISSION H&P  Patient is being admitted for right total knee arthroplasty.  Subjective:  Chief Complaint:right knee pain.  HPI: Michelle Sims, 80 y.o. female, has a history of pain and functional disability in the right knee due to arthritis and has failed non-surgical conservative treatments for greater than 12 weeks to includeNSAID's and/or analgesics, corticosteriod injections, viscosupplementation injections, flexibility and strengthening excercises, use of assistive devices, weight reduction as appropriate and activity modification.  Onset of symptoms was gradual, starting several years ago with gradually worsening course since that time. The patient noted no past surgery on the right knee(s).  Patient currently rates pain in the right knee(s) at 10 out of 10 with activity. Patient has worsening of pain with activity and weight bearing, pain that interferes with activities of daily living, pain with passive range of motion, crepitus and joint swelling.  Patient has evidence of subchondral sclerosis, periarticular osteophytes and joint space narrowing by imaging studies. There is no active infection.  Patient Active Problem List   Diagnosis Date Noted  . Left shoulder pain 12/15/2011  . Postmenopause atrophic vaginitis 11/16/2011  . At high risk for falls 04/20/2011  . Hypertension   . Blood type, Rh negative   . Thyroid cancer   . Endocervical polyp   . NECK PAIN 02/17/2010  . SHOULDER PAIN, LEFT 12/03/2009  . Right hip pain 09/17/2009  . LUMBAGO 08/10/2009  . METATARSALGIA 05/28/2009  . DEGENERATIVE JOINT DISEASE, RIGHT KNEE 05/12/2009  . DEGENERATIVE DISC DISEASE, CERVICAL SPINE, W/RADICULOPATHY 04/30/2009  . BACK PAIN, THORACIC REGION, CHRONIC 04/30/2009  . DEPRESSION 11/14/2007  . HYPERTENSION 11/14/2007  . CELLULITIS 11/14/2007  . BREAST CANCER, HX OF 11/14/2007  . THYROID CANCER, HX OF 11/14/2007  . MASTECTOMY, RIGHT, HX OF 11/14/2007  . THYROIDECTOMY, HX OF  11/14/2007  . FATIGUE 11/13/2007   Past Medical History  Diagnosis Date  . Hypertension   . Blood type, Rh negative   . Endocervical polyp   . Parkinson disease   . Osteopenia 05/2013    T score -1.5  . Insomnia   . Anxiety   . IBS (irritable bowel syndrome)   . GERD (gastroesophageal reflux disease)   . Diverticulosis   . Lymphedema   . Chronic fatigue   . Hypothyroidism   . Depression   . Neuromuscular disorder     Parkinson's disease   . Arthritis     knee & back   . Thyroid cancer   . Breast cancer     Past Surgical History  Procedure Laterality Date  . Breast surgery  1979    MASTECTOMY AND RECONSTRUCTION  . Appendectomy  1979  . Tubal ligation    . Dilation and curettage of uterus      MISCARRIAGE  . Tonsillectomy    . Hysteroscopy  [id number]    AND D&C  . Thyroidectomy    . Mastectomy Bilateral   . Eye surgery Bilateral     w/ IOL    No prescriptions prior to admission   Allergies  Allergen Reactions  . Biaxin [Clarithromycin] Other (See Comments)    Pt does not remember reaction   . Ciprofloxacin Nausea And Vomiting  . Hctz [Hydrochlorothiazide] Other (See Comments)    weakness and hyponatremia  . Macrobid [Nitrofurantoin] Nausea Only  . Prednisone Other (See Comments)    Swelling and facial redness   . Sulfamethoxazole-Trimethoprim Other (See Comments)    Sore throat    History  Substance Use Topics  .  Smoking status: Never Smoker   . Smokeless tobacco: Never Used  . Alcohol Use: Yes     Comment: rare    Family History  Problem Relation Age of Onset  . Hypertension Mother   . Hyperlipidemia Mother   . Hypertension Father   . Hyperlipidemia Father   . Hyperlipidemia Brother   . Hypertension Brother   . Heart failure Mother   . Heart failure Father      Review of Systems  Constitutional: Positive for malaise/fatigue.  HENT: Negative.   Eyes: Negative.   Respiratory: Negative.   Cardiovascular: Negative.   Gastrointestinal:  Negative.   Genitourinary: Negative.   Musculoskeletal: Positive for joint pain.  Skin: Negative.   Neurological: Negative.   Endo/Heme/Allergies: Negative.   Psychiatric/Behavioral: Negative.     Objective:  Physical Exam  Constitutional: She is oriented to person, place, and time. She appears well-developed and well-nourished.  HENT:  Head: Normocephalic and atraumatic.  Eyes: Pupils are equal, round, and reactive to light.  Neck: Normal range of motion. Neck supple.  Cardiovascular: Intact distal pulses.   Respiratory: Effort normal.  Musculoskeletal: She exhibits tenderness.  Today, the patient has good strength bilaterally in her knees.  Patient has a range from approximately 0 to 115.  She does have obvious crepitance with range of motion.  Mild fullness.  She has tenderness with palpation over the lateral joint line.  Her calves are soft and nontender.  She is neurovascularly intact distally.  Neurological: She is alert and oriented to person, place, and time.  Skin: Skin is warm and dry.  Psychiatric: She has a normal mood and affect. Her behavior is normal. Judgment and thought content normal.    Vital signs in last 24 hours:    Labs:   Estimated body mass index is 23.03 kg/(m^2) as calculated from the following:   Height as of 08/14/13: 5\' 3"  (1.6 m).   Weight as of 08/14/13: 58.968 kg (130 lb).   Imaging Review Plain radiographs demonstratebilateral AP weightbearing, bilateral Rosenberg, lateral and sunrise views.  Patient has severe end-stage arthritis of the lateral compartment with collapse.  This includes dishing of the tibia.  Patient has moderate patellofemoral arthritis.  Assessment/Plan:  End stage arthritis, right knee   The patient history, physical examination, clinical judgment of the provider and imaging studies are consistent with end stage degenerative joint disease of the right knee(s) and total knee arthroplasty is deemed medically necessary.  The treatment options including medical management, injection therapy arthroscopy and arthroplasty were discussed at length. The risks and benefits of total knee arthroplasty were presented and reviewed. The risks due to aseptic loosening, infection, stiffness, patella tracking problems, thromboembolic complications and other imponderables were discussed. The patient acknowledged the explanation, agreed to proceed with the plan and consent was signed. Patient is being admitted for inpatient treatment for surgery, pain control, PT, OT, prophylactic antibiotics, VTE prophylaxis, progressive ambulation and ADL's and discharge planning. The patient is planning to be discharged to skilled nursing facility

## 2013-10-03 NOTE — Telephone Encounter (Signed)
Request for cardiac clearance by [facility] on Michelle Sims. Request is generated because of new EKG abnormalities. The EKG reveals new right bundle branch block and left anterior hemiblock. She will need to be seen in the office before I can clear her.

## 2013-10-04 NOTE — Telephone Encounter (Signed)
New message     Want you to know they have not cancelled her surgery for Monday---they will make her the last case.  Please call ASAP [phone] on Monday to let them know if she can still have her surgery.

## 2013-10-04 NOTE — Telephone Encounter (Signed)
called and  spoke with [facility] @ [place]. she is aware that pt will need to be seen by Dr.Smith in the office before cardiac clearance is approved. pt has appt on 9/14 @ 8am. pt Sx has been moved to 9/14 @12 :05pm pending approval. she rqst a callback on 9/14 as soon it is decided if the pt is cleared or not, adv her I will call her Monday morning with an update. She verbalized understanding

## 2013-10-04 NOTE — Progress Notes (Signed)
Nurse called patient and instructed her to arrive at 0905 instead of 0745. Patient verbalized understanding.

## 2013-10-04 NOTE — Telephone Encounter (Signed)
called to give [facility] an update on cardiac clearance rqst on hold over 10 min waiting for Dr.Rowans nurse. cannot hold anylonger will attempt to call their office again

## 2013-10-04 NOTE — Telephone Encounter (Signed)
pt and pt husband aware. Dr.Smith needs to see the pt in office before cardiac clearance can be granted.adv pt I will call and give [facility] the update. Pt verbalized understanding.

## 2013-10-06 MED ORDER — CHLORHEXIDINE GLUCONATE 4 % EX LIQD
60.0000 mL | Freq: Once | CUTANEOUS | Status: DC
  Filled 2013-10-06: qty 60

## 2013-10-06 MED ORDER — DEXTROSE-NACL 5-0.45 % IV SOLN: INTRAVENOUS | Status: DC

## 2013-10-06 MED ORDER — CEFAZOLIN SODIUM-DEXTROSE 2-3 GM-% IV SOLR
2.0000 g | INTRAVENOUS | Status: AC
  Administered 2013-10-07: 2 g via INTRAVENOUS
  Filled 2013-10-06: qty 50

## 2013-10-07 ENCOUNTER — Inpatient Hospital Stay (HOSPITAL_COMMUNITY)
Admission: RE | Admit: 2013-10-07 | Discharge: 2013-10-10 | DRG: 470 | Disposition: A | Discharge status: Skilled Nursing Facility | Payer: BC Managed Care – PPO | Source: Ambulatory Visit | Attending: Orthopedic Surgery | Admitting: Orthopedic Surgery

## 2013-10-07 ENCOUNTER — Encounter (HOSPITAL_COMMUNITY): Payer: Self-pay | Admitting: *Deleted

## 2013-10-07 ENCOUNTER — Ambulatory Visit (INDEPENDENT_AMBULATORY_CARE_PROVIDER_SITE_OTHER): Payer: BC Managed Care – PPO | Admitting: Interventional Cardiology

## 2013-10-07 ENCOUNTER — Inpatient Hospital Stay (HOSPITAL_COMMUNITY): Payer: BC Managed Care – PPO | Admitting: Anesthesiology

## 2013-10-07 ENCOUNTER — Encounter (HOSPITAL_COMMUNITY): Payer: BC Managed Care – PPO | Admitting: Emergency Medicine

## 2013-10-07 ENCOUNTER — Encounter (HOSPITAL_COMMUNITY)
Admission: RE | Disposition: A | Discharge status: Skilled Nursing Facility | Payer: Self-pay | Source: Ambulatory Visit | Attending: Orthopedic Surgery

## 2013-10-07 ENCOUNTER — Encounter: Payer: Self-pay | Admitting: Interventional Cardiology

## 2013-10-07 VITALS — BP 100/64 | HR 79 | Ht 63.0 in | Wt 135.8 lb

## 2013-10-07 DIAGNOSIS — D62 Acute posthemorrhagic anemia: Secondary | ICD-10-CM | POA: Diagnosis not present

## 2013-10-07 DIAGNOSIS — E039 Hypothyroidism, unspecified: Secondary | ICD-10-CM | POA: Diagnosis present

## 2013-10-07 DIAGNOSIS — F329 Major depressive disorder, single episode, unspecified: Secondary | ICD-10-CM | POA: Diagnosis present

## 2013-10-07 DIAGNOSIS — G20 Parkinson's disease: Secondary | ICD-10-CM | POA: Diagnosis present

## 2013-10-07 DIAGNOSIS — I10 Essential (primary) hypertension: Secondary | ICD-10-CM

## 2013-10-07 DIAGNOSIS — K589 Irritable bowel syndrome without diarrhea: Secondary | ICD-10-CM | POA: Diagnosis present

## 2013-10-07 DIAGNOSIS — M949 Disorder of cartilage, unspecified: Secondary | ICD-10-CM | POA: Diagnosis present

## 2013-10-07 DIAGNOSIS — IMO0002 Reserved for concepts with insufficient information to code with codable children: Secondary | ICD-10-CM

## 2013-10-07 DIAGNOSIS — F3289 Other specified depressive episodes: Secondary | ICD-10-CM

## 2013-10-07 DIAGNOSIS — M899 Disorder of bone, unspecified: Secondary | ICD-10-CM | POA: Diagnosis present

## 2013-10-07 DIAGNOSIS — M25569 Pain in unspecified knee: Secondary | ICD-10-CM | POA: Diagnosis present

## 2013-10-07 DIAGNOSIS — Z01810 Encounter for preprocedural cardiovascular examination: Secondary | ICD-10-CM

## 2013-10-07 DIAGNOSIS — G20A1 Parkinson's disease without dyskinesia, without mention of fluctuations: Secondary | ICD-10-CM | POA: Diagnosis present

## 2013-10-07 DIAGNOSIS — K219 Gastro-esophageal reflux disease without esophagitis: Secondary | ICD-10-CM | POA: Diagnosis present

## 2013-10-07 DIAGNOSIS — F411 Generalized anxiety disorder: Secondary | ICD-10-CM | POA: Diagnosis present

## 2013-10-07 DIAGNOSIS — Z853 Personal history of malignant neoplasm of breast: Secondary | ICD-10-CM

## 2013-10-07 DIAGNOSIS — M1710 Unilateral primary osteoarthritis, unspecified knee: Secondary | ICD-10-CM | POA: Diagnosis present

## 2013-10-07 DIAGNOSIS — I4510 Unspecified right bundle-branch block: Secondary | ICD-10-CM

## 2013-10-07 DIAGNOSIS — Z85850 Personal history of malignant neoplasm of thyroid: Secondary | ICD-10-CM

## 2013-10-07 HISTORY — PX: TOTAL KNEE ARTHROPLASTY: SHX125

## 2013-10-07 HISTORY — DX: Unspecified right bundle-branch block: I45.10

## 2013-10-07 SURGERY — ARTHROPLASTY, KNEE, TOTAL
Anesthesia: General | Site: Knee | Laterality: Right

## 2013-10-07 MED ORDER — FENTANYL CITRATE 0.05 MG/ML IJ SOLN
75.0000 ug | Freq: Once | INTRAMUSCULAR | Status: AC
  Administered 2013-10-07: 75 ug via INTRAVENOUS

## 2013-10-07 MED ORDER — EPHEDRINE SULFATE 50 MG/ML IJ SOLN
INTRAMUSCULAR | Status: AC
Start: 2013-10-07 — End: 2013-10-07
  Filled 2013-10-07: qty 1

## 2013-10-07 MED ORDER — BUPIVACAINE-EPINEPHRINE (PF) 0.5% -1:200000 IJ SOLN
INTRAMUSCULAR | Status: DC | PRN
  Administered 2013-10-07: 20 mL via PERINEURAL

## 2013-10-07 MED ORDER — BISACODYL 5 MG PO TBEC
5.0000 mg | DELAYED_RELEASE_TABLET | Freq: Every day | ORAL | Status: DC | PRN
  Administered 2013-10-09: 5 mg via ORAL
  Filled 2013-10-07 (×2): qty 1

## 2013-10-07 MED ORDER — ONDANSETRON HCL 4 MG/2ML IJ SOLN
INTRAMUSCULAR | Status: DC | PRN
  Administered 2013-10-07: 4 mg via INTRAVENOUS

## 2013-10-07 MED ORDER — PROMETHAZINE HCL 25 MG/ML IJ SOLN: 6.2500 mg | INTRAMUSCULAR | Status: DC | PRN

## 2013-10-07 MED ORDER — SODIUM CHLORIDE 0.9 % IJ SOLN
INTRAMUSCULAR | Status: AC
  Filled 2013-10-07: qty 10

## 2013-10-07 MED ORDER — MEPERIDINE HCL 25 MG/ML IJ SOLN: 6.2500 mg | INTRAMUSCULAR | Status: DC | PRN

## 2013-10-07 MED ORDER — CLONAZEPAM 0.5 MG PO TABS
0.2500 mg | ORAL_TABLET | Freq: Every day | ORAL | Status: DC
  Administered 2013-10-07 – 2013-10-09 (×3): 0.25 mg via ORAL
  Filled 2013-10-07 (×3): qty 1

## 2013-10-07 MED ORDER — SENNOSIDES-DOCUSATE SODIUM 8.6-50 MG PO TABS: 1.0000 | ORAL_TABLET | Freq: Every evening | ORAL | Status: DC | PRN

## 2013-10-07 MED ORDER — ASPIRIN EC 325 MG PO TBEC
325.0000 mg | DELAYED_RELEASE_TABLET | Freq: Every day | ORAL | Status: DC
  Administered 2013-10-08 – 2013-10-10 (×3): 325 mg via ORAL
  Filled 2013-10-07 (×4): qty 1

## 2013-10-07 MED ORDER — MENTHOL 3 MG MT LOZG: 1.0000 | LOZENGE | OROMUCOSAL | Status: DC | PRN

## 2013-10-07 MED ORDER — AMLODIPINE BESYLATE 5 MG PO TABS
5.0000 mg | ORAL_TABLET | Freq: Every day | ORAL | Status: DC
  Administered 2013-10-07 – 2013-10-09 (×3): 5 mg via ORAL
  Filled 2013-10-07 (×4): qty 1

## 2013-10-07 MED ORDER — METHOCARBAMOL 1000 MG/10ML IJ SOLN
500.0000 mg | Freq: Four times a day (QID) | INTRAVENOUS | Status: DC | PRN
  Administered 2013-10-07: 500 mg via INTRAVENOUS
  Filled 2013-10-07: qty 5

## 2013-10-07 MED ORDER — LEVOTHYROXINE SODIUM 112 MCG PO TABS
112.0000 ug | ORAL_TABLET | Freq: Every day | ORAL | Status: DC
  Administered 2013-10-08 – 2013-10-10 (×3): 112 ug via ORAL
  Filled 2013-10-07 (×4): qty 1

## 2013-10-07 MED ORDER — OXYCODONE-ACETAMINOPHEN 5-325 MG PO TABS: 1.0000 | ORAL_TABLET | ORAL | Status: DC | PRN

## 2013-10-07 MED ORDER — CEFUROXIME SODIUM 1.5 G IJ SOLR
INTRAMUSCULAR | Status: AC
  Filled 2013-10-07: qty 1.5

## 2013-10-07 MED ORDER — FENTANYL CITRATE 0.05 MG/ML IJ SOLN
INTRAMUSCULAR | Status: AC
  Administered 2013-10-07: 25 ug via INTRAVENOUS
  Filled 2013-10-07: qty 2

## 2013-10-07 MED ORDER — SODIUM CHLORIDE 0.9 % IR SOLN
Status: DC | PRN
  Administered 2013-10-07 (×2): 1000 mL

## 2013-10-07 MED ORDER — ONDANSETRON HCL 4 MG PO TABS: 4.0000 mg | ORAL_TABLET | Freq: Four times a day (QID) | ORAL | Status: DC | PRN

## 2013-10-07 MED ORDER — FENTANYL CITRATE 0.05 MG/ML IJ SOLN
INTRAMUSCULAR | Status: AC
  Administered 2013-10-07: 75 ug via INTRAVENOUS
  Filled 2013-10-07: qty 2

## 2013-10-07 MED ORDER — METOCLOPRAMIDE HCL 5 MG/ML IJ SOLN: 5.0000 mg | Freq: Three times a day (TID) | INTRAMUSCULAR | Status: DC | PRN

## 2013-10-07 MED ORDER — HYDROMORPHONE HCL PF 1 MG/ML IJ SOLN: 0.5000 mg | INTRAMUSCULAR | Status: DC | PRN

## 2013-10-07 MED ORDER — LIDOCAINE HCL (CARDIAC) 20 MG/ML IV SOLN
INTRAVENOUS | Status: AC
  Filled 2013-10-07: qty 5

## 2013-10-07 MED ORDER — IRBESARTAN 75 MG PO TABS
75.0000 mg | ORAL_TABLET | Freq: Every day | ORAL | Status: DC
  Administered 2013-10-07 – 2013-10-09 (×3): 75 mg via ORAL
  Filled 2013-10-07 (×4): qty 1

## 2013-10-07 MED ORDER — METHENAMINE MANDELATE 1 G PO TABS
1000.0000 mg | ORAL_TABLET | Freq: Every day | ORAL | Status: DC
  Administered 2013-10-08 – 2013-10-10 (×4): 1000 mg via ORAL
  Filled 2013-10-07 (×3): qty 1

## 2013-10-07 MED ORDER — FENTANYL CITRATE 0.05 MG/ML IJ SOLN
INTRAMUSCULAR | Status: DC | PRN
  Administered 2013-10-07: 50 ug via INTRAVENOUS
  Administered 2013-10-07: 100 ug via INTRAVENOUS

## 2013-10-07 MED ORDER — BUPIVACAINE LIPOSOME 1.3 % IJ SUSP
INTRAMUSCULAR | Status: DC | PRN
  Administered 2013-10-07: 20 mL

## 2013-10-07 MED ORDER — BUPIVACAINE LIPOSOME 1.3 % IJ SUSP
20.0000 mL | INTRAMUSCULAR | Status: DC
  Filled 2013-10-07: qty 20

## 2013-10-07 MED ORDER — METHOCARBAMOL 500 MG PO TABS
500.0000 mg | ORAL_TABLET | Freq: Four times a day (QID) | ORAL | Status: DC | PRN
  Administered 2013-10-07 – 2013-10-08 (×3): 500 mg via ORAL
  Filled 2013-10-07 (×4): qty 1

## 2013-10-07 MED ORDER — SODIUM CHLORIDE 0.9 % IJ SOLN
INTRAMUSCULAR | Status: DC | PRN
  Administered 2013-10-07: 40 mL

## 2013-10-07 MED ORDER — ACETAMINOPHEN 650 MG RE SUPP: 650.0000 mg | Freq: Four times a day (QID) | RECTAL | Status: DC | PRN

## 2013-10-07 MED ORDER — FENTANYL CITRATE 0.05 MG/ML IJ SOLN
INTRAMUSCULAR | Status: AC
  Filled 2013-10-07: qty 5

## 2013-10-07 MED ORDER — DULOXETINE HCL 30 MG PO CPEP
30.0000 mg | ORAL_CAPSULE | Freq: Every day | ORAL | Status: DC
  Administered 2013-10-08 – 2013-10-09 (×2): 30 mg via ORAL
  Filled 2013-10-07 (×4): qty 1

## 2013-10-07 MED ORDER — METOCLOPRAMIDE HCL 10 MG PO TABS: 5.0000 mg | ORAL_TABLET | Freq: Three times a day (TID) | ORAL | Status: DC | PRN

## 2013-10-07 MED ORDER — RALOXIFENE HCL 60 MG PO TABS
60.0000 mg | ORAL_TABLET | Freq: Every day | ORAL | Status: DC
  Administered 2013-10-08 – 2013-10-10 (×3): 60 mg via ORAL
  Filled 2013-10-07 (×4): qty 1

## 2013-10-07 MED ORDER — KCL IN DEXTROSE-NACL 20-5-0.45 MEQ/L-%-% IV SOLN
INTRAVENOUS | Status: DC
  Administered 2013-10-07 – 2013-10-08 (×2): via INTRAVENOUS
  Filled 2013-10-07 (×11): qty 1000

## 2013-10-07 MED ORDER — FLEET ENEMA 7-19 GM/118ML RE ENEM: 1.0000 | ENEMA | Freq: Once | RECTAL | Status: AC | PRN

## 2013-10-07 MED ORDER — MIDAZOLAM HCL 2 MG/2ML IJ SOLN
1.0000 mg | Freq: Once | INTRAMUSCULAR | Status: AC
  Administered 2013-10-07: 1 mg via INTRAVENOUS

## 2013-10-07 MED ORDER — ONDANSETRON HCL 4 MG/2ML IJ SOLN: 4.0000 mg | Freq: Four times a day (QID) | INTRAMUSCULAR | Status: DC | PRN

## 2013-10-07 MED ORDER — FENTANYL CITRATE 0.05 MG/ML IJ SOLN
25.0000 ug | INTRAMUSCULAR | Status: DC | PRN
  Administered 2013-10-07 (×4): 25 ug via INTRAVENOUS

## 2013-10-07 MED ORDER — LACTATED RINGERS IV SOLN
INTRAVENOUS | Status: DC
  Administered 2013-10-07: 10:00:00 via INTRAVENOUS

## 2013-10-07 MED ORDER — ASPIRIN EC 325 MG PO TBEC: 325.0000 mg | DELAYED_RELEASE_TABLET | Freq: Two times a day (BID) | ORAL | Status: DC

## 2013-10-07 MED ORDER — MIDAZOLAM HCL 2 MG/2ML IJ SOLN
INTRAMUSCULAR | Status: AC
  Administered 2013-10-07: 1 mg via INTRAVENOUS
  Filled 2013-10-07: qty 2

## 2013-10-07 MED ORDER — TIZANIDINE HCL 2 MG PO CAPS: 2.0000 mg | ORAL_CAPSULE | Freq: Three times a day (TID) | ORAL | Status: DC

## 2013-10-07 MED ORDER — PHENYLEPHRINE HCL 10 MG/ML IJ SOLN
INTRAMUSCULAR | Status: DC | PRN
  Administered 2013-10-07 (×2): 60 ug via INTRAVENOUS
  Administered 2013-10-07: 80 ug via INTRAVENOUS

## 2013-10-07 MED ORDER — LIDOCAINE HCL (CARDIAC) 20 MG/ML IV SOLN
INTRAVENOUS | Status: DC | PRN
  Administered 2013-10-07: 50 mg via INTRAVENOUS

## 2013-10-07 MED ORDER — PROPOFOL 10 MG/ML IV BOLUS
INTRAVENOUS | Status: AC
  Filled 2013-10-07: qty 20

## 2013-10-07 MED ORDER — ALUM & MAG HYDROXIDE-SIMETH 200-200-20 MG/5ML PO SUSP: 30.0000 mL | ORAL | Status: DC | PRN

## 2013-10-07 MED ORDER — PROPOFOL 10 MG/ML IV BOLUS
INTRAVENOUS | Status: DC | PRN
  Administered 2013-10-07: 100 mg via INTRAVENOUS

## 2013-10-07 MED ORDER — PHENOL 1.4 % MT LIQD: 1.0000 | OROMUCOSAL | Status: DC | PRN

## 2013-10-07 MED ORDER — ONDANSETRON HCL 4 MG/2ML IJ SOLN
INTRAMUSCULAR | Status: AC
  Filled 2013-10-07: qty 2

## 2013-10-07 MED ORDER — VITAMIN C 500 MG PO TABS
500.0000 mg | ORAL_TABLET | Freq: Every day | ORAL | Status: DC
  Administered 2013-10-07 – 2013-10-10 (×4): 500 mg via ORAL
  Filled 2013-10-07 (×4): qty 1

## 2013-10-07 MED ORDER — OXYCODONE HCL 5 MG PO TABS
5.0000 mg | ORAL_TABLET | ORAL | Status: DC | PRN
  Administered 2013-10-07: 5 mg via ORAL
  Administered 2013-10-08 (×2): 10 mg via ORAL
  Administered 2013-10-08: 5 mg via ORAL
  Filled 2013-10-07 (×2): qty 1
  Filled 2013-10-07 (×2): qty 2

## 2013-10-07 MED ORDER — DOCUSATE SODIUM 100 MG PO CAPS
100.0000 mg | ORAL_CAPSULE | Freq: Two times a day (BID) | ORAL | Status: DC
  Administered 2013-10-07 – 2013-10-10 (×6): 100 mg via ORAL
  Filled 2013-10-07 (×7): qty 1

## 2013-10-07 MED ORDER — CARBIDOPA-LEVODOPA 25-100 MG PO TABS
1.0000 | ORAL_TABLET | Freq: Three times a day (TID) | ORAL | Status: DC
  Administered 2013-10-07 – 2013-10-10 (×9): 1 via ORAL
  Filled 2013-10-07 (×11): qty 1

## 2013-10-07 MED ORDER — SODIUM CHLORIDE 0.9 % IV SOLN
1000.0000 mg | INTRAVENOUS | Status: AC
  Administered 2013-10-07: 1000 mg via INTRAVENOUS
  Filled 2013-10-07: qty 10

## 2013-10-07 MED ORDER — METHENAMINE HIPPURATE 1 G PO TABS
1.0000 g | ORAL_TABLET | Freq: Every day | ORAL | Status: DC
  Administered 2013-10-08: 1 g via ORAL
  Filled 2013-10-07 (×2): qty 1

## 2013-10-07 MED ORDER — DIPHENHYDRAMINE HCL 12.5 MG/5ML PO ELIX: 12.5000 mg | ORAL_SOLUTION | ORAL | Status: DC | PRN

## 2013-10-07 MED ORDER — ACETAMINOPHEN 325 MG PO TABS
650.0000 mg | ORAL_TABLET | Freq: Four times a day (QID) | ORAL | Status: DC | PRN
  Administered 2013-10-08: 650 mg via ORAL
  Filled 2013-10-07 (×2): qty 2

## 2013-10-07 MED ORDER — CEFUROXIME SODIUM 1.5 G IJ SOLR
INTRAMUSCULAR | Status: DC | PRN
  Administered 2013-10-07: 1.5 g

## 2013-10-07 SURGICAL SUPPLY — 63 items
BANDAGE ELASTIC 6 VELCRO ST LF (GAUZE/BANDAGES/DRESSINGS) ×2 IMPLANT
BANDAGE ESMARK 6X9 LF (GAUZE/BANDAGES/DRESSINGS) ×1 IMPLANT
BLADE SAG 18X100X1.27 (BLADE) ×2 IMPLANT
BLADE SAW SGTL 13X75X1.27 (BLADE) ×2 IMPLANT
BLADE SURG ROTATE 9660 (MISCELLANEOUS) IMPLANT
BNDG CMPR 9X6 STRL LF SNTH (GAUZE/BANDAGES/DRESSINGS) ×1
BNDG CMPR MED 10X6 ELC LF (GAUZE/BANDAGES/DRESSINGS) ×1
BNDG ELASTIC 6X10 VLCR STRL LF (GAUZE/BANDAGES/DRESSINGS) ×2 IMPLANT
BNDG ESMARK 6X9 LF (GAUZE/BANDAGES/DRESSINGS) ×2
BOWL SMART MIX CTS (DISPOSABLE) ×2 IMPLANT
CAPT RP KNEE ×2 IMPLANT
CAST PADDING SYN 3 (CAST SUPPLIES) ×2 IMPLANT
CEMENT HV SMART SET (Cement) ×4 IMPLANT
COVER SURGICAL LIGHT HANDLE (MISCELLANEOUS) ×2 IMPLANT
CUFF TOURNIQUET SINGLE 34IN LL (TOURNIQUET CUFF) IMPLANT
CUFF TOURNIQUET SINGLE 44IN (TOURNIQUET CUFF) IMPLANT
DRAPE EXTREMITY T 121X128X90 (DRAPE) ×2 IMPLANT
DRAPE U-SHAPE 47X51 STRL (DRAPES) ×2 IMPLANT
DURAPREP 26ML APPLICATOR (WOUND CARE) ×2 IMPLANT
ELECT REM PT RETURN 9FT ADLT (ELECTROSURGICAL) ×2
ELECTRODE REM PT RTRN 9FT ADLT (ELECTROSURGICAL) ×1 IMPLANT
EVACUATOR 1/8 PVC DRAIN (DRAIN) ×2 IMPLANT
GAUZE SPONGE 4X4 12PLY STRL (GAUZE/BANDAGES/DRESSINGS) ×4 IMPLANT
GAUZE VASELINE 1X8 (GAUZE/BANDAGES/DRESSINGS) ×2 IMPLANT
GAUZE XEROFORM 1X8 LF (GAUZE/BANDAGES/DRESSINGS) ×2 IMPLANT
GLOVE BIO SURGEON STRL SZ7.5 (GLOVE) ×2 IMPLANT
GLOVE BIO SURGEON STRL SZ8.5 (GLOVE) ×2 IMPLANT
GLOVE BIOGEL PI IND STRL 8 (GLOVE) ×1 IMPLANT
GLOVE BIOGEL PI IND STRL 9 (GLOVE) ×1 IMPLANT
GLOVE BIOGEL PI INDICATOR 8 (GLOVE) ×1
GLOVE BIOGEL PI INDICATOR 9 (GLOVE) ×1
GOWN STRL REUS W/ TWL LRG LVL3 (GOWN DISPOSABLE) ×2 IMPLANT
GOWN STRL REUS W/ TWL XL LVL3 (GOWN DISPOSABLE) ×2 IMPLANT
GOWN STRL REUS W/TWL LRG LVL3 (GOWN DISPOSABLE) ×4
GOWN STRL REUS W/TWL XL LVL3 (GOWN DISPOSABLE) ×4
HANDPIECE INTERPULSE COAX TIP (DISPOSABLE) ×2
HOOD PEEL AWAY FACE SHEILD DIS (HOOD) ×4 IMPLANT
KIT BASIN OR (CUSTOM PROCEDURE TRAY) ×2 IMPLANT
KIT ROOM TURNOVER OR (KITS) ×2 IMPLANT
MANIFOLD NEPTUNE II (INSTRUMENTS) ×2 IMPLANT
NDL SAFETY ECLIPSE 18X1.5 (NEEDLE) IMPLANT
NEEDLE 22X1 1/2 (OR ONLY) (NEEDLE) ×2 IMPLANT
NEEDLE HYPO 18GX1.5 SHARP (NEEDLE)
NEEDLE SPNL 18GX3.5 QUINCKE PK (NEEDLE) ×2 IMPLANT
NS IRRIG 1000ML POUR BTL (IV SOLUTION) IMPLANT
PACK TOTAL JOINT (CUSTOM PROCEDURE TRAY) ×2 IMPLANT
PAD ARMBOARD 7.5X6 YLW CONV (MISCELLANEOUS) ×4 IMPLANT
PADDING CAST COTTON 6X4 STRL (CAST SUPPLIES) ×2 IMPLANT
SET HNDPC FAN SPRY TIP SCT (DISPOSABLE) ×1 IMPLANT
SPONGE GAUZE 4X4 12PLY STER LF (GAUZE/BANDAGES/DRESSINGS) ×2 IMPLANT
STAPLER VISISTAT 35W (STAPLE) ×2 IMPLANT
SUCTION FRAZIER TIP 10 FR DISP (SUCTIONS) ×2 IMPLANT
SUT VIC AB 0 CTX 36 (SUTURE) ×2
SUT VIC AB 0 CTX36XBRD ANTBCTR (SUTURE) ×1 IMPLANT
SUT VIC AB 1 CTX 36 (SUTURE)
SUT VIC AB 1 CTX36XBRD ANBCTR (SUTURE) IMPLANT
SUT VIC AB 2-0 CT1 27 (SUTURE) ×2
SUT VIC AB 2-0 CT1 TAPERPNT 27 (SUTURE) ×1 IMPLANT
SYR 30ML LL (SYRINGE) IMPLANT
SYR 50ML LL SCALE MARK (SYRINGE) ×2 IMPLANT
TOWEL OR 17X24 6PK STRL BLUE (TOWEL DISPOSABLE) ×2 IMPLANT
TOWEL OR 17X26 10 PK STRL BLUE (TOWEL DISPOSABLE) ×2 IMPLANT
WATER STERILE IRR 1000ML POUR (IV SOLUTION) IMPLANT

## 2013-10-07 NOTE — Patient Instructions (Signed)
Your physician recommends that you continue on your current medications as directed. Please refer to the Current Medication list given to you today.  You are cleared for surgery  Your physician recommends that you schedule a follow-up appointment as needed

## 2013-10-07 NOTE — Anesthesia Procedure Notes (Signed)
Anesthesia Regional Block:  Adductor canal block  Pre-Anesthetic Checklist: ,, timeout performed, Correct Patient, Correct Site, Correct Laterality, Correct Procedure,, site marked, surgical consent,, at surgeon's request and post-op pain management  Laterality: Right and Lower  Prep: Maximum Sterile Barrier Precautions used and chloraprep       Needles:   Needle Type: Echogenic Stimulator Needle     Needle Length: 9cm 9 cm     Additional Needles:  Procedures: ultrasound guided (picture in chart) Adductor canal block Narrative:   Events: blood aspirated  Performed by: Personally  Anesthesiologist: Alaiya Martindelcampo  Additional Notes: R adductor canal block, versed and fentanyl 1 and 1.5cc for sedation.  Marcaine .5% 20cc ttoal.  Needle in two places.  Blood aspirated, then needle moved before injection.  Suspect venous.  Patient talking to me throughout.  Sterile technique, no complications.

## 2013-10-07 NOTE — Op Note (Signed)
PATIENT ID:      Michelle Sims  MRN:     [id number] DOB/AGE:    05-08-1929 / 80 y.o.       OPERATIVE REPORT    DATE OF PROCEDURE:  10/07/2013       PREOPERATIVE DIAGNOSIS:   OSTEOARTHRITIS RIGHT KNEE      Estimated body mass index is 23.92 kg/(m^2) as calculated from the following:   Height as of an earlier encounter on 10/07/13: 5\' 3"  (1.6 m).   Weight as of this encounter: 61.236 kg (135 lb).                                                        POSTOPERATIVE DIAGNOSIS:   OSTEOARTHRITIS RIGHT KNEE                                                                      PROCEDURE:  Procedure(s): RIGHT TOTAL KNEE ARTHROPLASTY Using Depuy Sigma RP implants #3R Femur, #4Tibia, 37mm Sigma RP bearing, 38 Patella     SURGEON: Admire Bunnell J    ASSISTANT:   [employer]   (Present and scrubbed throughout the case, critical for assistance with exposure, retraction, instrumentation, and closure.)         ANESTHESIA: GLMA, ACB, Exparel  DRAINS: 2 medium hemovac in knee   TOURNIQUET TIME: 75ZWC   COMPLICATIONS:  None     SPECIMENS: None   INDICATIONS FOR PROCEDURE: The patient has  OSTEOARTHRITIS RIGHT KNEE, valgus deformities, XR shows bone on bone arthritis. Patient has failed all conservative measures including anti-inflammatory medicines, narcotics, attempts at  exercise and weight loss, cortisone injections and viscosupplementation.  Risks and benefits of surgery have been discussed, questions answered.   DESCRIPTION OF PROCEDURE: The patient identified by armband, received  IV antibiotics, in the holding area at [facility]. Patient taken to the operating room, appropriate anesthetic  monitors were attached, and general endotracheal anesthesia induced with  the patient in supine position, Foley catheter was inserted. Tourniquet  applied high to the operative thigh. Lateral post and foot positioner  applied to the table, the lower extremity was then prepped and draped  in  usual sterile fashion from the ankle to the tourniquet. Time-out procedure was performed. The limb was wrapped with an Esmarch bandage and the tourniquet inflated to 300 mmHg. We began the operation by making the anterior midline incision starting at handbreadth above the patella going over the patella 1 cm medial to and  4 cm distal to the tibial tubercle. Small bleeders in the skin and the  subcutaneous tissue identified and cauterized. Transverse retinaculum was incised and reflected medially and a medial parapatellar arthrotomy was accomplished. the patella was everted and theprepatellar fat pad resected. The superficial medial collateral  ligament was then elevated from anterior to posterior along the proximal  flare of the tibia and anterior half of the menisci resected. The knee was hyperflexed exposing bone on bone arthritis. Peripheral and notch osteophytes as well as the cruciate ligaments were then resected. We continued to  work  our way around posteriorly along the proximal tibia, and externally  rotated the tibia subluxing it out from underneath the femur. A McHale  retractor was placed through the notch and a lateral Hohmann retractor  placed, and we then drilled through the proximal tibia in line with the  axis of the tibia followed by an intramedullary guide rod and 2-degree  posterior slope cutting guide. The tibial cutting guide was pinned into place  allowing resection of 8 mm of bone medially and about 0 mm of bone  laterally because of her varus deformity. Satisfied with the tibial resection, we then  entered the distal femur 2 mm anterior to the PCL origin with the  intramedullary guide rod and applied the distal femoral cutting guide  set at 44mm, with 5 degrees of valgus. This was pinned along the  epicondylar axis. At this point, the distal femoral cut was accomplished without difficulty. We then sized for a #3R femoral component and pinned the guide in 0 degrees of external  rotation.The chamfer cutting guide was pinned into place. The anterior, posterior, and chamfer cuts were accomplished without difficulty followed by  the box cutting guide and the box cut. We also removed posterior osteophytes from the posterior femoral condyles. At this  time, the knee was brought into full extension. We checked our  extension and flexion gaps and found them symmetric at 47mm.  The patella thickness measured at 25 mm. We set the cutting guide at 15 and removed the posterior 10 mm  of the patella, sized for a 38 button and drilled the lollipop. The knee  was then once again hyperflexed exposing the proximal tibia. We sized for a #4 tibial base plate, applied the smokestack and the conical reamer followed by the the Delta fin keel punch. We then hammered into place the Sigma RP trial femoral component, inserted a 10-mm trial bearing, trial patellar button, and took the knee through range of motion from 0-130 degrees. No thumb pressure was required for patellar  tracking. At this point, all trial components were removed, a double batch of DePuy HV cement with 1500 mg of Zinacef was mixed and applied to all bony metallic mating surfaces except for the posterior condyles of the femur itself. In order, we  hammered into place the tibial tray and removed excess cement, the femoral component and removed excess cement, a 10-mm Sigma RP bearing  was inserted, and the knee brought to full extension with compression.  The patellar button was clamped into place, and excess cement  removed. While the cement cured the wound was irrigated out with normal saline solution pulse lavage, and medium Hemovac drains were placed from an anterolateral  approach. Ligament stability and patellar tracking were checked and found to be excellent. The parapatellar arthrotomy was closed with  running #1 Vicryl suture. The subcutaneous tissue with 0 and 2-0 undyed  Vicryl suture, and the skin with skin staples. A  dressing of Xeroform,  4 x 4, dressing sponges, Webril, and Ace wrap applied. The patient  awakened, extubated, and taken to recovery room without difficulty.   Grazia Taffe J 10/07/2013, 11:56 AM

## 2013-10-07 NOTE — Progress Notes (Signed)
Orthopedic Tech Progress Note Patient Details:  Michelle Sims 09/02/1929 [id number] On cpm at 8:10 pm Patient ID: Michelle Sims, female   DOB: 08/25/1929, 80 y.o.   MRN: [id number]   Michelle Sims 10/07/2013, 8:24 PM

## 2013-10-07 NOTE — Progress Notes (Signed)
Orthopedic Tech Progress Note Patient Details:  Michelle Sims Apr 13, 1929 [id number]  CPM Right Knee CPM Right Knee: On Right Knee Flexion (Degrees): 40 Right Knee Extension (Degrees): 10 Additional Comments: Trapeze bar and foot roll   Cammer, Theodoro Parma 10/07/2013, 2:27 PM

## 2013-10-07 NOTE — Anesthesia Postprocedure Evaluation (Signed)
  Anesthesia Post-op Note  Patient: Michelle Sims  Procedure(s) Performed: Procedure(s): RIGHT TOTAL KNEE ARTHROPLASTY (Right)  Patient Location: PACU  Anesthesia Type:General  Level of Consciousness: awake and alert   Airway and Oxygen Therapy: Patient Spontanous Breathing  Post-op Pain: mild  Post-op Assessment: Post-op Vital signs reviewed and Respiratory Function Stable  Post-op Vital Signs: Reviewed and stable  Last Vitals:  Filed Vitals:   10/07/13 1248  BP: 122/71  Pulse: 69  Temp:   Resp: 8    Complications: No apparent anesthesia complications

## 2013-10-07 NOTE — Progress Notes (Signed)
Patient ID: Michelle Sims, female   DOB: 10/22/1929, 80 y.o.   MRN: [id number]   Date: 10/07/2013 ID: Michelle Sims, DOB 02/19/1929, MRN [id number] PCP: Henrine Screws, MD  Reason: Preoperative surgical clearance  ASSESSMENT;  1. Preoperative cardiovascular clearance  2. New right bundle branch block. No cardiopulmonary symptoms.  3. Hypertension 4. Right knee osteoarthritis requiring upcoming surgery  PLAN:  1. Cleared for upcoming total knee replacement 2. No further cardiac evaluation needed   SUBJECTIVE: [name] is a 80 y.o. female who is 80 years of age and is scheduled to have a total right knee replacement. Preoperative evaluation revealed a right bundle branch block. She had no cardiopulmonary complaints. She specifically denies dyspnea, chest discomfort, syncope, orthopnea, palpitations, cough, and wheezing. She is limited by the right knee arthritis..   Allergies  Allergen Reactions  . Biaxin [Clarithromycin] Other (See Comments)    Pt does not remember reaction   . Ciprofloxacin Nausea And Vomiting  . Hctz [Hydrochlorothiazide] Other (See Comments)    weakness and hyponatremia  . Macrobid [Nitrofurantoin] Nausea Only  . Prednisone Other (See Comments)    Swelling and facial redness   . Sulfamethoxazole-Trimethoprim Other (See Comments)    Sore throat    Current Facility-Administered Medications on File Prior to Visit  Medication Dose Route Frequency Provider Last Rate Last Dose  . bupivacaine liposome (EXPAREL) 1.3 % injection 266 mg  20 mL Infiltration To OR Kerin Salen, MD      . chlorhexidine (HIBICLENS) 4 % liquid 4 application  60 mL Topical Once Kerin Salen, MD      . dextrose 5 %-0.45 % sodium chloride infusion   Intravenous Continuous Kerin Salen, MD      . fentaNYL (SUBLIMAZE) injection 25-50 mcg  25-50 mcg Intravenous Q5 min PRN Alexis Frock, MD   25 mcg at 10/07/13 1420  . lactated ringers infusion   Intravenous Continuous  Alexis Frock, MD      . meperidine (DEMEROL) injection 6.25-12.5 mg  6.25-12.5 mg Intravenous Q5 min PRN Alexis Frock, MD      . methocarbamol (ROBAXIN) tablet 500 mg  500 mg Oral Q6H PRN Kerin Salen, MD       Or  . methocarbamol (ROBAXIN) 500 mg in dextrose 5 % 50 mL IVPB  500 mg Intravenous Q6H PRN Kerin Salen, MD   500 mg at 10/07/13 1335  . promethazine (PHENERGAN) injection 6.25-12.5 mg  6.25-12.5 mg Intravenous Q15 min PRN Alexis Frock, MD       Current Outpatient Prescriptions on File Prior to Visit  Medication Sig Dispense Refill  . acetaminophen (TYLENOL) 500 MG tablet Take 1,000 mg by mouth every 6 (six) hours as needed.      Marland Kitchen amLODipine (NORVASC) 5 MG tablet Take 5 mg by mouth at bedtime.       . Calcium Carb-Cholecalciferol (CALCIUM 600 + D PO) Take 1 tablet by mouth daily with lunch.      . carbidopa-levodopa (SINEMET IR) 25-100 MG per tablet Take 1 tablet by mouth 3 (three) times daily.  90 tablet  5  . Cholecalciferol (VITAMIN D3) 2000 UNITS TABS Take 2,000 Units by mouth daily.       . clonazePAM (KLONOPIN) 0.5 MG tablet Take 0.25 mg by mouth at bedtime.       . Cranberry (SM CRANBERRY) 300 MG tablet Take 300 mg by mouth daily.      . DULoxetine (CYMBALTA) 30 MG capsule  Take 30 mg by mouth daily with supper.       Marland Kitchen ibuprofen (ADVIL,MOTRIN) 200 MG tablet Take 400 mg by mouth every 6 (six) hours as needed.      . irbesartan (AVAPRO) 75 MG tablet Take 75 mg by mouth at bedtime.       Marland Kitchen levothyroxine (SYNTHROID, LEVOTHROID) 112 MCG tablet Take 112 mcg by mouth daily before breakfast.      . methenamine (HIPREX) 1 G tablet Take 1 g by mouth daily.       . Multiple Vitamin (MULTIVITAMIN WITH MINERALS) TABS tablet Take 1 tablet by mouth daily. Women's Over 65      . NONFORMULARY OR COMPOUNDED ITEM Place 1 application vaginally 3 (three) times a week. Estradiol 0.02% vaginal cream compounded by [employer] applicators.  S:  Insert vaginally Tuesday,  Thursday and Saturday      . Probiotic Product (ALIGN) 4 MG CAPS Take 4 mg by mouth daily.      . raloxifene (EVISTA) 60 MG tablet Take 60 mg by mouth daily after breakfast.       . vitamin C (ASCORBIC ACID) 500 MG tablet Take 500 mg by mouth daily.      Marland Kitchen aspirin EC 325 MG tablet Take 1 tablet (325 mg total) by mouth 2 (two) times daily.  30 tablet  0  . oxyCODONE-acetaminophen (ROXICET) 5-325 MG per tablet Take 1 tablet by mouth every 4 (four) hours as needed.  60 tablet  0  . tizanidine (ZANAFLEX) 2 MG capsule Take 1 capsule (2 mg total) by mouth 3 (three) times daily.  60 capsule  0    Past Medical History  Diagnosis Date  . Hypertension   . Blood type, Rh negative   . Endocervical polyp   . Parkinson disease   . Osteopenia 05/2013    T score -1.5  . Insomnia   . Anxiety   . IBS (irritable bowel syndrome)   . GERD (gastroesophageal reflux disease)   . Diverticulosis   . Lymphedema   . Chronic fatigue   . Hypothyroidism   . Depression   . Neuromuscular disorder     Parkinson's disease   . Arthritis     knee & back   . Thyroid cancer   . Breast cancer   . RBBB 10/07/2013    Past Surgical History  Procedure Laterality Date  . Breast surgery  1979    MASTECTOMY AND RECONSTRUCTION  . Appendectomy  1979  . Tubal ligation    . Dilation and curettage of uterus      MISCARRIAGE  . Tonsillectomy    . Hysteroscopy  [id number]    AND D&C  . Thyroidectomy    . Mastectomy Bilateral   . Eye surgery Bilateral     w/ IOL    History   Social History  . Marital Status: Married    Spouse Name: N/A    Number of Children: N/A  . Years of Education: N/A   Occupational History  . Not on file.   Social History Main Topics  . Smoking status: Never Smoker   . Smokeless tobacco: Never Used  . Alcohol Use: Yes     Comment: rare  . Drug Use: No  . Sexual Activity: No   Other Topics Concern  . Not on file   Social History Narrative  . No narrative on file    Family  History  Problem Relation Age of Onset  .  Hypertension Mother   . Hyperlipidemia Mother   . Hypertension Father   . Hyperlipidemia Father   . Hyperlipidemia Brother   . Hypertension Brother   . Heart failure Mother   . Heart failure Father     ROS: Has difficulty answering questions without adding additional information. Denies angina. Has not had palpitations or syncope. No history of heart disease or heart attack. Denies orthopnea, PND, edema, claudication, and ascites appeared. Other systems negative for complaints.  OBJECTIVE: BP 100/64  Pulse 79  Ht 5\' 3"  (1.6 m)  Wt 135 lb 12.8 oz (61.598 kg)  BMI 24.06 kg/m2,  General: No acute distress, elderly and anxious HEENT: normal no pallor or jaundice Neck: JVD flat. Carotids absent Chest: Clear Cardiac: Murmur: None. Gallop: S4. Rhythm: Normal. Other: Normal Abdomen: Bruit: Absent. Pulsation: 2+ and symmetric Extremities: Edema: None. Pulses: 2+ Neuro: Normal Psych: Anxiety but otherwise without focal  ECG: Normal sinus rhythm with right bundle branch block and left axis. This is new compared to 2013. No evidence of infarction.   Past Medical History  Hypertension   Insomnia   Anxiety   History of breast cancer   Right breast cancer, status post right mastectomy with right upper extremity lymphedema   Hypothyroidism   Irritable bowel syndrome--colonic bx's '05 neg for microscopic colitis   Sigmoid diverticulosis   history thyroid carcinoma, 1987, treated with thyroidectomy. Followed by Dr. Electa Sniff   GERD   Osteopenia   moderate aortic valve sclerosis per 2-D echo January, 2006   Allergic rhinitis   Thiazide-induced hyponatremia   Vaginal polyps   postcoital UTIs   Migraine headaches   colon polyps--diminutive adenomas ([facility] '02; f/u colon neg '05)   Chronic fatigue   Chronic crampy low abd pn (10/2008)   diverticulosis - noted on colonoscopy, November, 2010 - Dr. Mikki Santee Buccini   thoracic back  pain - Dr. Oneida Alar - November, 2011   DJD of the knees, neck, and thoracic spine

## 2013-10-07 NOTE — Anesthesia Preprocedure Evaluation (Signed)
Anesthesia Evaluation  Patient identified by MRN, date of birth, ID band Patient awake    Reviewed: Allergy & Precautions, H&P , Patient's Chart, lab work & pertinent test results  Airway Mallampati: II TM Distance: >3 FB Neck ROM: Full    Dental  (+) Teeth Intact, Dental Advisory Given   Pulmonary  breath sounds clear to auscultation        Cardiovascular hypertension, Pt. on medications Rhythm:Regular Rate:Normal  Cleared by cardiology this am   Neuro/Psych Anxiety    GI/Hepatic GERD-  Medicated,  Endo/Other  Hypothyroidism (on replacement)   Renal/GU      Musculoskeletal   Abdominal (+)  Abdomen: soft.    Peds  Hematology   Anesthesia Other Findings   Reproductive/Obstetrics                           Anesthesia Physical Anesthesia Plan  ASA: III  Anesthesia Plan: General   Post-op Pain Management:    Induction: Intravenous  Airway Management Planned: LMA  Additional Equipment:   Intra-op Plan:   Post-operative Plan:   Informed Consent: I have reviewed the patients History and Physical, chart, labs and discussed the procedure including the risks, benefits and alternatives for the proposed anesthesia with the patient or authorized representative who has indicated his/her understanding and acceptance.     Plan Discussed with:   Anesthesia Plan Comments:         Anesthesia Quick Evaluation

## 2013-10-07 NOTE — Transfer of Care (Signed)
Immediate Anesthesia Transfer of Care Note  Patient: Michelle Sims  Procedure(s) Performed: Procedure(s): RIGHT TOTAL KNEE ARTHROPLASTY (Right)  Patient Location: PACU  Anesthesia Type:GA combined with regional for post-op pain  Level of Consciousness: awake, alert  and oriented  Airway & Oxygen Therapy: Patient Spontanous Breathing and Patient connected to nasal cannula oxygen  Post-op Assessment: Report given to PACU RN and Post -op Vital signs reviewed and stable  Post vital signs: Reviewed and stable  Complications: No apparent anesthesia complications

## 2013-10-08 LAB — URINALYSIS, ROUTINE W REFLEX MICROSCOPIC
Bilirubin Urine: NEGATIVE
GLUCOSE, UA: NEGATIVE mg/dL
Hgb urine dipstick: NEGATIVE
Ketones, ur: NEGATIVE mg/dL
Leukocytes, UA: NEGATIVE
Nitrite: NEGATIVE
PH: 6 (ref 5.0–8.0)
Protein, ur: NEGATIVE mg/dL
Specific Gravity, Urine: 1.007 (ref 1.005–1.030)
Urobilinogen, UA: 0.2 mg/dL (ref 0.0–1.0)

## 2013-10-08 LAB — BASIC METABOLIC PANEL
Anion gap: 12 (ref 5–15)
BUN: 13 mg/dL (ref 6–23)
CALCIUM: 8.1 mg/dL — AB (ref 8.4–10.5)
CO2: 24 mEq/L (ref 19–32)
CREATININE: 0.57 mg/dL (ref 0.50–1.10)
Chloride: 101 mEq/L (ref 96–112)
GFR calc Af Amer: >90 mL/min (ref >90)
GFR, EST NON AFRICAN AMERICAN: 84 mL/min — AB (ref >90)
GLUCOSE: 151 mg/dL — AB (ref 70–99)
Potassium: 3.9 mEq/L (ref 3.7–5.3)
SODIUM: 137 meq/L (ref 137–147)

## 2013-10-08 LAB — CBC
HCT: 32.7 % — ABNORMAL LOW (ref 36.0–46.0)
HEMOGLOBIN: 10.9 g/dL — AB (ref 12.0–15.0)
MCH: 30.6 pg (ref 26.0–34.0)
MCHC: 33.3 g/dL (ref 30.0–36.0)
MCV: 91.9 fL (ref 78.0–100.0)
Platelets: 183 10*3/uL (ref 150–400)
RBC: 3.56 MIL/uL — ABNORMAL LOW (ref 3.87–5.11)
RDW: 13 % (ref 11.5–15.5)
WBC: 9.3 10*3/uL (ref 4.0–10.5)

## 2013-10-08 MED ORDER — HYDROCODONE-ACETAMINOPHEN 5-325 MG PO TABS: 1.0000 | ORAL_TABLET | ORAL | Status: DC | PRN

## 2013-10-08 NOTE — Clinical Social Work Placement (Addendum)
Clinical Social Work Department CLINICAL SOCIAL WORK PLACEMENT NOTE 10/08/2013  Patient:  Michelle Sims, Michelle Sims  Account Number:  [id number] Admit date:  10/07/2013  Clinical Social Worker:  Barbette Or, LCSW  Date/time:  10/08/2013 02:00 PM  Clinical Social Work is seeking post-discharge placement for this patient at the following level of care:   [place]   (*CSW will update this form in Epic as items are completed)   10/08/2013  Patient/family provided with [place] Department of Clinical Social Work's list of facilities offering this level of care within the geographic area requested by the patient (or if unable, by the patient's family).  10/08/2013  Patient/family informed of their freedom to choose among providers that offer the needed level of care, that participate in Medicare, Medicaid or managed care program needed by the patient, have an available bed and are willing to accept the patient.  10/08/2013  Patient/family informed of [facility]' ownership interest in [facility], as well as of the fact that they are under no obligation to receive care at this facility.  PASARR submitted to EDS on  PASARR number received on   FL2 transmitted to all facilities in geographic area requested by pt/family on  10/08/2013 FL2 transmitted to all facilities within larger geographic area on   Patient informed that his/her managed care company has contracts with or will negotiate with  certain facilities, including the following:     Patient/family informed of bed offers received:   Patient chooses bed at [facility] Physician recommends and patient chooses bed at    Patient to be transferred to [facility] on  10/09/2013 Patient to be transferred to facility by Personal vehicle (pt's husband) Lubertha Sayres, [name] Patient and family notified of transfer on 10/10/2013 Lubertha Sayres, [place] Name of family member notified:  [name], pt's husband. Lubertha Sayres,  LCSWA  The following physician request were entered in Epic:   Additional Comments:   09/15 - no pasarr needed per facility  Lubertha Sayres, [place] ([phone]) Licensed Clinical Social Worker Neuroscience [phone]) and Medical ICU (11M)

## 2013-10-08 NOTE — Progress Notes (Signed)
Physical Therapy Treatment Patient Details Name: Michelle Sims MRN: [id number] DOB: 01/19/1930 Today's Date: 10/08/2013    History of Present Illness 80 y.o. female s/p right total knee arthroplasty.     PT Comments    Patient progressing towards physical therapy goals. Able to ambulate up to 15 feet this afternoon however, requires frequent assistance for walker control and cues for sequencing. After speaking with husband, they plan for her to transfer to SNF within [facility] for continued rehabilitation. Patient will continue to benefit from skilled physical therapy services to further improve independence with functional mobility.   Follow Up Recommendations  SNF     Equipment Recommendations  3in1 (PT)    Recommendations for Other Services OT consult     Precautions / Restrictions Precautions Precautions: Knee Precaution Booklet Issued: Yes (comment) Precaution Comments: Reviewed knee precautions Restrictions Weight Bearing Restrictions: Yes RLE Weight Bearing: Weight bearing as tolerated    Mobility  Bed Mobility Overal bed mobility: Needs Assistance Bed Mobility: Sit to Supine       Sit to supine: Min assist   General bed mobility comments: Min assist for RLE support into bed. VC for technique. Educated to use LLE to support RLE.  Transfers Overall transfer level: Needs assistance Equipment used: Rolling walker (2 wheeled) Transfers: Sit to/from Stand Sit to Stand: Min assist         General transfer comment: Min assist for boost from recliner. VC for hand placement. Decreased weight bearing through RLE, Encouraged to equally distribute weight through LEs as able.  Ambulation/Gait Ambulation/Gait assistance: Min assist Ambulation Distance (Feet): 15 Feet Assistive device: Rolling walker (2 wheeled) Gait Pattern/deviations: Step-to pattern;Decreased step length - right;Decreased step length - left;Decreased stance time - right;Decreased stride  length;Shuffle;Antalgic   Gait velocity interpretation: Below normal speed for age/gender General Gait Details: Very slow gait. Max VC for sequencing. Educated on safe use of DME. Required frequent redirection with rolling walker. Very small shuffled steps. Instructed frequently for larger steps especially with turns.   Stairs            Wheelchair Mobility    Modified Rankin (Stroke Patients Only)       Balance                                    Cognition Arousal/Alertness: Lethargic Behavior During Therapy: Anxious Overall Cognitive Status: History of cognitive impairments - at baseline (Per husband pt has "Mild Parkinson's")       Memory: Decreased short-term memory              Exercises      General Comments General comments (skin integrity, edema, etc.): Time spent discussing d/c planning and exercises with husband who is very actively involved in pts recovery throughout therapy session. Husband concerned about CPM placement. Educated both patient and family on safe positioning for CPM use.      Pertinent Vitals/Pain Pain Assessment: 0-10 Pain Score:  ("doing okay" no value given) Pain Location: Rt knee Pain Intervention(s): Limited activity within patient's tolerance;Monitored during session;Repositioned    Home Living                      Prior Function            PT Goals (current goals can now be found in the care plan section) Acute Rehab PT Goals Patient Stated Goal: Go back to  [facility] PT Goal Formulation: With patient Time For Goal Achievement: 10/15/13 Potential to Achieve Goals: Good Progress towards PT goals: Progressing toward goals    Frequency  7X/week    PT Plan Current plan remains appropriate    Co-evaluation             End of Session   Activity Tolerance: Patient limited by fatigue Patient left: with call bell/phone within reach;in bed;in CPM;with family/visitor present     Time:  1400-1439 PT Time Calculation (min): 39 min  Charges:  $Gait Training: 8-22 mins $Therapeutic Activity: 8-22 mins $Self Care/Home Management: 8-22                    G Codes:      Elayne Snare, [place]  Ellouise Newer 10/08/2013, 3:32 PM

## 2013-10-08 NOTE — Progress Notes (Signed)
Patient ID: Michelle Sims, female   DOB: 1929/10/27, 80 y.o.   MRN: [id number] PATIENT ID: Michelle Sims  MRN: [id number]  DOB/AGE:  [birth year] / 80 y.o.  1 Day Post-Op Procedure(s) (LRB): RIGHT TOTAL KNEE ARTHROPLASTY (Right)    PROGRESS NOTE Subjective: Patient is alert, oriented, n0 Nausea, n0 Vomiting, yes passing gas, no Bowel Movement. Taking PO well. Denies SOB, Chest or Calf Pain. Using Incentive Spirometer, PAS in place. Ambulate WBAT,Stood up in the room.  Yesterday, CPM 0-40 Patient reports pain as 5 on 0-10 scale  .    Objective: Vital signs in last 24 hours: Filed Vitals:   10/07/13 2140 10/08/13 0000 10/08/13 0400 10/08/13 0405  BP: 149/74   147/70  Pulse: 72   74  Temp: 98.2 F (36.8 C)   98.1 F (36.7 C)  TempSrc:    Oral  Resp: 18 18 18 16   Weight:      SpO2: 100% 100% 100% 99%      Intake/Output from previous day: I/O last 3 completed shifts: In: 1525 [I.V.:1525] Out: 750 [Urine:200; Drains:550]   Intake/Output this shift:     LABORATORY DATA:  Recent Labs  10/08/13 0440  WBC 9.3  HGB 10.9*  HCT 32.7*  PLT 183  NA 137  K 3.9  CL 101  CO2 24  BUN 13  CREATININE 0.57  GLUCOSE 151*  CALCIUM 8.1*    Examination: Neurologically intact ABD soft Neurovascular intact Sensation intact distally Intact pulses distally Dorsiflexion/Plantar flexion intact Incision: dressing C/D/I No cellulitis present Compartment soft} Blood and plasma separated in drain indicating minimal recent drainage, drain pulled without difficulty.  Assessment:   1 Day Post-Op Procedure(s) (LRB): RIGHT TOTAL KNEE ARTHROPLASTY (Right) ADDITIONAL DIAGNOSIS: Expected Acute Blood Loss Anemia, Hypertension  Plan: PT/OT WBAT, CPM 5/hrs day until ROM 0-90 degrees, then D/C CPM DVT Prophylaxis:  SCDx72hrs, ASA 325 mg BID x 2 weeks DISCHARGE PLAN: Patient is a resident of [facility] and plans to go to the rehabilitation center for a few days to a week. She may or may not  require CPM.  She flexed to 45 today. DISCHARGE NEEDS: Home health PT, RN, for wound check     Issa Kosmicki J 10/08/2013, 8:02 AM

## 2013-10-08 NOTE — Progress Notes (Signed)
10/08/13 PT recommending SNF. Referral made to CSW. Patient lives at [facility]. [employer] set up by MD office to supply CPM. CM will continue to follow. Fuller Plan RN, BSN, CCM

## 2013-10-08 NOTE — Clinical Social Work Note (Signed)
Clinical Social Work Department BRIEF PSYCHOSOCIAL ASSESSMENT 10/08/2013  Patient:  Michelle Sims, Michelle Sims     Account Number:  [id number]     Admit date:  10/07/2013  Clinical Social Worker:  Myles Lipps  Date/Time:  10/08/2013 02:00 PM  Referred by:  Care Management  Date Referred:  10/08/2013 Referred for  SNF Placement   Other Referral:   Interview type:  Patient Other interview type:   Patient husband at bedside and orchestrated conversation    PSYCHOSOCIAL DATA Living Status:  FACILITY Admitted from facility:  [facility] Level of care:  Independent Living Primary support name:  Michelle Sims, Michelle Sims  [phone] Primary support relationship to patient:  SPOUSE Degree of support available:   Strong    CURRENT CONCERNS Current Concerns  Post-Acute Placement   Other Concerns:    SOCIAL WORK ASSESSMENT / PLAN Clinical Social Worker met with patient and patient husband at bedside to offer support and discuss patient needs at discharge.  Patient states that she currently resides at [facility].  Patient husband facilitated the remainder of assessment stating that patient will return to [facility] SNF when medically stable.  CSW contacted facility who is in agreement with patient return to [facility] SNF.  CSW provided facility with [facility] and referral information as requested.  CSW remains available for support and to facilitate patient discharge needs once medically ready.   Assessment/plan status:  Psychosocial Support/Ongoing Assessment of Needs Other assessment/ plan:   Information/referral to community resources:   Clinical Social Worker spoke with patient husband who is adamant that patient will return to [facility] and no other facility.  In speaking with facility, patient is appropriate to return and does not need a Pasarr prior to to admission.    PATIENT'S/FAMILY'S RESPONSE TO PLAN OF CARE: Patient alert and oriented x3 sitting up in the chair,  however periodically dozing off and husband finishing conversation.  Patient with good support from patient husband at home.  Patient is in agreement with ST-SNF placement and is hopeful for return soon.  Patient and patient husband verbalized understanding of CSW role and appreciation for support and concern.

## 2013-10-08 NOTE — Evaluation (Signed)
Physical Therapy Evaluation Patient Details Name: ZURIYAH SHATZ MRN: [id number] DOB: 12/28/1929 Today's Date: 10/08/2013   History of Present Illness  80 y.o. female s/p right total knee arthroplasty.   Clinical Impression  Pt is s/p right TKA, presenting with the deficits listed below (see PT Problem List). Pt required moderate assistance for bed mobility and transfer this AM. Unable to tolerate ambulation secondary to anxiety and rapid fatigue. Pt seemingly more confused at end of therapy session calling out for "Bill," (RN notified). At this time recommend SNF for post acute rehab due to decreased level of function, however pt adamant to return home. Will continue to work patient to progress to more independent level and update disposition recommendation accordingly. Pt will greatly benefit from skilled PT to increase their independence and safety with mobility.     Follow Up Recommendations SNF    Equipment Recommendations  3in1 (PT)    Recommendations for Other Services OT consult     Precautions / Restrictions Precautions Precautions: Knee Precaution Comments: Reviewed knee precautions Restrictions Weight Bearing Restrictions: Yes RLE Weight Bearing: Weight bearing as tolerated      Mobility  Bed Mobility Overal bed mobility: Needs Assistance Bed Mobility: Supine to Sit     Supine to sit: Mod assist;HOB elevated     General bed mobility comments: Mod assist for trunk control to seated position with scoot using bed pad. Heavy use of rail and HOB slightly elevated. VC for technique.  Transfers Overall transfer level: Needs assistance Equipment used: Rolling walker (2 wheeled) Transfers: Sit to/from [facility] Sit to Stand: Mod assist Stand pivot transfers: Min assist       General transfer comment: Mod assist for sit<>stand from lowest bed setting (similar to bed at home) for boost with Max VC for technique and hand placement. Min assist with  pivot transfer, able to lift left foot minimally to step back towards chair. Fatigued rapidly and began reaching for bed and chair unsafely prior to chair being directly behind pt. Max VC for technique and assist for walker control.  Ambulation/Gait             General Gait Details: Unable to tolerate WB on RLE enough to ambulate this AM  Stairs            Wheelchair Mobility    Modified Rankin (Stroke Patients Only)       Balance Overall balance assessment: Needs assistance Sitting-balance support: No upper extremity supported;Feet supported Sitting balance-Leahy Scale: Fair     Standing balance support: Bilateral upper extremity supported Standing balance-Leahy Scale: Poor                               Pertinent Vitals/Pain Pain Assessment: 0-10 Pain Score: 3  Pain Location: Rt knee Pain Intervention(s): Limited activity within patient's tolerance;Monitored during session    [place] expects to be discharged to:: Private residence Environmental education officer) Living Arrangements: Spouse/significant other Available Help at Discharge: Family (Unsure) Type of Home: Independent living facility Home Access: Level entry     Home Layout: One level Home Equipment: Environmental consultant - 2 wheels;Cane - single point;Grab bars - toilet      Prior Function Level of Independence: Independent with assistive device(s)         Comments: Used walker at night if needs to go to bathroom     Hand Dominance   Dominant Hand: Right    Extremity/Trunk Assessment  Upper Extremity Assessment: Defer to OT evaluation           Lower Extremity Assessment: RLE deficits/detail RLE Deficits / Details: decreased strength and ROM as expected post op       Communication   Communication: No difficulties  Cognition Arousal/Alertness: Lethargic Behavior During Therapy: Anxious Overall Cognitive Status: No family/caregiver present to determine baseline cognitive  functioning       Memory: Decreased short-term memory              General Comments      Exercises Total Joint Exercises Ankle Circles/Pumps: AROM;Both;10 reps;Seated Quad Sets: AROM;Left;10 reps;Seated      Assessment/Plan    PT Assessment Patient needs continued PT services  PT Diagnosis Difficulty walking;Abnormality of gait;Acute pain   PT Problem List Decreased strength;Decreased range of motion;Decreased activity tolerance;Decreased balance;Decreased mobility;Decreased knowledge of use of DME;Decreased knowledge of precautions;Decreased cognition;Impaired tone;Pain  PT Treatment Interventions DME instruction;Gait training;Stair training;Functional mobility training;Therapeutic activities;Therapeutic exercise;Balance training;Neuromuscular re-education;Cognitive remediation;Patient/family education;Modalities   PT Goals (Current goals can be found in the Care Plan section) Acute Rehab PT Goals Patient Stated Goal: Go back to [facility] PT Goal Formulation: With patient Time For Goal Achievement: 10/15/13 Potential to Achieve Goals: Good    Frequency 7X/week   Barriers to discharge        Co-evaluation               End of Session   Activity Tolerance: Patient limited by fatigue;Patient limited by pain Patient left: in chair;with call bell/phone within reach Nurse Communication: Mobility status         Time: 6269-4854 PT Time Calculation (min): 38 min   Charges:   PT Evaluation $Initial PT Evaluation Tier I: 1 Procedure PT Treatments $Therapeutic Activity: 23-37 mins   PT G Codes:         Elayne Snare, [place]  Ellouise Newer 10/08/2013, 12:49 PM

## 2013-10-08 NOTE — Progress Notes (Signed)
Patient with occasional confused comments, now with frequency,urgency. PA notified - U/A sent.

## 2013-10-09 LAB — CBC
HEMATOCRIT: 29.7 % — AB (ref 36.0–46.0)
Hemoglobin: 10 g/dL — ABNORMAL LOW (ref 12.0–15.0)
MCH: 30.8 pg (ref 26.0–34.0)
MCHC: 33.7 g/dL (ref 30.0–36.0)
MCV: 91.4 fL (ref 78.0–100.0)
Platelets: 162 10*3/uL (ref 150–400)
RBC: 3.25 MIL/uL — AB (ref 3.87–5.11)
RDW: 13 % (ref 11.5–15.5)
WBC: 9.4 10*3/uL (ref 4.0–10.5)

## 2013-10-09 NOTE — Progress Notes (Signed)
PATIENT ID: Michelle Sims  MRN: [id number]  DOB/AGE:  1929-02-15 / 80 y.o.  2 Days Post-Op Procedure(s) (LRB): RIGHT TOTAL KNEE ARTHROPLASTY (Right)    PROGRESS NOTE Subjective: Patient is alert, oriented, no Nausea, no Vomiting, yes passing gas, no Bowel Movement. Taking PO well. Denies SOB, Chest or Calf Pain. Using Incentive Spirometer, PAS in place. Ambulate WBAT with pt moving around room with therapy assist, CPM 0-45 Patient reports pain as 7 on 0-10 scale  .    Objective: Vital signs in last 24 hours: Filed Vitals:   10/08/13 2158 10/09/13 0030 10/09/13 0246 10/09/13 0614  BP: 152/58   140/71  Pulse: 65   84  Temp: 98.4 F (36.9 C)   98.7 F (37.1 C)  TempSrc:      Resp: 18   16  Weight:      SpO2: 90% 95% 95% 93%      Intake/Output from previous day: I/O last 3 completed shifts: In: 1980 [P.O.:480; I.V.:1500] Out: 950 [Urine:500; Drains:450]   Intake/Output this shift:     LABORATORY DATA:  Recent Labs  10/08/13 0440 10/09/13 0554  WBC 9.3 9.4  HGB 10.9* 10.0*  HCT 32.7* 29.7*  PLT 183 162  NA 137  --   K 3.9  --   CL 101  --   CO2 24  --   BUN 13  --   CREATININE 0.57  --   GLUCOSE 151*  --   CALCIUM 8.1*  --     Examination: Neurologically intact Neurovascular intact Sensation intact distally Intact pulses distally Dorsiflexion/Plantar flexion intact Incision: dressing C/D/I No cellulitis present Compartment soft}  Assessment:   2 Days Post-Op Procedure(s) (LRB): RIGHT TOTAL KNEE ARTHROPLASTY (Right) ADDITIONAL DIAGNOSIS: Expected Acute Blood Loss Anemia, Hypertension  Plan: PT/OT WBAT, CPM 5/hrs day until ROM 0-90 degrees, then D/C CPM DVT Prophylaxis:  SCDx72hrs, ASA 325 mg BID x 2 weeks DISCHARGE PLAN: Skilled Nursing Facility/Rehab Patient is a resident of [facility] and plans to go to the rehabilitation center for a few days to a week.   DISCHARGE NEEDS: HHPT, HHRN, CPM, Walker and 3-in-1 comode seat     Jenille Laszlo  R 10/09/2013, 8:26 AM

## 2013-10-09 NOTE — Evaluation (Signed)
Occupational Therapy Evaluation Patient Details Name: Michelle Sims MRN: [id number] DOB: 1929-05-19 Today's Date: 10/09/2013    History of Present Illness 80 y.o. female s/p right total knee arthroplasty.    Clinical Impression   Michelle Sims continues to need overall min assist for selfcare tasks and toileting.  In addition, she needs mod to max instructional cueing for safety and for sequencing simple tasks such as sit to stand or mobility.  She at times attempts to stand without any regard for the need to use the RW or have assistance helping her.  Feel overall she will need acute OT to help increase back to modified independent level for basic selfcare tasks.  Do not think she will reach this level in acute stay so SNF recommended for OT.  Will continue to follow while in acute care.     Follow Up Recommendations  SNF;Supervision/Assistance - 24 hour    Equipment Recommendations  None recommended by OT (To be determined prior to discharge from SNF.)       Precautions / Restrictions Precautions Precautions: Knee;Fall Precaution Comments: Reviewed knee precautions Restrictions Weight Bearing Restrictions: No RLE Weight Bearing: Weight bearing as tolerated      Mobility Bed Mobility               General bed mobility comments: Patient up in recliner before and after session  Transfers Overall transfer level: Needs assistance Equipment used: Rolling walker (2 wheeled);Ambulation equipment used Transfers: Sit to/from Stand Sit to Stand: Min assist Stand pivot transfers: Min assist       General transfer comment: Max instructional cueing and min assist for sit to stand.    Balance   Sitting-balance support: Single extremity supported Sitting balance-Leahy Scale: Fair       Standing balance-Leahy Scale: Poor                              ADL Overall ADL's : Needs assistance/impaired Eating/Feeding: Independent;Sitting   Grooming: Wash/dry  hands;Standing;Minimal assistance   Upper Body Bathing: Supervision/ safety;Sitting   Lower Body Bathing: Minimal assistance;Sit to/from stand   Upper Body Dressing : Supervision/safety;Sitting   Lower Body Dressing: Minimal assistance;Sit to/from stand   Toilet Transfer: Minimal assistance;Comfort height toilet;Ambulation;RW;Grab bars   Toileting- Clothing Manipulation and Hygiene: Minimal assistance;Sitting/lateral lean;Sit to/from stand       Functional mobility during ADLs: Minimal assistance;Rolling walker;Cueing for sequencing;Cueing for safety General ADL Comments: Pt with decreased memory and some anxiety with movement.  She needed max instructional cueing to sequence sit to stand as well as taking steps.  SNF recommended.        Perception Perception Perception Tested?: Yes   Praxis Praxis Praxis tested?: Not tested    Pertinent Vitals/Pain Pain Assessment: Faces Faces Pain Scale: Hurts little more Pain Location: right knee Pain Descriptors / Indicators: Penetrating;Discomfort Pain Intervention(s): Limited activity within patient's tolerance;Repositioned     Hand Dominance Right   Extremity/Trunk Assessment Upper Extremity Assessment Upper Extremity Assessment: Generalized weakness (Pt with compression garments on the RUE secondary to history of lymphedema)   Lower Extremity Assessment Lower Extremity Assessment: Defer to PT evaluation   Cervical / Trunk Assessment Cervical / Trunk Assessment: Normal   Communication Communication Communication: No difficulties   Cognition Arousal/Alertness: Awake/alert Behavior During Therapy: WFL for tasks assessed/performed Overall Cognitive Status: History of cognitive impairments - at baseline Area of Impairment: Memory;Safety/judgement;Awareness     Memory: Decreased short-term memory  Safety/Judgement: Decreased awareness of safety Awareness: Intellectual   General Comments: Pt with decreased ability to  recall the need for assistance when getting up and attempts to stand without RW or therapist in place.  Also attempted to ambulate from the sink in the bathroom to the bedside chair without the RW after washing her hands.  Max demonstrational cueing to sequence walker and stepping.               Home Living Family/patient expects to be discharged to:: Private residence Environmental education officer) Living Arrangements: Spouse/significant other Available Help at Discharge: Family (Unsure) Type of Home: Independent living facility Home Access: Level entry     Home Layout: One level     Bathroom Shower/Tub: Occupational psychologist: Handicapped height Bathroom Accessibility: Yes How Accessible: Accessible via wheelchair;Accessible via walker Home Equipment: [place] - 2 wheels;Cane - single point;Grab bars - toilet;Grab bars - tub/shower          Prior Functioning/Environment Level of Independence: Independent with assistive device(s)        Comments: Used walker at night if needs to go to bathroom    OT Diagnosis: Generalized weakness;Acute pain;Altered mental status;Cognitive deficits   OT Problem List: Decreased strength;Decreased activity tolerance;Impaired balance (sitting and/or standing);Decreased cognition;Decreased safety awareness;Pain;Decreased knowledge of use of DME or AE   OT Treatment/Interventions: Self-care/ADL training;Therapeutic exercise;Neuromuscular education;Therapeutic activities;DME and/or AE instruction;Balance training;Patient/family education    OT Goals(Current goals can be found in the care plan section) Acute Rehab OT Goals Patient Stated Goal: Get her right knee from hurting and be able to walk. OT Goal Formulation: With patient/family Time For Goal Achievement: 10/16/13 Potential to Achieve Goals: Good  OT Frequency: Min 2X/week              End of Session Equipment Utilized During Treatment: Rolling walker CPM Right Knee CPM Right Knee:  Off Nurse Communication: Mobility status  Activity Tolerance: Patient tolerated treatment well Patient left: in chair;with call bell/phone within reach;with family/visitor present   Time: 0388-8280 OT Time Calculation (min): 33 min Charges:  OT General Charges $OT Visit: 1 Procedure OT Evaluation $Initial OT Evaluation Tier I: 1 Procedure OT Treatments $Self Care/Home Management : 23-37 mins  Paolina Karwowski 10/09/2013, 1:53 PM OTR/L

## 2013-10-09 NOTE — Progress Notes (Signed)
Physical Therapy Treatment Patient Details Name: MARIALICE NEWKIRK MRN: [id number] DOB: Apr 08, 1929 Today's Date: 10/09/2013    History of Present Illness 80 y.o. female s/p right total knee arthroplasty.     PT Comments    Patient progressing and engaging in therapy much better today. Able to walk out in hallway and complete full HEP. Continue to recommend SNF for ongoing Physical Therapy.     Follow Up Recommendations  SNF     Equipment Recommendations  3in1 (PT)    Recommendations for Other Services       Precautions / Restrictions Precautions Precautions: Knee Precaution Comments: Reviewed knee precautions Restrictions RLE Weight Bearing: Weight bearing as tolerated    Mobility  Bed Mobility               General bed mobility comments: Patient up in recliner before and after session  Transfers Overall transfer level: Needs assistance Equipment used: Rolling walker (2 wheeled)   Sit to Stand: Min assist         General transfer comment: Min assist for boost from recliner. VC for hand placement. Good weight through BLEs  Ambulation/Gait Ambulation/Gait assistance: Min assist Ambulation Distance (Feet): 80 Feet Assistive device: Rolling walker (2 wheeled) Gait Pattern/deviations: Step-through pattern;Decreased stride length   Gait velocity interpretation: Below normal speed for age/gender General Gait Details: Patient able to work on step through gait when cued to take bigger steps on L LE. Cues for upright posture. A with RW management   Stairs            Wheelchair Mobility    Modified Rankin (Stroke Patients Only)       Balance                                    Cognition Arousal/Alertness: Awake/alert Behavior During Therapy: WFL for tasks assessed/performed Overall Cognitive Status: History of cognitive impairments - at baseline                      Exercises Total Joint Exercises Quad Sets: AROM;Left;10  reps;Seated Heel Slides: AAROM;Right;10 reps Hip ABduction/ADduction: AAROM;Right;10 reps Straight Leg Raises: AAROM;Right;10 reps Long Arc Quad: 10 reps;Right;AAROM    General Comments        Pertinent Vitals/Pain Pain Assessment: No/denies pain    Home Living                      Prior Function            PT Goals (current goals can now be found in the care plan section) Progress towards PT goals: Progressing toward goals    Frequency  7X/week    PT Plan Current plan remains appropriate    Co-evaluation             End of Session Equipment Utilized During Treatment: Gait belt Activity Tolerance: Patient tolerated treatment well Patient left: in chair;with call bell/phone within reach     Time: 1022-1048 PT Time Calculation (min): 26 min  Charges:  $Gait Training: 8-22 mins $Therapeutic Exercise: 8-22 mins                    G Codes:      Jacqualyn Posey 10/09/2013, 12:02 PM  10/09/2013 Jacqualyn Posey PTA [phone] pager [phone] office

## 2013-10-10 ENCOUNTER — Encounter (HOSPITAL_COMMUNITY): Payer: Self-pay | Admitting: Orthopedic Surgery

## 2013-10-10 LAB — CBC
HEMATOCRIT: 29.8 % — AB (ref 36.0–46.0)
HEMOGLOBIN: 10.1 g/dL — AB (ref 12.0–15.0)
MCH: 31.4 pg (ref 26.0–34.0)
MCHC: 33.9 g/dL (ref 30.0–36.0)
MCV: 92.5 fL (ref 78.0–100.0)
Platelets: 174 10*3/uL (ref 150–400)
RBC: 3.22 MIL/uL — ABNORMAL LOW (ref 3.87–5.11)
RDW: 13.2 % (ref 11.5–15.5)
WBC: 9.3 10*3/uL (ref 4.0–10.5)

## 2013-10-10 NOTE — Progress Notes (Signed)
Physical Therapy Treatment Patient Details Name: Michelle Sims MRN: [id number] DOB: 1929/05/09 Today's Date: 10/10/2013    History of Present Illness 80 y.o. female s/p right total knee arthroplasty.     PT Comments    Patient progressing well today with ambulation. PLanning to DC to SNF later today   Follow Up Recommendations  SNF     Equipment Recommendations  3in1 (PT)    Recommendations for Other Services       Precautions / Restrictions Precautions Precautions: Knee;Fall Precaution Comments: Reviewed knee precautions Restrictions RLE Weight Bearing: Weight bearing as tolerated    Mobility  Bed Mobility Overal bed mobility: Needs Assistance Bed Mobility: Sit to Supine       Sit to supine: Min assist      Transfers Overall transfer level: Needs assistance Equipment used: Rolling walker (2 wheeled)   Sit to Stand: Min assist         General transfer comment: Min A to power up into standing. Cues to reinforce hand placement  Ambulation/Gait Ambulation/Gait assistance: Min assist Ambulation Distance (Feet): 150 Feet Assistive device: Rolling walker (2 wheeled) Gait Pattern/deviations: Step-through pattern;Decreased stride length   Gait velocity interpretation: Below normal speed for age/gender General Gait Details: Patient able to work on step through pattern with cues to increase step   Financial trader Rankin (Stroke Patients Only)       Balance                                    Cognition Arousal/Alertness: Awake/alert Behavior During Therapy: WFL for tasks assessed/performed Overall Cognitive Status: History of cognitive impairments - at baseline Area of Impairment: Safety/judgement         Safety/Judgement: Decreased awareness of safety          Exercises Total Joint Exercises Quad Sets: AROM;Left;10 reps;Seated Heel Slides: AAROM;Right;10 reps Hip  ABduction/ADduction: AAROM;Right;10 reps Straight Leg Raises: AAROM;Right;10 reps Long Arc Quad: 10 reps;Right;AAROM    General Comments        Pertinent Vitals/Pain      Home Living                      Prior Function            PT Goals (current goals can now be found in the care plan section) Progress towards PT goals: Progressing toward goals    Frequency  7X/week    PT Plan Current plan remains appropriate    Co-evaluation             End of Session Equipment Utilized During Treatment: Gait belt Activity Tolerance: Patient tolerated treatment well Patient left: in chair;with call bell/phone within reach     Time: 1000-1032 PT Time Calculation (min): 32 min  Charges:  $Gait Training: 8-22 mins $Therapeutic Exercise: 8-22 mins                    G Codes:      Jacqualyn Posey 10/10/2013, 10:42 AM  10/10/2013 Jacqualyn Posey PTA [phone] pager [phone] office

## 2013-10-10 NOTE — Progress Notes (Signed)
Pt discharged to rehab at [facility] where pt and pt's husband reside in independent living. Discharge paperwork, reasons to return to ED/MD, and follow up appts reviewed with pts. PIV removed. Discharge paperwork signed by pt and RN. Pt escorted off of unit via wheelchair to husband's car in main entrance.

## 2013-10-10 NOTE — Clinical Social Work Note (Signed)
Pt to be discharged to SNF portion of [facility]. Pt's husband, Zlaty Alexa, updated at bedside regarding discharge (discharge packet given to pt's husband).  Pt's husband, Shela Esses, to transport pt via personal vehicle. RN to please call report at [phone].  Lubertha Sayres, [place] ([phone]) Licensed Clinical Social Worker Neuroscience [phone]) and Medical ICU (30M)

## 2013-10-10 NOTE — Discharge Summary (Signed)
Patient ID: Michelle Sims MRN: [id number] DOB/AGE: Feb 12, 1929 80 y.o.  Admit date: 10/07/2013 Discharge date: 10/10/2013  Admission Diagnoses:  Osteoarthritis of the right knee  Discharge Diagnoses:  Status post right total knee arthroplasty  Past Medical History  Diagnosis Date  . Hypertension   . Blood type, Rh negative   . Endocervical polyp   . Parkinson disease   . Osteopenia 05/2013    T score -1.5  . Insomnia   . Anxiety   . IBS (irritable bowel syndrome)   . GERD (gastroesophageal reflux disease)   . Diverticulosis   . Lymphedema   . Chronic fatigue   . Hypothyroidism   . Depression   . Neuromuscular disorder     Parkinson's disease   . Arthritis     knee & back   . Thyroid cancer   . Breast cancer   . RBBB 10/07/2013    Surgeries: Procedure(s): RIGHT TOTAL KNEE ARTHROPLASTY on 10/07/2013   Consultants:    Discharged Condition: Improved  Hospital Course: AARIA HAPP is an 80 y.o. female who was admitted 10/07/2013 for operative treatment ofArthritis of knee. Patient has severe unremitting pain that affects sleep, daily activities, and work/hobbies. After pre-op clearance the patient was taken to the operating room on 10/07/2013 and underwent  Procedure(s): RIGHT TOTAL KNEE ARTHROPLASTY.    Patient was given perioperative antibiotics: Anti-infectives   Start     Dose/Rate Route Frequency Ordered Stop   10/07/13 1130  cefUROXime (ZINACEF) injection  Status:  Discontinued       As needed 10/07/13 1112 10/07/13 1226   10/07/13 0600  ceFAZolin (ANCEF) IVPB 2 g/50 mL premix     2 g 100 mL/hr over 30 Minutes Intravenous On call to O.R. 10/06/13 1354 10/07/13 1051       Patient was given sequential compression devices, early ambulation, and chemoprophylaxis to prevent DVT. Patient did very well regarding range of motion and achieved at 95 flexion prior to discharge. She was ambulating well weightbearing as tolerated. She did have some issues with memory  and following sequences with cueing. Skilled nursing facility has been recommended for OT and PT  Patient benefited maximally from hospital stay and there were no complications.    Recent vital signs: Patient Vitals for the past 24 hrs:  BP Temp Temp src Pulse Resp SpO2  10/10/13 0723 - - - - 16 96 %  10/10/13 0557 138/73 mmHg 98.3 F (36.8 C) Oral 90 - 95 %  10/09/13 2200 135/60 mmHg 98.8 F (37.1 C) - 95 16 96 %  10/09/13 2000 - - - - 18 -  10/09/13 1439 121/59 mmHg 97.6 F (36.4 C) - 85 18 97 %     Recent laboratory studies:  Recent Labs  10/08/13 0440 10/09/13 0554 10/10/13 0459  WBC 9.3 9.4 9.3  HGB 10.9* 10.0* 10.1*  HCT 32.7* 29.7* 29.8*  PLT 183 162 174  NA 137  --   --   K 3.9  --   --   CL 101  --   --   CO2 24  --   --   BUN 13  --   --   CREATININE 0.57  --   --   GLUCOSE 151*  --   --   CALCIUM 8.1*  --   --      Discharge Medications:     Medication List         acetaminophen 500 MG tablet  Commonly known  as:  TYLENOL  Take 1,000 mg by mouth every 6 (six) hours as needed.     ALIGN 4 MG Caps  Take 4 mg by mouth daily.     amLODipine 5 MG tablet  Commonly known as:  NORVASC  Take 5 mg by mouth at bedtime.     aspirin EC 325 MG tablet  Take 1 tablet (325 mg total) by mouth 2 (two) times daily.     CALCIUM 600 + D PO  Take 1 tablet by mouth daily with lunch.     carbidopa-levodopa 25-100 MG per tablet  Commonly known as:  SINEMET IR  Take 1 tablet by mouth 3 (three) times daily.     clonazePAM 0.5 MG tablet  Commonly known as:  KLONOPIN  Take 0.25 mg by mouth at bedtime.     DULoxetine 30 MG capsule  Commonly known as:  CYMBALTA  Take 30 mg by mouth daily with supper.     ibuprofen 200 MG tablet  Commonly known as:  ADVIL,MOTRIN  Take 400 mg by mouth every 6 (six) hours as needed.     irbesartan 75 MG tablet  Commonly known as:  AVAPRO  Take 75 mg by mouth at bedtime.     levothyroxine 112 MCG tablet  Commonly known as:   SYNTHROID, LEVOTHROID  Take 112 mcg by mouth daily before breakfast.     methenamine 1 G tablet  Commonly known as:  HIPREX  Take 1 g by mouth daily.     multivitamin with minerals Tabs tablet  Take 1 tablet by mouth daily. Women's Over Fifty     NONFORMULARY OR COMPOUNDED ITEM  Place 1 application vaginally 3 (three) times a week. Estradiol 0.02% vaginal cream compounded by [employer] applicators.  S:  Insert vaginally Tuesday, Thursday and Saturday     oxyCODONE-acetaminophen 5-325 MG per tablet  Commonly known as:  ROXICET  Take 1 tablet by mouth every 4 (four) hours as needed.     raloxifene 60 MG tablet  Commonly known as:  EVISTA  Take 60 mg by mouth daily after breakfast.     SM CRANBERRY 300 MG tablet  Generic drug:  Cranberry  Take 300 mg by mouth daily.     tizanidine 2 MG capsule  Commonly known as:  ZANAFLEX  Take 1 capsule (2 mg total) by mouth 3 (three) times daily.     vitamin C 500 MG tablet  Commonly known as:  ASCORBIC ACID  Take 500 mg by mouth daily.     Vitamin D3 2000 UNITS Tabs  Take 2,000 Units by mouth daily.        Diagnostic Studies: Dg Chest 2 View  09/27/2013   CLINICAL DATA:  Preoperative for right total knee arthroplasty; no history of tobacco use  EXAM: CHEST  2 VIEW  COMPARISON:  PA and lateral chest x-ray of Jun 05, 2009  FINDINGS: The lungs are mildly hyperinflated and clear. The heart and pulmonary vascularity are normal. There is mild tortuosity of the descending thoracic aorta. There is no pleural effusion. The bony thorax is unremarkable.  IMPRESSION: There is no acute cardiopulmonary disease.   Electronically Signed   By: David  Martinique   On: 09/27/2013 14:09    Disposition: Rehabilitation at [facility] facility.      Discharge Instructions   Call MD / Call 911    Complete by:  As directed   If you experience chest pain or shortness of  breath, CALL 911 and be transported to the hospital  emergency room.  If you develope a fever above 101 F, pus (white drainage) or increased drainage or redness at the wound, or calf pain, call your surgeon's office.     Change dressing    Complete by:  As directed   Change dressing on prn, then change the dressing daily with sterile 4 x 4 inch gauze dressing and apply TED hose.  You may clean the incision with alcohol prior to redressing.     Constipation Prevention    Complete by:  As directed   Drink plenty of fluids.  Prune juice may be helpful.  You may use a stool softener, such as Colace (over the counter) 100 mg twice a day.  Use MiraLax (over the counter) for constipation as needed.     Diet - low sodium heart healthy    Complete by:  As directed      Do not put a pillow under the knee. Place it under the heel.    Complete by:  As directed      Increase activity slowly as tolerated    Complete by:  As directed           because the patient achieved 95 flexion prior to discharge no CPM is to be prescribed or given.  Follow-up Information   Follow up with Kerin Salen, MD In 2 weeks.   Specialty:  Orthopedic Surgery   Contact information:   [place] [address] [phone]       Follow up with Kerin Salen, MD In 1 week.   Specialty:  Orthopedic Surgery   Contact information:   [place] [address] [phone]        Signed: Kerin Salen 10/10/2013, 7:54 AM

## 2013-10-10 NOTE — Progress Notes (Signed)
Patient ID: Michelle Sims, female   DOB: 12-Jan-1930, 80 y.o.   MRN: [id number] PATIENT ID: Michelle Sims  MRN: [id number]  DOB/AGE:  [birth year] / 80 y.o.  3 Days Post-Op Procedure(s) (LRB): RIGHT TOTAL KNEE ARTHROPLASTY (Right)    PROGRESS NOTE Subjective: Patient is alert, oriented, no Nausea, no Vomiting, yes passing gas, no Bowel Movement. Taking PO well. Denies SOB, Chest or Calf Pain. Using Incentive Spirometer, PAS in place. Ambulate weight bearing as tolerated roll with rolling walker according to physical therapy she needs maximal cuing for sequencing sitting standing and walking, Range of motion by my testing today was 5-95, therefore CPM will be discontinued. Patient reports pain as 2 on 0-10 scale  .    Objective: Vital signs in last 24 hours: Filed Vitals:   10/09/13 2000 10/09/13 2200 10/10/13 0557 10/10/13 0723  BP:  135/60 138/73   Pulse:  95 90   Temp:  98.8 F (37.1 C) 98.3 F (36.8 C)   TempSrc:   Oral   Resp: 18 16  16   Weight:      SpO2:  96% 95% 96%      Intake/Output from previous day: I/O last 3 completed shifts: In: 2820 [P.O.:1320; I.V.:1500] Out: -    Intake/Output this shift:     LABORATORY DATA:  Recent Labs  10/08/13 0440 10/09/13 0554 10/10/13 0459  WBC 9.3 9.4 9.3  HGB 10.9* 10.0* 10.1*  HCT 32.7* 29.7* 29.8*  PLT 183 162 174  NA 137  --   --   K 3.9  --   --   CL 101  --   --   CO2 24  --   --   BUN 13  --   --   CREATININE 0.57  --   --   GLUCOSE 151*  --   --   CALCIUM 8.1*  --   --     Examination: Neurologically intact ABD soft Neurovascular intact Sensation intact distally Intact pulses distally Dorsiflexion/Plantar flexion intact Incision: dressing C/D/I No cellulitis present Compartment soft}  Assessment:   3 Days Post-Op Procedure(s) (LRB): RIGHT TOTAL KNEE ARTHROPLASTY (Right) ADDITIONAL DIAGNOSIS: Expected Acute Blood Loss Anemia, Hypertension and Depression, chronic low back pain history of breast  cancer, history of thyroid  Plan: PT/OT WBAT, CPM 5/hrs day until ROM 0-90 degrees, then D/C CPM DVT Prophylaxis:  SCDx72hrs, ASA 325 mg BID x 2 weeks DISCHARGE PLAN: Skilled Nursing Facility/Rehab [facility] DISCHARGE NEEDS: PT, OT, Wound care, patient has achieved 95 of flexion prior to discharge and CPM will not be prescribed.Kerin Salen 10/10/2013, 7:32 AM

## 2013-10-15 LAB — BASIC METABOLIC PANEL
BUN: 18 mg/dL (ref 4–21)
Creatinine: 0.7 mg/dL (ref 0.5–1.1)
GLUCOSE: 85 mg/dL
Potassium: 3.9 mmol/L (ref 3.4–5.3)
Sodium: 139 mmol/L (ref 137–147)

## 2013-10-15 LAB — HEMOGLOBIN A1C: Hgb A1c MFr Bld: 5.6 % (ref 4.0–6.0)

## 2013-10-15 LAB — CBC AND DIFFERENTIAL
HEMATOCRIT: 31 % — AB (ref 36–46)
Hemoglobin: 10.4 g/dL — AB (ref 12.0–16.0)
PLATELETS: 314 10*3/uL (ref 150–399)
WBC: 8.6 10*3/mL

## 2013-10-16 ENCOUNTER — Other Ambulatory Visit: Payer: Self-pay | Admitting: *Deleted

## 2013-10-16 MED ORDER — TRAMADOL HCL 50 MG PO TABS: 50.0000 mg | ORAL_TABLET | Freq: Four times a day (QID) | ORAL | Status: DC | PRN

## 2013-10-30 ENCOUNTER — Encounter: Payer: Self-pay | Admitting: Nurse Practitioner

## 2013-10-30 ENCOUNTER — Non-Acute Institutional Stay (SKILLED_NURSING_FACILITY): Payer: BC Managed Care – PPO | Admitting: Nurse Practitioner

## 2013-10-30 DIAGNOSIS — M179 Osteoarthritis of knee, unspecified: Secondary | ICD-10-CM

## 2013-10-30 DIAGNOSIS — N390 Urinary tract infection, site not specified: Secondary | ICD-10-CM

## 2013-10-30 DIAGNOSIS — E039 Hypothyroidism, unspecified: Secondary | ICD-10-CM | POA: Insufficient documentation

## 2013-10-30 DIAGNOSIS — I10 Essential (primary) hypertension: Secondary | ICD-10-CM

## 2013-10-30 DIAGNOSIS — D649 Anemia, unspecified: Secondary | ICD-10-CM

## 2013-10-30 DIAGNOSIS — E890 Postprocedural hypothyroidism: Secondary | ICD-10-CM

## 2013-10-30 DIAGNOSIS — M1710 Unilateral primary osteoarthritis, unspecified knee: Secondary | ICD-10-CM

## 2013-10-30 DIAGNOSIS — IMO0002 Reserved for concepts with insufficient information to code with codable children: Secondary | ICD-10-CM

## 2013-10-30 NOTE — Progress Notes (Signed)
Patient ID: Michelle Sims, female   DOB: Jun 15, 1929, 80 y.o.   MRN: [id number]    Nursing Home Location:  [place] of Service: SNF (31)  PCP: Henrine Screws, MD  Allergies  Allergen Reactions  . Biaxin [Clarithromycin] Other (See Comments)    Pt does not remember reaction   . Ciprofloxacin Nausea And Vomiting  . Hctz [Hydrochlorothiazide] Other (See Comments)    weakness and hyponatremia  . Macrobid [Nitrofurantoin] Nausea Only  . Prednisone Other (See Comments)    Swelling and facial redness   . Sulfamethoxazole-Trimethoprim Other (See Comments)    Sore throat    Chief Complaint  Patient presents with  . Discharge Note    HPI:  Michelle Sims is an 80 y.o. female who was hospitalized on 10/07/2013 for operative treatment of arthritis of the right knee. Patient has severe unremitting pain that affects sleep, daily activities, and work/hobbies. After pre-op clearance the patient was taken to the operating room on 10/07/2013 and underwent a right total knee arthroplasty and then admitted to [facility] rehab for ongoing therapy. Patient currently doing well with therapy, now stable to discharge home with home health.   Review of Systems:  Review of Systems  Constitutional: Negative for activity change, appetite change and fatigue.  Eyes: Negative.   Respiratory: Negative for cough and shortness of breath.   Cardiovascular: Negative for chest pain, palpitations and leg swelling.  Gastrointestinal: Negative for abdominal pain, diarrhea and constipation.  Genitourinary: Negative for difficulty urinating.  Musculoskeletal: Negative for myalgias.       With good ROM to right knee, mild amount of swelling. Pain controlled  Skin: Negative for color change and wound.  Neurological: Positive for weakness. Negative for dizziness.    Past Medical History  Diagnosis Date  . Hypertension   . Blood type, Rh negative   . Endocervical polyp   .  Parkinson disease   . Osteopenia 05/2013    T score -1.5  . Insomnia   . Anxiety   . IBS (irritable bowel syndrome)   . GERD (gastroesophageal reflux disease)   . Diverticulosis   . Lymphedema   . Chronic fatigue   . Hypothyroidism   . Depression   . Neuromuscular disorder     Parkinson's disease   . Arthritis     knee & back   . Thyroid cancer   . Breast cancer   . RBBB 10/07/2013   Past Surgical History  Procedure Laterality Date  . Breast surgery  1979    MASTECTOMY AND RECONSTRUCTION  . Appendectomy  1979  . Tubal ligation    . Dilation and curettage of uterus      MISCARRIAGE  . Tonsillectomy    . Hysteroscopy  [id number]    AND D&C  . Thyroidectomy    . Mastectomy Bilateral   . Eye surgery Bilateral     w/ IOL  . Total knee arthroplasty Right 10/07/2013    Procedure: RIGHT TOTAL KNEE ARTHROPLASTY;  Surgeon: Kerin Salen, MD;  Location: [facility];  Service: Orthopedics;  Laterality: Right;   Social History:   reports that she has never smoked. She has never used smokeless tobacco. She reports that she drinks alcohol. She reports that she does not use illicit drugs.  Family History  Problem Relation Age of Onset  . Hypertension Mother   . Hyperlipidemia Mother   . Hypertension Father   . Hyperlipidemia Father   . Hyperlipidemia Brother   .  Hypertension Brother   . Heart failure Mother   . Heart failure Father     Medications: Patient's Medications  New Prescriptions   No medications on file  Previous Medications   ACETAMINOPHEN (TYLENOL) 500 MG TABLET    Take 1,000 mg by mouth every 6 (six) hours as needed.   AMLODIPINE (NORVASC) 5 MG TABLET    Take 5 mg by mouth at bedtime.    ASPIRIN EC 325 MG TABLET    Take 1 tablet (325 mg total) by mouth 2 (two) times daily.   CALCIUM CARB-CHOLECALCIFEROL (CALCIUM 600 + D PO)    Take 1 tablet by mouth daily with lunch.   CARBIDOPA-LEVODOPA (SINEMET IR) 25-100 MG PER TABLET    Take 1 tablet by mouth 3 (three) times  daily before meals.   CHOLECALCIFEROL (VITAMIN D3) 2000 UNITS TABS    Take 2,000 Units by mouth daily after lunch.    CLONAZEPAM (KLONOPIN) 0.5 MG TABLET    Take 0.25 mg by mouth at bedtime.    CRANBERRY (SM CRANBERRY) 300 MG TABLET    Take 300 mg by mouth daily.   DULOXETINE (CYMBALTA) 30 MG CAPSULE    Take 30 mg by mouth daily with supper.    IBUPROFEN (ADVIL,MOTRIN) 200 MG TABLET    Take 400 mg by mouth every 6 (six) hours as needed.   IRBESARTAN (AVAPRO) 75 MG TABLET    Take 75 mg by mouth at bedtime.    LEVOTHYROXINE (SYNTHROID, LEVOTHROID) 112 MCG TABLET    Take 112 mcg by mouth daily before breakfast.   METHENAMINE (HIPREX) 1 G TABLET    Take 1 g by mouth daily after breakfast.    MULTIPLE VITAMIN (MULTIVITAMIN WITH MINERALS) TABS TABLET    Take 1 tablet by mouth daily. Women's Over Fifty   NONFORMULARY OR COMPOUNDED ITEM    Place 1 application vaginally 3 (three) times a week. Estradiol 0.02% vaginal cream compounded by [employer] applicators.  S:  Insert vaginally Tuesday, Thursday and Saturday   OXYCODONE-ACETAMINOPHEN (ROXICET) 5-325 MG PER TABLET    Take 1 tablet by mouth every 4 (four) hours as needed.   PROBIOTIC PRODUCT (ALIGN) 4 MG CAPS    Take 4 mg by mouth daily with lunch.    RALOXIFENE (EVISTA) 60 MG TABLET    Take 60 mg by mouth daily after breakfast.    TIZANIDINE (ZANAFLEX) 2 MG CAPSULE    Take 1 capsule (2 mg total) by mouth 3 (three) times daily.   TRAMADOL (ULTRAM) 50 MG TABLET    Take 1 tablet (50 mg total) by mouth every 6 (six) hours as needed.   VITAMIN C (ASCORBIC ACID) 500 MG TABLET    Take 500 mg by mouth daily.  Modified Medications   No medications on file  Discontinued Medications   No medications on file     Physical Exam: Filed Vitals:   10/30/13 1324  BP: 125/75  Pulse: 74  Temp: 97.3 F (36.3 C)  Resp: 18    Physical Exam  Constitutional: She is oriented to person, place, and time. She appears well-developed and  well-nourished. No distress.  HENT:  Head: Normocephalic and atraumatic.  Mouth/Throat: Oropharynx is clear and moist. No oropharyngeal exudate.  Eyes: Conjunctivae are normal. Pupils are equal, round, and reactive to light.  Neck: Normal range of motion. Neck supple.  Cardiovascular: Normal rate, regular rhythm and normal heart sounds.   Pulmonary/Chest: Effort normal and breath sounds normal.  Abdominal:  Soft. Bowel sounds are normal.  Musculoskeletal: She exhibits edema (to right knee). She exhibits no tenderness.  Neurological: She is alert and oriented to person, place, and time.  Skin: Skin is warm and dry. She is not diaphoretic.  Healed surgical incision to right knee  Psychiatric: She has a normal mood and affect.    Labs reviewed: Basic Metabolic Panel:  Recent Labs  09/27/13 1344 10/08/13 0440 10/15/13  NA 137 137 139  K 3.9 3.9 3.9  CL 98 101  --   CO2 26 24  --   GLUCOSE 87 151*  --   BUN 20 13 18   CREATININE 0.61 0.57 0.7  CALCIUM 9.3 8.1*  --    Liver Function Tests: No results found for this basename: AST, ALT, ALKPHOS, BILITOT, PROT, ALBUMIN,  in the last 8760 hours No results found for this basename: LIPASE, AMYLASE,  in the last 8760 hours No results found for this basename: AMMONIA,  in the last 8760 hours CBC:  Recent Labs  09/27/13 1344 10/08/13 0440 10/09/13 0554 10/10/13 0459 10/15/13  WBC 6.3 9.3 9.4 9.3 8.6  NEUTROABS 4.4  --   --   --   --   HGB 13.4 10.9* 10.0* 10.1* 10.4*  HCT 40.2 32.7* 29.7* 29.8* 31*  MCV 93.1 91.9 91.4 92.5  --   PLT 231 183 162 174 314   TSH: No results found for this basename: TSH,  in the last 8760 hours A1C: Lab Results  Component Value Date   HGBA1C 5.6 10/15/2013    Assessment/Plan 1. Osteoarthrosis, unspecified whether generalized or localized, involving lower leg -s/p total knee. Doing well with therapy. Pain is controlled -has follow up scheduled with ortho  2. Essential hypertension Stable on  current medications  3. Anemia, unspecified anemia type -hgb stable from hospitalization, will need ongoing follow up from PCP  4. Postoperative hypothyroidism Hx of thyroid ca, conts on synthroid 112 mcg  5. UTI -noted UTI while in rehab, completed macrobid treatment, symptoms have resolved.   pt is stable for discharge-will need PT/OT per home health. No DME needed. Pt did not need prescriptions for discharge.  will need to follow up with PCP within 2 weeks.

## 2013-11-01 ENCOUNTER — Other Ambulatory Visit (HOSPITAL_COMMUNITY): Payer: Self-pay | Admitting: Gynecology

## 2013-11-13 ENCOUNTER — Other Ambulatory Visit: Payer: BC Managed Care – PPO

## 2013-11-15 ENCOUNTER — Other Ambulatory Visit: Payer: BC Managed Care – PPO

## 2013-11-15 ENCOUNTER — Ambulatory Visit
Admission: RE | Admit: 2013-11-15 | Discharge: 2013-11-15 | Disposition: A | Discharge status: Home / Self Care | Payer: BC Managed Care – PPO | Source: Ambulatory Visit | Attending: Internal Medicine | Admitting: Internal Medicine

## 2013-11-15 DIAGNOSIS — R1907 Generalized intra-abdominal and pelvic swelling, mass and lump: Secondary | ICD-10-CM

## 2013-11-25 ENCOUNTER — Encounter: Payer: Self-pay | Admitting: Nurse Practitioner

## 2013-12-09 ENCOUNTER — Other Ambulatory Visit: Payer: Self-pay | Admitting: Gynecology

## 2014-01-09 ENCOUNTER — Ambulatory Visit (INDEPENDENT_AMBULATORY_CARE_PROVIDER_SITE_OTHER): Payer: BC Managed Care – PPO | Admitting: Sports Medicine

## 2014-01-09 ENCOUNTER — Encounter: Payer: Self-pay | Admitting: Sports Medicine

## 2014-01-09 VITALS — BP 118/76 | HR 84 | Ht 63.0 in | Wt 135.0 lb

## 2014-01-09 DIAGNOSIS — M5020 Other cervical disc displacement, unspecified cervical region: Secondary | ICD-10-CM | POA: Diagnosis not present

## 2014-01-09 DIAGNOSIS — M546 Pain in thoracic spine: Secondary | ICD-10-CM

## 2014-01-09 NOTE — Assessment & Plan Note (Signed)
Left facet joints lock in thoracic region  Work on position and scap stab

## 2014-01-09 NOTE — Progress Notes (Signed)
   Subjective:    Patient ID: Michelle Sims, female    DOB: 1929-10-27, 80 y.o.   MRN: [id number]  HPI   Left back pain: Patient presents to [facility] today for recurrent left upper back pain. Patient has a history of cervical spondylolysis, lumbar degenerative disc disease and osteoarthritis. Her pain is located left subscapular area. She states that it has been intermittent for years, but over the last few weeks it has progressively become worse. It is especially worse in the evenings, and with certain movements. She states the pain is a stabbing pain, that improves when she sits or lies down. She had been evaluated for this previously and it was felt it was cervical radiculopathy, and was given tramadol and gabapentin as well as exercises. She recently underwent a right knee replacement for osteoarthritis and is performing physical therapy. she does not desire medications today.  Past Medical History  Diagnosis Date  . Hypertension   . Blood type, Rh negative   . Endocervical polyp   . Parkinson disease   . Osteopenia 05/2013    T score -1.5  . Insomnia   . Anxiety   . IBS (irritable bowel syndrome)   . GERD (gastroesophageal reflux disease)   . Diverticulosis   . Lymphedema   . Chronic fatigue   . Hypothyroidism   . Depression   . Neuromuscular disorder     Parkinson's disease   . Arthritis     knee & back   . Thyroid cancer   . Breast cancer   . RBBB 10/07/2013    Allergies  Allergen Reactions  . Biaxin [Clarithromycin] Other (See Comments)    Pt does not remember reaction   . Ciprofloxacin Nausea And Vomiting  . Hctz [Hydrochlorothiazide] Other (See Comments)    weakness and hyponatremia  . Macrobid [Nitrofurantoin] Nausea Only  . Prednisone Other (See Comments)    Swelling and facial redness   . Sulfamethoxazole-Trimethoprim Other (See Comments)    Sore throat    Review of Systems Per HPI    Objective:   Physical Exam BP 118/76 mmHg  Pulse 84  Ht 5\' 3"  (1.6  m)  Wt 135 lb (61.236 kg)  BMI 23.92 kg/m2 Gen: Very pleasant, Caucasian elderly female, no acute distress, nontoxic in appearance, well-developed, well-nourished. MSK: No erythema, no edema, no soft tissue swelling or bruising over affected area. Levoscoliosis. Right shoulder drop. Mildly TTP left T5 rib shaft, adjacent to scapula. Difficult to assess range of motion of arms secondary to rotator cuff injury affecting that side as well. Moderate/severe decrease ROM  neck extension, side bending and rotation. Decreased ROM rotation and side bending of back.      Assessment & Plan:  Michelle Sims is a 80 y.o. female with left upper back pain, likely due to cervical radiculopathy. - Exercises given to patient today included back stretches, external rotation, standing rowing, and rotation of back.  Goal is scap stabilization and increased mobility of thoracic spine. Patient is to perform these 5 repetitions, holding each for 3 seconds.  Exercizes were explained to her in detail today. - Patient is to wear her neck brace when possible, especially in the evening hours. - Klonopin at night, this has been prescribed to her by her PCP - Follow-up as needed

## 2014-01-09 NOTE — Assessment & Plan Note (Signed)
See current note  Pain is positional and this suggests we need to work on posture and neck forward position

## 2014-01-09 NOTE — Patient Instructions (Signed)
Perform the following exercises once daily, 5 repetitions holding each for 3 seconds of the following: 1. Perform the back stretches: leaning against the wall, in a standing position with shoulder blades and head pushed against the wall.  2. External rotation of the arms: hold arms bent at to each side, shoulder level,  and pull back. 3. Rowing motion: bend  Arms, at hip level (jogging like motion), and pull your arms back like a rowing motion.  4. While standing, rotate upper body  over left hip and then rotate over right hip, keeping your hips steady.   Wear your neck brace, especially at night.  Take your klonopin at night as needed.  Follow up as needed.

## 2014-01-14 ENCOUNTER — Ambulatory Visit (INDEPENDENT_AMBULATORY_CARE_PROVIDER_SITE_OTHER): Payer: BC Managed Care – PPO | Admitting: Neurology

## 2014-01-14 ENCOUNTER — Encounter: Payer: Self-pay | Admitting: Neurology

## 2014-01-14 VITALS — BP 126/75 | HR 80 | Temp 97.5°F | Ht 63.0 in | Wt 136.0 lb

## 2014-01-14 DIAGNOSIS — G20 Parkinson's disease: Secondary | ICD-10-CM

## 2014-01-14 DIAGNOSIS — C50911 Malignant neoplasm of unspecified site of right female breast: Secondary | ICD-10-CM

## 2014-01-14 DIAGNOSIS — Z96651 Presence of right artificial knee joint: Secondary | ICD-10-CM

## 2014-01-14 MED ORDER — CARBIDOPA-LEVODOPA 25-100 MG PO TABS: 1.0000 | ORAL_TABLET | Freq: Three times a day (TID) | ORAL | Status: DC

## 2014-01-14 NOTE — Patient Instructions (Signed)
I would like for you to drink more water, and continue your walking exercises.  We will continue your Sinemet at the current dose.

## 2014-01-14 NOTE — Progress Notes (Signed)
Subjective:    Patient ID: Michelle Sims is a 80 y.o. female.  HPI     Interim history:   Michelle Sims is a very pleasant 80 year old right-handed woman an underlying medical history of chronic back pain, hypertension, thyroid cancer, breast cancer, status post thyroidectomy, tonsillectomy, appendectomy, tubal ligation and mastectomy with reconstruction, who presents for followup consultation of Michelle Sims parkinsonism, probable right-sided predominant Parkinson's disease of the akinetic-rigid type. She is accompanied by Michelle Sims husband again today. I last saw Michelle Sims on 07/30/2013, at which time she felt Michelle Sims PD symptoms were stable. She was taking Sinemet 1 pill 3 times a day. She was on half a pill of clonazepam, 0.25 mg each night. She was on Cymbalta 30 mg daily per Michelle Sims psychiatrist, Dr. Caprice Beaver. She had finished Michelle Sims study at [facility]. I asked Michelle Sims to start using a cane for safety. I did not make any changes to Michelle Sims medications at the time.   Today, she reports that she has done well after Michelle Sims R TKA in September and is doing PT with Dr. Damita Dunnings office. She is taking C/L tid, one at 6 or 6:30, 1 hour before lunch and 1 hour before dinner. Michelle Sims posture is worse. She overall feels stable. She feels confused sometimes.   I first met Michelle Sims on 01/30/2013, at which time it felt Michelle Sims exam was stable. She had participated in a study at [facility] which included taking a DaT scan. She had been taking Sinemet 3 times a day but was taking it with Michelle Sims meals. The only thing I suggested at the time of Michelle Sims first visit was to take the Sinemet the way from Michelle Sims mealtimes to ensure better absorption. In the interim, Michelle Sims husband called back stating that she had been experiencing some nausea. Michelle Sims primary care physician reduced Michelle Sims Sinemet to half a pill 3 times a day. I talked to the patient in April and suggested that she continue with half a pill 3 times a day and try taking a whole pill 3 times a day if she noted an increase in Michelle Sims parkinsonian  symptoms.   She previously followed with Dr. Morene Antu and was last seen by him on 12/06/2011, at which time he recommended a DaT scan and a trial of Sinemet to help differentiate Michelle Sims symptoms and help diagnose Parkinson's disease. She was advised that before she could have a DaT scan she would have to come off of Celexa. She did not end up having that test, as she enrolled in a study at [place]. She was supposed to followup after a couple of months, but did not. However, in the interim she started going to [employer] and got into a clinical trial for Parkinson's there. She was placed on Requip, then Sinemet. Requip caused severe sleepiness. She finished the study at [facility] under Dr. Maxine Glenn and as part of the study, she had 2 DaT scans. She did not take any study drugs. She reported severe R knee pain and had injections into Michelle Sims knee. She may need a knee replacement surgery, but is afraid. She goes to [employer] at [employer]. She lives at [place] with Michelle Sims husband in independent living and still drives. Michelle Sims husband states she is a good driver. She has fatigue. She has seen Dr. Oneida Alar in sports medicine for Michelle Sims arthritis.   She was initially seen by Dr. Erling Cruz on 05/15/2002 for pain in Michelle Sims hips with evidence of lumbar spinal radiculopathy. She had degenerative disc disease. MRI L-spine from 05/09/02 showed  evidence of an HNP at L3-4 centrally and to the left extending into the foramen and compressing the left L3 nerve root. She did well with conservative therapy. She has a long-standing history of back pain and was treated by Dr. Niel Hummer, who recommended she use amitriptyline. She has a over a 5 year history of deteriorating handwriting. Michelle Sims balance has been declining as well. She has to hold onto the rail, going up and down steps. She has fallen infrequently. Michelle Sims husband and Michelle Sims son started noticing that she shuffles Michelle Sims feet while walking. She started complaining of fatigue and lack of energy. She has remained  fairly independent in Michelle Sims ADLs. She has problems with Michelle Sims memory. She denies a family history of tremor. She has long-standing loss of sense of smell. She does not have constipation or acting out dreams. She denies bowel or bladder incontinence. She has been seen by Dr. Tamala Julian for palpitations. CMP,CBC, TSH, T4, and B12 were normal in 2011 and 2012. She had been on doses of vitamin B6 200 to 100 mg per day. Michelle Sims vitamin B6 level 08/13/2010 was greater than 100. Michelle Sims B12 was 695. She discontinued B6. EMG/NCV 8./16/12 was normal except for lumbosacral deinnervation, compatible with lumbosacral radiculopathy. MRI brain from 08/26/2010 showed chronic microvascular changes and mild atrophy. She has seen Dr. Casimiro Needle for depression and was changed from citalopram to sertraline 50 mg daily without any benefit and then started seeing Dr. Harley Alto at [facility]. She went off of tramadol because of the possibility of serotonergic syndrome, but restarted it. She has been in counseling. She denies numbness in Michelle Sims feet and legs. She describes involuntary movements. She has right knee pain. Basic metabolic panel normal 05/28/98. A course of physical therapy for Michelle Sims gait did not help.   Michelle Sims Past Medical History Is Significant For: Past Medical History  Diagnosis Date  . Hypertension   . Blood type, Rh negative   . Endocervical polyp   . Parkinson disease   . Osteopenia 05/2013    T score -1.5  . Insomnia   . Anxiety   . IBS (irritable bowel syndrome)   . GERD (gastroesophageal reflux disease)   . Diverticulosis   . Lymphedema   . Chronic fatigue   . Hypothyroidism   . Depression   . Neuromuscular disorder     Parkinson's disease   . Arthritis     knee & back   . Thyroid cancer   . Breast cancer   . RBBB 10/07/2013    Michelle Sims Past Surgical History Is Significant For: Past Surgical History  Procedure Laterality Date  . Breast surgery  1979    MASTECTOMY AND RECONSTRUCTION  . Appendectomy  1979  . Tubal ligation     . Dilation and curettage of uterus      MISCARRIAGE  . Tonsillectomy    . Hysteroscopy  [id number]    AND D&C  . Thyroidectomy    . Mastectomy Bilateral   . Eye surgery Bilateral     w/ IOL  . Total knee arthroplasty Right 10/07/2013    Procedure: RIGHT TOTAL KNEE ARTHROPLASTY;  Surgeon: Kerin Salen, MD;  Location: [facility];  Service: Orthopedics;  Laterality: Right;    Michelle Sims Family History Is Significant For: Family History  Problem Relation Age of Onset  . Hypertension Mother   . Hyperlipidemia Mother   . Hypertension Father   . Hyperlipidemia Father   . Hyperlipidemia Brother   . Hypertension Brother   . Heart  failure Mother   . Heart failure Father     Michelle Sims Social History Is Significant For: History   Social History  . Marital Status: Married    Spouse Name: N/A    Number of Children: N/A  . Years of Education: N/A   Social History Main Topics  . Smoking status: Never Smoker   . Smokeless tobacco: Never Used  . Alcohol Use: Yes     Comment: rare  . Drug Use: No  . Sexual Activity: No   Other Topics Concern  . None   Social History Narrative    Michelle Sims Allergies Are:  Allergies  Allergen Reactions  . Biaxin [Clarithromycin] Other (See Comments)    Pt does not remember reaction   . Ciprofloxacin Nausea And Vomiting  . Hctz [Hydrochlorothiazide] Other (See Comments)    weakness and hyponatremia  . Macrobid [Nitrofurantoin] Nausea Only  . Other Nausea Only  . Prednisone Other (See Comments)    Swelling and facial redness   . Sulfamethoxazole-Trimethoprim Other (See Comments) and Rash    Sore throat  :   Michelle Sims Current Medications Are:  Outpatient Encounter Prescriptions as of 01/14/2014  Medication Sig  . acetaminophen (TYLENOL) 500 MG tablet Take 1,000 mg by mouth every 6 (six) hours as needed.  Marland Kitchen amLODipine (NORVASC) 2.5 MG tablet Take 2.5 mg by mouth daily.  Marland Kitchen aspirin EC 325 MG tablet Take 1 tablet (325 mg total) by mouth 2 (two) times daily.  .  Calcium Carb-Cholecalciferol (CALCIUM 600 + D PO) Take 1 tablet by mouth daily with lunch.  . carbidopa-levodopa (SINEMET IR) 25-100 MG per tablet Take 1 tablet by mouth 3 (three) times daily before meals.  . Cholecalciferol (VITAMIN D3) 2000 UNITS TABS Take 2,000 Units by mouth daily after lunch.   . clonazePAM (KLONOPIN) 0.5 MG tablet Take 0.25 mg by mouth at bedtime.   . Cranberry (SM CRANBERRY) 300 MG tablet Take 300 mg by mouth daily.  . DULoxetine (CYMBALTA) 30 MG capsule Take 30 mg by mouth daily with supper.   Marland Kitchen ibuprofen (ADVIL,MOTRIN) 200 MG tablet Take 400 mg by mouth every 6 (six) hours as needed.  . irbesartan (AVAPRO) 75 MG tablet Take 75 mg by mouth at bedtime.   Marland Kitchen levofloxacin (LEVAQUIN) 500 MG tablet   . levothyroxine (SYNTHROID, LEVOTHROID) 112 MCG tablet Take 112 mcg by mouth daily before breakfast.  . methenamine (HIPREX) 1 G tablet Take 1 g by mouth daily after breakfast.   . Multiple Vitamin (MULTIVITAMIN WITH MINERALS) TABS tablet Take 1 tablet by mouth daily. Women's Over 62  . nitrofurantoin, macrocrystal-monohydrate, (MACROBID) 100 MG capsule   . NONFORMULARY OR COMPOUNDED ITEM Insert 1 ml (1 syringe) into the vagina at bedtime 3 times a week as directed.  . Probiotic Product (ALIGN) 4 MG CAPS Take 4 mg by mouth daily with lunch.   . raloxifene (EVISTA) 60 MG tablet TAKE 1 TABLET (60 MG TOTAL) BY MOUTH DAILY.  . vitamin C (ASCORBIC ACID) 500 MG tablet Take 500 mg by mouth daily.  . vitamin C (ASCORBIC ACID) 500 MG tablet Take by mouth.  . [DISCONTINUED] amLODipine (NORVASC) 5 MG tablet Take 5 mg by mouth at bedtime.   . [DISCONTINUED] cephALEXin (KEFLEX) 500 MG capsule   . [DISCONTINUED] oxyCODONE-acetaminophen (ROXICET) 5-325 MG per tablet Take 1 tablet by mouth every 4 (four) hours as needed. (Patient not taking: Reported on 01/14/2014)  . [DISCONTINUED] tizanidine (ZANAFLEX) 2 MG capsule Take 1 capsule (2 mg total) by  mouth 3 (three) times daily. (Patient not  taking: Reported on 01/14/2014)  . [DISCONTINUED] tobramycin-dexamethasone (TOBRADEX) ophthalmic solution   . [DISCONTINUED] traMADol (ULTRAM) 50 MG tablet Take 1 tablet (50 mg total) by mouth every 6 (six) hours as needed. (Patient not taking: Reported on 01/14/2014)  :  Review of Systems:  Out of a complete 14 point review of systems, all are reviewed and negative with the exception of these symptoms as listed below:   Review of Systems  All other systems reviewed and are negative.   Objective:  Neurologic Exam  Physical Exam Physical Examination:   Filed Vitals:   01/14/14 1245  BP: 126/75  Pulse: 80  Temp: 97.5 F (36.4 C)   General Examination: The patient is a very pleasant 80 y.o. female in no acute distress.  HEENT: Normocephalic, atraumatic, pupils are equal, round and reactive to light and accommodation. Extraocular tracking shows minimal saccadic breakdown without nystagmus noted. There is no limitation to Michelle Sims gaze. There is mild decrease in eye blink rate. Hearing is intact. Face is symmetric with minimal facial masking and normal facial sensation. There is no lip, neck or jaw tremor. Neck is mildly rigid with intact passive ROM. There are no carotid bruits on auscultation. Oropharynx exam reveals mild mouth dryness. No significant airway crowding is noted. Mallampati is class II. Tongue protrudes centrally and palate elevates symmetrically. There is no drooling.  R shoulder is lower than L.   Chest: is clear to auscultation without wheezing, rhonchi or crackles noted.  Heart: sounds are regular and normal without murmurs, rubs or gallops noted.   Abdomen: is soft, non-tender and non-distended with normal bowel sounds appreciated on auscultation.  Extremities: There is no pitting edema in the distal lower extremities bilaterally. Pedal pulses are intact. There are no varicose veins.  Skin: is warm and dry with no trophic changes noted. Age-related changes are noted  on the skin. She has a spot on the dorsum of the right hand which is healing. She says she bumped Michelle Sims hand and got a cut which was slow to heal. It does not look sinister and has a scab on it.  Musculoskeletal: exam reveals no obvious joint deformities, tenderness, joint swelling or erythema, except for mild R knee swelling, but a well healing scar. She has mild swelling of the R arm.   Neurologically:  Mental status: The patient is awake and alert, paying good  attention. She is able to completely provide the history. Michelle Sims husband provides details. She is oriented to: person, place, time/date, situation, day of week, month of year and year. Michelle Sims memory, attention, language and knowledge are fairly intact. There is no aphasia, agnosia, apraxia or anomia. There is a mild degree of bradyphrenia. Speech is mildly hypophonic with no dysarthria noted. Mood is congruent and affect is normal.    Cranial nerves are as described above under HEENT exam. In addition, shoulder shrug is normal with equal shoulder height noted.  Motor exam: Normal bulk, and strength for age is noted. There are no dyskinesias noted. Tone is very mildly rigid really only in the right upper extremity with absence of cogwheeling. There is overall mild bradykinesia. There is no drift or rebound.  There is no tremor.   Romberg is negative. a Reflexes are 1+ in the upper extremities and 1+ in the lower extremities.   Fine motor skills exam: Finger taps are mildly impaired on the right and minimally impaired on the left. Hand movements are mildly impaired  on the right and minimally impaired on the left. RAP (rapid alternating patting) is minimally impaired on the right and not impaired on the left. Foot taps are mildly impaired on the right and minimally impaired on the left. Foot agility (in the form of heel stomping) is minimally impaired on the right and not impaired on the left.    Cerebellar testing shows no dysmetria or intention  tremor on finger to nose testing. Heel to shin is difficult for Michelle Sims d/t Michelle Sims R knee. There is no truncal or gait ataxia.   Sensory exam is intact to light touch, pinprick, vibration, temperature sense in the upper and lower extremities.   Gait, station and balance: She stands up from the seated position with mild difficulty and does need to push up with Michelle Sims hands. She needs no assistance. No veering to one side is noted. She c/o pain in Michelle Sims R knee pain and stiffness when she first stands up. She is not noted to lean to the side. Posture is mildly stooped, but could be age-appropriate. Stance is wide-based, unchanged. She walks with decrease in stride length and pace and decrease in R arm swing. She walks somewhat cautiously and has a limp on the right. She turns in 3 steps. Tandem walk is not possible. Balance is mildly impaired.      Assessment and Plan:    In summary, Michelle Sims is a very pleasant 80 year old female with a history of parkinsonism, most likely R sided predominant Parkinson's disease, akinetic-rigid type. Michelle Sims physical exam is for the most part stable. All in all she continues to have milder findings. She has done rather well after Michelle Sims recent knee replacement surgery and am very pleased to see that. She still and outpatient physical therapy and will soon resume water therapy in the pool. She took part in a study for Parkinson's disease at [facility] which she has finished. I suggested we continue with Sinemet at the current dose. I talked to Michelle Sims and Michelle Sims husband at length today about Michelle Sims diagnosis, its prognosis and treatment options. We talked about medical treatments and non-pharmacological approaches. We talked about maintaining a healthy lifestyle in general. I encouraged the patient to eat healthy, exercise daily and keep well hydrated, to keep a scheduled bedtime and wake time routine, to not skip any meals and eat healthy snacks in between meals and to have protein with every meal. In  particular, I stressed the importance of regular exercise, within of course the patient's own mobility limitations. I renewed Michelle Sims Sinemet prescription for 90 day supply with refills. I would like to see Michelle Sims back in 4 months. They had multiple questions today which I answered.   Most of my 40 minute visit today was spent in counseling and coordination of care, reviewing test results and reviewing medication.

## 2014-01-15 ENCOUNTER — Ambulatory Visit: Payer: BC Managed Care – PPO | Admitting: Sports Medicine

## 2014-01-21 ENCOUNTER — Telehealth: Payer: Self-pay | Admitting: *Deleted

## 2014-01-21 MED ORDER — TRAMADOL HCL 50 MG PO TABS: 50.0000 mg | ORAL_TABLET | Freq: Four times a day (QID) | ORAL | Status: DC | PRN

## 2014-01-21 NOTE — Telephone Encounter (Signed)
Doing PT. Got a pinch nerve in glut. PA told her to take Tramadol with Nap[place]. Checked with Dr. Micheline Chapman. He agreed, but wait to take Tramadol 1 to 2 hours after Naporson. She agreed but if not better will call to make appt. With Dr. Oneida Alar

## 2014-01-30 ENCOUNTER — Ambulatory Visit: Payer: BC Managed Care – PPO | Admitting: Neurology

## 2014-03-21 ENCOUNTER — Telehealth: Payer: Self-pay | Admitting: *Deleted

## 2014-03-21 NOTE — Telephone Encounter (Signed)
error 

## 2014-03-25 ENCOUNTER — Ambulatory Visit (INDEPENDENT_AMBULATORY_CARE_PROVIDER_SITE_OTHER): Payer: BLUE CROSS/BLUE SHIELD | Admitting: Sports Medicine

## 2014-03-25 ENCOUNTER — Encounter: Payer: Self-pay | Admitting: Sports Medicine

## 2014-03-25 VITALS — BP 143/82 | HR 74 | Ht 63.0 in | Wt 130.0 lb

## 2014-03-25 DIAGNOSIS — M5137 Other intervertebral disc degeneration, lumbosacral region: Secondary | ICD-10-CM

## 2014-03-25 DIAGNOSIS — R269 Unspecified abnormalities of gait and mobility: Secondary | ICD-10-CM | POA: Insufficient documentation

## 2014-03-25 NOTE — Patient Instructions (Signed)
Back stretches in a chair:  Flex forward gradually moving your arms down the front of your legs. This was stretch her low back. Do this 5 times holding each time for approximately 10 seconds Extending back with arms outstretched teach side holding for 5-10 seconds repeating 5 times  Hip exercises: Range of motion with moving hip backwards and forwards in a circular motion doing this approximately 10 times in each direction

## 2014-03-25 NOTE — Progress Notes (Signed)
  Michelle Sims - 81 y.o. female MRN [id number]  Date of birth: 08-18-1929  SUBJECTIVE:  Including CC & ROS.  Patient is an 81 year old wasn't female presenting today complaining of bilateral lower back pain radiates into her buttocks and posterior legs. Patient reports difficulty getting up from a seated position with weakness in back extension as well as hip motion. Denies any numbness or tingling into her legs or thighs. No symptoms and pain at night. Other physicians attempted to treat with prednisone was discontinued because of side effects. She was also given naproxen but has not been taking this medication. She's been participating in water aerobics and also doing her home exercises status post right total knee replacement with no center via difficulty. Only exercise it bothers her knees knee flexion. Implants to follow-up with her orthopedist within the next few weeks.   ROS: Review of systems otherwise negative except for information present in HPI  HISTORY: Past Medical, Surgical, Social, and Family History Reviewed & Updated per EMR. Pertinent Historical Findings include: Degenerative disc disease in the cervical spine with history of radiculopathy Chronic thoracic and lumbar back pain Hypertension Hypothyroidism Parkinson's disease  DATA REVIEWED: No previous imaging of patient's lumbar spine is available for review  PHYSICAL EXAM:  VS: BP:(!) 143/82 mmHg  HR:74bpm  TEMP: ( )  RESP:   HT:5\' 3"  (160 cm)   WT:130 lb (58.968 kg)  BMI:23.1 LOW BACK EXAM: General: well nourished Skin of LE: warm; dry, no rashes, lesions, ecchymosis or erythema. Vascular: radial pulses 2+ bilaterally Neurologically: Sensation to light touch lower extremities equal and intact bilaterally.  Observation: Abnormal curvature with loss of lordosis of the lumbar spine and scoliosis of the thoracic.  Palpation:  No step off defects noted in the thoracic or lumbar spine.   Bilateral muscle spasm of the  paraspinal musculature of the thoracic and lumbar spine. Range of motion:  Decreased range of motion with flexion extension rotation and side bending of the lumbar spine related to history of Parkinson's Neuromuscular: Shuffled gait Nerve root intervention  L2 and L3: Normal hip flexion with no weakness. L2, L3, L4: Normal hip abduction bilaterally.   L4, L5, S1: Normal hip abduction bilaterally S1 and S2: Normal ankle plantar-flexion bilaterally.   L5: Normal extensor hallucis longus bilaterally  ASSESSMENT & PLAN: See problem based charting & AVS for pt instructions. Impression: Lumbar back pain related to degenerative changes in the lumbar spine as well as history of scoliosis no symptoms of radiculopathy  Recommendations: -Provided patient with some home exercises including chair flexion extension stretches -Also recommended some hip rotation exercises -Educated patient and husband at bedside that her Parkinson is likely contributing to some of her decreased motion and slow movements such as getting up from a chair. -Patient can use naproxen or Tumeric capsules twice a day as needed for pain control -And continuing water aerobics and knee exercises but avoiding deep knee flexion as this causes some pain until patient follows up with her orthopedic surgeon. - F/U PRN

## 2014-03-26 ENCOUNTER — Ambulatory Visit (INDEPENDENT_AMBULATORY_CARE_PROVIDER_SITE_OTHER): Payer: BLUE CROSS/BLUE SHIELD | Admitting: Gynecology

## 2014-03-26 ENCOUNTER — Encounter: Payer: Self-pay | Admitting: Gynecology

## 2014-03-26 VITALS — BP 122/74

## 2014-03-26 DIAGNOSIS — R300 Dysuria: Secondary | ICD-10-CM

## 2014-03-26 DIAGNOSIS — N3000 Acute cystitis without hematuria: Secondary | ICD-10-CM

## 2014-03-26 LAB — URINALYSIS W MICROSCOPIC + REFLEX CULTURE
Bilirubin Urine: NEGATIVE
Casts: NONE SEEN
Crystals: NONE SEEN
Glucose, UA: NEGATIVE mg/dL
KETONES UR: NEGATIVE mg/dL
Nitrite: NEGATIVE
Protein, ur: 30 mg/dL — AB
SPECIFIC GRAVITY, URINE: 1.025 (ref 1.005–1.030)
UROBILINOGEN UA: 0.2 mg/dL (ref 0.0–1.0)
pH: 6.5 (ref 5.0–8.0)

## 2014-03-26 MED ORDER — NITROFURANTOIN MONOHYD MACRO 100 MG PO CAPS: 100.0000 mg | ORAL_CAPSULE | Freq: Two times a day (BID) | ORAL | Status: DC

## 2014-03-26 NOTE — Patient Instructions (Signed)
Take the antibiotic pill twice daily for 7 days. Call if your symptoms persist or worsen.  Urinary Tract Infection Urinary tract infections (UTIs) can develop anywhere along your urinary tract. Your urinary tract is your body's drainage system for removing wastes and extra water. Your urinary tract includes two kidneys, two ureters, a bladder, and a urethra. Your kidneys are a pair of bean-shaped organs. Each kidney is about the size of your fist. They are located below your ribs, one on each side of your spine. CAUSES Infections are caused by microbes, which are microscopic organisms, including fungi, viruses, and bacteria. These organisms are so small that they can only be seen through a microscope. Bacteria are the microbes that most commonly cause UTIs. SYMPTOMS  Symptoms of UTIs may vary by age and gender of the patient and by the location of the infection. Symptoms in young women typically include a frequent and intense urge to urinate and a painful, burning feeling in the bladder or urethra during urination. Older women and men are more likely to be tired, shaky, and weak and have muscle aches and abdominal pain. A fever may mean the infection is in your kidneys. Other symptoms of a kidney infection include pain in your back or sides below the ribs, nausea, and vomiting. DIAGNOSIS To diagnose a UTI, your caregiver will ask you about your symptoms. Your caregiver also will ask to provide a urine sample. The urine sample will be tested for bacteria and white blood cells. White blood cells are made by your body to help fight infection. TREATMENT  Typically, UTIs can be treated with medication. Because most UTIs are caused by a bacterial infection, they usually can be treated with the use of antibiotics. The choice of antibiotic and length of treatment depend on your symptoms and the type of bacteria causing your infection. HOME CARE INSTRUCTIONS  If you were prescribed antibiotics, take them  exactly as your caregiver instructs you. Finish the medication even if you feel better after you have only taken some of the medication.  Drink enough water and fluids to keep your urine clear or pale yellow.  Avoid caffeine, tea, and carbonated beverages. They tend to irritate your bladder.  Empty your bladder often. Avoid holding urine for long periods of time.  Empty your bladder before and after sexual intercourse.  After a bowel movement, women should cleanse from front to back. Use each tissue only once. SEEK MEDICAL CARE IF:   You have back pain.  You develop a fever.  Your symptoms do not begin to resolve within 3 days. SEEK IMMEDIATE MEDICAL CARE IF:   You have severe back pain or lower abdominal pain.  You develop chills.  You have nausea or vomiting.  You have continued burning or discomfort with urination. MAKE SURE YOU:   Understand these instructions.  Will watch your condition.  Will get help right away if you are not doing well or get worse. Document Released: 10/20/2004 Document Revised: 07/12/2011 Document Reviewed: 02/18/2011 [facility] Patient Information 2015 [place], [place]. This information is not intended to replace advice given to you by your health care provider. Make sure you discuss any questions you have with your health care provider.

## 2014-03-26 NOTE — Progress Notes (Signed)
Michelle Sims 03-24-1929 [id number]        81 y.o.  [id number] presents with a one-day history of discomfort when urinating. Mild suprapubic discomfort. No low back pain. No fever chills nausea vomiting. Also notes some vaginal burning but that actually seems to be better now. Is using estradiol vaginal cream twice weekly. No discharge itching or odor.  Past medical history,surgical history, problem list, medications, allergies, family history and social history were all reviewed and documented in the EPIC chart.  Directed ROS with pertinent positives and negatives documented in the history of present illness/assessment and plan.  Exam: Kim assistant Filed Vitals:   03/26/14 1055  BP: 122/74   General appearance:  Normal Spine straight without CVA tenderness Abdomen soft nontender without masses guarding rebound Pelvic external BUS vagina with atrophic changes.  Somewhat foreshortened with upper vaginal restriction. Cervix not clearly visualized. Second-degree rectocele noted. Bimanual without masses or tenderness  Assessment/Plan:  81 y.o. [id number] with symptoms and urinalysis consistent with UTI. Will treat with Macrobid bid 100 mg twice a day 7 days. She is listed as an allergy with nausea but states that she has taken Macrodantin pass and did okay with it. No evidence of vaginal infection. She'll continue with her estradiol cream and follow up if her symptoms continue or worsen.     [name] MD, 11:15 AM 03/26/2014

## 2014-03-28 LAB — URINE CULTURE: Colony Count: >=100000

## 2014-04-10 ENCOUNTER — Ambulatory Visit: Payer: BLUE CROSS/BLUE SHIELD | Admitting: Neurology

## 2014-04-14 ENCOUNTER — Ambulatory Visit: Payer: BLUE CROSS/BLUE SHIELD | Admitting: Neurology

## 2014-04-17 ENCOUNTER — Ambulatory Visit (INDEPENDENT_AMBULATORY_CARE_PROVIDER_SITE_OTHER): Payer: BLUE CROSS/BLUE SHIELD | Admitting: Neurology

## 2014-04-17 ENCOUNTER — Encounter: Payer: Self-pay | Admitting: Neurology

## 2014-04-17 VITALS — BP 122/70 | HR 78 | Temp 98.7°F | Resp 14 | Ht 63.0 in | Wt 136.0 lb

## 2014-04-17 DIAGNOSIS — C50911 Malignant neoplasm of unspecified site of right female breast: Secondary | ICD-10-CM | POA: Diagnosis not present

## 2014-04-17 DIAGNOSIS — Z96651 Presence of right artificial knee joint: Secondary | ICD-10-CM | POA: Diagnosis not present

## 2014-04-17 DIAGNOSIS — M25552 Pain in left hip: Secondary | ICD-10-CM

## 2014-04-17 DIAGNOSIS — G20 Parkinson's disease: Secondary | ICD-10-CM | POA: Diagnosis not present

## 2014-04-17 MED ORDER — CARBIDOPA-LEVODOPA 25-100 MG PO TABS: 1.0000 | ORAL_TABLET | Freq: Four times a day (QID) | ORAL | Status: DC

## 2014-04-17 NOTE — Patient Instructions (Signed)
I would like for you to try increasing Sinemet 25/100 mg to 1 pill 4 times a day: take at 7 AM, 11 AM, 3 PM, and 8 PM. In the first few weeks, you may feel a little more fatigued with the increase in the medication.

## 2014-04-17 NOTE — Progress Notes (Signed)
Subjective:    Patient ID: Michelle Sims is a 81 y.o. female.  HPI     Interim history:   Michelle Sims is a very pleasant 81 year old right-handed woman an underlying medical history of chronic back pain, hypertension, thyroid cancer, breast cancer, status post thyroidectomy, tonsillectomy, appendectomy, tubal ligation and mastectomy with reconstruction, who presents for followup consultation of her parkinsonism, probable right-sided predominant Parkinson's disease of the akinetic-rigid type. She is accompanied by her husband again today. I last saw her on 01/14/2014, at which time she reported doing well after her right total knee replacement in September 2015 and she was in physical therapy through her orthopedics office. She was on Sinemet 3 times a day. She did notice that her posture was worse. Sometimes she had confusion. I did not make medication changes at the time. She presents for a sooner than scheduled appointment secondary to more fatigue and balance issues.  Today, 04/17/2014: She is able to provide her history and her husband supplements her history. She has more freezing and start hesitation. Thankfully, she has not fallen recently but did trip in the bedroom over a shoe. She did not hurt herself. She feels fatigued a lot. She may not drink enough water. She feels hungry a lot in between. She takes her medicines 3 times a day, one hour before her meals but mealtimes vary and so does her medication time. She takes water aerobic classes 3 times a week for 45 minutes. She is in exercise classes on those other 2 days in the week. She tries to walk regularly. She also does the exercises her orthopedic has suggested. She has had bilateral hip pain, left more than right. Her orthopedic doctor believes that she has a pinched nerve she says. She feels more stiff.  Previously:  I saw her on 07/30/2013, at which time she felt her PD symptoms were stable. She was taking Sinemet 1 pill 3 times a  day. She was on half a pill of clonazepam, 0.25 mg each night. She was on Cymbalta 30 mg daily per her psychiatrist, Dr. Caprice Beaver. She had finished her study at [facility]. I asked her to start using a cane for safety. I did not make any changes to her medications at the time.   I first met her on 01/30/2013, at which time it felt her exam was stable. She had participated in a study at [facility] which included taking a DaT scan. She had been taking Sinemet 3 times a day but was taking it with her meals. The only thing I suggested at the time of her first visit was to take the Sinemet the way from her mealtimes to ensure better absorption. In the interim, her husband called back stating that she had been experiencing some nausea. Her primary care physician reduced her Sinemet to half a pill 3 times a day. I talked to the patient in April and suggested that she continue with half a pill 3 times a day and try taking a whole pill 3 times a day if she noted an increase in her parkinsonian symptoms.   She previously followed with Dr. Morene Antu and was last seen by him on 12/06/2011, at which time he recommended a DaT scan and a trial of Sinemet to help differentiate her symptoms and help diagnose Parkinson's disease. She was advised that before she could have a DaT scan she would have to come off of Celexa. She did not end up having that test, as she enrolled  in a study at [facility]. She was supposed to followup after a couple of months, but did not. However, in the interim she started going to [employer] and got into a clinical trial for Parkinson's there. She was placed on Requip, then Sinemet. Requip caused severe sleepiness. She finished the study at [facility] under Dr. Maxine Glenn and as part of the study, she had 2 DaT scans. She did not take any study drugs. She reported severe R knee pain and had injections into her knee. She may need a knee replacement surgery, but is afraid. She goes to [employer] at [employer]. She lives at  [place] with her husband in independent living and still drives. Her husband states she is a good driver. She has fatigue. She has seen Dr. Oneida Alar in sports medicine for her arthritis.   She was initially seen by Dr. Erling Cruz on 05/15/2002 for pain in her hips with evidence of lumbar spinal radiculopathy. She had degenerative disc disease. 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 She did well with conservative therapy. She has a long-standing history of back pain and was treated by Dr. Niel Hummer, who recommended she use amitriptyline. She has a over a 5 year history of deteriorating handwriting. Her balance has been declining as well. She has to hold onto the rail, going up and down steps. She has fallen infrequently. Her husband and her son started noticing that she shuffles her feet while walking. She started complaining of fatigue and lack of energy. She has remained fairly independent in her ADLs. She has problems with her memory. She denies a family history of tremor. She has long-standing loss of sense of smell. She does not have constipation or acting out dreams. She denies bowel or bladder incontinence. She has been seen by Dr. Tamala Julian for palpitations. CMP,CBC, TSH, T4, and B12 were normal in 2011 and 2012. She had been on doses of vitamin B6 200 to 100 mg per day. Her vitamin B6 level 08/13/2010 was greater than 100. Her B12 was 695. She discontinued B6. EMG/NCV 8./16/12 was normal except for lumbosacral deinnervation, compatible with lumbosacral radiculopathy. MRI brain from 08/26/2010 showed chronic microvascular changes and mild atrophy. She has seen Dr. Casimiro Needle for depression and was changed from citalopram to sertraline 50 mg daily without any benefit and then started seeing Dr. Harley Alto at [facility]. She went off of tramadol because of the possibility of serotonergic syndrome, but restarted  it. She has been in counseling. She denies numbness in her feet and legs. She describes involuntary movements. She has right knee pain. Basic metabolic panel normal 6/0/45. A course of physical therapy for her gait did not help.   Her Past Medical History Is Significant For: Past Medical History  Diagnosis Date  . Hypertension   . Blood type, Rh negative   . Endocervical polyp   . Parkinson disease   . Osteopenia 05/2013    T score -1.5  . Insomnia   . Anxiety   . IBS (irritable bowel syndrome)   . GERD (gastroesophageal reflux disease)   . Diverticulosis   . Lymphedema   . Chronic fatigue   . Hypothyroidism   . Depression   . Neuromuscular disorder     Parkinson's disease   . Arthritis     knee & back   . Thyroid cancer   . Breast cancer   . RBBB  10/07/2013    Her Past Surgical History Is Significant For: Past Surgical History  Procedure Laterality Date  . Breast surgery  1979    MASTECTOMY AND RECONSTRUCTION  . Appendectomy  1979  . Tubal ligation    . Dilation and curettage of uterus      MISCARRIAGE  . Tonsillectomy    . Hysteroscopy  [id number]    AND D&C  . Thyroidectomy    . Mastectomy Bilateral   . Eye surgery Bilateral     w/ IOL  . Total knee arthroplasty Right 10/07/2013    Procedure: RIGHT TOTAL KNEE ARTHROPLASTY;  Surgeon: Kerin Salen, MD;  Location: [facility];  Service: Orthopedics;  Laterality: Right;    Her Family History Is Significant For: Family History  Problem Relation Age of Onset  . Hypertension Mother   . Hyperlipidemia Mother   . Hypertension Father   . Hyperlipidemia Father   . Hyperlipidemia Brother   . Hypertension Brother   . Heart failure Mother   . Heart failure Father     Her Social History Is Significant For: History   Social History  . Marital Status: Married    Spouse Name: N/A  . Number of Children: N/A  . Years of Education: N/A   Social History Main Topics  . Smoking status: Never Smoker   . Smokeless tobacco:  Never Used  . Alcohol Use: Yes     Comment: rare  . Drug Use: No  . Sexual Activity: No   Other Topics Concern  . None   Social History Narrative    Her Allergies Are:  Allergies  Allergen Reactions  . Biaxin [Clarithromycin] Other (See Comments)    Pt does not remember reaction   . Ciprofloxacin Nausea And Vomiting  . Hctz [Hydrochlorothiazide] Other (See Comments)    weakness and hyponatremia  . Other Nausea Only  . Prednisone Other (See Comments)    Swelling and facial redness   . Sulfamethoxazole-Trimethoprim Other (See Comments) and Rash    Sore throat  :   Her Current Medications Are:  Outpatient Encounter Prescriptions as of 04/17/2014  Medication Sig  . acetaminophen (TYLENOL) 500 MG tablet Take 1,000 mg by mouth every 6 (six) hours as needed.  Marland Kitchen amLODipine (NORVASC) 2.5 MG tablet Take 2.5 mg by mouth daily.  . Calcium Carb-Cholecalciferol (CALCIUM 600 + D PO) Take 1 tablet by mouth daily with lunch.  . carbidopa-levodopa (SINEMET IR) 25-100 MG per tablet Take 1 tablet by mouth 3 (three) times daily before meals.  . Cholecalciferol (VITAMIN D3) 2000 UNITS TABS Take 2,000 Units by mouth daily after lunch.   . clonazePAM (KLONOPIN) 0.5 MG tablet Take 0.25 mg by mouth at bedtime.   . Cranberry (SM CRANBERRY) 300 MG tablet Take 300 mg by mouth daily.  . DULoxetine (CYMBALTA) 30 MG capsule Take 30 mg by mouth daily with supper.   Marland Kitchen ibuprofen (ADVIL,MOTRIN) 200 MG tablet Take 400 mg by mouth every 6 (six) hours as needed.  . irbesartan (AVAPRO) 75 MG tablet Take 75 mg by mouth at bedtime.   Marland Kitchen levothyroxine (SYNTHROID, LEVOTHROID) 112 MCG tablet Take 112 mcg by mouth daily before breakfast.  . methenamine (HIPREX) 1 G tablet Take 1 g by mouth daily after breakfast.   . Multiple Vitamin (MULTIVITAMIN WITH MINERALS) TABS tablet Take 1 tablet by mouth daily. Women's Over 1  . NONFORMULARY OR COMPOUNDED ITEM Insert 1 ml (1 syringe) into the vagina at bedtime 3 times a  week as directed.  . raloxifene (EVISTA) 60 MG tablet TAKE 1 TABLET (60 MG TOTAL) BY MOUTH DAILY.  . vitamin C (ASCORBIC ACID) 500 MG tablet Take 500 mg by mouth daily.  . [DISCONTINUED] nitrofurantoin, macrocrystal-monohydrate, (MACROBID) 100 MG capsule Take 1 capsule (100 mg total) by mouth 2 (two) times daily. For 7 days  :  Review of Systems:  Out of a complete 14 point review of systems, all are reviewed and negative with the exception of these symptoms as listed below:   Review of Systems  Constitutional: Positive for fatigue.  Musculoskeletal:       Aching in bilateral hips  Neurological: Positive for weakness.       Has a hard time standing and walking from a sitting position, especially in the morning. Increased feeling that she will fall. Decreased balance.     Objective:  Neurologic Exam  Physical Exam Physical Examination:   Filed Vitals:   04/17/14 0827  BP: 122/70  Pulse: 78  Temp: 98.7 F (37.1 C)  Resp: 14   General Examination: The patient is a very pleasant 81 y.o. female in no acute distress.  HEENT: Normocephalic, atraumatic, pupils are equal, round and reactive to light and accommodation. Extraocular tracking shows minimal saccadic breakdown without nystagmus noted. There is no limitation to her gaze. There is mild decrease in eye blink rate. Hearing is intact. Face is symmetric with minimal facial masking and normal facial sensation. There is no lip, neck or jaw tremor. Neck is mildly rigid with intact passive ROM. There are no carotid bruits on auscultation. Oropharynx exam reveals mild to moderate mouth dryness. No significant airway crowding is noted. Mallampati is class II. Tongue protrudes centrally and palate elevates symmetrically. There is no drooling.  R shoulder is lower than L.   Chest: is clear to auscultation without wheezing, rhonchi or crackles noted.  Heart: sounds are regular and normal without murmurs, rubs or gallops noted.   Abdomen:  is soft, non-tender and non-distended with normal bowel sounds appreciated on auscultation.  Extremities: There is no pitting edema in the distal lower extremities bilaterally. Pedal pulses are intact. There are no varicose veins.  Skin: is warm and dry with no trophic changes noted. Age-related changes are noted on the skin. She has a spot on the dorsum of the right hand which is healing. She says she bumped her hand and got a cut which was slow to heal. It does not look sinister and has a scab on it.  Musculoskeletal: exam reveals no obvious joint deformities, tenderness, joint swelling or erythema, except for mild R knee swelling, but a well healing scar. She has mild swelling of the R arm.   Neurologically:  Mental status: The patient is awake and alert, paying good  attention. She is able to provide the history. Her husband provides details. She is oriented to: person, place, time/date, situation, day of week, month of year and year. Her memory, attention, language and knowledge are fairly intact. There is no aphasia, agnosia, apraxia or anomia. There is a mild degree of bradyphrenia. Speech is mildly hypophonic with no dysarthria noted. Mood is congruent and affect is normal.    Cranial nerves are as described above under HEENT exam. In addition, shoulder shrug is normal with equal shoulder height noted.  Motor exam: Normal bulk, and strength for age is noted. There are no dyskinesias noted. Tone is very mildly rigid really only in the right upper extremity with some cogwheeling. There is  overall mild bradykinesia. There is no drift or rebound.  There is no tremor.   Romberg is negative.  Reflexes are 1+ in the upper extremities and 1+ in the lower extremities.   Fine motor skills exam: Finger taps are mildly impaired on the right and minimally impaired on the left. Hand movements are mildly impaired on the right and minimally impaired on the left. RAP (rapid alternating patting) is minimally  impaired on the right and not impaired on the left. Foot taps are mildly impaired on the right and minimally impaired on the left. Foot agility (in the form of heel stomping) is minimally impaired on the right and not impaired on the left.    Cerebellar testing shows no dysmetria or intention tremor on finger to nose testing. Heel to shin is difficult for her d/t her R knee. There is no truncal or gait ataxia.   Sensory exam is intact to light touch, pinprick, vibration, temperature sense in the upper and lower extremities.   Gait, station and balance: She stands up from the seated position with mild difficulty and does need to push up with Her hands. She needs no assistance. No veering to one side is noted. She c/o pain in the left hip. She complains of some low back pain. Posture is mild to moderately stooped, little bit worse. She has a little start hesitation in a couple of episodes of freezing. She turns in 3 steps.Tandem walk is not possible. Balance is mildly impaired.      Assessment and Plan:    In summary, Michelle Sims is a very pleasant 81 year old female with a history of parkinsonism, most likely R sided predominant Parkinson's disease, akinetic-rigid type. Her physical exam is for the most part stable. All in all she has had milder findings but she does have more difficulty with her posture and gait initiation and has had a couple of episodes of freezing today. Overall, she has done well after her recent knee replacement surgery in September 2015 and is very pleased with that. She had outpatient therapy. She is now in water aerobic classes 3 times a week. She exercises in between. She does not drink enough water. I've encouraged her to drink more water. For her fatigue, this could be a combination of physical fatigue, not drinking enough water, and more stiffness. She has had no recent falls thankfully. I've encouraged her to increase Sinemet to 4 times a day. I would like for her to keep  a more set schedule. To that end, I have suggested that she take it at 7 AM, 11 AM, 3 PM and 8 PM. I wrote this down for her and her instructions and provided her with a new prescription with 90 day supply. I would like to see her back in 3 months, sooner if the need arises. They are encouraged to call with any interim questions or concerns. I talked to her and her husband at length today about her diagnosis, its prognosis and treatment options. We talked about medical treatments and non-pharmacological approaches. We talked about maintaining a healthy lifestyle in general. I encouraged the patient to eat healthy, exercise daily and keep well hydrated, to keep a scheduled bedtime and wake time routine, to not skip any meals and eat healthy snacks in between meals and to have protein with every meal. In particular, I stressed the importance of regular exercise, within of course the patient's own mobility limitations. They again had multiple questions today which I answered.  Most of my 25 minute visit today was spent in counseling and coordination of care, reviewing test results and reviewing medication.

## 2014-04-21 ENCOUNTER — Telehealth: Payer: Self-pay

## 2014-04-21 NOTE — Telephone Encounter (Signed)
Patient called to follow up on recent ur culture results. (was treated in office and said she feels fine just never heard about result.)  Also, wants to know what your opinion is on the recent info that talcum powder can cause ovarian cancer?

## 2014-04-21 NOTE — Telephone Encounter (Signed)
Patient informed. She said she feels fine and declined rechecking u/a.

## 2014-04-21 NOTE — Telephone Encounter (Signed)
Culture from 03/26/2014 showed Escherichia coli. She was treated with antibiotics which should have covered it. She may want to drop off a clean-catch urine specimen for repeat urinalysis just to make sure that her urine has cleared. The issue with talcum powder and ovarian cancer is an old issue. There is some question as to whether using talcum powder in the groin and vaginal area could be associated with ovarian cancer. There is no firm data on this but to be prudent I recommended that patients not use talcum powder in the groin.

## 2014-05-08 ENCOUNTER — Encounter: Payer: Self-pay | Admitting: Sports Medicine

## 2014-05-08 ENCOUNTER — Ambulatory Visit (INDEPENDENT_AMBULATORY_CARE_PROVIDER_SITE_OTHER): Payer: BLUE CROSS/BLUE SHIELD | Admitting: Sports Medicine

## 2014-05-08 VITALS — BP 122/92 | Ht 63.0 in | Wt 130.0 lb

## 2014-05-08 DIAGNOSIS — G5702 Lesion of sciatic nerve, left lower limb: Secondary | ICD-10-CM

## 2014-05-08 NOTE — Progress Notes (Signed)
Patient ID: Michelle Sims, female   DOB: 08-25-1929, 81 y.o.   MRN: [id number]  Knee replacement on RT Has had buttocks pain on left since last fall Pain with walking Not with water exercise Not with sitting or lying Can stand for awhile  I think her hip pain may have started with trying to do more vigorous exercises given to her at physical therapy And these involve stepping up and that consistently hurts her hip now  X-rays that [facility] revealed a normal hip but a lot of degenerative arthritis and disc disease in the lumbar spine  Currently she has no radicular symptoms with this  Physical examination Elderly female in no acute distress BP 122/92 mmHg  Ht 5\' 3"  (1.6 m)  Wt 130 lb (58.968 kg)  BMI 23.03 kg/m2  Palpation of the bones around the pelvis and of the greater trochanter are nontender Palpation of the lumbar spine nontender Tenderness to palpation directly over the piriformis muscle Pain with hip rotation at the piriformis muscle Pain over this area with stepping up on an 8 inch step  Hip joint range of motion was normal

## 2014-05-08 NOTE — Assessment & Plan Note (Signed)
See pt information  We will work on easy exercise program  Restart low dose tramadol  Reck 4 wks

## 2014-05-08 NOTE — Patient Instructions (Signed)
Left hip motion exercise standing just do 5 and do it a few times a day On right leg keep up the  Leg lift  Stop the other exercises  Keep doing the pool exercises every other day and chair exercise on opposite days  Let's try just 1/2 tramadol twice a day   If not helping pain after 2 to 3 days go to a whole tablet twice a day  See me in 1 month

## 2014-05-19 ENCOUNTER — Ambulatory Visit: Payer: BC Managed Care – PPO | Admitting: Neurology

## 2014-05-20 ENCOUNTER — Telehealth: Payer: Self-pay | Admitting: *Deleted

## 2014-05-20 NOTE — Telephone Encounter (Signed)
Spoke with Michelle Sims about his wife's condition. She is still in a lot of pain and wanted Dr. Oneida Alar advice.  They had seen urgent care last week and was given muscle relaxers and ibuprofen. Dr. Oneida Alar recommended that she continue to take Tramadol 50mg , 2-3 times a day and to take Tylenol 1000mg  3-4 times a day. And to stop the other meds from urgent care.  They can use heat and a tennis ball for massaging.  He will check back in a week to see how she is doing, if not better then we will make an appt to see Dr. Oneida Alar.

## 2014-05-27 ENCOUNTER — Encounter: Payer: Self-pay | Admitting: Sports Medicine

## 2014-05-27 ENCOUNTER — Ambulatory Visit (INDEPENDENT_AMBULATORY_CARE_PROVIDER_SITE_OTHER): Payer: BLUE CROSS/BLUE SHIELD | Admitting: Sports Medicine

## 2014-05-27 VITALS — BP 149/76 | Ht 63.0 in | Wt 130.0 lb

## 2014-05-27 DIAGNOSIS — G5702 Lesion of sciatic nerve, left lower limb: Secondary | ICD-10-CM

## 2014-05-27 MED ORDER — KETOROLAC TROMETHAMINE 30 MG/ML IJ SOLN: 30.0000 mg | Freq: Once | INTRAMUSCULAR | Status: DC

## 2014-05-27 MED ORDER — KETOROLAC TROMETHAMINE 60 MG/2ML IM SOLN
60.0000 mg | Freq: Once | INTRAMUSCULAR | Status: AC
  Administered 2014-05-27: 60 mg via INTRAMUSCULAR

## 2014-05-27 NOTE — Assessment & Plan Note (Signed)
Based on the ultrasound scan I am still suspicious that we have the correct diagnosis  Injection with Toradol today for pain relief  Reduce her activity level and give this more rest  Stick with tramadol and Tylenol for pain  Procedure:  Injection of Left piriformis Consent obtained and verified. Time-out conducted. Noted no overlying erythema, induration, or other signs of local infection. Skin prepped in a sterile fashion. Topical analgesic spray: Ethyl chloride. Completed without difficulty. Try to cauterize the area of the piriformis that was swollen I used markers to identify the area for injection. This was lateral to the area of the sciatic nerve. Meds: Toradol 30 mg Pain immediately improved suggesting accurate placement of the medication. Advised to call if fevers/chills, erythema, induration, drainage, or persistent bleeding.  I will want her to try this program and then to come see me in 4 weeks

## 2014-05-27 NOTE — Patient Instructions (Signed)
You had an injection of toradol today This helps with inflammation and pain  Your piriformis muscle was strained and pushes on the sciatic nerve  Rest for 2 days After that start some pool exercise every other day Do easy hip motion while in the pool  Do not push any walking until this feels better and using walker is a good idea  It is OK to do some exercise for your arms while you are sitting  If any of your medicines seem to help you can try them - tramadol or tylenol for pain  Let me recheck this in 1 month

## 2014-05-27 NOTE — Progress Notes (Signed)
Patient ID: Michelle Sims, female   DOB: 1929-02-18, 81 y.o.   MRN: [id number]  Patient with left piriformis syndrome Started with PT trying to do step up exercises and touch and go (OO chair) Now too painful to walk any distance Using wheel chair to get back and forth  Has Parkinson's and takes Sinemet  No meds have helped tramadol up to tid Tylenol Ibuprofen Robaxin  Only thing that does not hurt is pool or lying down or sitting  Aching last night woke her up  Walking a short distance causes the pain to return  Examination Elderly female in some discomfort BP 149/76 mmHg  Ht 5\' 3"  (1.6 m)  Wt 130 lb (58.968 kg)  BMI 23.03 kg/m2  She will her but is hesitant to put much weight on the left hip Range of motion of the hip is unchanged Strength around the hip is unchanged Tenderness to palpation is over the deep buttocks in the piriformis area No sciatic symptoms with compression  Ultrasound The entire piriformis muscle appears hypoechoic The sciatic nerve sits on the superior border of the piriformis This is the area of palpable pain

## 2014-05-28 ENCOUNTER — Telehealth: Payer: Self-pay | Admitting: *Deleted

## 2014-05-28 NOTE — Telephone Encounter (Signed)
-----   Message from Carolyne Littles sent at 05/28/2014  8:51 AM EDT ----- Regarding: question Contact: [phone] Pt was here yesterday she got an injection, which helped until today she is in the same acute pain as when she came in.  Wants to discuss to know if this is normal for injection to wear off so fast?

## 2014-05-28 NOTE — Telephone Encounter (Signed)
Spoke with pt about her pain and explained that the injection would help some but wouldn't take away the pain completely.  She will need to stay off the hip as much as possible for the next two weeks and use the wheelchair when needing to walk long distances. Can use the pool exercises when she feels up to it.

## 2014-06-05 ENCOUNTER — Ambulatory Visit: Payer: BLUE CROSS/BLUE SHIELD | Admitting: Sports Medicine

## 2014-06-06 ENCOUNTER — Other Ambulatory Visit: Payer: Self-pay | Admitting: *Deleted

## 2014-06-06 MED ORDER — TRAMADOL HCL 50 MG PO TABS: 50.0000 mg | ORAL_TABLET | Freq: Four times a day (QID) | ORAL | Status: DC | PRN

## 2014-06-11 ENCOUNTER — Other Ambulatory Visit: Payer: Self-pay

## 2014-06-11 DIAGNOSIS — Z1231 Encounter for screening mammogram for malignant neoplasm of breast: Secondary | ICD-10-CM

## 2014-07-02 ENCOUNTER — Ambulatory Visit (INDEPENDENT_AMBULATORY_CARE_PROVIDER_SITE_OTHER): Payer: BLUE CROSS/BLUE SHIELD | Admitting: Sports Medicine

## 2014-07-02 ENCOUNTER — Encounter: Payer: Self-pay | Admitting: Sports Medicine

## 2014-07-02 VITALS — BP 142/76 | Ht 63.0 in | Wt 130.0 lb

## 2014-07-02 DIAGNOSIS — G5702 Lesion of sciatic nerve, left lower limb: Secondary | ICD-10-CM | POA: Diagnosis not present

## 2014-07-02 DIAGNOSIS — R269 Unspecified abnormalities of gait and mobility: Secondary | ICD-10-CM | POA: Diagnosis not present

## 2014-07-02 NOTE — Assessment & Plan Note (Signed)
Original injury occurred with exercises However, her current symptoms may relate to some radicular problems from the lumbar spine  Considering her large number of medications rather than adding a new med I suggested we try Cymbalta at 60 mg daily rather than 30  She may also get some benefit from the increased dose of Sinemet  She can see me again in 2 months

## 2014-07-02 NOTE — Assessment & Plan Note (Signed)
I think her gait has improved with Parkinson's treatment  Even though she has some mild hip pain I would like her to start walking again  Continue her water exercises

## 2014-07-02 NOTE — Patient Instructions (Signed)
Try to start walking 3 times a day a short distance to the dining room - have a bar handy to support you  Keep up the water aerobics 3 times a week  Try to do some recumbent biking twice a week  Let's add  cymbalta 30 mgm with breakfast Keep up the cymbalta 30 mgm with dinner  See me in 2 months

## 2014-07-02 NOTE — Progress Notes (Signed)
Patient ID: Michelle Sims, female   DOB: 05/01/1929, 81 y.o.   MRN: [id number]  Patient returns with persistent pain over her left buttocks This radiates down into her posterior thigh She has significant degenerative disc disease of her spine She also has Parkinson's disease  We tried a piriformis injection with Toradol but this made no difference in her pain  Recently her Sinemet was increased to 4 times a day This made no difference in the pain  She stopped tramadol in case it was causing fatigue, this made no difference in the fatigue She did not have significant pain relief with this so doesn't need to restart it  She comes back to see if there are other options for helping with her pain She has cut out walking and has gone back to only pool exercises 3 times a week  Examination Pleasant with limited expression but no acute distress BP 142/76 mmHg  Ht 5\' 3"  (1.6 m)  Wt 130 lb (58.968 kg)  BMI 23.03 kg/m2  Flexion and extension of both arms and both legs did not demonstrate cog wheeling Walking gait starts fairly slowly but improves to where she has some arm swing She makes about a 3 point turn  Left and right hip show normal motion for her age  Standing reveals some tightness and a little pain over the deep buttocks and the upper thigh No palpable pain over the low back

## 2014-07-08 ENCOUNTER — Ambulatory Visit
Admission: RE | Admit: 2014-07-08 | Discharge: 2014-07-08 | Disposition: A | Discharge status: Home / Self Care | Payer: BLUE CROSS/BLUE SHIELD | Source: Ambulatory Visit

## 2014-07-08 DIAGNOSIS — Z1231 Encounter for screening mammogram for malignant neoplasm of breast: Secondary | ICD-10-CM

## 2014-07-21 ENCOUNTER — Other Ambulatory Visit: Payer: Self-pay | Admitting: *Deleted

## 2014-07-21 MED ORDER — DULOXETINE HCL 30 MG PO CPEP: 30.0000 mg | ORAL_CAPSULE | Freq: Two times a day (BID) | ORAL | Status: DC

## 2014-07-22 ENCOUNTER — Ambulatory Visit (INDEPENDENT_AMBULATORY_CARE_PROVIDER_SITE_OTHER): Payer: BLUE CROSS/BLUE SHIELD | Admitting: Neurology

## 2014-07-22 ENCOUNTER — Encounter: Payer: Self-pay | Admitting: Neurology

## 2014-07-22 VITALS — BP 116/64 | HR 72 | Resp 14 | Ht 63.0 in | Wt 136.0 lb

## 2014-07-22 DIAGNOSIS — M791 Myalgia: Secondary | ICD-10-CM | POA: Diagnosis not present

## 2014-07-22 DIAGNOSIS — M7918 Myalgia, other site: Secondary | ICD-10-CM

## 2014-07-22 DIAGNOSIS — K5900 Constipation, unspecified: Secondary | ICD-10-CM | POA: Diagnosis not present

## 2014-07-22 DIAGNOSIS — R5383 Other fatigue: Secondary | ICD-10-CM | POA: Diagnosis not present

## 2014-07-22 DIAGNOSIS — C50911 Malignant neoplasm of unspecified site of right female breast: Secondary | ICD-10-CM

## 2014-07-22 DIAGNOSIS — G20 Parkinson's disease: Secondary | ICD-10-CM

## 2014-07-22 NOTE — Patient Instructions (Addendum)
I would like to encourage you to sign up for the balance class at [facility], 30 min once a week.   You can try Melatonin at night for sleep: take 5 mg one hour before your bedtime.   Continue exercising regularly.   Drink more water.   You can use miralax as needed.   Eat more fiber and yoghurt.   You may benefit from seeing [place] at [facility] again.    We will keep your sinemet the same.   Make sure, you have had your Vitamin D and B12 checked.

## 2014-07-22 NOTE — Progress Notes (Signed)
Subjective:    Patient ID: Michelle Sims is a 81 y.o. female.  HPI     Interim history:   Michelle Sims is a very pleasant 81 year old right-handed woman an underlying medical history of chronic back pain, hypertension, thyroid cancer, breast cancer, status post thyroidectomy, tonsillectomy, appendectomy, tubal ligation and mastectomy with reconstruction, who presents for followup consultation of her parkinsonism, probable right-sided predominant Parkinson's disease of the akinetic-rigid type. She is accompanied by her husband again today. I last saw her on 04/17/2014, at which time she reported more freezing and start hesitation. She had not followed recently. She had more fatigue. She was not drinking enough water. She was exercising regularly she was having bilateral hip pain. I suggested we increase her Sinemet to 4 times a day, at 7, 11, 3 PM and 8 PM.  In the interim, she was seen by Dr. Oneida Alar in sports medicine for left-sided piriformis syndrome. I reviewed his office notes from 05/27/2014 as well as 07/02/2014. Her Cymbalta was increased.  Today, 07/22/2014: She reports a fall about a week ago, outside, by her hairdresser. She bruised her knees. She had not used a cane at the time and her husband states, he would feel better if she were to use a cane. She had blood work with Dr. Inda Merlin about a month ago, which per husband was mostly fine. She still may not drink enough water. She feels fatigued very easily. She does not always sleep well. Sometimes she wakes up not feeling good at all and as the day progresses she feels a little better. Her best times seems to be late afternoon. She has not tried melatonin yet.  Previously:   I saw her on 01/14/2014, at which time she reported doing well after her right total knee replacement in September 2015 and she was in physical therapy through her orthopedics office. She was on Sinemet 3 times a day. She did notice that her posture was worse.  Sometimes she had confusion. I did not make medication changes at the time. She presents for a sooner than scheduled appointment secondary to more fatigue and balance issues.  I saw her on 07/30/2013, at which time she felt her PD symptoms were stable. She was taking Sinemet 1 pill 3 times a day. She was on half a pill of clonazepam, 0.25 mg each night. She was on Cymbalta 30 mg daily per her psychiatrist, Dr. Caprice Beaver. She had finished her study at [facility]. I asked her to start using a cane for safety. I did not make any changes to her medications at the time.   I first met her on 01/30/2013, at which time it felt her exam was stable. She had participated in a study at [facility] which included taking a DaT scan. She had been taking Sinemet 3 times a day but was taking it with her meals. The only thing I suggested at the time of her first visit was to take the Sinemet the way from her mealtimes to ensure better absorption. In the interim, her husband called back stating that she had been experiencing some nausea. Her primary care physician reduced her Sinemet to half a pill 3 times a day. I talked to the patient in April and suggested that she continue with half a pill 3 times a day and try taking a whole pill 3 times a day if she noted an increase in her parkinsonian symptoms.   She previously followed with Dr. Morene Antu and was last seen by  him on 12/06/2011, at which time he recommended a DaT scan and a trial of Sinemet to help differentiate her symptoms and help diagnose Parkinson's disease. She was advised that before she could have a DaT scan she would have to come off of Celexa. She did not end up having that test, as she enrolled in a study at [place]. She was supposed to followup after a couple of months, but did not. However, in the interim she started going to [employer] and got into a clinical trial for Parkinson's there. She was placed on Requip, then Sinemet. Requip caused severe sleepiness. She  finished the study at [facility] under Dr. Maxine Glenn and as part of the study, she had 2 DaT scans. She did not take any study drugs. She reported severe R knee pain and had injections into her knee. She may need a knee replacement surgery, but is afraid. She goes to [employer] at [employer]. She lives at [place] with her husband in independent living and still drives. Her husband states she is a good driver. She has fatigue. She has seen Dr. Oneida Alar in sports medicine for her arthritis.   She was initially seen by Dr. Erling Cruz on 05/15/2002 for pain in her hips with evidence of lumbar spinal radiculopathy. She had degenerative disc disease. 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 She did well with conservative therapy. She has a long-standing history of back pain and was treated by Dr. Niel Hummer, who recommended she use amitriptyline. She has a over a 5 year history of deteriorating handwriting. Her balance has been declining as well. She has to hold onto the rail, going up and down steps. She has fallen infrequently. Her husband and her son started noticing that she shuffles her feet while walking. She started complaining of fatigue and lack of energy. She has remained fairly independent in her ADLs. She has problems with her memory. She denies a family history of tremor. She has long-standing loss of sense of smell. She does not have constipation or acting out dreams. She denies bowel or bladder incontinence. She has been seen by Dr. Tamala Julian for palpitations. CMP,CBC, TSH, T4, and B12 were normal in 2011 and 2012. She had been on doses of vitamin B6 200 to 100 mg per day. Her vitamin B6 level 08/13/2010 was greater than 100. Her B12 was 695. She discontinued B6. EMG/NCV 8./16/12 was normal except for lumbosacral deinnervation, compatible with lumbosacral radiculopathy. MRI brain from 08/26/2010 showed chronic  microvascular changes and mild atrophy. She has seen Dr. Casimiro Needle for depression and was changed from citalopram to sertraline 50 mg daily without any benefit and then started seeing Dr. Harley Alto at [facility]. She went off of tramadol because of the possibility of serotonergic syndrome, but restarted it. She has been in counseling. She denies numbness in her feet and legs. She describes involuntary movements. She has right knee pain. Basic metabolic panel normal 04/02/74. A course of physical therapy for her gait did not help.    Her Past Medical History Is Significant For: Past Medical History  Diagnosis Date  . Hypertension   . Blood type, Rh negative   . Endocervical polyp   . Parkinson disease   . Osteopenia 05/2013    T score -1.5  . Insomnia   . Anxiety   . IBS (irritable bowel syndrome)   . GERD (gastroesophageal reflux disease)   .  Diverticulosis   . Lymphedema   . Chronic fatigue   . Hypothyroidism   . Depression   . Neuromuscular disorder     Parkinson's disease   . Arthritis     knee & back   . Thyroid cancer   . Breast cancer   . RBBB 10/07/2013    Her Past Surgical History Is Significant For: Past Surgical History  Procedure Laterality Date  . Breast surgery  1979    MASTECTOMY AND RECONSTRUCTION  . Appendectomy  1979  . Tubal ligation    . Dilation and curettage of uterus      MISCARRIAGE  . Tonsillectomy    . Hysteroscopy  [id number]    AND D&C  . Thyroidectomy    . Mastectomy Bilateral   . Eye surgery Bilateral     w/ IOL  . Total knee arthroplasty Right 10/07/2013    Procedure: RIGHT TOTAL KNEE ARTHROPLASTY;  Surgeon: Kerin Salen, MD;  Location: [facility];  Service: Orthopedics;  Laterality: Right;    Her Family History Is Significant For: Family History  Problem Relation Age of Onset  . Hypertension Mother   . Hyperlipidemia Mother   . Hypertension Father   . Hyperlipidemia Father   . Hyperlipidemia Brother   . Hypertension Brother   . Heart  failure Mother   . Heart failure Father     Her Social History Is Significant For: History   Social History  . Marital Status: Married    Spouse Name: N/A  . Number of Children: N/A  . Years of Education: N/A   Social History Main Topics  . Smoking status: Never Smoker   . Smokeless tobacco: Never Used  . Alcohol Use: Yes     Comment: rare  . Drug Use: No  . Sexual Activity: No   Other Topics Concern  . None   Social History Narrative    Her Allergies Are:  Allergies  Allergen Reactions  . Biaxin [Clarithromycin] Other (See Comments)    Pt does not remember reaction   . Ciprofloxacin Nausea And Vomiting  . Hctz [Hydrochlorothiazide] Other (See Comments)    weakness and hyponatremia  . Other Nausea Only  . Prednisone Other (See Comments)    Swelling and facial redness   . Sulfamethoxazole-Trimethoprim Other (See Comments) and Rash    Sore throat  :   Her Current Medications Are:  Outpatient Encounter Prescriptions as of 07/22/2014  Medication Sig  . amLODipine (NORVASC) 2.5 MG tablet Take 2.5 mg by mouth daily.  . Calcium Carb-Cholecalciferol (CALCIUM 600 + D PO) Take 1 tablet by mouth daily with lunch.  . carbidopa-levodopa (SINEMET IR) 25-100 MG per tablet Take 1 tablet by mouth 4 (four) times daily.  . Cholecalciferol (VITAMIN D3) 2000 UNITS TABS Take 2,000 Units by mouth daily after lunch.   . clonazePAM (KLONOPIN) 0.5 MG tablet Take 0.25 mg by mouth at bedtime.   . Cranberry (SM CRANBERRY) 300 MG tablet Take 300 mg by mouth daily.  . DULoxetine (CYMBALTA) 30 MG capsule Take 1 capsule (30 mg total) by mouth 2 (two) times daily.  . irbesartan (AVAPRO) 75 MG tablet Take 75 mg by mouth at bedtime.   Marland Kitchen levothyroxine (SYNTHROID, LEVOTHROID) 112 MCG tablet Take 112 mcg by mouth daily before breakfast.  . methenamine (HIPREX) 1 G tablet Take 1 g by mouth daily after breakfast.   . methocarbamol (ROBAXIN) 750 MG tablet   . Multiple Vitamin (MULTIVITAMIN WITH  MINERALS) TABS tablet Take  1 tablet by mouth daily. Women's Over 19  . NONFORMULARY OR COMPOUNDED ITEM Insert 1 ml (1 syringe) into the vagina at bedtime 3 times a week as directed.  . raloxifene (EVISTA) 60 MG tablet TAKE 1 TABLET (60 MG TOTAL) BY MOUTH DAILY.  . traMADol (ULTRAM) 50 MG tablet Take 1 tablet (50 mg total) by mouth every 6 (six) hours as needed.  . vitamin C (ASCORBIC ACID) 500 MG tablet Take 500 mg by mouth daily.  . [DISCONTINUED] acetaminophen (TYLENOL) 500 MG tablet Take 1,000 mg by mouth every 6 (six) hours as needed.  . [DISCONTINUED] ibuprofen (ADVIL,MOTRIN) 200 MG tablet Take 400 mg by mouth every 6 (six) hours as needed.   No facility-administered encounter medications on file as of 07/22/2014.  :  Review of Systems:  Out of a complete 14 point review of systems, all are reviewed and negative with the exception of these symptoms as listed below:   Review of Systems  Neurological:       Increased confusion. Pain in buttocks, groin, and thighs when standing from a sitting position. Increased unsteady gait, fear of falling. Has a question about exercising for Parkinson's.      Objective:  Neurologic Exam  Physical Exam Physical Examination:   Filed Vitals:   07/22/14 1301  BP: 116/64  Pulse: 72  Resp: 14   General Examination: The patient is a very pleasant 81 y.o. female in no acute distress.  HEENT: Normocephalic, atraumatic, pupils are equal, round and reactive to light and accommodation. Extraocular tracking shows minimal saccadic breakdown without nystagmus noted. There is no limitation to her gaze. There is mild decrease in eye blink rate. Hearing is intact. Face is symmetric with minimal facial masking and normal facial sensation. There is no lip, neck or jaw tremor. Neck is mildly rigid with intact passive ROM. There are no carotid bruits on auscultation. Oropharynx exam reveals mild to moderate mouth dryness. No significant airway crowding is noted.  Mallampati is class II. Tongue protrudes centrally and palate elevates symmetrically. There is no drooling.  R shoulder is lower than L.   Chest: is clear to auscultation without wheezing, rhonchi or crackles noted.  Heart: sounds are regular and normal without murmurs, rubs or gallops noted.   Abdomen: is soft, non-tender and non-distended with normal bowel sounds appreciated on auscultation.  Extremities: There is no pitting edema in the distal lower extremities bilaterally. Pedal pulses are intact. There are no varicose veins.  Skin: is warm and dry with no trophic changes noted. Age-related changes are noted on the skin. She has a spot on the dorsum of the right hand which is healing.   Musculoskeletal: exam reveals no obvious joint deformities, tenderness, joint swelling or erythema, except for mild R knee swelling, she has mild swelling of the R arm.   Neurologically:  Mental status: The patient is awake and alert, paying good  attention. She is able to provide the history. Her husband provides more details. She is oriented to: person, place, time/date, situation, day of week, month of year and year. Her memory, attention, language and knowledge are fairly well preserved. There is no aphasia, agnosia, apraxia or anomia. There is a mild degree of bradyphrenia. Speech is mildly hypophonic with no dysarthria noted. Mood is congruent and affect is normal.    Cranial nerves are as described above under HEENT exam. In addition, shoulder shrug is normal with equal shoulder height noted.  Motor exam: Normal bulk, and strength for age  is noted. There are no dyskinesias noted. Tone is very mildly rigid really only in the right upper extremity with some cogwheeling. There is overall mild bradykinesia. There is no drift or rebound.  There is no tremor.   Romberg is negative.  Reflexes are 1+ in the upper extremities and 1+ in the lower extremities.   Fine motor skills exam: Finger taps are mildly  impaired on the right and minimally impaired on the left. Hand movements are mildly impaired on the right and minimally impaired on the left. RAP (rapid alternating patting) is minimally impaired on the right and not impaired on the left. Foot taps are mildly impaired on the right and minimally impaired on the left. Foot agility (in the form of heel stomping) is minimally impaired on the right and not impaired on the left.    Cerebellar testing shows no dysmetria or intention tremor on finger to nose testing. There is no truncal or gait ataxia.   Sensory exam is intact.   Gait, station and balance: She stands up from the seated position with mild difficulty and does need to push up with Her hands. She needs no assistance. No veering to one side is noted. She c/o pain in the left hip and both buttocks. She complains of some low back pain. Posture is mild to moderately stooped. She has a little start hesitation but no freezing. She turns in 3 steps.Tandem walk is not possible. Balance is mildly impaired.      Assessment and Plan:    In summary, Michelle Sims is a very pleasant 81 year old female with a history of parkinsonism, most likely R sided predominant Parkinson's disease, akinetic-rigid type. Her physical exam is for the most part stable. All in all she has had milder findings and we increased her Sinemet last time to qid, due to more difficulty with her posture and gait initiation and more freezing, which she feels may have helped some. She is still bothered by fatigue and not always sleeping well at night. She is advised to try melatonin, 5 mg about an hour before bedtime. She is encouraged to drink more water.Overall, she has done well with time. She has done well after her knee replacement surgery in September 2015. She had outpatient therapy. She continues with water aerobic classes 3 times a week. She exercises in between. She still may not drink enough water. I've encouraged her to drink  more water. For her fatigue, this could be a combination of physical fatigue, not drinking enough water, and more stiffness. She had a recent fall but thankfully did not injure herself. I suggested we continue with Sinemet 4 times a day. She is taking it fairly well on schedule at this time. She reports some constipation for which she is encouraged to use MiraLAX as needed and increase her fiber intake and also try eating yogurt every day for possible. I talked to her and her husband at length today about her diagnosis, its prognosis and treatment options. We talked about medical treatments and non-pharmacological approaches. We talked about maintaining a healthy lifestyle in general. I encouraged the patient to eat healthy, exercise daily and keep well hydrated, to keep a scheduled bedtime and wake time routine, to not skip any meals and eat healthy snacks in between meals and to have protein with every meal. In particular, I stressed the importance of regular exercise, within of course the patient's own mobility limitations. They again had multiple questions today which I answered. She  is encouraged to use a cane when they go outside. I suggested a 4 month follow-up, sooner if needed. Most of my 40 minute visit today was spent in counseling and coordination of care, reviewing test results and reviewing medication and the diagnosis of Parkinson's disease, its prognosis and treatment options.

## 2014-09-02 ENCOUNTER — Encounter: Payer: Self-pay | Admitting: Sports Medicine

## 2014-09-02 ENCOUNTER — Ambulatory Visit (INDEPENDENT_AMBULATORY_CARE_PROVIDER_SITE_OTHER): Payer: BLUE CROSS/BLUE SHIELD | Admitting: Sports Medicine

## 2014-09-02 VITALS — BP 150/70 | HR 81 | Ht 63.0 in | Wt 135.0 lb

## 2014-09-02 DIAGNOSIS — G5702 Lesion of sciatic nerve, left lower limb: Secondary | ICD-10-CM | POA: Diagnosis not present

## 2014-09-02 DIAGNOSIS — M25512 Pain in left shoulder: Secondary | ICD-10-CM

## 2014-09-02 NOTE — Patient Instructions (Signed)
Shoulder exercises: do these exercises most days.  1. Holding on to door with left hand and stretch with the arm going behind you.  2. Left arm swing front and back and round in circles.  3. Sitting in chair, lean forward and place arms on table in front of you  You shouldn't feel intense pain but more of a stretch.   You can use periodic tylenol to help with the pain.    Continue doing hip exercises every other day.

## 2014-09-02 NOTE — Progress Notes (Signed)
  Michelle Sims - 81 y.o. female MRN [id number]  Date of birth: Sep 26, 1929   Michelle Sims is a 81 y.o. female who presents today for left shoulder and hip pain.   Her left shoulder pain has been going on for the past few months. The pain is occurring on the posterior aspect of her left shoulder. The pain is worse with overhead activities. The pain is intermittent and described as a dull ache. It is relieved when she is able to rest it. She denies any prior surgery or injury to her left shoulder. She denies any fall. She denies any radiation any weakness in her left arm.  Complaint of left hip pain located in her left buttock. She has a history of piriformis syndrome occurring in her left buttock. The pain is worse in the morning and when she is trying to stand. She denies any prior injury or surgery on her left hip. She has previously been in physical therapy and they may have pushed her too hard exacerbating her hip pain some time ago.  She has tried Toradol in the past with no difference in her pain. The imaging of her spine showing significant degenerative disc disease.  PMHx -  reviewed.  Contributory factors include: parkinsoism, with probable right-sided predominant Parkinson's disease of the akinetic-rigid type, chronic back pain, hypertension, thyroid cancer, breast cancer PSHx -  reviewed.  Contributory factors include:  status post thyroidectomy, Total Right knee replacement in Sept 2015 Medications - Cymbalta, Sinemet, klonopin,    ROS Per HPI   Exam:  Filed Vitals:   09/02/14 1425  BP: 150/70  Pulse: 81   Gen: NAD Cardiorespiratory - Normal respiratory effort/rate.   Shoulder: Laterality: left Appearance: symmetric, no deformities Tenderness: none  Range of Motion: Active ROM limited 110 degrees of abduction, Active ROM flexion limited to 110 degrees. Passively able to achieve FROM External rotation limited on left to 50 degrees compared to 70 degrees on right   Maneuvers: Empty can: reproduced pain on left  Internal rotation: normal  External rotation: normal  Normal scapular function observed. No painful arc and no drop arm sign. Strength:  Bicep: 5/5 Grip: 5/5 Back:  Tenderness: no TTP of the spinal process or SI joint.  Hip:  Appearance: symmetric, no deformities.  Palpation: no tenderness of greater trochanter,  Rotation Reduced: internal rotation limited on left.  Neuro: Strength hip flexion 5/5,  knee extension 5/5, knee flexion 5/5, dorsiflexion 5/5, plantar flexion 5/5 Reflexes: patella 2/2 Bilateral  Achilles 2/2 Bilateral Straight Leg Raise: negative Sensation to light touch intact: yes No cogwheel appreciated  Walking gait is slow  Transition from exam table to chair is slow and controlled   Imaging:  03/20/2012: Left shoulder x-ray: showing mild degenerative changes.

## 2014-09-03 NOTE — Assessment & Plan Note (Signed)
Pain most likely related to piriformis syndrome that she has a history of.  Reported that during PT she was pushed harder than normal and started having pain in her buttock some time ago.  Imaging of her spine has shown significant disc disease and she denies any radicular symptoms.  Toradol hasn't improved her pain  - encourage continued exercise as she tolerates  - continue current dose of cymbalta  - will follow up as symptoms persists or worsen

## 2014-09-03 NOTE — Assessment & Plan Note (Signed)
She has a history of rotator cuff tear in 2015 and today he is having symptoms suggesting a rotator cuff pathology or the beginnings of adhesive capsulitis She is intolerant and he can do prednisone so injection was not considered She has been given Toradol for other types of pain but with no relief - She was Codman exercises to improve her functional range of motion. - Continuing with the same dose of Cymbalta - Her neurologist has recently increased her Sinemet - She continues Tylenol periodically for the pain - She will follow-up as needed for this

## 2014-09-12 ENCOUNTER — Encounter: Payer: Self-pay | Admitting: Gynecology

## 2014-09-12 ENCOUNTER — Ambulatory Visit (INDEPENDENT_AMBULATORY_CARE_PROVIDER_SITE_OTHER): Payer: BLUE CROSS/BLUE SHIELD | Admitting: Gynecology

## 2014-09-12 VITALS — BP 122/76 | Ht 62.0 in | Wt 136.0 lb

## 2014-09-12 DIAGNOSIS — N816 Rectocele: Secondary | ICD-10-CM | POA: Diagnosis not present

## 2014-09-12 DIAGNOSIS — Z01419 Encounter for gynecological examination (general) (routine) without abnormal findings: Secondary | ICD-10-CM

## 2014-09-12 DIAGNOSIS — N952 Postmenopausal atrophic vaginitis: Secondary | ICD-10-CM | POA: Diagnosis not present

## 2014-09-12 DIAGNOSIS — M8580 Other specified disorders of bone density and structure, unspecified site: Secondary | ICD-10-CM

## 2014-09-12 NOTE — Patient Instructions (Signed)

## 2014-09-12 NOTE — Progress Notes (Signed)
Michelle Sims [birth year] [id number]        81 y.o.  [id number] for breast and pelvic exam. Several issues noted below.  Past medical history,surgical history, problem list, medications, allergies, family history and social history were all reviewed and documented as reviewed in the EPIC chart.  ROS:  Performed with pertinent positives and negatives included in the history, assessment and plan.   Additional significant findings :  none   Exam: Michelle Sims Vitals:   09/12/14 1419  BP: 122/76  Height: 5\' 2"  (1.575 m)  Weight: 136 lb (61.689 kg)   General appearance:  Normal affect, orientation and appearance. Skin: Grossly normal HEENT: Without gross lesions.  No cervical or supraclavicular adenopathy. Thyroid normal.  Lungs:  Clear without wheezing, rales or rhonchi Cardiac: RR, without RMG Abdominal:  Soft, nontender, without masses, guarding, rebound, organomegaly or hernia Breasts:  Examined lying and sitting. Left with implant and without masses, retractions, discharge or axillary adenopathy.  Right status post mastectomy with reconstruction and implant. No masses or axillary adenopathy. Pelvic:  Ext/BUS/vagina with significant atrophy and narrowing of the upper vagina. First degree to second degree rectocele noted.  Cervix difficult to visualize but no abnormalities.  Uterus grossly normal size, midline and mobile nontender   Adnexa  Without masses or tenderness    Anus and perineum  Normal   Rectovaginal  Normal sphincter tone without palpated masses or tenderness.    Assessment/Plan:  81 y.o. [id number] female for breast and pelvic exam.   1. Postmenopausal/significant vaginal atrophy. Patient using point formulated vaginal cream [facility]. Doing well with this and wants to continue. Again reviewed the unusual nature of using estrogen cream and a breast cancer survivor. The potential for absorption stimulation of disease discussed. We've  had this discussion on multiple occasions and she understands the issues. I also discussed possible risks as far as endometrial stimulation and thrombosis risks. Patient comfortable continuing and will call when she needs refill. 2. Rectocele. Stable on serial exams. Asymptomatic to the patient. Continue to monitor. 3. Patient notes her abdomen seems to be more prominent. Her exam is totally normal to a vigorous abdominal exam to include no masses or organomegaly. No abnormalities on bimanual pelvic exam. She's having no symptoms such as pain nausea vomiting diarrhea constipation. Discussed various possibilities and workup to include ultrasound of the pelvis. She had an abdominal ultrasound approximately one year ago which was normal. She had a pelvic ultrasound for similar complaints Dr. Cherylann Banas 3 years ago which did not visualize her ovaries due to atrophic nature. Patient does not want to proceed with ultrasound now but prefers just monitor. I suspect this is due to abdominal wall weakness and we discussed this. 4. Osteopenia. DEXA 2015 T score -1.5. Plan repeat next year at two-year interval. She continues on Evista. 5. Pap smear 2013. No Pap smear done today. No history of significantly abnormal Pap smears.  Reviewed current screening guidelines and we both agree to stop screening based on age. 6. Mammography 06/2014. Continue with annual mammography. SBE monthly reviewed. Exam today NED. 7. Colonoscopy 2010. Repeat at their recommended interval. 8. Health maintenance. No routine blood work done as this is done at her primary physician's office. Follow up 1 year, sooner as needed.   Michelle Auerbach MD, 2:42 PM 09/12/2014

## 2014-09-15 ENCOUNTER — Other Ambulatory Visit: Payer: Self-pay | Admitting: Gynecology

## 2014-10-27 ENCOUNTER — Telehealth: Payer: Self-pay | Admitting: *Deleted

## 2014-10-27 DIAGNOSIS — G20 Parkinson's disease: Secondary | ICD-10-CM

## 2014-10-27 DIAGNOSIS — G20A1 Parkinson's disease without dyskinesia, without mention of fluctuations: Secondary | ICD-10-CM

## 2014-10-27 MED ORDER — CARBIDOPA-LEVODOPA 25-100 MG PO TABS: 1.0000 | ORAL_TABLET | Freq: Four times a day (QID) | ORAL | Status: DC

## 2014-10-27 NOTE — Telephone Encounter (Signed)
Rx has been sent.  Patient has appt scheduled.

## 2014-10-28 ENCOUNTER — Ambulatory Visit (INDEPENDENT_AMBULATORY_CARE_PROVIDER_SITE_OTHER): Payer: BLUE CROSS/BLUE SHIELD | Admitting: Women's Health

## 2014-10-28 ENCOUNTER — Telehealth: Payer: Self-pay | Admitting: Interventional Cardiology

## 2014-10-28 ENCOUNTER — Telehealth: Payer: Self-pay

## 2014-10-28 ENCOUNTER — Encounter: Payer: Self-pay | Admitting: Women's Health

## 2014-10-28 VITALS — BP 120/76

## 2014-10-28 DIAGNOSIS — N3000 Acute cystitis without hematuria: Secondary | ICD-10-CM | POA: Diagnosis not present

## 2014-10-28 DIAGNOSIS — R300 Dysuria: Secondary | ICD-10-CM | POA: Diagnosis not present

## 2014-10-28 DIAGNOSIS — N898 Other specified noninflammatory disorders of vagina: Secondary | ICD-10-CM

## 2014-10-28 LAB — URINALYSIS W MICROSCOPIC + REFLEX CULTURE
BILIRUBIN URINE: NEGATIVE
CASTS: NONE SEEN [LPF]
Crystals: NONE SEEN [HPF]
GLUCOSE, UA: NEGATIVE
Ketones, ur: NEGATIVE
NITRITE: NEGATIVE
Protein, ur: NEGATIVE
SPECIFIC GRAVITY, URINE: 1.02 (ref 1.001–1.035)
Yeast: NONE SEEN [HPF]
pH: 7 (ref 5.0–8.0)

## 2014-10-28 MED ORDER — URIBEL 118 MG PO CAPS: ORAL_CAPSULE | ORAL | Status: DC

## 2014-10-28 MED ORDER — NITROFURANTOIN MONOHYD MACRO 100 MG PO CAPS: 100.0000 mg | ORAL_CAPSULE | Freq: Two times a day (BID) | ORAL | Status: AC

## 2014-10-28 NOTE — Progress Notes (Signed)
Patient ID: Michelle Sims, female   DOB: [birth year], 81 y.o.   MRN: [id number] Presents with a one day history of dysuria, denies frequency, urgency, back or abdominal pain, nausea, vomiting, or fever.  History of numerous allergies.  Exam: Appears well.  No costovertebral tenderness, abdomen is soft and non-tender.   UA: Blood: trace, leukocyte esterase: 3+, WBC: Packed, RBC: 3-10, bacteria: Many  Acute Cystitis  Plan: Urine culture pending. Prescribed Macrobid 100 mg, twice a day, for 7 days and uribel 118 mg or Azo prn.  Discussed UTI prevention such as changing out of bathing suit right after water aerobics.  Call if symptoms do not resolve or if they continue to worsen.

## 2014-10-28 NOTE — Telephone Encounter (Signed)
On 10/3, patient dropped off form for Dr. Rexene Alberts to sign off on for patient to participate in a cycling program at [facility] for patients with Parkinson's. Dr. Rexene Alberts would like to make sure patient does not have any cardiac restrictions. I spoke to patient to up date her. She states that her cardiologist is Dr. Daneen Schick, I called his office and left message for him to advise if patient has any cardiac restrictions.

## 2014-10-28 NOTE — Telephone Encounter (Signed)
New message      They have a form from the [facility] to enroll pt in a cycling program for parkinsons.  They are asking Dr Tamala Julian if pt has any restrictions that would prohibit her from participating in this program?  Please call

## 2014-10-29 NOTE — Telephone Encounter (Signed)
Lisa from Dr. Mallie Mussel Smith's office called back. Lattie Haw states that Dr. Tamala Julian is okay with patient doing cardio or aerobic exercise. He just advises against and isometric exercises.

## 2014-10-29 NOTE — Telephone Encounter (Signed)
Left message that Dr. Rexene Alberts has signed off on spin call at [facility] for PD patients. And that I have faxed in the form that she left for [place] to the [facility].

## 2014-10-29 NOTE — Telephone Encounter (Signed)
Fwd to Dr.Smith to advise 

## 2014-10-29 NOTE — Telephone Encounter (Signed)
I would have no specific concerns. Exercise needs to be predominantly cardio/aerobic. Avoid heavy isometric activity.

## 2014-10-29 NOTE — Telephone Encounter (Signed)
[facility]

## 2014-10-29 NOTE — Telephone Encounter (Signed)
Returned call to [employer]	[place] @ Dr.Athar's office. She is aware of Dr.Smith's response. I would have no specific concerns. Exercise needs to be predominantly cardio/aerobic. Avoid heavy isometric activity She verbalized understanding

## 2014-10-30 ENCOUNTER — Telehealth: Payer: Self-pay | Admitting: *Deleted

## 2014-10-30 LAB — URINE CULTURE

## 2014-10-30 NOTE — Telephone Encounter (Signed)
Pt was treated for UTI on 10/28/14 with Macrobid 100 mg x 7 days, pt said on Tuesday she noticed feeling very tired, weak, notices her heart beat, no pain anywhere. Pt said she hasn't been feeling while some time now, but this feel different. Was thinking maybe antibiotic is not mixing well with other medication she takes. Pt is unsure would like you thoughts? Says you can call her at home # if needed.  Please advise

## 2014-10-30 NOTE — Telephone Encounter (Signed)
Telephone call, reviewed urine culture positive for greater than 100,000 Escherichia coli will complete 3 days of  Macrobid today and stop and will await culture sensitivity results. States is feeling better from a urinary standpoint.

## 2014-10-31 ENCOUNTER — Telehealth: Payer: Self-pay

## 2014-10-31 NOTE — Telephone Encounter (Signed)
Patient called saying she was trying to get in touch with you. Wants urine culture results. Going to see another MD at 3 and will not be able to be called and hoped to find out before. [phone]

## 2014-10-31 NOTE — Telephone Encounter (Signed)
Telephone call, states is feeling better, completed 3 days of Macrobid, will watch for now. Macrobid was effective for sensitivity, unable to tolerate.

## 2014-11-13 ENCOUNTER — Encounter: Payer: Self-pay | Admitting: Sports Medicine

## 2014-11-13 ENCOUNTER — Ambulatory Visit (INDEPENDENT_AMBULATORY_CARE_PROVIDER_SITE_OTHER): Payer: BLUE CROSS/BLUE SHIELD | Admitting: Sports Medicine

## 2014-11-13 VITALS — BP 139/53 | HR 81 | Ht 62.0 in | Wt 130.0 lb

## 2014-11-13 DIAGNOSIS — G20 Parkinson's disease: Secondary | ICD-10-CM | POA: Diagnosis not present

## 2014-11-13 DIAGNOSIS — G20A1 Parkinson's disease without dyskinesia, without mention of fluctuations: Secondary | ICD-10-CM | POA: Insufficient documentation

## 2014-11-13 DIAGNOSIS — M25512 Pain in left shoulder: Secondary | ICD-10-CM

## 2014-11-13 MED ORDER — DICLOFENAC SODIUM 1 % TD GEL: 2.0000 g | Freq: Four times a day (QID) | TRANSDERMAL | Status: DC

## 2014-11-13 NOTE — Patient Instructions (Signed)
Your left shoulder does seem like it has arthritis  Keep up motion exercises for your shoulder  Try voltaren gel and rub into shoulder 3 to 4 times per day  If this helps don't take Mobic  Keep up your exercise regimen  See me as needed

## 2014-11-13 NOTE — Assessment & Plan Note (Signed)
She does have some past rotator cuff issues in the left shoulder Her pain currently seeing secondary to osteoarthritis that is documented on x-ray Clinically and by report she has a spur under her subacromial area  She is having difficulty taking oral NSAIDs Trial of topical Voltaren to the left shoulder  Keep up range of motion exercises for the left shoulder

## 2014-11-13 NOTE — Progress Notes (Signed)
Patient ID: MANUELA HALBUR, female   DOB: February 07, 1929, 81 y.o.   MRN: [id number]  Follow up of left shoulder More pain past 3 mos We had an x-ray in 2014 that showed mild arthritis  On last visit we gave her motion exercises We were considering whether she was developing early frozen shoulder because she had limited motion  Since her visit with [place] she has also seen Dr. Darylene Price A new x-ray of her left shoulder shows more arthritis and a subacromial spur He gave her Mobic which does not seem to help the pain and she has had some GI problems withNSAIDs in the past  Past history  Early Parkinson's disease/ see Remainder of chronic problems on the problem list  Review of systems Symptoms and tightness of muscles is less/ tremor is much improved/walking gait is stable and her steps are short but improved/ hand writing is still difficult  Examination Elderly female in no acute distress BP 139/53 mmHg  Pulse 81  Ht 5\' 2"  (1.575 m)  Wt 130 lb (58.968 kg)  BMI 23.77 kg/m2  No cog wheeling of the right or left arm Left shoulder Rotator cuff strength testing was good Negative signs of impingement although supraspinatous testing causes mild pain in the upper lateral left arm  Range of motion on forward flexion lacks about 20 Range of motion on horizontal abduction and elevation is only to 100 versus 150 on the right External rotation is 10 less on the left  Walking gait shows improvement with small steps but she can execute a turn with a minimal number of steps Stiffness getting out of a chair

## 2014-11-13 NOTE — Assessment & Plan Note (Signed)
I see significant improvement in her tremor and in her stiffness Her gait is also improved She continues taking Sinemet  I did not detect muscle soreness or tightness to suggest her shoulder pain relates to her Parkinson's

## 2014-11-19 ENCOUNTER — Telehealth: Payer: Self-pay | Admitting: *Deleted

## 2014-11-19 NOTE — Telephone Encounter (Signed)
Spoke to [place] and told her to wean it by taking 2 days then off one, try that for a couple of weeks.  Then cut back to taking one pill a night and off the next, for a few weeks. If she feels ok then she can take one and then 3 days off. Patient understood and will keep track of this on her calendar.

## 2014-11-19 NOTE — Telephone Encounter (Signed)
-----   Message from Carolyne Littles sent at 11/19/2014 11:17 AM EDT ----- Regarding: question Contact: [phone] Wants to know how to wean off of klonopin? Dr. Oneida Alar gave her the instructions last week but they can't remember them. Would like a call before lunch.

## 2014-11-24 ENCOUNTER — Encounter: Payer: Self-pay | Admitting: Neurology

## 2014-11-24 ENCOUNTER — Ambulatory Visit (INDEPENDENT_AMBULATORY_CARE_PROVIDER_SITE_OTHER): Payer: BLUE CROSS/BLUE SHIELD | Admitting: Neurology

## 2014-11-24 VITALS — BP 118/78 | HR 72 | Resp 14 | Ht 62.0 in | Wt 136.0 lb

## 2014-11-24 DIAGNOSIS — G20 Parkinson's disease: Secondary | ICD-10-CM | POA: Diagnosis not present

## 2014-11-24 DIAGNOSIS — G20A1 Parkinson's disease without dyskinesia, without mention of fluctuations: Secondary | ICD-10-CM

## 2014-11-24 DIAGNOSIS — M25552 Pain in left hip: Secondary | ICD-10-CM

## 2014-11-24 MED ORDER — CARBIDOPA-LEVODOPA 25-100 MG PO TABS: 1.0000 | ORAL_TABLET | Freq: Four times a day (QID) | ORAL | Status: DC

## 2014-11-24 NOTE — Patient Instructions (Signed)
I think your Parkinson's disease has remained fairly stable, which is reassuring. Nevertheless, as you know, this disease does progress with time. It can affect your balance, your memory, your mood, your bowel and bladder function, your posture, balance and walking and your activities of daily living. However, there are good supportive treatments and symptomatic treatments available, so most patients have a change to a good quality life and life expectancy is not typically altered. Overall you are doing fairly well but I do want to suggest a few things today:  Remember to drink plenty of fluid at least 6 glasses (8 oz each), eat healthy meals and do not skip any meals. Try to eat protein with a every meal and eat a healthy snack such as fruit or nuts in between meals. Try to keep a regular sleep-wake schedule and try to exercise daily, particularly in the form of walking, 20 minutes a day, if you can. Use a cane for   Taking your medication on schedule is key.   Try to stay active physically and mentally. Engage in social activities in your community and with your family and try to keep up with current events by reading the newspaper or watching the news. Try to do word puzzles and you may like to do puzzles and brain games on the computer such as on [email or web address]   As far as your medications are concerned, I would like to suggest that you take your current medication with the following additional changes: no changes.     I would like to see you back in 6 months, sooner if we need to. Please call us with any interim questions, concerns, problems, updates or refill requests.  Our phone number is [phone]. We also have an after hours call service for urgent matters and there is a physician on-call for urgent questions, that cannot wait till the next work day. For any emergencies you know to call 911 or go to the nearest emergency room.   You can email me through my chart and also leave a phone  message for Beverlee Nims, my nurse.

## 2014-11-24 NOTE — Progress Notes (Signed)
Subjective:    Patient ID: Michelle Sims is a 81 y.o. female.  HPI     Interim history:   Michelle Sims is a very pleasant 81 year old right-handed woman an underlying medical history of chronic back pain, hypertension, thyroid cancer, breast cancer, status post thyroidectomy, tonsillectomy, appendectomy, tubal ligation and mastectomy with reconstruction, who presents for followup consultation of her parkinsonism, probable right-sided predominant Parkinson's disease of the akinetic-rigid type. She is accompanied by her husband again today. I last saw her on 07/22/2014, at which time she reported a recent fall about a week prior at the hairdresser's. She bruised her knees. She was not using a cane at the time. Her husband noted that she would probably do better using a cane. She had blood work with her primary care physician which per husband was mostly fine. She was not drinking enough water. She was complaining of fatigue. She was not always sleeping well. She was not always rested. She had not tried melatonin yet. I suggested she try to exercise regularly. For sleep, I suggested she try melatonin. I asked her to drink more water and add more fiber to her diet. [facility].   Today, 11/24/2014: She reports that she has not gone more than once for the spinning class, she would rather continue with water aerobics and walking. She has some visual illusions, but no frank hallucinations. She has not fallen, but is fearful of falling. She is afraid of curbs. She has some dream enactments. These are few and far between. She is taking a small dose of clonazepam, 0.25 mg and took 0.5 mg for years. She feels her memory is worse. Her mood is stable. She tries to walk regularly. She tries to eat right. Overall, she feels that she has not had a significant decline in her function. She does worry about the future however.  Previously:  I saw her  on 04/17/2014, at which time she reported more freezing and start hesitation. She had not followed recently. She had more fatigue. She was not drinking enough water. She was exercising regularly she was having bilateral hip pain. I suggested we increase her Sinemet to 4 times a day, at 7, 11, 3 PM and 8 PM.  In the interim, she was seen by Dr. Oneida Alar in sports medicine for left-sided piriformis syndrome. I reviewed his office notes from 05/27/2014 as well as 07/02/2014. Her Cymbalta was increased.  I saw her on 01/14/2014, at which time she reported doing well after her right total knee replacement in September 2015 and she was in physical therapy through her orthopedics office. She was on Sinemet 3 times a day. She did notice that her posture was worse. Sometimes she had confusion. I did not make medication changes at the time. She presents for a sooner than scheduled appointment secondary to more fatigue and balance issues.   I saw her on 07/30/2013, at which time she felt her PD symptoms were stable. She was taking Sinemet 1 pill 3 times a day. She was on half a pill of clonazepam, 0.25 mg each night. She was on Cymbalta 30 mg daily per her psychiatrist, Dr. Caprice Beaver. She had finished her study at [facility]. I asked her to start using a cane for safety. I did not make any changes to her medications at the time.   I first met her on 01/30/2013, at which time it felt her  exam was stable. She had participated in a study at [facility] which included taking a DaT scan. She had been taking Sinemet 3 times a day but was taking it with her meals. The only thing I suggested at the time of her first visit was to take the Sinemet the way from her mealtimes to ensure better absorption. In the interim, her husband called back stating that she had been experiencing some nausea. Her primary care physician reduced her Sinemet to half a pill 3 times a day. I talked to the patient in April and suggested that she continue with half a  pill 3 times a day and try taking a whole pill 3 times a day if she noted an increase in her parkinsonian symptoms.   She previously followed with Dr. Morene Antu and was last seen by him on 12/06/2011, at which time he recommended a DaT scan and a trial of Sinemet to help differentiate her symptoms and help diagnose Parkinson's disease. She was advised that before she could have a DaT scan she would have to come off of Celexa. She did not end up having that test, as she enrolled in a study at [place]. She was supposed to followup after a couple of months, but did not. However, in the interim she started going to [employer] and got into a clinical trial for Parkinson's there. She was placed on Requip, then Sinemet. Requip caused severe sleepiness. She finished the study at [facility] under Dr. Maxine Glenn and as part of the study, she had 2 DaT scans. She did not take any study drugs. She reported severe R knee pain and had injections into her knee. She may need a knee replacement surgery, but is afraid. She goes to [employer] at [employer]. She lives at [place] with her husband in independent living and still drives. Her husband states she is a good driver. She has fatigue. She has seen Dr. Oneida Alar in sports medicine for her arthritis.    She was initially seen by Dr. Erling Cruz on 05/15/2002 for pain in her hips with evidence of lumbar spinal radiculopathy. She had degenerative disc disease. 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 She did well with conservative therapy. She has a long-standing history of back pain and was treated by Dr. Niel Hummer, who recommended she use amitriptyline. She has a over a 5 year history of deteriorating handwriting. Her balance has been declining as well. She has to hold onto the rail, going up and down steps. She has fallen infrequently. Her husband and her son started noticing that  she shuffles her feet while walking. She started complaining of fatigue and lack of energy. She has remained fairly independent in her ADLs. She has problems with her memory. She denies a family history of tremor. She has long-standing loss of sense of smell. She does not have constipation or acting out dreams. She denies bowel or bladder incontinence. She has been seen by Dr. Tamala Julian for palpitations. CMP,CBC, TSH, T4, and B12 were normal in 2011 and 2012. She had been on doses of vitamin B6 200 to 100 mg per day. Her vitamin B6 level 08/13/2010 was greater than 100. Her B12 was 695. She discontinued B6. EMG/NCV 8./16/12 was normal except for lumbosacral deinnervation, compatible with lumbosacral radiculopathy. MRI brain from 08/26/2010 showed chronic microvascular changes and mild atrophy. She has seen Dr. Casimiro Needle for depression  and was changed from citalopram to sertraline 50 mg daily without any benefit and then started seeing Dr. Harley Alto at [facility]. She went off of tramadol because of the possibility of serotonergic syndrome, but restarted it. She has been in counseling. She denies numbness in her feet and legs. She describes involuntary movements. She has right knee pain. Basic metabolic panel normal 04/27/29. A course of physical therapy for her gait did not help.    Her Past Medical History Is Significant For: Past Medical History  Diagnosis Date  . Hypertension   . Blood type, Rh negative   . Endocervical polyp   . Parkinson disease ([employer])   . Osteopenia 05/2013    T score -1.5  . Insomnia   . Anxiety   . IBS (irritable bowel syndrome)   . GERD (gastroesophageal reflux disease)   . Diverticulosis   . Lymphedema   . Chronic fatigue   . Hypothyroidism   . Depression   . Neuromuscular disorder ([place])     Parkinson's disease   . Arthritis     knee & back   . Thyroid cancer ([place])   . Breast cancer ([place])   . RBBB 10/07/2013    Her Past Surgical History Is Significant For: Past Surgical  History  Procedure Laterality Date  . Breast surgery  1979    MASTECTOMY AND RECONSTRUCTION  . Appendectomy  1979  . Tubal ligation    . Dilation and curettage of uterus      MISCARRIAGE  . Tonsillectomy    . Hysteroscopy  [id number]    AND D&C  . Thyroidectomy    . Mastectomy Bilateral   . Eye surgery Bilateral     w/ IOL  . Total knee arthroplasty Right 10/07/2013    Procedure: RIGHT TOTAL KNEE ARTHROPLASTY;  Surgeon: Kerin Salen, MD;  Location: [facility];  Service: Orthopedics;  Laterality: Right;    Her Family History Is Significant For: Family History  Problem Relation Age of Onset  . Hypertension Mother   . Hyperlipidemia Mother   . Hypertension Father   . Hyperlipidemia Father   . Hyperlipidemia Brother   . Hypertension Brother   . Heart failure Mother   . Heart failure Father     Her Social History Is Significant For: Social History   Social History  . Marital Status: Married    Spouse Name: N/A  . Number of Children: N/A  . Years of Education: N/A   Social History Main Topics  . Smoking status: Never Smoker   . Smokeless tobacco: Never Used  . Alcohol Use: 0.0 oz/week    0 Standard drinks or equivalent per week     Comment: rare  . Drug Use: No  . Sexual Activity: No     Comment: 1st intercourse 81 yo-Fewer than 5 partners   Other Topics Concern  . None   Social History Narrative    Her Allergies Are:  Allergies  Allergen Reactions  . Sulfa Antibiotics Rash  . Biaxin [Clarithromycin] Other (See Comments)    Pt does not remember reaction   . Ciprofloxacin Nausea And Vomiting  . Hctz [Hydrochlorothiazide] Other (See Comments)    weakness and hyponatremia  . Other Nausea Only  . Prednisone Other (See Comments)    Swelling and facial redness   . Sulfamethoxazole-Trimethoprim Other (See Comments) and Rash    Sore throat  :   Her Current Medications Are:  Outpatient Encounter Prescriptions as of 11/24/2014  Medication Sig  .  amLODipine  (NORVASC) 2.5 MG tablet Take 2.5 mg by mouth daily.  . Calcium Carb-Cholecalciferol (CALCIUM 600 + D PO) Take 1 tablet by mouth daily with lunch.  . carbidopa-levodopa (SINEMET IR) 25-100 MG tablet Take 1 tablet by mouth 4 (four) times daily.  . Cholecalciferol (VITAMIN D3) 2000 UNITS TABS Take 2,000 Units by mouth daily after lunch.   . clonazePAM (KLONOPIN) 0.5 MG tablet Take 0.25 mg by mouth at bedtime.   . Cranberry (SM CRANBERRY) 300 MG tablet Take 300 mg by mouth daily.  . diclofenac sodium (VOLTAREN) 1 % GEL Apply 2 g topically 4 (four) times daily.  . DULoxetine (CYMBALTA) 30 MG capsule Take 1 capsule (30 mg total) by mouth 2 (two) times daily.  . irbesartan (AVAPRO) 75 MG tablet Take 75 mg by mouth at bedtime.   Marland Kitchen levothyroxine (SYNTHROID, LEVOTHROID) 112 MCG tablet Take 112 mcg by mouth daily before breakfast.  . meloxicam (MOBIC) 7.5 MG tablet Take by mouth daily.  . Meth-Hyo-M Bl-Na Phos-Ph Sal (URIBEL) 118 MG CAPS Take up to 4 times per day  . Multiple Vitamin (MULTIVITAMIN WITH MINERALS) TABS tablet Take 1 tablet by mouth daily. Women's Over 71  . NONFORMULARY OR COMPOUNDED ITEM Insert 1 ml (1 syringe) into the vagina at bedtime 3 times a week as directed.  . raloxifene (EVISTA) 60 MG tablet TAKE 1 TABLET (60 MG TOTAL) BY MOUTH DAILY.  Marland Kitchen TURMERIC PO Take by mouth.  . vitamin C (ASCORBIC ACID) 500 MG tablet Take 500 mg by mouth daily.   No facility-administered encounter medications on file as of 11/24/2014.  :  Review of Systems:  Out of a complete 14 point review of systems, all are reviewed and negative with the exception of these symptoms as listed below:   Review of Systems  Neurological:       Patient would like to discuss her anxiety. States that she has a vibration sensation in her head a times with fogginess and confusion. Has had some freezing spells.     Objective:  Neurologic Exam  Physical Exam Physical Examination:   Filed Vitals:   11/24/14 1255   BP: 118/78  Pulse: 72  Resp: 14   General Examination: The patient is a very pleasant 81 y.o. female in no acute distress.  HEENT: Normocephalic, atraumatic, pupils are equal, round and reactive to light and accommodation. Extraocular tracking shows minimal saccadic breakdown without nystagmus noted. There is no limitation to her gaze. There is mild decrease in eye blink rate. Hearing is intact. Face is symmetric with minimal facial masking and normal facial sensation. There is no lip, neck or jaw tremor. Neck is mildly rigid with intact passive ROM. There are no carotid bruits on auscultation. Oropharynx exam reveals mild to moderate mouth dryness. No significant airway crowding is noted. Mallampati is class II. Tongue protrudes centrally and palate elevates symmetrically. There is no drooling. R shoulder is lower than L, same.   Chest: is clear to auscultation without wheezing, rhonchi or crackles noted.  Heart: sounds are regular and normal without murmurs, rubs or gallops noted.   Abdomen: is soft, non-tender and non-distended with normal bowel sounds appreciated on auscultation.  Extremities: There is no pitting edema in the distal lower extremities bilaterally. Pedal pulses are intact. There are no varicose veins.  Skin: is warm and dry with no trophic changes noted. Age-related changes are noted on the skin. She has a spot on the dorsum of the right hand which is healing.  Musculoskeletal: exam reveals no obvious joint deformities, tenderness, joint swelling or erythema.   Neurologically:  Mental status: The patient is awake and alert, paying good attention. She is able to provide the history. Her husband provides more details. She is oriented to: person, place, time/date, situation, day of week, month of year and year. Her memory, attention, language and knowledge are fairly well preserved. There is no aphasia, agnosia, apraxia or anomia. There is a mild degree of bradyphrenia. Speech  is mildly hypophonic with no dysarthria noted. Mood is congruent and affect is normal.    Cranial nerves are as described above under HEENT exam. In addition, shoulder shrug is normal with equal shoulder height noted.  Motor exam: Normal bulk, and strength for age is noted. There are no dyskinesias noted. Tone is very mildly rigid really only in the right upper extremity with some cogwheeling. There is overall mild bradykinesia. There is no drift or rebound.  There is no tremor.   Romberg is negative.  Reflexes are 1+ in the upper extremities and 1+ in the lower extremities.   Fine motor skills exam: Finger taps are mildly impaired on the right and minimally impaired on the left. Hand movements are mildly impaired on the right and minimally impaired on the left. RAP (rapid alternating patting) is minimally impaired on the right and not impaired on the left. Foot taps are mildly impaired on the right and minimally impaired on the left. Foot agility (in the form of heel stomping) is minimally impaired on the right and not impaired on the left.    Cerebellar testing shows no dysmetria or intention tremor on finger to nose testing. There is no truncal or gait ataxia.   Sensory exam is intact.   Gait, station and balance: She stands up from the seated position with mild difficulty and does need to push up with Her hands. She needs no assistance. No veering to one side is noted. She c/o pain in the left hip and both buttocks. She complains of some low back pain. Posture is mild to moderately stooped. She has a little start hesitation but no freezing. She turns in 3 steps.Tandem walk is not possible. Balance is mildly impaired.      Assessment and Plan:    In summary, KLYN KROENING is a very pleasant 81 year old female with a history of parkinsonism, most likely R sided predominant Parkinson's disease, akinetic-rigid type. Her physical exam is stable. All in all she has had milder findings and we had  increased her Sinemet several months ago because of difficulty with her posture and gait initiation as well as more freezing reported. She felt the increase in Sinemet was helpful. Overall, she has remained stable. She is reassured in that regard. She is advised to continue with Sinemet 4 times a day. She is encouraged to walk regularly and use a cane for safety specially when she is going to have to maneuver curbs. She does continue with water aerobics 3 times a week typically. She is encouraged to maintain a healthy lifestyle in general. I encouraged the patient to eat healthy, exercise daily and keep well hydrated, to keep a scheduled bedtime and wake time routine, to not skip any meals and eat healthy snacks in between meals and to have protein with every meal. In particular, I stressed the importance of regular exercise, within of course the patient's own mobility limitations. They had multiple questions today which I answered. She is encouraged to use a cane  when they go outside. I suggested a 6 month follow-up, sooner if needed. I renewed the prescription for Sinemet. She is on low-dose clonazepam and I encouraged her to continue with this. Most of my 40 minute visit today was spent in counseling and coordination of care, reviewing test results and reviewing medication and the diagnosis of Parkinson's disease, its prognosis and treatment options.

## 2015-01-02 ENCOUNTER — Other Ambulatory Visit: Payer: Self-pay | Admitting: *Deleted

## 2015-01-02 MED ORDER — NONFORMULARY OR COMPOUNDED ITEM: Status: DC

## 2015-01-02 NOTE — Telephone Encounter (Signed)
Per note "Patient using point formulated vaginal cream [facility]. Doing well with this and wants to continue. Again reviewed the unusual nature of using estrogen cream and a breast cancer survivor. The potential for absorption stimulation of disease discussed. We've had this discussion on multiple occasions and she understands the issues. I also discussed possible risks as far as endometrial stimulation and thrombosis risks. Patient comfortable continuing and will call when she needs refill.  Rx called in.

## 2015-01-12 ENCOUNTER — Other Ambulatory Visit: Payer: Self-pay | Admitting: *Deleted

## 2015-01-12 MED ORDER — DULOXETINE HCL 30 MG PO CPEP: 30.0000 mg | ORAL_CAPSULE | Freq: Two times a day (BID) | ORAL | Status: DC

## 2015-05-20 ENCOUNTER — Encounter: Payer: Self-pay | Admitting: Women's Health

## 2015-05-20 ENCOUNTER — Ambulatory Visit (INDEPENDENT_AMBULATORY_CARE_PROVIDER_SITE_OTHER): Payer: BLUE CROSS/BLUE SHIELD | Admitting: Women's Health

## 2015-05-20 VITALS — BP 118/80 | Ht 62.0 in | Wt 136.0 lb

## 2015-05-20 DIAGNOSIS — R300 Dysuria: Secondary | ICD-10-CM

## 2015-05-20 DIAGNOSIS — N898 Other specified noninflammatory disorders of vagina: Secondary | ICD-10-CM | POA: Diagnosis not present

## 2015-05-20 DIAGNOSIS — N3001 Acute cystitis with hematuria: Secondary | ICD-10-CM | POA: Diagnosis not present

## 2015-05-20 LAB — WET PREP FOR TRICH, YEAST, CLUE
CLUE CELLS WET PREP: NONE SEEN
TRICH WET PREP: NONE SEEN
WBC WET PREP: NONE SEEN
Yeast Wet Prep HPF POC: NONE SEEN

## 2015-05-20 LAB — URINALYSIS W MICROSCOPIC + REFLEX CULTURE
BILIRUBIN URINE: NEGATIVE
CASTS: NONE SEEN [LPF]
GLUCOSE, UA: NEGATIVE
NITRITE: NEGATIVE
PH: 5.5 (ref 5.0–8.0)
SPECIFIC GRAVITY, URINE: 1.025 (ref 1.001–1.035)
Yeast: NONE SEEN [HPF]

## 2015-05-20 MED ORDER — AMPICILLIN 250 MG PO CAPS: 250.0000 mg | ORAL_CAPSULE | Freq: Three times a day (TID) | ORAL | Status: DC

## 2015-05-20 NOTE — Patient Instructions (Signed)

## 2015-05-20 NOTE — Addendum Note (Signed)
Addended by: Burnett Kanaris on: 05/20/2015 11:09 AM   Modules accepted: Orders

## 2015-05-20 NOTE — Progress Notes (Signed)
Patient ID: Michelle Sims, female   DOB: [birth year], 82 y.o.   MRN: [id number] Presents with complaint of increased urinary frequency, urgency, slight burning for several days. Reports mild leakage of urine only upon arising at night, none during the day. Mild external vaginal irritation, not sexually active. Denies back pain, abdominal pain or fever. Reports only urinating small amounts.  Exam: Appears well. External genitalia mild erythema at introitus, wet prep with Q-tip negative negative. UA: +3 blood, +2 leukocytes, 40-60 WBCs, packed RBCs, many bacteria. Color dark yellow  UTI  Plan: Has numerous allergies, has taken Macrobid in the past with only nausea prefers something different will try ampicillin 250 mg 3 times a day for 4 days, instructed to call if no relief. Urine culture pending. UTI prevention discussed, reviewed importance of increasing fluids/water.

## 2015-05-23 LAB — URINE CULTURE

## 2015-05-25 ENCOUNTER — Encounter: Payer: Self-pay | Admitting: Neurology

## 2015-05-25 ENCOUNTER — Ambulatory Visit (INDEPENDENT_AMBULATORY_CARE_PROVIDER_SITE_OTHER): Payer: BLUE CROSS/BLUE SHIELD | Admitting: Neurology

## 2015-05-25 VITALS — BP 116/62 | HR 78 | Resp 16 | Ht 62.0 in | Wt 133.0 lb

## 2015-05-25 DIAGNOSIS — G20 Parkinson's disease: Secondary | ICD-10-CM

## 2015-05-25 MED ORDER — CARBIDOPA-LEVODOPA 25-100 MG PO TABS: ORAL_TABLET | ORAL | Status: DC

## 2015-05-25 NOTE — Patient Instructions (Signed)
We will increase your Sinemet to 2 pills for the first dose, then 1 pill for the other 3 doses.   You are still doing well.   Please continue to exercise and drink more water.   You can start coenzyme Q 10 supplements. As discussed, high dose Co Q 10 was studied: 400 mg 3 times a day.

## 2015-05-25 NOTE — Progress Notes (Signed)
Subjective:    Patient ID: Michelle Sims is a 82 y.o. female.  HPI     Interim history:   Michelle Sims is a very pleasant 82 year old right-handed woman an underlying medical history of chronic back pain, hypertension, thyroid cancer, breast cancer, status post thyroidectomy, tonsillectomy, appendectomy, tubal ligation and mastectomy with reconstruction, who presents for followup consultation of her parkinsonism, probable right-sided predominant Parkinson's disease of the akinetic-rigid type. She is accompanied by her husband again today. I last saw her on 11/24/2014, at which time she reported that she was trying to continue with water aerobics and her walking exercises. She did not like the spinning class at the [facility]. She had some visual illusions but no frank hallucinations. She has not fallen but was fearful of falling. She had some dream enactments. These were few and far between. She was on a small dose of clonazepam in the past and took 0.5 mg for years. She felt that her memory was worse mood was stable. Overall, she felt that she had not progressed very much but she did for about the future. I suggested she continue with Sinemet 1 pill 4 times a day.  Today, 05/25/2015: She reports not feeling too well overall, more fatigue, more slowness, more cognitive issue, less confident with walking. No falls, does not drink enough. No choking or coughing, speaking more softly. Still has pain in both hips and buttocks.   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 She bruised her knees. She was not using a cane at the time. Her husband noted that she would probably do better using a cane. She had blood work with her primary care physician which per husband was mostly fine. She was not drinking enough water. She was complaining of fatigue. She was not always sleeping well. She was not always rested. She had not tried melatonin  yet. I suggested she try to exercise regularly. For sleep, I suggested she try melatonin. I asked her to drink more water and add more fiber to her diet. [facility].   I saw her on 04/17/2014, at which time she reported more freezing and start hesitation. She had not followed recently. She had more fatigue. She was not drinking enough water. She was exercising regularly she was having bilateral hip pain. I suggested we increase her Sinemet to 4 times a day, at 7, 11, 3 PM and 8 PM.  In the interim, she was seen by Dr. Oneida Alar in sports medicine for left-sided piriformis syndrome. I reviewed his office notes from 05/27/2014 as well as 07/02/2014. Her Cymbalta was increased.  I saw her on 01/14/2014, at which time she reported doing well after her right total knee replacement in September 2015 and she was in physical therapy through her orthopedics office. She was on Sinemet 3 times a day. She did notice that her posture was worse. Sometimes she had confusion. I did not make medication changes at the time. She presents for a sooner than scheduled appointment secondary to more fatigue and balance issues.   I saw her on 07/30/2013, at which time she felt her PD symptoms were stable. She was taking Sinemet 1 pill 3 times a day. She was on half a pill of clonazepam, 0.25 mg each night. She was on Cymbalta 30 mg daily per her psychiatrist,  Dr. Caprice Beaver. She had finished her study at [facility]. I asked her to start using a cane for safety. I did not make any changes to her medications at the time.   I first met her on 01/30/2013, at which time it felt her exam was stable. She had participated in a study at [facility] which included taking a DaT scan. She had been taking Sinemet 3 times a day but was taking it with her meals. The only thing I suggested at the time of her first visit was to take the Sinemet the way from her mealtimes to ensure better  absorption. In the interim, her husband called back stating that she had been experiencing some nausea. Her primary care physician reduced her Sinemet to half a pill 3 times a day. I talked to the patient in April and suggested that she continue with half a pill 3 times a day and try taking a whole pill 3 times a day if she noted an increase in her parkinsonian symptoms.   She previously followed with Dr. Morene Antu and was last seen by him on 12/06/2011, at which time he recommended a DaT scan and a trial of Sinemet to help differentiate her symptoms and help diagnose Parkinson's disease. She was advised that before she could have a DaT scan she would have to come off of Celexa. She did not end up having that test, as she enrolled in a study at [place]. She was supposed to followup after a couple of months, but did not. However, in the interim she started going to [employer] and got into a clinical trial for Parkinson's there. She was placed on Requip, then Sinemet. Requip caused severe sleepiness. She finished the study at [facility] under Dr. Maxine Glenn and as part of the study, she had 2 DaT scans. She did not take any study drugs. She reported severe R knee pain and had injections into her knee. She may need a knee replacement surgery, but is afraid. She goes to [employer] at [employer]. She lives at [place] with her husband in independent living and still drives. Her husband states she is a good driver. She has fatigue. She has seen Dr. Oneida Alar in sports medicine for her arthritis.    She was initially seen by Dr. Erling Cruz on 05/15/2002 for pain in her hips with evidence of lumbar spinal radiculopathy. She had degenerative disc disease. 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 She did well with conservative therapy. She has a long-standing history of back pain and was treated by Dr. Niel Hummer, who recommended  she use amitriptyline. She has a over a 5 year history of deteriorating handwriting. Her balance has been declining as well. She has to hold onto the rail, going up and down steps. She has fallen infrequently. Her husband and her son started noticing that she shuffles her feet while walking. She started complaining of fatigue and lack of energy. She has remained fairly independent in her ADLs. She has problems with her memory. She denies a family history of tremor. She has long-standing loss of sense of smell. She does not have constipation or acting out dreams. She denies bowel or bladder incontinence. She has been seen by Dr. Tamala Julian for palpitations. CMP,CBC, TSH, T4, and B12 were normal in 2011 and 2012. She had been on doses of vitamin B6 200 to 100 mg per day. Her  vitamin B6 level 08/13/2010 was greater than 100. Her B12 was 695. She discontinued B6. EMG/NCV 8./16/12 was normal except for lumbosacral deinnervation, compatible with lumbosacral radiculopathy. MRI brain from 08/26/2010 showed chronic microvascular changes and mild atrophy. She has seen Dr. Casimiro Needle for depression and was changed from citalopram to sertraline 50 mg daily without any benefit and then started seeing Dr. Harley Alto at [facility]. She went off of tramadol because of the possibility of serotonergic syndrome, but restarted it. She has been in counseling. She denies numbness in her feet and legs. She describes involuntary movements. She has right knee pain. Basic metabolic panel normal 1/0/17. A course of physical therapy for her gait did not help.    Her Past Medical History Is Significant For: Past Medical History  Diagnosis Date  . Hypertension   . Blood type, Rh negative   . Endocervical polyp   . Parkinson disease ([place])   . Osteopenia 05/2013    T score -1.5  . Insomnia   . Anxiety   . IBS (irritable bowel syndrome)   . GERD (gastroesophageal reflux disease)   . Diverticulosis   . Lymphedema   . Chronic fatigue   .  Hypothyroidism   . Depression   . Neuromuscular disorder ([place])     Parkinson's disease   . Arthritis     knee & back   . Thyroid cancer ([place])   . Breast cancer ([place])   . RBBB 10/07/2013    Her Past Surgical History Is Significant For: Past Surgical History  Procedure Laterality Date  . Breast surgery  1979    MASTECTOMY AND RECONSTRUCTION  . Appendectomy  1979  . Tubal ligation    . Dilation and curettage of uterus      MISCARRIAGE  . Tonsillectomy    . Hysteroscopy  [id number]    AND D&C  . Thyroidectomy    . Mastectomy Bilateral   . Eye surgery Bilateral     w/ IOL  . Total knee arthroplasty Right 10/07/2013    Procedure: RIGHT TOTAL KNEE ARTHROPLASTY;  Surgeon: Kerin Salen, MD;  Location: [place];  Service: Orthopedics;  Laterality: Right;    Her Family History Is Significant For: Family History  Problem Relation Age of Onset  . Hypertension Mother   . Hyperlipidemia Mother   . Hypertension Father   . Hyperlipidemia Father   . Hyperlipidemia Brother   . Hypertension Brother   . Heart failure Mother   . Heart failure Father     Her Social History Is Significant For: Social History   Social History  . Marital Status: Married    Spouse Name: N/A  . Number of Children: N/A  . Years of Education: N/A   Social History Main Topics  . Smoking status: Never Smoker   . Smokeless tobacco: Never Used  . Alcohol Use: 0.0 oz/week    0 Standard drinks or equivalent per week     Comment: rare  . Drug Use: No  . Sexual Activity: No     Comment: 1st intercourse 82 yo-Fewer than 5 partners   Other Topics Concern  . None   Social History Narrative    Her Allergies Are:  Allergies  Allergen Reactions  . Sulfa Antibiotics Rash  . Biaxin [Clarithromycin] Other (See Comments)    Pt does not remember reaction   . Ciprofloxacin Nausea And Vomiting  . Hctz [Hydrochlorothiazide] Other (See Comments)    weakness and hyponatremia  . Macrobid [Nitrofurantoin] Nausea  Only  . Other Nausea Only  . Prednisone Other (See Comments)    Swelling and facial redness   . Sulfamethoxazole-Trimethoprim Other (See Comments) and Rash    Sore throat  :   Her Current Medications Are:  Outpatient Encounter Prescriptions as of 05/25/2015  Medication Sig  . amLODipine (NORVASC) 2.5 MG tablet Take 2.5 mg by mouth daily.  . Calcium Carb-Cholecalciferol (CALCIUM 600 + D PO) Take 1 tablet by mouth daily with lunch.  . carbidopa-levodopa (SINEMET IR) 25-100 MG tablet Take 1 tablet by mouth 4 (four) times daily.  . Cholecalciferol (VITAMIN D3) 2000 UNITS TABS Take 2,000 Units by mouth daily after lunch.   . clonazePAM (KLONOPIN) 0.5 MG tablet Take 0.25 mg by mouth at bedtime.   . Cranberry (SM CRANBERRY) 300 MG tablet Take 300 mg by mouth daily.  . DULoxetine (CYMBALTA) 30 MG capsule Take 1 capsule (30 mg total) by mouth 2 (two) times daily.  . irbesartan (AVAPRO) 75 MG tablet Take 75 mg by mouth at bedtime.   Marland Kitchen levothyroxine (SYNTHROID, LEVOTHROID) 112 MCG tablet Take 112 mcg by mouth daily before breakfast.  . Multiple Vitamin (MULTIVITAMIN WITH MINERALS) TABS tablet Take 1 tablet by mouth daily. Women's Over 15  . NONFORMULARY OR COMPOUNDED ITEM Estradiol 0.02 % (1 syringe) into the vagina at bedtime 2 times a week as directed.  . raloxifene (EVISTA) 60 MG tablet TAKE 1 TABLET (60 MG TOTAL) BY MOUTH DAILY.  . vitamin C (ASCORBIC ACID) 500 MG tablet Take 500 mg by mouth daily.  . [DISCONTINUED] ampicillin (PRINCIPEN) 250 MG capsule Take 1 capsule (250 mg total) by mouth 3 (three) times daily.  . [DISCONTINUED] diclofenac sodium (VOLTAREN) 1 % GEL Apply 2 g topically 4 (four) times daily.  . [DISCONTINUED] meloxicam (MOBIC) 7.5 MG tablet Take by mouth daily.  . [DISCONTINUED] Meth-Hyo-M Bl-Na Phos-Ph Sal (URIBEL) 118 MG CAPS Take up to 4 times per day  . [DISCONTINUED] TURMERIC PO Take by mouth.   No facility-administered encounter medications on file as of 05/25/2015.   :  Review of Systems:  Out of a complete 14 point review of systems, all are reviewed and negative with the exception of these symptoms as listed below:   Review of Systems  Neurological:       Patient would like to know if it is ok to take Co Q 10. Patient also has more concerns written down.     Objective:  Neurologic Exam  Physical Exam Physical Examination:   Filed Vitals:   05/25/15 1301  BP: 116/62  Pulse: 78  Resp: 16   General Examination: The patient is a very pleasant 82 y.o. female in no acute distress.  HEENT: Normocephalic, atraumatic, pupils are equal, round and reactive to light and accommodation. Extraocular tracking shows minimal saccadic breakdown without nystagmus noted. There is no limitation to her gaze. There is mild decrease in eye blink rate. Hearing is intact. Face is symmetric with minimal facial masking and normal facial sensation. There is no lip, neck or jaw tremor. Neck is mildly rigid with intact passive ROM. There are no carotid bruits on auscultation. Oropharynx exam reveals moderate mouth dryness. No significant airway crowding is noted. Mallampati is class II. Tongue protrudes centrally and palate elevates symmetrically. There is no drooling. R shoulder is lower than L, same.   Chest: is clear to auscultation without wheezing, rhonchi or crackles noted.  Heart: sounds are regular and normal without murmurs, rubs or gallops noted.  Abdomen: is soft, non-tender and non-distended with normal bowel sounds appreciated on auscultation.  Extremities: There is no pitting edema in the distal lower extremities bilaterally. Pedal pulses are intact. There are no varicose veins.  Skin: is warm and dry with no trophic changes noted. Age-related changes are noted on the skin. She has a skin lesion on the L wrist, bandaged.   Musculoskeletal: exam reveals no obvious joint deformities, tenderness, joint swelling or erythema.   Neurologically:  Mental  status: The patient is awake and alert, paying good attention. She is able to provide the history. Her husband provides more details. She is oriented to: person, place, time/date, situation, day of week, month of year and year. Her memory, attention, language and knowledge are fairly well preserved. There is no aphasia, agnosia, apraxia or anomia. There is a mild degree of bradyphrenia. Speech is mild to moderately hypophonic with no dysarthria noted. Mood is congruent and affect is blunted.    Cranial nerves are as described above under HEENT exam. In addition, shoulder shrug is normal with equal shoulder height noted.  Motor exam: Normal bulk, and strength for age is noted. There are no dyskinesias noted. Tone is very mildly rigid really only in the right upper extremity only with some cogwheeling. There is overall mild bradykinesia. There is no drift or rebound.  There is no tremor.   Romberg is negative.  Reflexes are 1+ in the upper extremities and 1+ in the lower extremities.   Fine motor skills exam: Finger taps are mildly impaired on the right and minimally impaired on the left. Hand movements are mildly impaired on the right and minimally impaired on the left. RAP (rapid alternating patting) is minimally impaired on the right and not impaired on the left. Foot taps are mildly impaired on the right and minimally impaired on the left. Foot agility (in the form of heel stomping) is mildly impaired on the right and mildly impaired on the right and minimally so on the left.    Cerebellar testing shows no dysmetria or intention tremor on finger to nose testing. There is no truncal or gait ataxia.   Sensory exam is intact.   Gait, station and balance: She stands up from the seated position with mild to moderate difficulty and does need to push herself up with her hands. Posture is moderately stooped, she does not need assistance. She complains of mild low back stiffness. She has also start  hesitation, no actual freezing, no festination, tandem walk is not possible, she walks with decreased arm swing on the right. Balance is mildly impaired.      Assessment and Plan:    In summary, DEZI BRAUNER is a very pleasant 82 year old female with a history of parkinsonism, most likely R sided predominant Parkinson's disease, akinetic-rigid type. Her physical exam has been stable for the most part. All in all she has had milder findings and we had increased her Sinemet to 4 times a day. She felt the increase in Sinemet was helpful. Overall, she has remained Fairly stable over the course of time. She feels worse in the morning, better towards the afternoon and evening. She does not always drink enough water. Was recently treated for urinary tract infection per husband. She has been exercising regularly and participates in several different classes at [facility] and also water aerobics. She is advised to consider using a cane for outside use. She is advised to continue staying active mentally and physically, she is advised about  the memory issues that people can develop with advancing Parkinson symptoms. She is not to the point of dementia. We will also monitor her blood pressure values. She is advised to drink more water. We will increase her morning dose of Sinemet to 2 pills and then keep the other 3 doses at 1 pill for a total of 4 doses, 5 pills daily. They had multiple questions today which I answered. I suggested a 6 month follow-up, sooner if needed. I adjusted her prescription for Sinemet. She is on low-dose clonazepam and I encouraged her to continue with this. Most of my 40 minute visit today was spent in counseling and coordination of care, reviewing test results and reviewing medication and the diagnosis of Parkinson's disease, its prognosis and treatment options.

## 2015-05-27 ENCOUNTER — Telehealth: Payer: Self-pay | Admitting: Neurology

## 2015-05-27 DIAGNOSIS — G20 Parkinson's disease: Secondary | ICD-10-CM

## 2015-05-27 NOTE — Telephone Encounter (Signed)
Okay to reduce sinemet back to 4 times a day for a week, then could try 1 tab 5 times a day, such as 7, 10, 1 pm, 4 pm and 7 pm, if she wants to try.

## 2015-05-27 NOTE — Telephone Encounter (Signed)
I spoke to patient. She has tried the increased does of C/L for 2 days (2 tab in am, then 1 tab 3x after). Michelle Sims reports that she has not felt well, feels shaky and foggy headed. She cancelled some of her activities today due to not feeling well. She asks if she can go back to taking 1 tab four times a day?

## 2015-05-27 NOTE — Telephone Encounter (Signed)
Pt's husband called said if he can not be reached the pt will only be at home until 5:15 today.

## 2015-05-27 NOTE — Telephone Encounter (Signed)
I spoke to patient and she is willing to proceed with recommendation below. She will keep [place] informed on how she is doing.

## 2015-05-27 NOTE — Telephone Encounter (Signed)
Patient's husband is calling. The patient was seen on Monday by Dr. Rexene Alberts and medication carbidopa-levodopa (SINEMET IR) 25-100 MG tablet was increased. Today the patient is foggy headed and feels lousy. Please call and discuss. Please call before 4 or call the patient at [phone].

## 2015-05-29 ENCOUNTER — Other Ambulatory Visit: Payer: Self-pay

## 2015-06-02 ENCOUNTER — Other Ambulatory Visit: Payer: Self-pay | Admitting: Internal Medicine

## 2015-06-02 DIAGNOSIS — M8580 Other specified disorders of bone density and structure, unspecified site: Secondary | ICD-10-CM

## 2015-06-02 DIAGNOSIS — Z1231 Encounter for screening mammogram for malignant neoplasm of breast: Secondary | ICD-10-CM

## 2015-06-03 ENCOUNTER — Other Ambulatory Visit: Payer: Self-pay | Admitting: *Deleted

## 2015-06-03 ENCOUNTER — Other Ambulatory Visit: Payer: BLUE CROSS/BLUE SHIELD

## 2015-06-03 DIAGNOSIS — R8271 Bacteriuria: Secondary | ICD-10-CM

## 2015-06-04 LAB — URINALYSIS W MICROSCOPIC + REFLEX CULTURE
BACTERIA UA: NONE SEEN [HPF]
BILIRUBIN URINE: NEGATIVE
Casts: NONE SEEN [LPF]
Crystals: NONE SEEN [HPF]
GLUCOSE, UA: NEGATIVE
HGB URINE DIPSTICK: NEGATIVE
Ketones, ur: NEGATIVE
Leukocytes, UA: NEGATIVE
Nitrite: NEGATIVE
PROTEIN: NEGATIVE
RBC / HPF: NONE SEEN RBC/HPF (ref <=2)
Specific Gravity, Urine: 1.016 (ref 1.001–1.035)
WBC, UA: NONE SEEN WBC/HPF (ref <=5)
Yeast: NONE SEEN [HPF]
pH: 7.5 (ref 5.0–8.0)

## 2015-06-30 ENCOUNTER — Telehealth: Payer: Self-pay

## 2015-06-30 NOTE — Telephone Encounter (Signed)
Patient dropped off release form for [employer] at [employer]. It is signed and I have mailed copy to patient and original to [employer].

## 2015-07-07 ENCOUNTER — Other Ambulatory Visit: Payer: Self-pay | Admitting: *Deleted

## 2015-07-07 MED ORDER — DULOXETINE HCL 30 MG PO CPEP: 30.0000 mg | ORAL_CAPSULE | Freq: Two times a day (BID) | ORAL | Status: DC

## 2015-07-10 ENCOUNTER — Ambulatory Visit
Admission: RE | Admit: 2015-07-10 | Discharge: 2015-07-10 | Disposition: A | Discharge status: Home / Self Care | Payer: BLUE CROSS/BLUE SHIELD | Source: Ambulatory Visit | Attending: Internal Medicine | Admitting: Internal Medicine

## 2015-07-10 DIAGNOSIS — Z1231 Encounter for screening mammogram for malignant neoplasm of breast: Secondary | ICD-10-CM

## 2015-07-10 DIAGNOSIS — M8580 Other specified disorders of bone density and structure, unspecified site: Secondary | ICD-10-CM

## 2015-09-03 ENCOUNTER — Telehealth: Payer: Self-pay | Admitting: Neurology

## 2015-09-03 NOTE — Telephone Encounter (Signed)
I spoke with patient. She states that at last visit her Sinemet was increased from 4 times a day to 5. She tried it 1 day and "felt lousy". Since then patient has been taking SInemet only 4 times a day. She still complaints or fatigue and a "foggy head". Patient understand that you are out of the office until Tuesday. She would like to know how to proceed or how to work in 5 pills another way?

## 2015-09-03 NOTE — Telephone Encounter (Signed)
Patient called requesting to speak to nurse regarding her medication. Patient has questions. Please call [phone].

## 2015-09-07 NOTE — Telephone Encounter (Signed)
LM to call me back.

## 2015-09-07 NOTE — Telephone Encounter (Signed)
Pt returned call. Please call

## 2015-09-07 NOTE — Telephone Encounter (Signed)
I spoke to patient and she is aware of advice below. She was able to repeat back the directions. Patient requested sooner appt. So I moved appt from November to September.

## 2015-09-07 NOTE — Telephone Encounter (Signed)
We had previously tried C/L 2 pills in the morning and 3 pills for the rest of 3 doses for total of 5 pills, please ask her to try 1-1/2 pills alternating with one pills, therefore she will take 1-1/2 pills for her first dose, 1 pill for her second dose, 1-1/2 pills for the third dose and 1 pill for the fourth dose, try this and see how that goes.

## 2015-09-09 NOTE — Telephone Encounter (Signed)
Patient is calling. She is confused about the dosage for carbidopa-levodopa (SINEMET IR) 25-100 MG tablet. Please call to discuss.

## 2015-09-10 NOTE — Telephone Encounter (Signed)
Spoke with patient who stated she understands the dosing of pills but is unsure how to take in relation to meals. Advised she establish a routine of taking 30 min prior to each meal and at bedtime. Advised she make a chart to help her get accustomed to the routine and to make notes as needed. She stated she does make notes to help her remember. She repeated these instructions and verbalized understanding, appreciation for call back.

## 2015-09-13 ENCOUNTER — Other Ambulatory Visit: Payer: Self-pay | Admitting: Gynecology

## 2015-09-21 ENCOUNTER — Other Ambulatory Visit: Payer: Self-pay | Admitting: Internal Medicine

## 2015-09-21 ENCOUNTER — Encounter: Payer: Self-pay | Admitting: Gynecology

## 2015-09-21 ENCOUNTER — Ambulatory Visit (INDEPENDENT_AMBULATORY_CARE_PROVIDER_SITE_OTHER): Payer: BLUE CROSS/BLUE SHIELD | Admitting: Gynecology

## 2015-09-21 ENCOUNTER — Other Ambulatory Visit: Payer: Self-pay | Admitting: Gynecology

## 2015-09-21 ENCOUNTER — Ambulatory Visit
Admission: RE | Admit: 2015-09-21 | Discharge: 2015-09-21 | Disposition: A | Discharge status: Home / Self Care | Payer: BLUE CROSS/BLUE SHIELD | Source: Ambulatory Visit | Attending: Internal Medicine | Admitting: Internal Medicine

## 2015-09-21 VITALS — BP 118/74 | Ht 63.0 in | Wt 131.0 lb

## 2015-09-21 DIAGNOSIS — R140 Abdominal distension (gaseous): Secondary | ICD-10-CM

## 2015-09-21 DIAGNOSIS — M8580 Other specified disorders of bone density and structure, unspecified site: Secondary | ICD-10-CM | POA: Diagnosis not present

## 2015-09-21 DIAGNOSIS — Z01419 Encounter for gynecological examination (general) (routine) without abnormal findings: Secondary | ICD-10-CM | POA: Diagnosis not present

## 2015-09-21 DIAGNOSIS — M79671 Pain in right foot: Secondary | ICD-10-CM

## 2015-09-21 DIAGNOSIS — N39498 Other specified urinary incontinence: Secondary | ICD-10-CM

## 2015-09-21 DIAGNOSIS — M79672 Pain in left foot: Secondary | ICD-10-CM

## 2015-09-21 DIAGNOSIS — N952 Postmenopausal atrophic vaginitis: Secondary | ICD-10-CM

## 2015-09-21 MED ORDER — RALOXIFENE HCL 60 MG PO TABS: ORAL_TABLET | ORAL | 4 refills | Status: DC

## 2015-09-21 NOTE — Progress Notes (Signed)
Michelle Sims [birth year] [id number]        82 y.o.  [id number]  for breast and pelvic exam. Several issues noted below.  Past medical history,surgical history, problem list, medications, allergies, family history and social history were all reviewed and documented as reviewed in the EPIC chart.  ROS:  Performed with pertinent positives and negatives included in the history, assessment and plan.   Additional significant findings :  Abdominal bloating, urinary incontinence   Exam: Michelle Sims assistant Vitals:   09/21/15 1014  BP: 118/74  Weight: 131 lb (59.4 kg)  Height: 5\' 3"  (1.6 m)   Body mass index is 23.21 kg/m.  General appearance:  Normal affect, orientation and appearance. Skin: Grossly normal HEENT: Without gross lesions.  No cervical or supraclavicular adenopathy. Thyroid normal.  Lungs:  Clear without wheezing, rales or rhonchi Cardiac: RR, without RMG Abdominal:  Soft, nontender, without masses, guarding, rebound, organomegaly or hernia Breasts:  Examined lying and sitting. Left with implant. Without masses, retractions, discharge or axillary adenopathy.  Right status post reconstruction with implant. No masses or axillary adenopathy Pelvic:  Ext/BUS/Vagina with significant atrophic changes in narrowing of the upper vagina. First to second degree rectocele noted  Cervix not visualized due to upper vaginal narrowing. No palpable abnormalities  Uterus small difficult to palpate, no masses or tenderness  Adnexa without masses or tenderness    Anus and perineum normal   Rectovaginal normal sphincter tone without palpated masses or tenderness.    Assessment/Plan:  82 y.o. [id number] female for breast and pelvic exam.   1. Postmenopausal/atrophic genital changes. Continues to use formulated vaginal estradiol cream twice weekly. I again reviewed in detail the use of vaginal estrogen cream in a patient with a history of breast cancer. Potential for absorption and disease  stimulation discussed. Patient clearly understands the issues and feels that she is benefiting greatly from the cream and from a quality-of-life issue once to continue using this. Will refill when needed. 2. Abdominal bloating. Patient continues to complain of abdominal bloating. Has done so for years. Negative studies in the past to include a negative ultrasound approaching 2 years ago. No nausea vomiting diarrhea constipation.  Does not appear to be getting worse. I again re-offered the option for ultrasound for pelvic clearance although her exam today is totally normal. Patient declines and does not want to do this and accepts the risks of missed disease.  I reviewed with her that I think this is probably secondary to aging and loss of abdominal wall strength which is leading to her more distended appearance which again has been stable over the last several years. She will let me know if she changes her mind and wants to proceed with studies. 3. Urinary incontinence. Patient notes some dribbling at night when she gets up to go the bathroom before she can make it to the toilet. Does not occur during the day. Does not occur very often. Not associated with frequency, dysuria, urgency, low back pain. Does have a history of recurrent UTIs. Will check baseline urinalysis today. If negative then plan expectant management as patient states it is not overly bothersome to her and she would decline urology referral she does not think it's that significant. Will follow up if it appears to get worse. 4. Osteopenia. Recent DEXA 06/2015 with T score -2.4 distal third of the radius. Does appear to have lost from her prior study. Continues on Evista. Options for additional treatment to reduce fracture risk discussed.  Patient declines any further treatment such as additional bisphosphate and she does not want to add any more medications. Understands and accepts the fracture risks. 5. Rectocele. Stable on serial exams.  Asymptomatic to the patient. We'll continue to monitor. 6. Pap smear 2013. No Pap smear done today. No history of significant abnormal Pap smears. We both agree to stop screening per current screening guidelines based on age. 7. Colonoscopy 2010. Repeat at their recommended interval. 8. Health maintenance. No routine lab work done as patient has this done elsewhere.  Greater than 15 minutes of my time in excess of her breast and pelvic exam was spent in direct face to face counseling and coordination of care in regards to her problems of abdominal bloating, urinary incontinence, worsening osteopenia.     Michelle Auerbach MD, 10:47 AM 09/21/2015

## 2015-09-21 NOTE — Patient Instructions (Signed)

## 2015-09-22 LAB — URINALYSIS W MICROSCOPIC + REFLEX CULTURE
Bilirubin Urine: NEGATIVE
CASTS: NONE SEEN [LPF]
GLUCOSE, UA: NEGATIVE
Hgb urine dipstick: NEGATIVE
Ketones, ur: NEGATIVE
LEUKOCYTES UA: NEGATIVE
NITRITE: NEGATIVE
PH: 6.5 (ref 5.0–8.0)
Protein, ur: NEGATIVE
SPECIFIC GRAVITY, URINE: 1.023 (ref 1.001–1.035)
YEAST: NONE SEEN [HPF]

## 2015-09-25 LAB — URINE CULTURE

## 2015-09-29 ENCOUNTER — Telehealth: Payer: Self-pay

## 2015-09-29 ENCOUNTER — Other Ambulatory Visit: Payer: Self-pay | Admitting: Gynecology

## 2015-09-29 MED ORDER — AMPICILLIN 500 MG PO CAPS: 500.0000 mg | ORAL_CAPSULE | Freq: Four times a day (QID) | ORAL | 0 refills | Status: DC

## 2015-09-29 NOTE — Telephone Encounter (Signed)
Husband called because I contacted patient today regarding bacteria in urine and Dr. Loetta Rough recommended antibiotic. He was concerned that we had the wrong patient because she had been in on Aug 28 and we are calling her today on 9/5. I called back and left message on his voice mail.  I assured him it was correct result. I explained that Dr. Loetta Rough was waiting for the sensitivity to see what antibiotics the bacteria was sensitive to since patient has so many drug allergies. I told him Dr. Loetta Rough had just put the prescription on the result this morning and I called the hour after that.  I told him to call me if any additional concerns.

## 2015-10-14 ENCOUNTER — Ambulatory Visit (INDEPENDENT_AMBULATORY_CARE_PROVIDER_SITE_OTHER): Payer: BLUE CROSS/BLUE SHIELD | Admitting: Neurology

## 2015-10-14 ENCOUNTER — Encounter: Payer: Self-pay | Admitting: Neurology

## 2015-10-14 VITALS — BP 120/68 | HR 84 | Resp 20 | Ht 62.0 in | Wt 133.0 lb

## 2015-10-14 DIAGNOSIS — G20 Parkinson's disease: Secondary | ICD-10-CM

## 2015-10-14 MED ORDER — CARBIDOPA-LEVODOPA 25-100 MG PO TABS: ORAL_TABLET | ORAL | 3 refills | Status: DC

## 2015-10-14 NOTE — Progress Notes (Signed)
Subjective:    Patient ID: Michelle Sims is a 82 y.o. female.  HPI     Interim history:   Michelle Sims is a very pleasant 82 year old right-handed woman an underlying medical history of chronic back pain, hypertension, thyroid cancer, breast cancer, status post thyroidectomy, tonsillectomy, appendectomy, tubal ligation and mastectomy with reconstruction, who presents for followup consultation of her parkinsonism, probable right-sided predominant Parkinson's disease of the akinetic-rigid type. She is accompanied by her husband again today. I last saw her on 05/25/2015, at which time she reported not feeling too well overall, reported more fatigue, more slowness, more cognitive issues, felt less confident with walking. She reported no recent falls, she had no choking episodes. She was not drinking enough fluid she admitted. She had some hip pain bilaterally as well. I suggested a cautious increase in her Sinemet to 2 pills for the first dose then 1 for the next 3 doses for the day. We talked about coenzyme Q10 supplementation. She was encouraged to drink more water and exercise regularly.    Today, 10/14/2015: She reports Doing okay, the increase in Sinemet for the first dose to 2 pills was too much she felt. She reduced it back. She has been participating in the boxing class for Parkinson's patients. She feels confused at times. No falls, has been alternating 1 1/2 pills with 1.   Previously:   I saw her on 11/24/2014, at which time she reported that she was trying to continue with water aerobics and her walking exercises. She did not like the spinning class at the [facility]. She had some visual illusions but no frank hallucinations. She has not fallen but was fearful of falling. She had some dream enactments. These were few and far between. She was on a small dose of clonazepam in the past and took 0.5 mg for years. She felt that her memory was worse mood was stable. Overall, she felt that she had not  progressed very much but she did for about the future. I suggested she continue with Sinemet 1 pill 4 times a day.   I saw her on 07/22/2014, at which time she reported a recent fall about a week prior at the hairdresser's. She bruised her knees. She was not using a cane at the time. Her husband noted that she would probably do better using a cane. She had blood work with her primary care physician which per husband was mostly fine. She was not drinking enough water. She was complaining of fatigue. She was not always sleeping well. She was not always rested. She had not tried melatonin yet. I suggested she try to exercise regularly. For sleep, I suggested she try melatonin. I asked her to drink more water and add more fiber to her diet. [facility].    I saw her on 04/17/2014, at which time she reported more freezing and start hesitation. She had not followed recently. She had more fatigue. She was not drinking enough water. She was exercising regularly she was having bilateral hip pain. I suggested we increase her Sinemet to 4 times a day, at 7, 11, 3 PM and 8 PM.   In the interim, she was seen by Dr. Oneida Alar in sports medicine for left-sided piriformis syndrome. I reviewed his office notes from 05/27/2014 as well as 07/02/2014. Her Cymbalta was increased.   I saw her on 01/14/2014, at which time she reported  doing well after her right total knee replacement in September 2015 and she was in physical therapy through her orthopedics office. She was on Sinemet 3 times a day. She did notice that her posture was worse. Sometimes she had confusion. I did not make medication changes at the time. She presents for a sooner than scheduled appointment secondary to more fatigue and balance issues.    I saw her on 07/30/2013, at which time she felt her PD symptoms were stable. She was taking Sinemet 1 pill 3 times a day. She was on half a pill of  clonazepam, 0.25 mg each night. She was on Cymbalta 30 mg daily per her psychiatrist, Dr. Caprice Beaver. She had finished her study at [facility]. I asked her to start using a cane for safety. I did not make any changes to her medications at the time.    I first met her on 01/30/2013, at which time it felt her exam was stable. She had participated in a study at [facility] which included taking a DaT scan. She had been taking Sinemet 3 times a day but was taking it with her meals. The only thing I suggested at the time of her first visit was to take the Sinemet the way from her mealtimes to ensure better absorption. In the interim, her husband called back stating that she had been experiencing some nausea. Her primary care physician reduced her Sinemet to half a pill 3 times a day. I talked to the patient in April and suggested that she continue with half a pill 3 times a day and try taking a whole pill 3 times a day if she noted an increase in her parkinsonian symptoms.    She previously followed with Dr. Morene Antu and was last seen by him on 12/06/2011, at which time he recommended a DaT scan and a trial of Sinemet to help differentiate her symptoms and help diagnose Parkinson's disease. She was advised that before she could have a DaT scan she would have to come off of Celexa. She did not end up having that test, as she enrolled in a study at [place]. She was supposed to followup after a couple of months, but did not. However, in the interim she started going to [employer] and got into a clinical trial for Parkinson's there. She was placed on Requip, then Sinemet. Requip caused severe sleepiness. She finished the study at [facility] under Dr. Maxine Glenn and as part of the study, she had 2 DaT scans. She did not take any study drugs. She reported severe R knee pain and had injections into her knee. She may need a knee replacement surgery, but is afraid. She goes to [employer] at [employer]. She lives at [place] with her husband in  independent living and still drives. Her husband states she is a good driver. She has fatigue. She has seen Dr. Oneida Alar in sports medicine for her arthritis.     She was initially seen by Dr. Erling Cruz on 05/15/2002 for pain in her hips with evidence of lumbar spinal radiculopathy. She had degenerative disc disease. 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 She did well with conservative therapy. She has a long-standing history of back pain and was treated by Dr. Niel Hummer, who recommended she use amitriptyline. She has a over a 5 year history of deteriorating handwriting. Her balance has been declining as well.  She has to hold onto the rail, going up and down steps. She has fallen infrequently. Her husband and her son started noticing that she shuffles her feet while walking. She started complaining of fatigue and lack of energy. She has remained fairly independent in her ADLs. She has problems with her memory. She denies a family history of tremor. She has long-standing loss of sense of smell. She does not have constipation or acting out dreams. She denies bowel or bladder incontinence. She has been seen by Dr. Tamala Julian for palpitations. CMP,CBC, TSH, T4, and B12 were normal in 2011 and 2012. She had been on doses of vitamin B6 200 to 100 mg per day. Her vitamin B6 level 08/13/2010 was greater than 100. Her B12 was 695. She discontinued B6. EMG/NCV 8./16/12 was normal except for lumbosacral deinnervation, compatible with lumbosacral radiculopathy. MRI brain from 08/26/2010 showed chronic microvascular changes and mild atrophy. She has seen Dr. Casimiro Needle for depression and was changed from citalopram to sertraline 50 mg daily without any benefit and then started seeing Dr. Harley Alto at [facility]. She went off of tramadol because of the possibility of serotonergic syndrome, but restarted it. She has been in  counseling. She denies numbness in her feet and legs. She describes involuntary movements. She has right knee pain. Basic metabolic panel normal 03/02/49. A course of physical therapy for her gait did not help.    Her Past Medical History Is Significant For: Past Medical History:  Diagnosis Date  . Anxiety   . Arthritis    knee & back   . Blood type, Rh negative   . Breast cancer ([place])   . Chronic fatigue   . Depression   . Diverticulosis   . Endocervical polyp   . GERD (gastroesophageal reflux disease)   . Hypertension   . Hypothyroidism   . IBS (irritable bowel syndrome)   . Insomnia   . Lymphedema   . Neuromuscular disorder ([place])    Parkinson's disease   . Osteopenia 06/2015   T score -2.4 distal third of the radius  . Parkinson disease ([place])   . RBBB 10/07/2013  . Thyroid cancer [facility])     Her Past Surgical History Is Significant For: Past Surgical History:  Procedure Laterality Date  . APPENDECTOMY  1979  . BREAST SURGERY  1979   MASTECTOMY AND RECONSTRUCTION  . DILATION AND CURETTAGE OF UTERUS     MISCARRIAGE  . EYE SURGERY Bilateral    w/ IOL  . HYSTEROSCOPY  [id number]   AND D&C  . MASTECTOMY Bilateral   . THYROIDECTOMY    . TONSILLECTOMY    . TOTAL KNEE ARTHROPLASTY Right 10/07/2013   Procedure: RIGHT TOTAL KNEE ARTHROPLASTY;  Surgeon: Kerin Salen, MD;  Location: [place];  Service: Orthopedics;  Laterality: Right;  . TUBAL LIGATION      Her Family History Is Significant For: Family History  Problem Relation Age of Onset  . Hypertension Mother   . Hyperlipidemia Mother   . Heart failure Mother   . Hypertension Father   . Hyperlipidemia Father   . Heart failure Father   . Hyperlipidemia Brother   . Hypertension Brother   . Heart attack Brother     Her Social History Is Significant For: Social History   Social History  . Marital status: Married    Spouse name: N/A  . Number of children: N/A  . Years of education: N/A   Social History Main Topics   . Smoking status:  Never Smoker  . Smokeless tobacco: Never Used  . Alcohol use 0.0 oz/week     Comment: rare  . Drug use: No  . Sexual activity: No     Comment: 1st intercourse 82 yo-Fewer than 5 partners   Other Topics Concern  . None   Social History Narrative  . None    Her Allergies Are:  Allergies  Allergen Reactions  . Sulfa Antibiotics Rash  . Biaxin [Clarithromycin] Other (See Comments)    Pt does not remember reaction   . Ciprofloxacin Nausea And Vomiting  . Hctz [Hydrochlorothiazide] Other (See Comments)    weakness and hyponatremia  . Macrobid [Nitrofurantoin] Nausea Only  . Other Nausea Only  . Prednisone Other (See Comments)    Swelling and facial redness   . Sulfamethoxazole-Trimethoprim Other (See Comments) and Rash    Sore throat  :   Her Current Medications Are:  Outpatient Encounter Prescriptions as of 10/14/2015  Medication Sig  . amLODipine (NORVASC) 2.5 MG tablet Take 2.5 mg by mouth daily.  . Calcium Carb-Cholecalciferol (CALCIUM 600 + D PO) Take 1 tablet by mouth daily with lunch.  . carbidopa-levodopa (SINEMET IR) 25-100 MG tablet Take 2 pills at 6 AM, then 1 at 10 AM, 1 at 2 PM, 1 at 6 PM. (Patient taking differently: Take 1.5 pills at 6 AM, then 1 at 10 AM, 1.5 at 2 PM, 1 at 6 PM.)  . clonazePAM (KLONOPIN) 0.5 MG tablet Take 0.25 mg by mouth at bedtime.   . Cranberry (SM CRANBERRY) 300 MG tablet Take 300 mg by mouth daily.  . DULoxetine (CYMBALTA) 30 MG capsule Take 1 capsule (30 mg total) by mouth 2 (two) times daily.  . irbesartan (AVAPRO) 75 MG tablet Take 75 mg by mouth at bedtime.   Marland Kitchen levothyroxine (SYNTHROID, LEVOTHROID) 112 MCG tablet Take 112 mcg by mouth daily before breakfast.  . Multiple Vitamin (MULTIVITAMIN WITH MINERALS) TABS tablet Take 1 tablet by mouth daily. Women's Over 30  . NONFORMULARY OR COMPOUNDED ITEM Estradiol 0.02 % (1 syringe) into the vagina at bedtime 2 times a week as directed.  . raloxifene (EVISTA) 60 MG  tablet TAKE 1 TABLET (60 MG TOTAL) BY MOUTH DAILY.  . vitamin C (ASCORBIC ACID) 500 MG tablet Take 500 mg by mouth daily.  . [DISCONTINUED] ampicillin (PRINCIPEN) 500 MG capsule Take 1 capsule (500 mg total) by mouth 4 (four) times daily.   No facility-administered encounter medications on file as of 10/14/2015.   :  Review of Systems:  Out of a complete 14 point review of systems, all are reviewed and negative with the exception of these symptoms as listed below: Review of Systems  Neurological:       Pt reports that taking 2 tablets of sinemet at one time was "too much". Pt reports hallucinations but she "knows they are not real." Pt has been participating for "boxing for parkinson's" once weekly. Pt feels tired often and "dazed."    Objective:  Neurologic Exam  Physical Exam Physical Examination:   Vitals:   10/14/15 0920  BP: 120/68  Pulse: 84  Resp: 20   General Examination: The patient is a very pleasant 82 y.o. female in no acute distress.   HEENT: Normocephalic, atraumatic, pupils are equal, round and reactive to light and accommodation. Extraocular tracking shows minimal saccadic breakdown without nystagmus noted. There is no limitation to her gaze. There is mild decrease in eye blink rate. Hearing is intact. Face is symmetric with minimal  to mild facial masking and normal facial sensation. There is no lip, neck or jaw tremor. Neck is mildly rigid with intact passive ROM. There are no carotid bruits on auscultation. Oropharynx exam reveals moderate mouth dryness. No significant airway crowding is noted. Mallampati is class II. Tongue protrudes centrally and palate elevates symmetrically. There is no drooling. R shoulder is lower than L, same.   Chest: is clear to auscultation without wheezing, rhonchi or crackles noted.  Heart: sounds are regular and normal without murmurs, rubs or gallops noted.   Abdomen: is soft, non-tender and non-distended with normal bowel sounds  appreciated on auscultation.  Extremities: There is no pitting edema in the distal lower extremities bilaterally. Pedal pulses are intact. There are no varicose veins.  Skin: is warm and dry with no trophic changes noted. Age-related changes are noted on the skin. She has a skin lesion on the L wrist, bandaged.   Musculoskeletal: exam reveals no obvious joint deformities, tenderness, joint swelling or erythema.   Neurologically:  Mental status: The patient is awake and alert, paying good attention. She is able to provide the history. Her husband provides more details. She is oriented to: person, place, time/date, situation, day of week, month of year and year. Her memory, attention, language and knowledge are fairly well preserved. There is no aphasia, agnosia, apraxia or anomia. There is a mild degree of bradyphrenia. Speech is mild to moderately hypophonic with no dysarthria noted. Mood is congruent and affect is blunted.    Cranial nerves are as described above under HEENT exam. In addition, shoulder shrug is normal with equal shoulder height noted.  Motor exam: Normal bulk, and strength for age is noted. There are no dyskinesias noted. Tone is very mildly rigid really only in the right upper extremity only with some cogwheeling. There is overall mild bradykinesia. There is no drift or rebound.  There is no tremor.   Romberg is negative.  Reflexes are 1+ in the upper extremities and 1+ in the lower extremities.   Fine motor skills exam: Finger taps are mildly impaired on the right and minimally impaired on the left. Hand movements are mildly impaired on the right and minimally impaired on the left. RAP (rapid alternating patting) is minimally impaired on the right and not impaired on the left. Foot taps are mildly impaired on the right and minimally impaired on the left. Foot agility (in the form of heel stomping) is mildly impaired on the right and mildly impaired on the right and minimally so  on the left.    Cerebellar testing shows no dysmetria or intention tremor on finger to nose testing. There is no truncal or gait ataxia.   Sensory exam is intact.   Gait, station and balance: She stands up from the seated position with mild to moderate difficulty and does need to push herself up with her hands. Posture is moderately stooped, she does not need assistance. She complains of mild low back stiffness. She has also start hesitation, no actual freezing, no festination, tandem walk is not possible, she walks with decreased arm swing on the right. Balance is mildly impaired.      Assessment and Plan:    In summary, Michelle Sims is a very pleasant 82 year old female with a history of parkinsonism, most likely R sided predominant Parkinson's disease, akinetic-rigid type. Her physical exam has been stable for the most part. All in all she has had milder findings and we had increased her Sinemet to  4 times a day, now alternating 1 1/2 pills with one. She felt the increase in Sinemet was helpful, but 2 pills together is too. Overall, she has remained fairly stable over the course of time. She feels worse in the morning, better towards the afternoon and evening. She does not always drink enough water. She has been exercising regularly and participates in several different classes at [facility] and also water aerobics and boxing 2/week. She is advised to continue staying active mentally and physically, she is advised about the memory issues that people can develop with advancing Parkinson symptoms. She is not to the point of dementia. They are advised to monitor her driving. We will also monitor her blood pressure values. She is advised to drink more water. We will increase her Sinemet to 1 1/2 pills 4 times a day, about 4 hourly, away from mealtimes.  She c/o lack of energy, lack of drive. She is advised to stay on Cymbalta.  They had multiple questions today which I answered. We talked about  utilizing Azilect too. She had SEs with Requip in the past. I suggested a 6 month follow-up, sooner if needed. I adjusted her prescription for Sinemet. She is on low-dose clonazepam and I encouraged her to continue with this. Most of my 30 minute visit today was spent in counseling and coordination of care, reviewing test results and reviewing medication and the diagnosis of Parkinson's disease, its prognosis and treatment options.

## 2015-10-14 NOTE — Patient Instructions (Addendum)
I think your Parkinson's disease has remained fairly stable, which is reassuring. Nevertheless, as you know, this disease does progress with time. It can affect your balance, your memory, your mood, your bowel and bladder function, your posture, balance and walking and your activities of daily living. However, there are good supportive treatments and symptomatic treatments available, so most patients have a change to a good quality life and life expectancy is not typically altered. Overall you are doing fairly well but I do want to suggest a few things today:  Remember to drink plenty of fluid at least 6 glasses (8 oz each), eat healthy meals and do not skip any meals. Try to eat protein with a every meal and eat a healthy snack such as fruit or nuts in between meals. Try to keep a regular sleep-wake schedule and try to exercise daily, particularly in the form of walking, 20-30 minutes a day, if you can.   Taking your medication on schedule is key.   Try to stay active physically and mentally. Engage in social activities in your community and with your family and try to keep up with current events by reading the newspaper or watching the news. Try to do word puzzles and you may like to do puzzles and brain games on the computer such as on [email or web address]/.   As far as your medications are concerned, I would like to suggest that you take your current medication with the following additional changes: we will increase your Sinemet to 1 1/2 pills 4 times a day, 4 hours apart, try to take away from meal times, about 30-60 min before or 30-60 min after your meals.   Monitor your driving.   I would like to see you back in 6 months, sooner if we need to. Please call us with any interim questions, concerns, problems, updates or refill requests.  Our phone number is [phone]. We also have an after hours call service for urgent matters and there is a physician on-call for urgent questions, that cannot wait till  the next work day. For any emergencies you know to call 911 or go to the nearest emergency room.   You can email me through my chart and also leave a phone message for Beverlee Nims, my nurse.

## 2015-10-15 ENCOUNTER — Encounter: Payer: Self-pay | Admitting: Gynecology

## 2015-10-15 ENCOUNTER — Other Ambulatory Visit: Payer: Self-pay | Admitting: Gynecology

## 2015-10-15 ENCOUNTER — Ambulatory Visit (INDEPENDENT_AMBULATORY_CARE_PROVIDER_SITE_OTHER): Payer: BLUE CROSS/BLUE SHIELD | Admitting: Gynecology

## 2015-10-15 VITALS — BP 120/74

## 2015-10-15 DIAGNOSIS — N3000 Acute cystitis without hematuria: Secondary | ICD-10-CM | POA: Diagnosis not present

## 2015-10-15 LAB — URINALYSIS W MICROSCOPIC + REFLEX CULTURE
Bilirubin Urine: NEGATIVE
CASTS: NONE SEEN [LPF]
CRYSTALS: NONE SEEN [HPF]
Glucose, UA: NEGATIVE
Ketones, ur: NEGATIVE
NITRITE: NEGATIVE
SPECIFIC GRAVITY, URINE: 1.02 (ref 1.001–1.035)
YEAST: NONE SEEN [HPF]
pH: 7 (ref 5.0–8.0)

## 2015-10-15 MED ORDER — AMOXICILLIN-POT CLAVULANATE 250-125 MG PO TABS: 1.0000 | ORAL_TABLET | Freq: Two times a day (BID) | ORAL | 0 refills | Status: DC

## 2015-10-15 NOTE — Progress Notes (Signed)
    Michelle Sims July 31, 1929 [id number]        82 y.o.  [id number] presents complaining of dysuria and frequency times several days. No urgency, low back pain, fever or chills. No nausea vomiting diarrhea constipation. Recently was treated for UTI with ampicillin. Culture and sensitivity showed Escherichia coli and enterococcus sensitive to ampicillin.  Past medical history,surgical history, problem list, medications, allergies, family history and social history were all reviewed and documented in the EPIC chart.  Directed ROS with pertinent positives and negatives documented in the history of present illness/assessment and plan.  Exam: Vitals:   10/15/15 1008  BP: 120/74   General appearance:  Normal Spine straight without CVA tenderness Abdomen soft nontender without masses guarding rebound    Assessment/Plan:  82 y.o. [id number] with history and urinalysis consistent with UTI. Allergic to sulfa, ciprofloxacin and Macrodantin. Nausea and vomiting is the reaction to ciprofloxacin and Macrodantin. Patient requested not to take these antibiotics. We'll treat with Augmentin 250 mg/125 twice a day 7 days. Follow up if symptoms persist, worsen or recur.    [name] MD, 10:31 AM 10/15/2015

## 2015-10-15 NOTE — Addendum Note (Signed)
Addended by: Nelva Nay on: 10/15/2015 10:37 AM   Modules accepted: Orders

## 2015-10-15 NOTE — Patient Instructions (Signed)
Take the antibiotic twice daily for 7 days 

## 2015-10-17 LAB — URINE CULTURE

## 2015-10-26 ENCOUNTER — Telehealth: Payer: Self-pay | Admitting: *Deleted

## 2015-10-26 MED ORDER — FLUCONAZOLE 150 MG PO TABS: 150.0000 mg | ORAL_TABLET | Freq: Once | ORAL | 0 refills | Status: AC

## 2015-10-26 NOTE — Telephone Encounter (Signed)
Diflucan 150 mg 1 dose. Hold cholesterol medicine if she's taking any the day she takes the Diflucan.

## 2015-10-26 NOTE — Telephone Encounter (Signed)
Pt was prescribed Augmentin 250 mg x 7days, completed Rx,now has vaginal itching. Asked if Rx could be sent? Please advise

## 2015-10-26 NOTE — Telephone Encounter (Signed)
Pt informed with the below note, Rx sent. 

## 2015-11-11 ENCOUNTER — Telehealth: Payer: Self-pay | Admitting: Neurology

## 2015-11-11 NOTE — Telephone Encounter (Signed)
Patient called regarding carbidopa-levodopa (SINEMET IR) 25-100 MG tablet, states she saw Dr. Rexene Alberts a month ago and Dr. Rexene Alberts increased pills, states she just hasn't felt right, confusion, memory has gotten worse. She was out driving the other day and got lost, found her way back, states she's doing a lot of exercise and is doing boxing with Jonette Mate for Parkinson's. Please call to advise.

## 2015-11-11 NOTE — Telephone Encounter (Signed)
She was taking C/L 1 1/2 pills alternating with 1 pill before we increased it to 1 1/2 pills 4 times a day, she can go back to the previous regimen of taking 1 1/2 pills 1st dose, then 1 for 2nd dose, then 1 1/2 pills 3rd dose, then 1 pills for the 4th dose. Please advise patient that she should not drive at this time until feeling better.

## 2015-11-11 NOTE — Telephone Encounter (Signed)
Patient takes 1.5 tabs 4 times a day, what do you recommend?

## 2015-11-12 NOTE — Telephone Encounter (Signed)
I spoke to patient and she is aware of results below. She will go back to that regimen. She is also going to try starting Melatonin at night to help her sleep better. She is going to start at a low dose.

## 2015-11-12 NOTE — Telephone Encounter (Signed)
Pt called in wanting to speak with either the nurse or the physician about her call yesterday. She will be leaving the house around 11am .

## 2015-11-26 ENCOUNTER — Ambulatory Visit: Payer: BLUE CROSS/BLUE SHIELD | Admitting: Neurology

## 2016-01-05 ENCOUNTER — Other Ambulatory Visit: Payer: Self-pay

## 2016-01-05 MED ORDER — NONFORMULARY OR COMPOUNDED ITEM: 3 refills | Status: DC

## 2016-01-06 ENCOUNTER — Other Ambulatory Visit: Payer: Self-pay | Admitting: *Deleted

## 2016-01-06 MED ORDER — DULOXETINE HCL 30 MG PO CPEP: 30.0000 mg | ORAL_CAPSULE | Freq: Two times a day (BID) | ORAL | 5 refills | Status: DC

## 2016-01-21 ENCOUNTER — Ambulatory Visit (INDEPENDENT_AMBULATORY_CARE_PROVIDER_SITE_OTHER): Payer: BLUE CROSS/BLUE SHIELD | Admitting: Gynecology

## 2016-01-21 ENCOUNTER — Other Ambulatory Visit: Payer: Self-pay | Admitting: Gynecology

## 2016-01-21 VITALS — BP 108/74

## 2016-01-21 DIAGNOSIS — R300 Dysuria: Secondary | ICD-10-CM | POA: Diagnosis not present

## 2016-01-21 LAB — URINALYSIS W MICROSCOPIC + REFLEX CULTURE
BACTERIA UA: NONE SEEN [HPF]
Bilirubin Urine: NEGATIVE
Casts: NONE SEEN [LPF]
Crystals: NONE SEEN [HPF]
GLUCOSE, UA: NEGATIVE
NITRITE: NEGATIVE
PH: 6 (ref 5.0–8.0)
Specific Gravity, Urine: 1.02 (ref 1.001–1.035)
Yeast: NONE SEEN [HPF]

## 2016-01-21 NOTE — Patient Instructions (Signed)
Office will call you with urine culture results.

## 2016-01-21 NOTE — Progress Notes (Signed)
    DON RUSCHAK 1929-03-06 [id number]        82 y.o.  [id number] presents with mild dysuria and some suprapubic soreness this morning when she awoke. Now it is better. No frequency or urgency. No low back pain fever or chills. No nausea vomiting diarrhea constipation. Recent treatment for UTI in September which grew out Escherichia coli. Has sensitivity to a number of medications although not true allergic mostly GI and she was treated with Augmentin 7 days. It showed an intermediate sensitivity to this but her symptoms cleared.  Past medical history,surgical history, problem list, medications, allergies, family history and social history were all reviewed and documented in the EPIC chart.  Directed ROS with pertinent positives and negatives documented in the history of present illness/assessment and plan.  Exam: Vitals:   01/21/16 1519  BP: 108/74   General appearance:  Normal Spine straight without CVA tenderness Abdomen soft nontender without masses guarding rebound.   Assessment/Plan:  82 y.o. [id number] with transient UTI symptoms this morning but now cleared. Urinalysis does show 10-20 of UBC with 1+ leukocyte esterase. This was looked at on an unspun urine given the small amount of specimen provided. Given that her symptoms have cleared will hold on treatment but follow up on culture and then treat accordingly. Patient's comfortable with this plan. Will call in the interim if she starts to redevelop UTI symptoms.    [name] MD, 3:46 PM 01/21/2016

## 2016-01-24 LAB — URINE CULTURE

## 2016-01-26 ENCOUNTER — Other Ambulatory Visit: Payer: Self-pay | Admitting: *Deleted

## 2016-01-26 MED ORDER — AMOXICILLIN-POT CLAVULANATE 500-125 MG PO TABS: ORAL_TABLET | ORAL | 0 refills | Status: DC

## 2016-02-22 ENCOUNTER — Ambulatory Visit (INDEPENDENT_AMBULATORY_CARE_PROVIDER_SITE_OTHER): Payer: BLUE CROSS/BLUE SHIELD | Admitting: Neurology

## 2016-02-22 ENCOUNTER — Encounter: Payer: Self-pay | Admitting: Neurology

## 2016-02-22 VITALS — BP 118/72 | HR 78 | Resp 14 | Ht 62.0 in | Wt 135.0 lb

## 2016-02-22 DIAGNOSIS — R5383 Other fatigue: Secondary | ICD-10-CM

## 2016-02-22 DIAGNOSIS — G20 Parkinson's disease: Secondary | ICD-10-CM | POA: Diagnosis not present

## 2016-02-22 MED ORDER — CARBIDOPA-LEVODOPA ER 25-100 MG PO TBCR: 1.0000 | EXTENDED_RELEASE_TABLET | Freq: Every day | ORAL | 5 refills | Status: DC

## 2016-02-22 NOTE — Progress Notes (Signed)
Subjective:    Sims ID: Michelle Sims is a 83 y.o. female.  HPI     Interim history:   Michelle Sims is a very pleasant 83 year old right-handed woman with an underlying medical history of chronic back pain, hypertension, thyroid cancer, breast cancer, status post thyroidectomy, tonsillectomy, appendectomy, tubal ligation and mastectomy with reconstruction, who presents for followup consultation of Michelle Sims parkinsonism, probable right-sided predominant Parkinson's disease of Michelle akinetic-rigid type. She is accompanied by Michelle Sims Sims again today. I last saw Michelle Sims on 10/14/2015, at which time she reported doing fairly well but increasing Michelle Sinemet to 2 pills for Michelle first dose felt too much and she reduced Michelle Sims back to within a half pills. She was participating in boxing for Parkinson's disease. She has more complaints about confusion, thankfully no falls, she was alternating 1-1/2 pills of Sinemet with one pill for a total of 4 doses. I asked Michelle Sims to try to increase all 4 doses to 1-1/2 pills. However, she called back on 11/11/2015 reporting that she did not feel right with this dose and she was advised to reduce it back to 1-1/2 pills alternating with one pill rather than 1-1/2 pills 4 times a day.  Today, 02/22/2016: She is still complaining about Michelle Sims fatigue. This has been an ongoing issue for years. She has not been on Sinemet CR. She could not tolerate Michelle 1-1/2 pills of Sinemet IR 4 times a day and complained about confusion and more fatigue and sleepiness. Michelle Sims Sims reports that Michelle Sims sleepiness became worse. She tries to stay active. She goes to boxing classes twice a week and water aerobics 3 times a week. She reports more memory issues, some confusion, issues driving.   Michelle Sims's allergies, current medications, family history, past medical history, past social history, past surgical history and problem list were reviewed and updated as appropriate.   Previously (copied from all previous  notes for reference):  I saw Michelle Sims on 05/25/2015, at which time she reported not feeling too well overall, reported more fatigue, more slowness, more cognitive issues, felt less confident with walking. She reported no recent falls, she had no choking episodes. She was not drinking enough fluid she admitted. She had some hip pain bilaterally as well. I suggested a cautious increase in Michelle Sims Sinemet to 2 pills for Michelle first dose then 1 for Michelle next 3 doses for Michelle day. We talked about coenzyme Q10 supplementation. She was encouraged to drink more water and exercise regularly.     I saw Michelle Sims on 11/24/2014, at which time she reported that she was trying to continue with water aerobics and Michelle Sims walking exercises. She did not like Michelle spinning class at [facility]. She had some visual illusions but no frank hallucinations. She has not fallen but was fearful of falling. She had some dream enactments. These were few and far between. She was on a small dose of clonazepam in Michelle past and took 0.5 mg for years. She felt that Michelle Sims memory was worse mood was stable. Overall, she felt that she had not progressed very much but she did for about Michelle future. I suggested she continue with Sinemet 1 pill 4 times a day.   I saw Michelle Sims on 07/22/2014, at which time she reported a recent fall about a week prior at Michelle hairdresser's. She bruised Michelle Sims knees. She was not using a cane at Michelle time. Michelle Sims Sims noted that she would probably do better using a cane. She had blood work with Michelle Sims  primary care physician which per Sims was mostly fine. She was not drinking enough water. She was complaining of fatigue. She was not always sleeping well. She was not always rested. She had not tried melatonin yet. I suggested she try to exercise regularly. For sleep, I suggested she try melatonin. I asked Michelle Sims to drink more water and add more fiber to Michelle Sims diet. She wanted to sign up for Michelle spinning class at [facility]. I signed Michelle form for it.    I saw Michelle Sims on  04/17/2014, at which time she reported more freezing and start hesitation. She had not followed recently. She had more fatigue. She was not drinking enough water. She was exercising regularly she was having bilateral hip pain. I suggested we increase Michelle Sims Sinemet to 4 times a day, at 7, 11, 3 PM and 8 PM.   In Michelle interim, she was seen by Dr. Oneida Alar in sports medicine for left-sided piriformis syndrome. I reviewed his office notes from 05/27/2014 as well as 07/02/2014. Michelle Sims Cymbalta was increased.   I saw Michelle Sims on 01/14/2014, at which time she reported doing well after Michelle Sims right total knee replacement in September 2015 and she was in physical therapy through Michelle Sims orthopedics office. She was on Sinemet 3 times a day. She did notice that Michelle Sims posture was worse. Sometimes she had confusion. I did not make medication changes at Michelle time. She presents for a sooner than scheduled appointment secondary to more fatigue and balance issues.    I saw Michelle Sims on 07/30/2013, at which time she felt Michelle Sims PD symptoms were stable. She was taking Sinemet 1 pill 3 times a day. She was on half a pill of clonazepam, 0.25 mg each night. She was on Cymbalta 30 mg daily per Michelle Sims psychiatrist, Dr. Caprice Beaver. She had finished Michelle Sims study at [facility]. I asked Michelle Sims to start using a cane for safety. I did not make any changes to Michelle Sims medications at Michelle time.    I first met Michelle Sims on 01/30/2013, at which time it felt Michelle Sims exam was stable. She had participated in a study at [facility] which included taking a DaT scan. She had been taking Sinemet 3 times a day but was taking it with Michelle Sims meals. Michelle only thing I suggested at Michelle time of Michelle Sims first visit was to take Michelle Sinemet away from Michelle Sims mealtimes to ensure better absorption. In Michelle interim, Michelle Sims Sims called back stating that she had been experiencing some nausea. Michelle Sims primary care physician reduced Michelle Sims Sinemet to half a pill 3 times a day. I talked to Michelle Sims in April and suggested that she continue with half a  pill 3 times a day and try taking a whole pill 3 times a day if she noted an increase in Michelle Sims parkinsonian symptoms.    She previously followed with Dr. Morene Antu and was last seen by him on 12/06/2011, at which time he recommended a DaT scan and a trial of Sinemet to help differentiate Michelle Sims symptoms and help diagnose Parkinson's disease. She was advised that before she could have a DaT scan she would have to come off of Celexa. She did not end up having that test, as she enrolled in a study at [place]. She was supposed to followup after a couple of months, but did not. However, in Michelle interim she started going to [employer] and got into a clinical trial for Parkinson's there. She was placed on Requip, then Sinemet. Requip caused severe sleepiness. She  finished Michelle study at [facility] under Dr. Maxine Glenn and as part of Michelle study, she had 2 DaT scans. She did not take any study drugs. She reported severe R knee pain and had injections into Michelle Sims knee. She may need a knee replacement surgery, but is afraid. She goes to [employer] at [employer]. She lives at [place] with Michelle Sims Sims in independent living and still drives. Michelle Sims Sims states she is a good driver. She has fatigue. She has seen Dr. Oneida Alar in sports medicine for Michelle Sims arthritis.     She was initially seen by Dr. Erling Cruz on 05/15/2002 for pain in Michelle Sims hips with evidence of lumbar spinal radiculopathy. She had degenerative disc disease. MRI L-spine from 05/09/02 showed evidence of an HNP at L3-4 centrally and to Michelle left extending into Michelle foramen and compressing Michelle left L3 nerve root. She did well with conservative therapy. She has a long-standing history of back pain and was treated by Dr. Niel Hummer, who recommended she use amitriptyline. She has a over a 5 year history of deteriorating handwriting. Michelle Sims balance has been declining as well. She has to hold onto Michelle rail, going up and down steps. She has fallen infrequently. Michelle Sims Sims and Michelle Sims son started noticing that  she shuffles Michelle Sims feet while walking. She started complaining of fatigue and lack of energy. She has remained fairly independent in Michelle Sims ADLs. She has problems with Michelle Sims memory. She denies a family history of tremor. She has long-standing loss of sense of smell. She does not have constipation or acting out dreams. She denies bowel or bladder incontinence. She has been seen by Dr. Tamala Julian for palpitations. CMP,CBC, TSH, T4, and B12 were normal in 2011 and 2012. She had been on doses of vitamin B6 200 to 100 mg per day. Michelle Sims vitamin B6 level 08/13/2010 was greater than 100. Michelle Sims B12 was 695. She discontinued B6. EMG/NCV 8./16/12 was normal except for lumbosacral deinnervation, compatible with lumbosacral radiculopathy. MRI brain from 08/26/2010 showed chronic microvascular changes and mild atrophy. She has seen Dr. Casimiro Needle for depression and was changed from citalopram to sertraline 50 mg daily without any benefit and then started seeing Dr. Harley Alto at [facility]. She went off of tramadol because of Michelle possibility of serotonergic syndrome, but restarted it. She has been in counseling. She denies numbness in Michelle Sims feet and legs. She describes involuntary movements. She has right knee pain. Basic metabolic panel normal 06/02/91. A course of physical therapy for Michelle Sims gait did not help.    Michelle Sims Past Medical History Is Significant For: Past Medical History:  Diagnosis Date  . Anxiety   . Arthritis    knee & back   . Blood type, Rh negative   . Breast cancer ([place])   . Chronic fatigue   . Depression   . Diverticulosis   . Endocervical polyp   . GERD (gastroesophageal reflux disease)   . Hypertension   . Hypothyroidism   . IBS (irritable bowel syndrome)   . Insomnia   . Lymphedema   . Neuromuscular disorder ([place])    Parkinson's disease   . Osteopenia 06/2015   T score -2.4 distal third of Michelle radius  . Parkinson disease ([place])   . RBBB 10/07/2013  . Thyroid cancer [facility])     Michelle Sims Past Surgical History Is  Significant For: Past Surgical History:  Procedure Laterality Date  . APPENDECTOMY  1979  . BREAST SURGERY  1979   MASTECTOMY AND RECONSTRUCTION  . DILATION AND CURETTAGE OF  UTERUS     MISCARRIAGE  . EYE SURGERY Bilateral    w/ IOL  . HYSTEROSCOPY  [id number]   AND D&C  . MASTECTOMY Bilateral   . THYROIDECTOMY    . TONSILLECTOMY    . TOTAL KNEE ARTHROPLASTY Right 10/07/2013   Procedure: RIGHT TOTAL KNEE ARTHROPLASTY;  Surgeon: Kerin Salen, MD;  Location: [place];  Service: Orthopedics;  Laterality: Right;  . TUBAL LIGATION      Michelle Sims Family History Is Significant For: Family History  Problem Relation Age of Onset  . Hypertension Mother   . Hyperlipidemia Mother   . Heart failure Mother   . Hypertension Father   . Hyperlipidemia Father   . Heart failure Father   . Hyperlipidemia Brother   . Hypertension Brother   . Heart attack Brother     Michelle Sims Social History Is Significant For: Social History   Social History  . Marital status: Married    Spouse name: N/A  . Number of children: N/A  . Years of education: N/A   Social History Main Topics  . Smoking status: Never Smoker  . Smokeless tobacco: Never Used  . Alcohol use 0.0 oz/week     Comment: rare  . Drug use: No  . Sexual activity: No     Comment: 1st intercourse 83 yo-Fewer than 5 partners   Other Topics Concern  . None   Social History Narrative  . None    Michelle Sims Allergies Are:  Allergies  Allergen Reactions  . Sulfa Antibiotics Rash  . Biaxin [Clarithromycin] Other (See Comments)    Pt does not remember reaction   . Ciprofloxacin Nausea And Vomiting  . Hctz [Hydrochlorothiazide] Other (See Comments)    weakness and hyponatremia  . Macrobid [Nitrofurantoin] Nausea Only  . Other Nausea Only  . Prednisone Other (See Comments)    Swelling and facial redness   . Sulfamethoxazole-Trimethoprim Other (See Comments) and Rash    Sore throat  :   Michelle Sims Current Medications Are:  Outpatient Encounter  Prescriptions as of 02/22/2016  Medication Sig  . amLODipine (NORVASC) 2.5 MG tablet Take 2.5 mg by mouth daily.  . Calcium Carb-Cholecalciferol (CALCIUM 600 + D PO) Take 1 tablet by mouth daily with lunch.  . carbidopa-levodopa (SINEMET IR) 25-100 MG tablet Take 1 1/2 pills 4 time a day, about 4 hours apart. (Sims taking differently: Alternate 1.5 tab with 1 tab four times a day.)  . clonazePAM (KLONOPIN) 0.5 MG tablet Take 0.25 mg by mouth at bedtime.   . Cranberry (SM CRANBERRY) 300 MG tablet Take 300 mg by mouth daily.  . DULoxetine (CYMBALTA) 30 MG capsule Take 1 capsule (30 mg total) by mouth 2 (two) times daily.  . irbesartan (AVAPRO) 75 MG tablet Take 75 mg by mouth at bedtime.   Marland Kitchen levothyroxine (SYNTHROID, LEVOTHROID) 112 MCG tablet Take 112 mcg by mouth daily before breakfast.  . Multiple Vitamin (MULTIVITAMIN WITH MINERALS) TABS tablet Take 1 tablet by mouth daily. Women's Over 49  . NONFORMULARY OR COMPOUNDED ITEM Estradiol 0.02 % (1 syringe) into Michelle vagina at bedtime 2 times a week as directed.  . raloxifene (EVISTA) 60 MG tablet TAKE 1 TABLET (60 MG TOTAL) BY MOUTH DAILY.  . [DISCONTINUED] amoxicillin-clavulanate (AUGMENTIN) 500-125 MG tablet Take one tablet by mouth twice daily for 5 days  . [DISCONTINUED] vitamin C (ASCORBIC ACID) 500 MG tablet Take 500 mg by mouth daily.   No facility-administered encounter medications on file as of 02/22/2016.   :  Review of Systems:  Out of a complete 14 point review of systems, all are reviewed and negative with Michelle exception of these symptoms as listed below:  Review of Systems  Neurological:       Sims states that she is alternating 1.5 tabs of Sinemet with 1 tab, 4 times a day. Still feels tired.       Objective:  Neurologic Exam  Physical Exam Physical Examination:   Vitals:   02/22/16 1505  BP: 118/72  Pulse: 78  Resp: 14   General Examination: Michelle Sims is a very pleasant 83 y.o. female in no acute  distress. She appears well-developed and well-nourished and well groomed.   HEENT: Normocephalic, atraumatic, pupils are equal, round and reactive to light and accommodation, she has mild saccadic breakdown on extraocular testing, she has mild limitation to upper gaze. She has a mildly decreased eye blink rate. Hearing is mildly impaired, face is symmetric with mild facial masking noted, normal facial sensation. She has mild to moderate nuchal rigidity, decreased active range of motion but intact passive range of motion, oropharynx exam reveals mild to moderate mouth dryness, no significant airway crowding, Mallampati is class II, tongue protrudes centrally and palate elevates symmetrically, she has no evidence of drooling,  left shoulder slightly higher than right.   Chest: is  Clear to auscultation without wheezing or crackles noted.  Heart: sounds are  regular without murmurs, rubs or gallops noted.   Abdomen: is soft, non-tender and non-distended with normal bowel sounds appreciated on auscultation.: Changes and updated.   Extremities: There is no pitting edema in Michelle distal lower extremities bilaterally. Pedal pulses are intact. There are no varicose veins. all unchanged and updated   Skin: is warm and dry with no trophic changes noted. Age-related changes are noted on Michelle skin. She  wears an elastic sleeve on Michelle left arm.    Musculoskeletal: exam reveals no obvious joint deformities, tenderness, joint swelling or erythema.  mild arthritic changes in Michelle Sims hands.   Neurologically:  Mental status: Michelle Sims is awake and alert, paying good attention. She is able to provide Michelle history. Michelle Sims Sims provides more details.  she has multiple questions written down which we reviewed. She is oriented to time, place, date, situation, month and year. She has fairly well-preserved attention, memory, language and knowledge. She has no significant problems naming, she has mild bradyphrenia. Speech is mild  to moderately hypophonic with no dysarthria noted. Mood is congruent and affect is normal.     Cranial nerves are as described above under HEENT exam. In addition, shoulder shrug is normal with equal shoulder height noted.  Motor exam: Normal bulk, and strength for age is noted. Strength and tone are same as last time, very mild rigidity noted in Michelle right upper extremity, minimal cogwheeling noted, overall mild to moderate bradykinesia, no dyskinesias noted. She has no drift or rebound, she has no significant tremor, Romberg is not testable safely. Reflexes are 1+ throughout. She has intact sensation in Michelle upper and lower extremities, to light touch.  Fine motor skills are mild to moderately impaired, right more pronounced than left. Cerebellar testing shows no additional signs of dysmetria or intention tremor, no ataxia.   Gait, station and balance:  she stands up with difficulty, has mild to moderate difficulty with standing up, has to push herself up, requires no help, posture is moderately stooped, she walks slowly with mild start hesitation, no freezing noted. No shuffling, she does have mild  decrease in stride length and pace, turns slowly, balance is mildly impaired.  Assessment and Plan:    In summary, Michelle Sims is a very pleasant 83 year old female with an underlying medical history of chronic back pain, hypertension, thyroid cancer, breast cancer, status post thyroidectomy, tonsillectomy, appendectomy, tubal ligation and mastectomy with reconstruction, who presents for follow-up consultation of Michelle Sims parkinsonism, with right-sided lateralization, symptoms dating back to several years ago. She has probable right-sided predominant Parkinson's disease, no tremor predominance noted. Michelle Sims exam has been stable for quite some time, she has overall mild progression with time. She was not able to tolerate Sinemet 1-1/2 pills 4 times a day and decrease it back to 1-1/2 pills alternating with one pill  for 4 doses. She is advised to continue with this regimen and started 8 AM, with for early dosing. She is trying to stay active physically and goes to water aerobics about 3 times per week and boxing classes 2 times per week. She is advised to continue to stay active mentally and physically. She is advised to cut back on Michelle Sims driving as she has had some issues with confusion and momentary disorientation while driving. Furthermore, she is advised to stay well-hydrated. I would like for Michelle Sims to try Sinemet CR at bedtime around 10 PM, 25-100 milligrams strength once daily. I provided a new prescription and written instructions for this. I would like to see Michelle Sims back in 4 months, sooner as needed. I answered all their multiple questions today and Michelle Sims were in agreement.  I spent 40 minutes in total face-to-face time with Michelle Sims, more than 50% of which was spent in counseling and coordination of care, reviewing test results, reviewing medication and discussing or reviewing Michelle diagnosis of PD, its prognosis and treatment options. Pertinent laboratory and imaging test results that were available during this visit with Michelle Sims were reviewed by me and considered in my medical decision making (see chart for details).

## 2016-02-22 NOTE — Patient Instructions (Addendum)
We will continue with your Sinemet 1 1/2 at 8 AM, 1 at 12, 1 1/2 at 4 PM, and 1 at 8 PM.   We will add a long-acting Sinemet CR at bedtime, around 10 PM.

## 2016-03-05 ENCOUNTER — Telehealth: Payer: Self-pay | Admitting: Gynecology

## 2016-03-05 NOTE — Telephone Encounter (Signed)
On Call Note:  Awoke with mild dysuria.  No frequency, urgency, low back pain, fever, chills.  History of UTI's.  Dysuria comes and goes, not consistent. Options of antibiotics now vs push fluids, monitor and call if symptoms worsen, OV Monday if symptoms linger discussed,  Patient prefers to monitor for now, will call back if symptoms continue/worsen, OV Monday if lingers.

## 2016-03-07 ENCOUNTER — Other Ambulatory Visit: Payer: Self-pay | Admitting: Gynecology

## 2016-03-07 ENCOUNTER — Encounter: Payer: Self-pay | Admitting: Gynecology

## 2016-03-07 ENCOUNTER — Ambulatory Visit (INDEPENDENT_AMBULATORY_CARE_PROVIDER_SITE_OTHER): Payer: BLUE CROSS/BLUE SHIELD | Admitting: Gynecology

## 2016-03-07 VITALS — BP 116/74

## 2016-03-07 DIAGNOSIS — R300 Dysuria: Secondary | ICD-10-CM | POA: Diagnosis not present

## 2016-03-07 LAB — URINALYSIS W MICROSCOPIC + REFLEX CULTURE
Bilirubin Urine: NEGATIVE
Casts: NONE SEEN [LPF]
Crystals: NONE SEEN [HPF]
Glucose, UA: NEGATIVE
Ketones, ur: NEGATIVE
NITRITE: NEGATIVE
PH: 7 (ref 5.0–8.0)
Specific Gravity, Urine: 1.015 (ref 1.001–1.035)
YEAST: NONE SEEN [HPF]

## 2016-03-07 MED ORDER — AMOXICILLIN-POT CLAVULANATE 500-125 MG PO TABS: 1.0000 | ORAL_TABLET | Freq: Two times a day (BID) | ORAL | 0 refills | Status: DC

## 2016-03-07 NOTE — Progress Notes (Signed)
    Michelle Sims 03/27/1929 [id number]        83 y.o.  [id number] presents with intermittent dysuria starting several days ago. No fever or chills. No low back pain urgency or frequency. Pushed fluids over the weekend which seemed to resolve her symptoms. This morning she woke with more consistent dysuria, suprapubic discomfort and urgency. No low back pain fever or chills. No nausea vomiting diarrhea constipation. Has been treated over the past several months for recurrent UTIs involving enterococcus and Escherichia coli. The most recent treatments were in August September and December. The symptoms she was having completely resolved after each treatment. She has seen Dr. Gaynelle Sims in the past for recurrent UTIs.  Past medical history,surgical history, problem list, medications, allergies, family history and social history were all reviewed and documented in the EPIC chart.  Directed ROS with pertinent positives and negatives documented in the history of present illness/assessment and plan.  Exam: Michelle Sims assistant Vitals:   03/07/16 1143  BP: 116/74   General appearance:  Normal Spine straight without CVA tenderness Abdomen soft nontender without masses guarding rebound Pelvic external BUS vagina with atrophic changes. Bimanual without masses. Some tenderness palpating the bladder.  Assessment/Plan:  83 y.o. [id number] with symptoms to suggest UTI. Urinalysis shows packed WBCs, 3-10 RBCs, tender 20 squamous cells and many bacteria. Had been treated with Augmentin in the past. Has drug sensitivities to sulfa Macrodantin mostly GI nausea. Will retreat with Augmentin 500 mg   twice a day 7 days. Will arrange appointment with Dr. Gaynelle Sims for longer-term management of her recurrent UTIs.   Michelle Auerbach MD, 12:07 PM 03/07/2016

## 2016-03-07 NOTE — Patient Instructions (Signed)
Take the antibiotic twice daily for 7 days.  The urology office will call you to arrange an appointment to see Dr. Gaynelle Arabian

## 2016-03-07 NOTE — Addendum Note (Signed)
Addended by: Nelva Nay on: 03/07/2016 12:47 PM   Modules accepted: Orders

## 2016-03-08 ENCOUNTER — Telehealth: Payer: Self-pay | Admitting: *Deleted

## 2016-03-08 NOTE — Telephone Encounter (Signed)
-----   Message from [name], MD sent at 03/07/2016 12:10 PM EST ----- Appointment with Dr. Gaynelle Arabian at [facility] urology reference patient known to him with recurrent UTIs.

## 2016-03-08 NOTE — Telephone Encounter (Signed)
Notes faxed they will fax me back with time and date to relay to patient

## 2016-03-09 LAB — URINE CULTURE

## 2016-03-14 NOTE — Telephone Encounter (Signed)
Pt scheduled on 04/12/16 @ 3:15pm with Dr.Tannenbaum pt aware.

## 2016-04-12 ENCOUNTER — Ambulatory Visit: Payer: BLUE CROSS/BLUE SHIELD | Admitting: Neurology

## 2016-05-06 DIAGNOSIS — N3281 Overactive bladder: Secondary | ICD-10-CM | POA: Insufficient documentation

## 2016-05-06 DIAGNOSIS — N390 Urinary tract infection, site not specified: Secondary | ICD-10-CM | POA: Insufficient documentation

## 2016-05-13 ENCOUNTER — Encounter: Payer: Self-pay | Admitting: Neurology

## 2016-05-31 NOTE — Progress Notes (Signed)
Michelle Sims was seen today in the movement disorders clinic for neurologic consultation at the request of Josetta Huddle, MD.  The consultation is for the evaluation of PD.  The records that were made available to me were reviewed.  This patient is accompanied in the office by her spouse who supplements the history.  Pt has previously seen Dr. Erling Cruz, Dr. Maxine Glenn and Dr. Rexene Alberts.  She last saw Dr. Rexene Alberts on 02/22/16.  She was first seen by Dr. love.  I do not have his records.  She became part of a research trial for DaT scan at [facility] at the end of 2013.  She did have a DaT scan on 12/27/2012 that demonstrated absent uptake in the bilateral putamen.  Also had one in 5/20104 with similar findings (reduced uptake).  She was placed on ropinirole in December, 2013 but did not tolerate the medication.  In early, 2014 she was placed on levodopa.  Dr. Guadelupe Sabin notes indicate that she has tried to work her up over time to carbidopa/levodopa 25/100, 1-1/2 tablets 4 times per day, but the patient really could not tolerate this so last visit she told her to take carbidopa/levodopa 25/100 on the following schedule: 1.5 at 6:30-7am/1at 11am/1.5at 3pm/1 at 7pm and then she added carbidopa/levodopa 25/100 CR at bedtime (10pm).  Pt states she isn't sure why bedtime dose was added.  She doesn't note wearing off of med.  She isn't sure that med helps at all or what it helps with.  Records indicate patient has been active with [employer] programs for PD and she is still involved with these programs.   She was involved with water aerobics but had to be out of the water for a squamous cell CA removal.     Specific Symptoms:  Tremor: No. Family hx of similar:  No. Voice: some hoarseness Sleep: some trouble both getting to and staying asleep  Vivid Dreams:  No.  Acting out dreams:  Yes.   (rarely will scream out) Wet Pillows: No. Postural symptoms:  Yes.    Falls?  No. Bradykinesia symptoms: slow movements and  difficulty getting out of a chair Loss of smell:  Yes.   Loss of taste:  No. Urinary Incontinence:  No. (wears panty liner just in case) Difficulty Swallowing:  No. Handwriting, micrographia: unknown Trouble with ADL's:  No.  Trouble buttoning clothing: No. Depression:  Yes.   (just gave up driving and that was stressful) Memory changes:  Yes.   Hallucinations:  Yes.  , rare and lasts few seconds and knows not real (sees person)  visual distortions: Yes.   N/V:  No. Lightheaded:  No.  Syncope: No. Diplopia:  No. Dyskinesia:  No.  Neuroimaging has not previously been performed.    PREVIOUS MEDICATIONS: Sinemet and Requip  ALLERGIES:   Allergies  Allergen Reactions  . Sulfa Antibiotics Rash  . Biaxin [Clarithromycin] Other (See Comments)    Pt does not remember reaction   . Ciprofloxacin Nausea And Vomiting  . Hctz [Hydrochlorothiazide] Other (See Comments)    weakness and hyponatremia  . Macrobid [Nitrofurantoin] Nausea Only  . Other Nausea Only  . Prednisone Other (See Comments)    Swelling and facial redness   . Sulfamethoxazole-Trimethoprim Other (See Comments) and Rash    Sore throat    CURRENT MEDICATIONS:  Outpatient Encounter Prescriptions as of 06/02/2016  Medication Sig  . Ascorbic Acid (VITAMIN C PO) Take by mouth.  . Calcium Carb-Cholecalciferol (CALCIUM 600 +  D PO) Take 1 tablet by mouth daily with lunch.  . carbidopa-levodopa (SINEMET IR) 25-100 MG tablet Take 1 1/2 pills 4 time a day, about 4 hours apart. (Patient taking differently: 1.5 am, 1 at 11:00 am, 1.5 at 3:00 pm, 1 at 8:00 pm)  . Carbidopa-Levodopa ER (SINEMET CR) 25-100 MG tablet controlled release Take 1 tablet by mouth at bedtime. (Patient taking differently: Take 1 tablet by mouth at bedtime. At 10 pm)  . cholecalciferol (VITAMIN D) 1000 units tablet Take 2,000 Units by mouth daily.  . clonazePAM (KLONOPIN) 0.5 MG tablet Take by mouth at bedtime. Quarter tablet  . co-enzyme Q-10 30 MG capsule  Take 30 mg by mouth 3 (three) times daily.  . Cranberry (SM CRANBERRY) 300 MG tablet Take 300 mg by mouth daily.  . DULoxetine (CYMBALTA) 30 MG capsule Take 1 capsule (30 mg total) by mouth 2 (two) times daily.  . irbesartan (AVAPRO) 75 MG tablet Take 37.5 mg by mouth at bedtime.   Marland Kitchen levothyroxine (SYNTHROID, LEVOTHROID) 112 MCG tablet Take 112 mcg by mouth daily before breakfast.  . NONFORMULARY OR COMPOUNDED ITEM Estradiol 0.02 % (1 syringe) into the vagina at bedtime 2 times a week as directed.  . raloxifene (EVISTA) 60 MG tablet TAKE 1 TABLET (60 MG TOTAL) BY MOUTH DAILY.  Marland Kitchen amoxicillin-clavulanate (AUGMENTIN) 500-125 MG tablet Take 1 tablet (500 mg total) by mouth 2 (two) times daily. (Patient not taking: Reported on 06/02/2016)  . [DISCONTINUED] amLODipine (NORVASC) 2.5 MG tablet Take 2.5 mg by mouth daily.  . [DISCONTINUED] Multiple Vitamin (MULTIVITAMIN WITH MINERALS) TABS tablet Take 1 tablet by mouth daily. Women's Over Fifty   No facility-administered encounter medications on file as of 06/02/2016.     PAST MEDICAL HISTORY:   Past Medical History:  Diagnosis Date  . Anxiety   . Arthritis    knee & back   . Blood type, Rh negative   . Breast cancer ([place])   . Chronic fatigue   . Depression   . Diverticulosis   . Endocervical polyp   . GERD (gastroesophageal reflux disease)   . Hypertension   . Hypothyroidism   . IBS (irritable bowel syndrome)   . Insomnia   . Lymphedema   . Neuromuscular disorder ([place])    Parkinson's disease   . Osteopenia 06/2015   T score -2.4 distal third of the radius  . Parkinson disease ([place])   . RBBB 10/07/2013  . Thyroid cancer ([place])     PAST SURGICAL HISTORY:   Past Surgical History:  Procedure Laterality Date  . APPENDECTOMY  1979  . BREAST SURGERY  1979   MASTECTOMY AND RECONSTRUCTION  . DILATION AND CURETTAGE OF UTERUS     MISCARRIAGE  . EYE SURGERY Bilateral    w/ IOL  . HYSTEROSCOPY  [id number]   AND D&C  . MASTECTOMY Bilateral    . THYROIDECTOMY    . TONSILLECTOMY    . TOTAL KNEE ARTHROPLASTY Right 10/07/2013   Procedure: RIGHT TOTAL KNEE ARTHROPLASTY;  Surgeon: Kerin Salen, MD;  Location: [facility];  Service: Orthopedics;  Laterality: Right;  . TUBAL LIGATION      SOCIAL HISTORY:   Social History   Social History  . Marital status: Married    Spouse name: N/A  . Number of children: N/A  . Years of education: N/A   Occupational History  . Not on file.   Social History Main Topics  . Smoking status: Never Smoker  . Smokeless tobacco:  Never Used  . Alcohol use No     Comment: rare  . Drug use: No  . Sexual activity: No     Comment: 1st intercourse 83 yo-Fewer than 5 partners   Other Topics Concern  . Not on file   Social History Narrative  . No narrative on file    FAMILY HISTORY:   Family Status  Relation Status  . Son Alive  . Son Alive  . Son Alive  . Mother Deceased at age 74  . Father Deceased at age 38  . Brother (Not Specified)    ROS:  Some DOE.  A complete 10 system review of systems was obtained and was unremarkable apart from what is mentioned above.  PHYSICAL EXAMINATION:    VITALS:   Vitals:   06/02/16 0922  BP: 128/70  Pulse: 87  SpO2: 95%  Weight: 133 lb (60.3 kg)  Height: 5\' 2"  (1.575 m)    GEN:  The patient appears stated age and is in NAD. HEENT:  Normocephalic, atraumatic.  The mucous membranes are moist. The superficial temporal arteries are without ropiness or tenderness. CV:  RRR Lungs:  CTAB Neck/HEME:  There are no carotid bruits bilaterally.  Neurological examination:  Orientation:  Montreal Cognitive Assessment  06/02/2016  Visuospatial/ Executive (0/5) 1  Naming (0/3) 3  Attention: Read list of digits (0/2) 2  Attention: Read list of letters (0/1) 1  Attention: Serial 7 subtraction starting at 100 (0/3) 3  Language: Repeat phrase (0/2) 2  Language : Fluency (0/1) 1  Abstraction (0/2) 1  Delayed Recall (0/5) 2  Orientation (0/6) 5  Total 21    Adjusted Score (based on education) 21   Cranial nerves: There is good facial symmetry. Pupils are equal round and reactive to light bilaterally. Fundoscopic exam reveals clear margins bilaterally. Extraocular muscles are intact. The visual fields are full to confrontational testing. The speech is fluent and clear. Soft palate rises symmetrically and there is no tongue deviation. Hearing is intact to conversational tone. Sensation: Sensation is intact to light and pinprick throughout (facial, trunk, extremities). Vibration is intact at the bilateral big toe. There is no extinction with double simultaneous stimulation. There is no sensory dermatomal level identified. Motor: Strength is 5/5 in the bilateral upper and lower extremities.   Shoulder shrug is equal and symmetric.  There is no pronator drift. Deep tendon reflexes: Deep tendon reflexes are 2+/4 at the bilateral biceps, triceps, brachioradialis, left patella, 1/4 at the right patella and bilateral achilles. Plantar responses are downgoing bilaterally.  Movement examination: Tone: There is normal tone in the bilateral upper extremities.  The tone in the lower extremities is normal.  Abnormal movements: none even with distraction Coordination:  There is no decremation with RAM's, with any form of RAMS, including alternating supination and pronation of the forearm, hand opening and closing, finger taps, heel taps and toe taps but she is slow to perform the tasks Gait and Station: The patient has difficulty arising out of a deep-seated chair without the use of the hands and makes multiple unsuccessful attempts before she pushes off of the chair and gets up. The patient's stride length is decreased and wide based.  She is mildly unsteady    ASSESSMENT/PLAN:  1.  Parkinson's disease by hx  -didn't look too parkinsonian today but was seen and examined on medication  - continue carbidopa/levodopa 25/100 on the following schedule: 1.5 at  6:30-7am/1at 11am/1.5at 3pm/1 at 7pm and then she added carbidopa/levodopa  25/100 CR at bedtime (10pm).   -She did have a DaT scan on 12/27/2012 that demonstrated absent uptake in the bilateral putamen.  Also had one in 5/20104 with similar findings (reduced uptake).  -will schedule for levodopa challenge to determine efficacy of medication  -We discussed that it used to be thought that levodopa would increase risk of melanoma but now it is believed that Parkinsons itself likely increases risk of melanoma. she is to get regular skin checks.  She just saw a dermatologist.  -Greater than 50% of the 60 minute visit was spent in counseling answering questions and talking about what to expect now as well as in the future.  We talked about medication options as well as potential future surgical options.  We talked about safety in the home.  -I will [facility] the patient [facility]  -We discussed community resources in the area including patient support groups and community exercise programs for PD and pt education was provided to the patient.  She is already attending rock steady boxing.  -refused meeting social worker today as she had somewhere to be.    Will do that next visit  -husband asked about CBD oil and discussed no placebo controlled trials and [facility] does not recommend.  Discussed the small trials on marijuana and PD.  Overall, not something I recommend and oils were less potent.  2.  Memory Loss  -I do think that the patient has Parkinsons disease dementia.  This is a common part of Parkinson's disease and we discussed this today.  We talked about the nature, etiology and pathophysiology as well as the degenerative nature.  She has already stopped driving and she asked about resuming.  I told her that I don't recommend this.  Pt was absolutely overwhelmed by this visit and we decided to resume this discussion next visit.  Did preliminarily discuss  neuropsychology referral/eval.    3.  Follow up is anticipated in the next few months, sooner should new neurologic issues arise.  Time spent in office today did not include the 35 minutes spent in record review.  This was non-face-to-face time. Cc:  Josetta Huddle, MD

## 2016-06-02 ENCOUNTER — Encounter: Payer: Self-pay | Admitting: Neurology

## 2016-06-02 ENCOUNTER — Ambulatory Visit (INDEPENDENT_AMBULATORY_CARE_PROVIDER_SITE_OTHER): Payer: BLUE CROSS/BLUE SHIELD | Admitting: Neurology

## 2016-06-02 VITALS — BP 128/70 | HR 87 | Ht 62.0 in | Wt 133.0 lb

## 2016-06-02 DIAGNOSIS — F0280 Dementia in other diseases classified elsewhere without behavioral disturbance: Secondary | ICD-10-CM

## 2016-06-02 DIAGNOSIS — G20 Parkinson's disease: Secondary | ICD-10-CM

## 2016-06-02 NOTE — Patient Instructions (Addendum)
1. [facility] have been [facility]. They will call you directly to schedule an appointment.  Please call [phone] if you do not hear from them.   2. We have you scheduled for your on/off test on 09/16/16 at 2:30 pm. Please do not take your Parkinson's medications on this date. Take your last dose on 09/15/2016  (the evening dose). You will meet with our social worker prior to this appt at 1:30 pm.

## 2016-06-14 ENCOUNTER — Telehealth: Payer: Self-pay | Admitting: Neurology

## 2016-06-14 NOTE — Telephone Encounter (Signed)
Caller: Patient's husband [name]) [phone] Cell  Urgent? Yes  Reason for the call: She was seen by her PCP yesterday and was put on Aricept. Her husband called to get Dr. Doristine Devoid opinion on her taking it. He said that she had a small reaction with it but is doing fine. He would like you to please call him. Thanks

## 2016-06-14 NOTE — Telephone Encounter (Signed)
LMOM making them aware that medication is okay and to call back with any questions.

## 2016-06-14 NOTE — Telephone Encounter (Signed)
Please advise 

## 2016-06-14 NOTE — Telephone Encounter (Signed)
That is fine 

## 2016-06-14 NOTE — Telephone Encounter (Signed)
Patient husband called and states that she went to her PCP Dr Inda Merlin yesterday for her blood pressure. He put her on Aricept 5mg  and he wanted to let Dr Tat know. He wants to make sure it is ok that his wife is ok to be taking this medication

## 2016-06-14 NOTE — Telephone Encounter (Signed)
Patient made aware this is okay.

## 2016-06-21 ENCOUNTER — Ambulatory Visit: Payer: BLUE CROSS/BLUE SHIELD | Admitting: Neurology

## 2016-06-22 ENCOUNTER — Ambulatory Visit: Payer: BLUE CROSS/BLUE SHIELD | Admitting: Neurology

## 2016-08-29 ENCOUNTER — Other Ambulatory Visit: Payer: Self-pay | Admitting: Neurology

## 2016-08-29 DIAGNOSIS — G20 Parkinson's disease: Secondary | ICD-10-CM

## 2016-08-29 MED ORDER — CARBIDOPA-LEVODOPA ER 25-100 MG PO TBCR: 1.0000 | EXTENDED_RELEASE_TABLET | Freq: Every day | ORAL | 0 refills | Status: DC

## 2016-09-15 NOTE — Progress Notes (Signed)
Michelle Sims was seen today in the movement disorders clinic for neurologic consultation at the request of Josetta Huddle, MD.  The consultation is for the evaluation of PD.  The records that were made available to me were reviewed.  This patient is accompanied in the office by her spouse who supplements the history.  Pt has previously seen Dr. Erling Cruz, Dr. Maxine Glenn and Dr. Rexene Alberts.  She last saw Dr. Rexene Alberts on 02/22/16.  She was first seen by Dr. love.  I do not have his records.  She became part of a research trial for DaT scan at [facility] at the end of 2013.  She did have a DaT scan on 12/27/2012 that demonstrated absent uptake in the bilateral putamen.  Also had one in 5/20104 with similar findings (reduced uptake).  She was placed on ropinirole in December, 2013 but did not tolerate the medication.  In early, 2014 she was placed on levodopa.  Dr. Guadelupe Sabin notes indicate that she has tried to work her up over time to carbidopa/levodopa 25/100, 1-1/2 tablets 4 times per day, but the patient really could not tolerate this so last visit she told her to take carbidopa/levodopa 25/100 on the following schedule: 1.5 at 6:30-7am/1at 11am/1.5at 3pm/1 at 7pm and then she added carbidopa/levodopa 25/100 CR at bedtime (10pm).  Pt states she isn't sure why bedtime dose was added.  She doesn't note wearing off of med.  She isn't sure that med helps at all or what it helps with.  Records indicate patient has been active with [employer] programs for PD and she is still involved with these programs.   She was involved with water aerobics but had to be out of the water for a squamous cell CA removal.     Specific Symptoms:  Tremor: No. Family hx of similar:  No. Voice: some hoarseness Sleep: some trouble both getting to and staying asleep  Vivid Dreams:  No.  Acting out dreams:  Yes.   (rarely will scream out) Wet Pillows: No. Postural symptoms:  Yes.    Falls?  No. Bradykinesia symptoms: slow movements and  difficulty getting out of a chair Loss of smell:  Yes.   Loss of taste:  No. Urinary Incontinence:  No. (wears panty liner just in case) Difficulty Swallowing:  No. Handwriting, micrographia: unknown Trouble with ADL's:  No.  Trouble buttoning clothing: No. Depression:  Yes.   (just gave up driving and that was stressful) Memory changes:  Yes.   Hallucinations:  Yes.  , rare and lasts few seconds and knows not real (sees person)  visual distortions: Yes.   N/V:  No. Lightheaded:  No.  Syncope: No. Diplopia:  No. Dyskinesia:  No.  09/16/16 update:  Patient seen today in today in follow-up for her Parkinson's, accompanied by her husband who supplements the history.  They are here for her levodopa challenge.  She last took levodopa at 9:30 pm and that was the CR.  She states that she feels tired but isn't sure that she feels much different physically.  She is usually on carbidopa/levodopa 25/100:1.5/1/1.5/1 and carbidopa/levodopa 25/100 CR at night.  Her primary care physician did start her on donepezil since our last visit.  She took it twice and "it didn't agree with her."  Her husband states that she had insomnia with it and she felt foggy headed with it.    She has been back to see Dr. Gershon Crane twice since our last visit.  I  have reviewed those records.  She has had dry eye, which has contributed to some blurry vision.  Having more confusion.  States that PCP added amlodipine since our last visit.  PREVIOUS MEDICATIONS: Sinemet and Requip  ALLERGIES:   Allergies  Allergen Reactions  . Sulfa Antibiotics Rash  . Biaxin [Clarithromycin] Other (See Comments)    Pt does not remember reaction   . Ciprofloxacin Nausea And Vomiting  . Hctz [Hydrochlorothiazide] Other (See Comments)    weakness and hyponatremia  . Macrobid [Nitrofurantoin] Nausea Only  . Other Nausea Only  . Prednisone Other (See Comments)    Swelling and facial redness   . Sulfamethoxazole-Trimethoprim Other (See  Comments) and Rash    Sore throat    CURRENT MEDICATIONS:  Outpatient Encounter Prescriptions as of 09/16/2016  Medication Sig  . Ascorbic Acid (VITAMIN C PO) Take by mouth.  . Calcium Carb-Cholecalciferol (CALCIUM 600 + D PO) Take 1 tablet by mouth daily with lunch.  . carbidopa-levodopa (SINEMET IR) 25-100 MG tablet Take 1 1/2 pills 4 time a day, about 4 hours apart. (Patient taking differently: 1.5 am, 1 at 11:00 am, 1.5 at 3:00 pm, 1 at 8:00 pm)  . Carbidopa-Levodopa ER (SINEMET CR) 25-100 MG tablet controlled release Take 1 tablet by mouth at bedtime.  . cholecalciferol (VITAMIN D) 1000 units tablet Take 2,000 Units by mouth daily.  . clonazePAM (KLONOPIN) 0.5 MG tablet Take by mouth at bedtime. Quarter tablet  . co-enzyme Q-10 30 MG capsule Take 30 mg by mouth 3 (three) times daily.  . Cranberry (SM CRANBERRY) 300 MG tablet Take 300 mg by mouth daily.  . DULoxetine (CYMBALTA) 30 MG capsule Take 1 capsule (30 mg total) by mouth 2 (two) times daily.  . irbesartan (AVAPRO) 75 MG tablet Take 37.5 mg by mouth at bedtime.   Marland Kitchen levothyroxine (SYNTHROID, LEVOTHROID) 112 MCG tablet Take 112 mcg by mouth daily before breakfast.  . NONFORMULARY OR COMPOUNDED ITEM Estradiol 0.02 % (1 syringe) into the vagina at bedtime 2 times a week as directed.  . raloxifene (EVISTA) 60 MG tablet TAKE 1 TABLET (60 MG TOTAL) BY MOUTH DAILY.  Marland Kitchen amLODipine (NORVASC) 2.5 MG tablet Take 2.5 mg by mouth daily.  . [DISCONTINUED] amoxicillin-clavulanate (AUGMENTIN) 500-125 MG tablet Take 1 tablet (500 mg total) by mouth 2 (two) times daily. (Patient not taking: Reported on 06/02/2016)  . [EXPIRED] carbidopa-levodopa (SINEMET IR) 25-100 MG per tablet immediate release 2.5 tablet    No facility-administered encounter medications on file as of 09/16/2016.     PAST MEDICAL HISTORY:   Past Medical History:  Diagnosis Date  . Anxiety   . Arthritis    knee & back   . Blood type, Rh negative   . Breast cancer ([place])   .  Chronic fatigue   . Depression   . Diverticulosis   . Endocervical polyp   . GERD (gastroesophageal reflux disease)   . Hypertension   . Hypothyroidism   . IBS (irritable bowel syndrome)   . Insomnia   . Lymphedema   . Neuromuscular disorder ([place])    Parkinson's disease   . Osteopenia 06/2015   T score -2.4 distal third of the radius  . Parkinson disease ([place])   . RBBB 10/07/2013  . Thyroid cancer ([place])     PAST SURGICAL HISTORY:   Past Surgical History:  Procedure Laterality Date  . APPENDECTOMY  1979  . BREAST SURGERY  1979   MASTECTOMY AND RECONSTRUCTION  . DILATION AND CURETTAGE  OF UTERUS     MISCARRIAGE  . EYE SURGERY Bilateral    w/ IOL  . HYSTEROSCOPY  [id number]   AND D&C  . MASTECTOMY Bilateral   . THYROIDECTOMY    . TONSILLECTOMY    . TOTAL KNEE ARTHROPLASTY Right 10/07/2013   Procedure: RIGHT TOTAL KNEE ARTHROPLASTY;  Surgeon: Kerin Salen, MD;  Location: [facility];  Service: Orthopedics;  Laterality: Right;  . TUBAL LIGATION      SOCIAL HISTORY:   Social History   Social History  . Marital status: Married    Spouse name: N/A  . Number of children: N/A  . Years of education: N/A   Occupational History  . retired     "odd jobs"   Social History Main Topics  . Smoking status: Never Smoker  . Smokeless tobacco: Never Used  . Alcohol use 0.0 oz/week     Comment: rare  . Drug use: No  . Sexual activity: No     Comment: 1st intercourse 83 yo-Fewer than 5 partners   Other Topics Concern  . Not on file   Social History Narrative  . No narrative on file    FAMILY HISTORY:   Family Status  Relation Status  . Son Alive  . Son Alive  . Son Alive  . Mother Deceased at age 42  . Father Deceased at age 100  . Brother Deceased    ROS:  Review of Systems  Constitutional: Negative.   HENT: Negative.   Eyes: Positive for blurred vision.  Respiratory: Positive for shortness of breath.   Cardiovascular: Negative.   Gastrointestinal: Negative.     Genitourinary: Negative.   Musculoskeletal: Negative.   Skin: Negative.   Neurological:       Admits to loss of balance, especially when stepping off a curb   Psychiatric/Behavioral: The patient is nervous/anxious.    PHYSICAL EXAMINATION:    VITALS:   Vitals:   09/16/16 1409  BP: 128/74  Pulse: 82  SpO2: 92%  Weight: 133 lb (60.3 kg)  Height: 5' 2.5" (1.588 m)    GEN:  The patient appears stated age and is in NAD. HEENT:  Normocephalic, atraumatic.  The mucous membranes are moist. The superficial temporal arteries are without ropiness or tenderness. CV:  RRR Lungs:  CTAB Neck/HEME:  There are no carotid bruits bilaterally.  Neurological examination:  Orientation:  Montreal Cognitive Assessment  06/02/2016  Visuospatial/ Executive (0/5) 1  Naming (0/3) 3  Attention: Read list of digits (0/2) 2  Attention: Read list of letters (0/1) 1  Attention: Serial 7 subtraction starting at 100 (0/3) 3  Language: Repeat phrase (0/2) 2  Language : Fluency (0/1) 1  Abstraction (0/2) 1  Delayed Recall (0/5) 2  Orientation (0/6) 5  Total 21  Adjusted Score (based on education) 21   Cranial nerves: There is good facial symmetry. The visual fields are full to confrontational testing. The speech is fluent and clear. Soft palate rises symmetrically and there is no tongue deviation. Hearing is intact to conversational tone. Sensation: Sensation is intact to light touch throughout Motor: Strength is 5/5 in the upper and lower extremity.  Levodopa challenge done today.  UPDRS motor off score was 12.  Pt then given 250 mg of levodopa dissolved in ginger ale and waited 40 minutes to re-examine her.  UPDRS motor on score was 12.  Details of UPDRS motor score documented on separate neurophysiologic worksheet.    Movement examination: Tone: There is normal tone  in the upper and lower extremities, both on and off medication.  There is normal tone in the neck. Abnormal movements:  None Coordination:  No decremation, with any form of RAMS, including alternating supination and pronation of the forearm, hand opening and closing, finger taps, heel taps and toe taps.   Gait and Station: Whether on or off medication, she has some difficulty arising out of the chair without use of her hands.  Once up, she has a wide-based gait.  She is mildly unsteady.  She has a positive pull test.  ASSESSMENT/PLAN:  1.  Parkinson's disease by hx  -she was seen for levodopa challenge today and even off of medication she did not meet criteria for parkinsons disease.  She has been in a DaT study several years ago, but I explained that DaT is not a diagnostic test, nor is it sensitive or specific.  I do not think she has Parkinson's disease, and even if she does we proved that medication is not effective via the levodopa challenge.  The patient and her husband had many, many questions about this.  I answered these to the best of my ability.  I offered them another opinion.  In the end, we decided to slowly wean her off of levodopa and see how she did.  a weaning schedule was given.  -She did have a DaT scan on 12/27/2012 that demonstrated absent uptake in the bilateral putamen.  Also had one in 5/20104 with similar findings (reduced uptake).  2.  Dementia  -Cl[place], I think that most of their complaints surround this area.  I will refer to neuropsychology for referral/eval.  -Given donepezil by her primary care physician.  Only took it 2 nights and felt that it caused her to feel foggy.  3.  Loss of balance  -Discussed the role of EMG.  Her husband wanted to hold on that for now.  4.  I will see her back in the next 5 months.  Much greater than 50% of this 65 minute visit was spent in counseling with the patient and her husband.  This did not include the 30 minutes that was spent waiting for her medication to kick in.  Cc:  Josetta Huddle, MD

## 2016-09-16 ENCOUNTER — Other Ambulatory Visit: Payer: BLUE CROSS/BLUE SHIELD

## 2016-09-16 ENCOUNTER — Encounter: Payer: Self-pay | Admitting: Neurology

## 2016-09-16 ENCOUNTER — Ambulatory Visit (INDEPENDENT_AMBULATORY_CARE_PROVIDER_SITE_OTHER): Payer: BLUE CROSS/BLUE SHIELD | Admitting: Neurology

## 2016-09-16 VITALS — BP 128/74 | HR 82 | Ht 62.5 in | Wt 133.0 lb

## 2016-09-16 DIAGNOSIS — G20 Parkinson's disease: Secondary | ICD-10-CM | POA: Diagnosis not present

## 2016-09-16 DIAGNOSIS — F0280 Dementia in other diseases classified elsewhere without behavioral disturbance: Secondary | ICD-10-CM | POA: Diagnosis not present

## 2016-09-16 MED ORDER — CARBIDOPA-LEVODOPA 25-100 MG PO TABS
2.5000 | ORAL_TABLET | Freq: Once | ORAL | Status: AC
  Administered 2016-09-16: 2.5 via ORAL

## 2016-09-16 NOTE — Patient Instructions (Signed)
1. You can stop Levodopa as follows:  Stop Carbidopa Levodopa 50/200 CR  Decrease Carbidopa Levodopa 25/100 IR to 1 tablet 4 times daily for one week Then Decrease Carbidopa Levodopa 25/100 IR to 1 tablet 3 times daily for one week Then decrease Carbidopa Levodopa 25/100 IR to 1 tablet 2 times daily for one week Then stop medication.  2. Patients age 81+:  You have been referred for a neurocognitive evaluation in our office.   The evaluation takes approximately two hours. The first part of the appointment is a clinical interview with the neuropsychologist (Dr. Macarthur Critchley). Please bring someone with you to this appointment if possible, as it is helpful for Dr. Si Raider to hear from both you and another adult who knows you well. After speaking with Dr. Si Raider, you will complete testing with her technician. The testing includes a variety of tasks- mostly question-and-answer, some paper-and-pencil. There is nothing you need to do to prepare for this appointment, but having a good night's sleep prior to the testing, and bringing eyeglasses and hearing aids (if you wear them), is advised.   About a week after the evaluation, you will return to follow up with Dr. Si Raider to review the test results. This appointment is about 30 minutes. If you would like a family member to receive this information as well, please bring them to the appointment.   We have to reserve several hours of the neuropsychologist's time and the psychometrician's time for your evaluation appointment. As such, please note that there is a No-Show fee of $100. If you are unable to attend any of your appointments, please contact our office as soon as possible to reschedule.   3. Please let us know if you want to have EMG study to look into causes of balance issues.   4. Follow up 5 months.

## 2016-09-21 ENCOUNTER — Encounter: Payer: BLUE CROSS/BLUE SHIELD | Admitting: Gynecology

## 2016-09-21 ENCOUNTER — Telehealth: Payer: Self-pay

## 2016-09-21 ENCOUNTER — Ambulatory Visit: Payer: BLUE CROSS/BLUE SHIELD | Admitting: Neurology

## 2016-09-21 NOTE — Telephone Encounter (Signed)
I called pt and pt's husband, per Dr. Guadelupe Sabin request, and explained to them that since pt just saw [name] on 09/16/2016, it is not appropriate to see Dr. Rexene Alberts, another neurologist, 5 days after. Dr. Rexene Alberts suggests that pt follow through with [name] for now, it is not helpful to see providers back to back, this creates confusion, which makes things worse. Pt's husband demanded an appt with Dr. Rexene Alberts this morning and says that he needs to make big decisions physically and mentally regarding this pt and cannot do this without speaking with Dr. Rexene Alberts. I was on the phone with pt and husband for 15 mins explaining this. Pt's husband insisted that I speak to Dr. Rexene Alberts and explain the "urgency" of this appt and that they are "desparate" to see Dr. Rexene Alberts.  I spoke with Dr. Rexene Alberts and she reiterated that the appt this morning is not necessary and her plan of care and recommendations are to follow through seeing [name] as planned.  I called pt's husband back, spent 25 minutes on the phone with him. I explained this again, and pt's husband reports that he is concerned about Dr. Doristine Devoid plan of weaning pt of off sinemet and that [name] told him that the pt does not meet her criteria of having PD. Pt's husband reports that this a "rapid departure" from what the [facility], Dr. Erling Cruz, and Dr. Rexene Alberts have told them and have been treating the pt for. Pt's husband wants to see Dr. Rexene Alberts tomorrow or next week, which I again dissuaded, citing Dr. Guadelupe Sabin recommendation that pt follow though with [name]. Pt's husband reports that [name] told them to get a second opinion if they did not feel comfortable with her recommendations. Pt's husband is asking when the soonest appt Dr. Rexene Alberts will agree to see them will be.

## 2016-09-21 NOTE — Telephone Encounter (Signed)
I had another verbal conversation with Dr. Rexene Alberts regarding this situation. She asked me to call this pt's husband back and explain that pt should choose a neurologist and follow up with that neurologist. Pt should also cancel/notify the other office of this change, so that those appts could be offered to other patients. If pt decides to continue seeing [name], this is fine, but pt should not schedule any further appts with [place]. If pt chooses to see Dr. Rexene Alberts, then Dr. Rexene Alberts will see the pt in 3 months, or sooner appt could be made with one of our NPs, and pt should notify [facility] and cancel the appts at that office. Pt's husband is agreeable to this and very appreciative of my time and effort in handling this situation. Pt's husband reports that pt has an upcoming test and that he would like to get the results of this test before deciding on which provider to follow up with. It appears that pt's neuro psych evaluation was moved up until tomorrow from December. Pt's husband will let us know which provider they decide to keep. Pt's husband was very complimentary to Dr. Rexene Alberts and her professional judgement as well as the staff at [facility] for our handling of this situation.

## 2016-09-21 NOTE — Telephone Encounter (Signed)
Thank you Cyril Mourning, for spending time to explain this to the patient.  I would not like to interject the treatment plan as outlined by [name]. Changing appointments back and forth in not favorable for patients, confuses things, ultimately at the expense of patient well being.  Nothing further needed. Thank you!

## 2016-09-22 ENCOUNTER — Ambulatory Visit (INDEPENDENT_AMBULATORY_CARE_PROVIDER_SITE_OTHER): Payer: BLUE CROSS/BLUE SHIELD | Admitting: Psychology

## 2016-09-22 DIAGNOSIS — F32A Depression, unspecified: Secondary | ICD-10-CM

## 2016-09-22 DIAGNOSIS — R413 Other amnesia: Secondary | ICD-10-CM | POA: Diagnosis not present

## 2016-09-22 DIAGNOSIS — F329 Major depressive disorder, single episode, unspecified: Secondary | ICD-10-CM

## 2016-09-22 DIAGNOSIS — F411 Generalized anxiety disorder: Secondary | ICD-10-CM

## 2016-09-22 NOTE — Progress Notes (Signed)
NEUROPSYCHOLOGICAL INTERVIEW (CPT: [id number])  Name: Michelle Sims Date of Birth: 12-13-1929 Date of Interview: 09/22/2016  Reason for Referral:  Michelle Sims is a 83 y.o. female who is referred for neuropsychological evaluation by Dr. Wells Guiles Tat of [facility] due to concerns about possible dementia. This patient is accompanied in the office by her husband who supplements the history.  History of Presenting Problem:  Michelle Sims was followed by Dr. Erling Cruz at [facility] for parkinsonism (gait changes, balance difficulty) in the past (first started seeing him in 2004 for hip pain, per records) and ultimately diagnosed with Parkinson's disease by him. She engaged in a clinical trial at [facility] that involved two DaT scans (no trial medications). DaT scan on 12/27/2012 reportedly demonstrated absent uptake in the bilateral putamen. DaT scan in 05/2012 reportedly showed reduced uptake. She had a course of physical therapy for gait which reportedly did not help. She was seeing Dr. Rexene Alberts at [facility] for neurologic care from 2015-01/2016. She was taking Sinemet but was not sure if medications were providing any benefit. She has been active with [facility] and [employer] for PD.   She saw [name] for consultation on 06/02/2016. MoCA was 21/30. [name] felt there was a component of dementia. (Her PCP later put her on Aricept but she had insomnia and stopped it less than a week after starting it.) Dopamine challenge (on/off testing) was recommended in order to determine efficacy of her medications. That was completed on 09/16/2016. UPDRS motor off score was 12.  Pt then given 250 mg of levodopa dissolved in ginger ale and waited 40 minutes to re-examine her.  UPDRS motor on score was 12. [name] stated that even off of the medication she did not meet criteria for PD. Based on all the available data, [name] did not feel the patient has PD and even if she does, it was proven by the levodopa challenge that  medication is not effective for her. These findings/impressions were very surprising to the patient and her husband. It was explained to them that a DaT scan is not a diagnostic test and is not specific or sensitive. [name] recommended slowly weaning her off levodopa and monitoring how she does with that. A weaning schedule was given, but the patient's husband reported to me today that they have not yet decided whether to go forward with that or not.   At today's visit, the patient and her husband report a history of poor balance, slowness of gait, and momentary "freezing" upon standing up after sitting for a long time (she reports feeling that her ankles sort of shake and she waits to take a step). She reports she has a fear of stepping down from a curb. She reports a history of handwriting changes, significant fatigue (for about 10 years), longstanding loss of sense of smell, hoarseness of voice (which comes and goes), depression/anxiety and confusion/memory difficulties. She also has a history of visual hallucinations although this is rare/infrequent and lasts only a few seconds and she knows they are not real. She does not have any history of rigidity or tremor.   They report insidious onset of cognitive difficulties which have progressed over time. They report difficulty with spatial disorientation. She would be driving somewhere and suddenly not know where she was; it would look familiar but she could not figure out exactly where she was or how to get where she needed to go. She gave up driving a few  months ago which was upsetting to her. She also reports visual-spatial difficulties in other tasks such as putting away clothes and following a calendar. They report forgetfulness for recent conversations and events (often forgets details of conversations, and forgets what movie she saw recently, for example). She misplaces items (e.g., recently left a cup of coffee sitting in the medicine cabinet). She got  very confused about her pillbox and how to follow the diagram that her husband drew for her. In the middle of a sentence, she will forget what she was going to say. She has difficulty with organization, completing multi step tasks. Information processing speed may be somewhat slowed. She is indecisive, per her husband. She has word finding difficulty when speaking, and she feels she can't explain things as well as she used to. Her husband says she engages much less in group conversation, often sitting quietly, but she does not realize it. She is having comprehension difficulty.  Her husband feels she is very hard on herself. He sees that she has lost a lot of self confidence.   Of note, her husband reported that she has been taking 500 mg of CBD oil for about 6 weeks, and he wonders if that could have contributed to her performance on the levodopa challenge/off testing.    Current Functioning: Michelle Sims has lived at [facility] for five years with her husband. As noted previously, she is no longer driving. Her husband now helps with medication management and appointments. She used to do all the management of finances but about 5 years ago she started having trouble balancing the checkbook and it became too frustrating for her so he took that over.  She reports variable sleep quality. She sometimes has trouble falling asleep. She periodically screams out in her sleep from a dream, per her husband. She does not act out dreams or sleep-walk. There is no change in appetite. She eats well.    Psychiatric History: She has a history of depression and anxiety. She has seen several different mental health providers and states "nothing was particularly helpful". She has been on various medications. She is currently on Cymbalta now as she also has chronic pain. Her mood continues to be depressed regularly, and she is much more anxious than she used to be. They feel the anxiety gradually increased over time. She  worries "about everything". She denies suicidal ideation/intention.  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 After children were in school, she started getting part time jobs that interested her. Marital history: Married 45 years. 3 sons, 5 grandchildren. Alcohol: None Tobacco: never   Medical History: Past Medical History:  Diagnosis Date  . Anxiety   . Arthritis    knee & back   . Blood type, Rh negative   . Breast cancer ([place])   . Chronic fatigue   . Depression   . Diverticulosis   . Endocervical polyp   . GERD (gastroesophageal reflux disease)   . Hypertension   . Hypothyroidism   . IBS (irritable bowel syndrome)   . Insomnia   . Lymphedema   . Neuromuscular disorder ([place])    Parkinson's disease   . Osteopenia 06/2015   T score -2.4 distal third of the radius  . Parkinson disease ([place])   . RBBB 10/07/2013  . Thyroid cancer [facility])      Current Medications:  Outpatient Encounter Prescriptions as of 09/22/2016  Medication Sig  . amLODipine (NORVASC)  2.5 MG tablet Take 2.5 mg by mouth daily.  . Ascorbic Acid (VITAMIN C PO) Take by mouth.  . Calcium Carb-Cholecalciferol (CALCIUM 600 + D PO) Take 1 tablet by mouth daily with lunch.  . carbidopa-levodopa (SINEMET IR) 25-100 MG tablet Take 1 1/2 pills 4 time a day, about 4 hours apart. (Patient taking differently: 1.5 am, 1 at 11:00 am, 1.5 at 3:00 pm, 1 at 8:00 pm)  . Carbidopa-Levodopa ER (SINEMET CR) 25-100 MG tablet controlled release Take 1 tablet by mouth at bedtime.  . cholecalciferol (VITAMIN D) 1000 units tablet Take 2,000 Units by mouth daily.  . clonazePAM (KLONOPIN) 0.5 MG tablet Take by mouth at bedtime. Quarter tablet  . co-enzyme Q-10 30 MG capsule Take 30 mg by mouth 3 (three) times daily.  . Cranberry (SM CRANBERRY) 300 MG tablet Take 300 mg by mouth daily.  . DULoxetine (CYMBALTA) 30 MG capsule Take 1 capsule  (30 mg total) by mouth 2 (two) times daily.  . irbesartan (AVAPRO) 75 MG tablet Take 37.5 mg by mouth at bedtime.   Marland Kitchen levothyroxine (SYNTHROID, LEVOTHROID) 112 MCG tablet Take 112 mcg by mouth daily before breakfast.  . NONFORMULARY OR COMPOUNDED ITEM Estradiol 0.02 % (1 syringe) into the vagina at bedtime 2 times a week as directed.  . raloxifene (EVISTA) 60 MG tablet TAKE 1 TABLET (60 MG TOTAL) BY MOUTH DAILY.   No facility-administered encounter medications on file as of 09/22/2016.      Behavioral Observations:   Appearance: Neatly, casually and appropriately dressed and groomed, appearing younger than her stated age Gait: Ambulated independently with short steps and mild unsteadiness Speech: Fluent; normal rate, rhythm and volume. Thought process: Linear, goal directed Affect: Full, generally euthymic Interpersonal: Pleasant, appropriate   TESTING: There is medical necessity to proceed with neuropsychological assessment as the results will be used to aid in differential diagnosis and clinical decision-making and to inform specific treatment recommendations. Per the patient, her husband and medical records reviewed, there has been a change in cognitive functioning and a reasonable suspicion of dementia.  Following the clinical interview, the patient completed a full battery of neuropsychological testing with my psychometrician under my supervision.   PLAN: The patient will return to see me for a follow-up session at which time her test performances and my impressions and treatment recommendations will be reviewed in detail.  Full report to follow.

## 2016-09-22 NOTE — Progress Notes (Signed)
   Neuropsychology Note  Michelle Sims came in today for 2 hours of neuropsychological testing with technician, Milana Kidney, BS, under the supervision of Dr. Macarthur Critchley. The patient did appear very tired by the testing session, per behavioral observation or via self-report to the technician. Rest breaks were offered. She stated several times that she was hungry and it was difficult for her to continue if she could not eat more. She had a snack before coming into the testing room and also a snack was brought to her during testing. Michelle Sims will return within 2 weeks for a feedback session with Dr. Si Raider at which time her test performances, clinical impressions and treatment recommendations will be reviewed in detail. The patient understands she can contact our office should she require our assistance before this time.  Full report to follow.

## 2016-09-25 ENCOUNTER — Encounter: Payer: Self-pay | Admitting: Psychology

## 2016-09-27 ENCOUNTER — Other Ambulatory Visit: Payer: Self-pay | Admitting: Gynecology

## 2016-09-30 ENCOUNTER — Encounter: Payer: Self-pay | Admitting: Gynecology

## 2016-09-30 ENCOUNTER — Ambulatory Visit (INDEPENDENT_AMBULATORY_CARE_PROVIDER_SITE_OTHER): Payer: BLUE CROSS/BLUE SHIELD | Admitting: Gynecology

## 2016-09-30 VITALS — BP 116/70 | Ht 63.0 in | Wt 134.0 lb

## 2016-09-30 DIAGNOSIS — M8580 Other specified disorders of bone density and structure, unspecified site: Secondary | ICD-10-CM | POA: Diagnosis not present

## 2016-09-30 DIAGNOSIS — Z01411 Encounter for gynecological examination (general) (routine) with abnormal findings: Secondary | ICD-10-CM

## 2016-09-30 DIAGNOSIS — C50919 Malignant neoplasm of unspecified site of unspecified female breast: Secondary | ICD-10-CM

## 2016-09-30 DIAGNOSIS — N816 Rectocele: Secondary | ICD-10-CM | POA: Diagnosis not present

## 2016-09-30 DIAGNOSIS — N952 Postmenopausal atrophic vaginitis: Secondary | ICD-10-CM | POA: Diagnosis not present

## 2016-09-30 NOTE — Patient Instructions (Signed)
Follow up in one year, sooner as needed.  Schedule a mammography.

## 2016-09-30 NOTE — Progress Notes (Signed)
    Michelle Sims 26-May-1929 [id number]        83 y.o.  [id number] for annual gynecologic exam.    Past medical history,surgical history, problem list, medications, allergies, family history and social history were all reviewed and documented as reviewed in the EPIC chart.  ROS:  Performed with pertinent positives and negatives included in the history, assessment and plan.   Additional significant findings :  Fatigue as discussed below     Exam:  Michelle Sims assistant Vitals:   09/30/16 1217  BP: 116/70  Weight: 134 lb (60.8 kg)  Height: 5\' 3"  (1.6 m)   Body mass index is 23.74 kg/m.  General appearance:  Normal affect, orientation and appearance. Skin: Grossly normal HEENT: Without gross lesions.  No cervical or supraclavicular adenopathy. Thyroid normal.  Lungs:  Clear without wheezing, rales or rhonchi Cardiac: RR, without RMG Abdominal:  Soft, nontender, without masses, guarding, rebound, organomegaly or hernia Breasts:  Examined lying and sitting. Left with implant  without masses, retractions, discharge or axillary adenopathy. Right status post reconstruction with implant. No masses or axillary adenopathy  Pelvic:  Ext, BUS, Vagina: With atrophic changes. Narrowing in the mid to upper vagina. First to second-degree rectocele noted.  Cervix:  Not visualized due to vaginal narrowing.  Uterus:  Small difficult to palpate no tenderness or masses  Adnexa: Without masses or tenderness    Anus and perineum: Normal   Rectovaginal: Normal sphincter tone without palpated masses or tenderness.    Assessment/Plan:  83 y.o. [id number] female for annual gynecologic exam.   1. Postmenopausal/atrophic genital changes. Continues to use formulated vaginal estradiol cream twice weekly.  Issues of use in a patient with a history of breast cancer reviewed. Patient comfortable continuing and feels that she gets good benefit from her vaginal dryness. Has supply at home but will call when needs  more. 2. Urinary incontinence. Notes some dribbling, wears a pad which handles the issue. No urinary tract infection symptoms such as frequency dysuria or urgency low back pain fever or chills. Options for urologic referral discussed. Patient not interested at this time but prefers to monitor. 3. Osteopenia. DEXA 2017 T score -2.4. Continues on Evista. Risks versus benefits to include thrombosis risk reviewed. Patient strongly wants to continue and will call when needs refilled. 4. Rectocele. Stable on serial exams and not bothersome to the patient. Continue to monitor with annual exams. 5. Colonoscopy 2010. Repeat at their recommended interval. 6. History of breast cancer. Overdue for mammogram and she knows to schedule this. Exam NED. 7. Pap smear 2013. No Pap smear done today. No history of abnormal Pap smears review of sleep. Per current screening guidelines we both agree to stop screening based on age. 8. Health maintenance. No routine lab work done as patient does this elsewhere.    Michelle Auerbach MD, 4:25 PM 09/30/2016

## 2016-10-02 NOTE — Progress Notes (Signed)
NEUROPSYCHOLOGICAL EVALUATION   Name:    Michelle Sims  Date of Birth:   08/08/1929 Date of Interview:  09/22/2016 Date of Testing:  09/22/2016   Date of Feedback:  10/04/2016       Background Information:  Reason for Referral:  Michelle Sims is a 83 y.o. female referred by Dr. Wells Guiles Tat to assess her current level of cognitive functioning and assist in differential diagnosis. The current evaluation consisted of a review of available medical records, an interview with the patient and her husband, and the completion of a neuropsychological testing battery. Informed consent was obtained.  History of Presenting Problem:  Michelle Sims was followed by Dr. Erling Cruz at [facility] for parkinsonism (gait changes, balance difficulty) in the past (first started seeing him in 2004 for hip pain, per records) and ultimately diagnosed with Parkinson's disease by him. She engaged in a clinical trial at [facility] that involved two DaT scans (no trial medications). DaT scan on 12/27/2012 reportedly demonstrated absent uptake in the bilateral putamen. DaT scan in 05/2012 reportedly showed reduced uptake. She had a course of physical therapy for gait which reportedly did not help. She was seeing Dr. Rexene Alberts at [facility] for neurologic care from 2015-01/2016. She was taking Sinemet but was not sure if medications were providing any benefit. She has been active with [facility] and [employer] for PD.   She saw [name] for consultation on 06/02/2016. MoCA was 21/30. [name] felt there was a component of dementia. (Her PCP later put her on Aricept but she had insomnia and stopped it less than a week after starting it.) Dopamine challenge (on/off testing) was recommended in order to determine efficacy of her medications. That was completed on 09/16/2016. UPDRS motor off score was 12. Pt then given 250 mg of levodopa dissolved in ginger ale and waited 40 minutes to re-examine her. UPDRS motor on score was 12. [name] stated  that even off of the medication she did not meet criteria for PD. Based on all the available data, [name] did not feel the patient has PD and even if she does, it was proven by the levodopa challenge that medication is not effective for her. These findings/impressions were very surprising to the patient and her husband. It was explained to them that a DaT scan is not a diagnostic test and is not specific or sensitive. [name] recommended slowly weaning her off levodopa and monitoring how she does with that. A weaning schedule was given, but the patient's husband reported to me today that they have not yet decided whether to go forward with that or not.   At today's visit, the patient and her husband report a history of poor balance, slowness of gait, and momentary "freezing" upon standing up after sitting for a long time (she reports feeling that her ankles sort of shake and she waits to take a step). She reports she has a fear of stepping down from a curb. She reports a history of handwriting changes, significant fatigue (for about 10 years), longstanding loss of sense of smell, hoarseness of voice (which comes and goes), depression/anxiety and confusion/memory difficulties. She also has a history of visual hallucinations although this is rare/infrequent and lasts only a few seconds and she knows they are not real. She does not have any history of rigidity or tremor.   They report insidious onset of cognitive difficulties which have progressed over time. They report difficulty with spatial disorientation.  She would be driving somewhere and suddenly not know where she was; it would look familiar but she could not figure out exactly where she was or how to get where she needed to go. She gave up driving a few months ago which was upsetting to her. She also reports visual-spatial difficulties in other tasks such as putting away clothes and following a calendar. They report forgetfulness for recent conversations  and events (often forgets details of conversations, and forgets what movie she saw recently, for example). She misplaces items (e.g., recently left a cup of coffee sitting in the medicine cabinet). She got very confused about her pillbox and how to follow the diagram that her husband drew for her. In the middle of a sentence, she will forget what she was going to say. She has difficulty with organization, completing multi step tasks. Information processing speed may be somewhat slowed. She is indecisive, per her husband. She has word finding difficulty when speaking, and she feels she can't explain things as well as she used to. Her husband says she engages much less in group conversation, often sitting quietly, but she does not realize it. She is having comprehension difficulty.  Her husband feels she is very hard on herself. He sees that she has lost a lot of self confidence.   Of note, her husband reported that she has been taking 500 mg of CBD oil for about 6 weeks, and he wonders if that could have contributed to her performance on the levodopa challenge/off testing.    Current Functioning: Michelle Sims has lived at [facility] for five years with her husband. As noted previously, she is no longer driving. Her husband now helps with medication management and appointments. She used to do all the management of finances but about 5 years ago she started having trouble balancing the checkbook and it became too frustrating for her so he took that over.  She reports variable sleep quality. She sometimes has trouble falling asleep. She periodically screams out in her sleep from a dream, per her husband. She does not act out dreams or sleep-walk. There is no change in appetite. She eats well.    Psychiatric History: She has a history of depression and anxiety. She has seen several different mental health providers and states "nothing was particularly helpful". She has been on various medications. She  is currently on Cymbalta now as she also has chronic pain. Her mood continues to be depressed regularly, and she is much more anxious than she used to be. They feel the anxiety gradually increased over time. She worries "about everything". She denies suicidal ideation/intention.   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 After children were in school, she started getting part time jobs that interested her. Marital history: Married 27 years. 3 sons, 5 grandchildren. Alcohol: None Tobacco: never   Medical History:  Past Medical History:  Diagnosis Date  . Anxiety   . Arthritis    knee & back   . Blood type, Rh negative   . Breast cancer ([place])   . Chronic fatigue   . Depression   . Diverticulosis   . Endocervical polyp   . GERD (gastroesophageal reflux disease)   . Hypertension   . Hypothyroidism   . IBS (irritable bowel syndrome)   . Insomnia   . Lymphedema   . Neuromuscular disorder ([place])    Parkinson's disease   . Osteopenia 06/2015   T  score -2.4 distal third of the radius  . Parkinson disease ([place])   . RBBB 10/07/2013  . Thyroid cancer [facility])     Current medications:  Outpatient Encounter Prescriptions as of 10/04/2016  Medication Sig  . amLODipine (NORVASC) 2.5 MG tablet Take 2.5 mg by mouth daily.  . Ascorbic Acid (VITAMIN C PO) Take by mouth.  . Calcium Carb-Cholecalciferol (CALCIUM 600 + D PO) Take 1 tablet by mouth daily with lunch.  . carbidopa-levodopa (SINEMET IR) 25-100 MG tablet Take 1 1/2 pills 4 time a day, about 4 hours apart. (Patient taking differently: 1.5 am, 1 at 11:00 am, 1.5 at 3:00 pm, 1 at 8:00 pm)  . Carbidopa-Levodopa ER (SINEMET CR) 25-100 MG tablet controlled release Take 1 tablet by mouth at bedtime.  . cholecalciferol (VITAMIN D) 1000 units tablet Take 2,000 Units by mouth daily.  . clonazePAM (KLONOPIN) 0.5 MG tablet Take by mouth at bedtime.  Quarter tablet  . co-enzyme Q-10 30 MG capsule Take 30 mg by mouth 3 (three) times daily.  . Cranberry (SM CRANBERRY) 300 MG tablet Take 300 mg by mouth daily.  . DULoxetine (CYMBALTA) 30 MG capsule Take 1 capsule (30 mg total) by mouth 2 (two) times daily.  . irbesartan (AVAPRO) 75 MG tablet Take 37.5 mg by mouth at bedtime.   Marland Kitchen levothyroxine (SYNTHROID, LEVOTHROID) 112 MCG tablet Take 112 mcg by mouth daily before breakfast.  . NONFORMULARY OR COMPOUNDED ITEM Estradiol 0.02 % (1 syringe) into the vagina at bedtime 2 times a week as directed.  . raloxifene (EVISTA) 60 MG tablet TAKE 1 TABLET (60 MG TOTAL) BY MOUTH DAILY.   No facility-administered encounter medications on file as of 10/04/2016.      Current Examination:   Behavioral Observations:   Appearance: Neatly, casually and appropriately dressed and groomed, appearing younger than her stated age Gait: Ambulated independently with short steps and mild unsteadiness Speech: Fluent; normal rate, rhythm and volume. Thought process: Linear, goal directed Affect: Full, generally euthymic Interpersonal: Pleasant, appropriate Orientation: Oriented to person, place, day of the week and year. She was not oriented to current month or date. Accurately named the current President but could not recall his predecessor.   Tests Administered: . Test of Premorbid Functioning (TOPF) . Wechsler Adult Intelligence Scale-Fourth Edition (WAIS-IV): Similarities, Music therapist, Coding and Digit Span subtests . Wechsler Memory Scale-Fourth Edition (WMS-IV) Older Adult Version (ages 40-90): Logical Memory I, II and Recognition subtests  . [employer] Verbal Learning Test - 2nd Edition (CVLT-2) Short Form . Repeatable Battery for the Assessment of Neuropsychological Status (RBANS) Form A:  Figure Copy and Recall subtests and Semantic Fluency subtest . [place] Diagnostic Aphasia Examination: Complex Ideational Material subtest . Controlled Oral Word  Association Test (COWAT) . Trail Making Test A and B . Clock drawing test . Symbol Digit Modalities Test (SDMT) . [place] (BNT) . Geriatric Depression Scale (GDS) 15 Item . Generalized Anxiety Disorder - 7 item screener (GAD-7)  Test Results: Note: Standardized scores are presented only for use by appropriately trained professionals and to allow for any future test-retest comparison. These scores should not be interpreted without consideration of all the information that is contained in the rest of the report. The most recent standardization samples from the test publisher or other sources were used whenever possible to derive standard scores; scores were corrected for age, gender, ethnicity and education when available.   Test Scores:  Test Name Raw Score Standardized Score Descriptor  TOPF 67/70 SS=  [place] 17/36 ss= 9 Average  Block Design 3/66 ss= 2 Impaired  Coding 7/135 ss= 4 Impaired  Digit Span Forward 11/16 ss= 13 High average  Digit Span Backward 5/16 ss= 7 Low average  WMS-IV Subtests     LM I 24/53 ss= 10 Average  LM II 13/39 ss= 11 Average   LM II Recognition 20/23 Cum %: >75   RBANS Subtests     Figure Copy 7/20 Z= -5.2 Severely impaired  Figure Recall 0/20 Z= -2.8 Severely impaired  Semantic Fluency 9/40 Z= -2.3 Impaired  CVLT-II Scores     Trial 1 3/9 Z= -2.5 Impaired  Trial 4 4/9 Z= -2 Impaired  Trials 1-4 total 15/36 T= 27 Impaired  SD Free Recall 3/9 Z= -2 Impaired  LD Free Recall 2/9 Z= -1.5 Borderline  LD Cued Recall 3/9 Z= -2 Impaired  Recognition Discriminability 9/9 hits, 1 false positives Z= 0.5 Average   Forced Choice Recognition 9/9  WNL  BDAE Subtest     Complex Ideational Material 7/12  Impaired  COWAT-FAS 38 T= 51 Average  COWAT-Animals 11 T= 38 Low average  Trail Making Test A  107" 0 errors T= 32 Borderline  Trail Making Test B  Pt unable    Clock Drawing   Impaired  SDMT     Oral 6/110  Z= -3.5 Severely impaired  BNT 42/60 T= 37 Low average  GDS-15 6/15  Mild  GAD-7 7/21  Mild      Description of Test Results:  Premorbid verbal intellectual abilities were estimated to have been within the superior range based on a test of word reading. Psychomotor processing speed was impaired. Even when the motor component was removed, she demonstrated impaired processing speed. Basic auditory attention was high average while more complex auditory attention (ie working memory) was low average. Visual-spatial construction was severely impaired. Language abilities were below expectation. Specifically, confrontation naming was low average and much lower than expected based on her premorbid verbal intellectual abilities, and semantic verbal fluency was impaired to low average. Auditory comprehension of complex ideational material was impaired. With regard to verbal memory, encoding and acquisition of non-contextual information (i.e., word list) was impaired. After a brief distracter task, free recall was impaired (3/9 items recalled). After a delay, free recall was borderline impaired (2/9 items recalled). Cued recall was impaired (3/9 items recalled). Performance on a yes/no recognition task was average. On another verbal memory test, encoding and acquisition of contextual auditory information (i.e., short stories) was average. After a delay, free recall was average. Performance on a yes/no recognition task was above average. With regard to non-verbal memory, delayed free recall of visual information was severely impaired (did not recall any aspects of the original visual information presented). Executive functioning was variable. Mental flexibility and set-shifting were severely impaired; she was unable to complete Trails B. Verbal fluency with phonemic search restrictions was average. Verbal abstract reasoning was average. Performance on a clock drawing task was severely impaired. On a self-report measure  of mood, the patient's responses were indicative of mild depression and generalized anxiety at the present time.    Clinical Impressions: Mild dementia, unspecified, without behavioral disturbance. (Rule out dementia due to Parkinson's disease.) Results of cognitive testing revealed significant cognitive impairment relative to normative sample of same aged peers and certainly relative to her estimated superior premorbid intellectual abilities. Additionally, there is evidence that her cognitive deficits are interfering with her ability to  manage complex daily tasks (e.g., driving, medications). As such, diagnostic criteria for a dementia syndrome are met.  The patient's cognitive profile is reflective of predominant subcortical dysfunction. Specifically, deficits are noted in processing speed, complex attention, encoding/retrieval of non-contextual information, and mental flexibility. Additionally, severe deficits are noted in Control and instrumentation engineer. Based on test results and daily functioning, stage of dementia is characterized as mild. Her cognitive profile is NOT consistent with what is typically seen in Alzheimer's disease. Her cognitive testing and presentation are consistent with what is commonly seen in PDD and Parkinson's plus syndromes, in that there is subcortical dysfunction as well as prominent visual-spatial deficits; however, etiology of her dementia remains unclear, especially in light of recent neurologic evaluation indicating she may not have PD.    Recommendations/Plan: Based on the findings of the present evaluation, the following recommendations are offered:  1. Her husband or another caregiver should assist with management of all complex ADLs, including transportation, medications, appointments, meals and finances. I agree that she should not drive (she has already stopped driving).  2. She should continue to participate in activities that provide social interaction and physical  exercise. It sounds like [employer] and the [facility] have been very good for her. 3. She will benefit from routine and structure in her daily activities. 4. She will likely benefit from having a companion to take her out on a regular basis, for activities such as eating out or going shopping. I think this will enhance quality of life and also give her husband some respite. 5. Tax adviser and/or the [employer] Disorders social worker may be able to provide additional recommendations in terms of care planning, caregiver support, and referrals in the community. 6. Consideration of updated brain MRI, as this may be useful in differential diagnosis (e.g., determining progression/extent of small vessel disease).  7. From a neuropsychological perspective, the patient is considered an appropriate candidate for cholinesterase inhibitor therapy, and she and her husband report interest in any medication that may help slow down progression of dementia. She tried Aricept in the past for 2 days only but had negative side effects. They are interested in learning more about alternatives such as Exelon. They will follow up with [name] about this. 8. The patient is scheduled to follow up with [name] on January 3. They would like to know if having a sooner appointment is indicated/feasible. They have started weaning Sinemet and wonder if they should be seen soon after weaning is complete.   Feedback to Patient: Michelle Sims and her husband returned for a feedback appointment on 10/04/2016 to review the results of her neuropsychological evaluation with this// provider. 55 minutes face-to-face time was spent reviewing her test results, my impressions and my recommendations as detailed above.    Total time spent on this patient's case: [id number] unit for interview with psychologist; [phone] units of testing by psychometrician under psychologist's supervision; [phone] units for  medical record review, scoring of neuropsychological tests, interpretation of test results, preparation of this report, and review of results to the patient by psychologist.      Thank you for your referral of Michelle Sims. Please feel free to contact me if you have any questions or concerns regarding this report.

## 2016-10-04 ENCOUNTER — Encounter: Payer: Self-pay | Admitting: Psychology

## 2016-10-04 ENCOUNTER — Ambulatory Visit (INDEPENDENT_AMBULATORY_CARE_PROVIDER_SITE_OTHER): Payer: BLUE CROSS/BLUE SHIELD | Admitting: Psychology

## 2016-10-04 DIAGNOSIS — G20 Parkinson's disease: Secondary | ICD-10-CM | POA: Diagnosis not present

## 2016-10-04 DIAGNOSIS — F0280 Dementia in other diseases classified elsewhere without behavioral disturbance: Secondary | ICD-10-CM | POA: Diagnosis not present

## 2016-10-10 ENCOUNTER — Telehealth: Payer: Self-pay

## 2016-10-10 NOTE — Telephone Encounter (Signed)
Patient's husband called to reiterate what I explained to the patient earlier.  He said her biggest concern had been with the mention of "malignant neoplasm of the breast".  He said that had been years ago and it had not been on anything in years and it upset her because she thought something was wrong. I reassured him that it is just part of her medical history and that is why it appeared on AVS.  He was reassured after our conversation.

## 2016-10-10 NOTE — Telephone Encounter (Addendum)
Patient advised and reassured. 

## 2016-10-10 NOTE — Telephone Encounter (Signed)
Friday September 30, 2016. The following issues were addressed: . Encounter for gynecological examination with abnormal finding . Rectocele . Postmenopausal atrophic vaginitis . Malignant neoplasm of female breast, unspecified estrogen receptor status, unspecified laterality, unspecified site of breast ([place]) . Osteopenia, unspecified location   Patient's husband called twice in voicemail this morning stating patient very worried about these diagnoses on her AVS provided at visit last week. She said you told her all was fine and you would see her next year. He said she is certain these diagnoses do not belong to her and wants it corrected.

## 2016-10-10 NOTE — Telephone Encounter (Signed)
The diagnoses all pertaining to her:  #1 she was there for a GYN exam #2 she has a small weakness in the back of the vagina wall called a rectocele that she has had for a long time, is having no symptoms from that requires no intervention but we noted just so we can look at it each year. Is something that she would not notice on her own but only during a vaginal exam. #3 at age 83 all women have issues with atrophic vaginitis which means lack of estrogen to the vaginal tissues. #4 she has a history of breast cancer and it was noted #5 she has osteopenia which is loss of calcium from the bones but not to the degree of osteoporosis. She has had this and we are following her for this with the bone densities. This is the benefit of her Evista which is treating this.

## 2016-10-11 ENCOUNTER — Telehealth: Payer: Self-pay | Admitting: Neurology

## 2016-10-11 NOTE — Telephone Encounter (Signed)
He called back to say that he also is noticing that she is Shuffling her feet. Thanks

## 2016-10-11 NOTE — Telephone Encounter (Signed)
Patient's husband called regarding her medication (Sinemet). 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 They will not be home this Evening. He said he will call back in the morning. The report that was sent to [name] from Dr. Si Raider stating Dementia due to parkinson's Disease without behavioral Disturbance.  He would like to speak with you regarding the report and medication. Please Advise. Thanks

## 2016-10-12 ENCOUNTER — Telehealth: Payer: Self-pay | Admitting: Neurology

## 2016-10-12 NOTE — Telephone Encounter (Signed)
Patient's husband calling to speak with you. Please call. Thanks

## 2016-10-12 NOTE — Telephone Encounter (Signed)
Dr. Si Raider has reviewed her findings in detail with them.  If she would like to go back on levodopa, we can do that, although we didn't see much benefit that day.

## 2016-10-12 NOTE — Telephone Encounter (Signed)
She had Levodopa challenge in August and weaned off Levodopa. Husband states she is worse. See note below.   Next follow up isn't until January. Please advise.

## 2016-10-12 NOTE — Telephone Encounter (Signed)
Reed, Brandy L routed conversation to [employer] 6 minutes ago (10:23 AM)    Mariea Clonts, Brandy L 7 minutes ago (10:22 AM)      Patient's husband calling to speak with you. Please call. Thanks      Documentation     Lorrane, Mccay [phone] contacted Vanetta Shawl L 8 minutes ago (10:21 AM)    Incoming call:  CELL [phone]

## 2016-10-12 NOTE — Telephone Encounter (Signed)
Other encounter already open.

## 2016-10-12 NOTE — Telephone Encounter (Signed)
Spoke with patient and husband. They will restart medication. They have a titration schedule for stopping medication and they will just do this in reverse to restart. Husband expressed understanding.  Aware if they want to discuss with [name] the neurocognitive results, she can go over Dr. Marcia Brash findings with them at their follow up appt since Dr. Si Raider already discussed with them.  They agree with this plan.

## 2016-10-20 DIAGNOSIS — Z85850 Personal history of malignant neoplasm of thyroid: Secondary | ICD-10-CM | POA: Diagnosis not present

## 2016-10-20 DIAGNOSIS — Z23 Encounter for immunization: Secondary | ICD-10-CM | POA: Diagnosis not present

## 2016-10-20 DIAGNOSIS — E039 Hypothyroidism, unspecified: Secondary | ICD-10-CM | POA: Diagnosis not present

## 2016-10-26 DIAGNOSIS — Z85850 Personal history of malignant neoplasm of thyroid: Secondary | ICD-10-CM | POA: Diagnosis not present

## 2016-10-26 DIAGNOSIS — Z23 Encounter for immunization: Secondary | ICD-10-CM | POA: Diagnosis not present

## 2016-10-26 DIAGNOSIS — R5383 Other fatigue: Secondary | ICD-10-CM | POA: Diagnosis not present

## 2016-10-26 DIAGNOSIS — E039 Hypothyroidism, unspecified: Secondary | ICD-10-CM | POA: Diagnosis not present

## 2016-10-28 ENCOUNTER — Telehealth: Payer: Self-pay | Admitting: Neurology

## 2016-10-28 NOTE — Telephone Encounter (Signed)
Patient husband wants to talk to someone about getting in here to see DR Tat. They are very concerned about what Dr Si Raider  Report showed. He wants to get answer because he states that Dr Si Raider and Dr Tat may not agree and they need to know what they need to do and where to go from here. He wants her to come in ASAP

## 2016-10-28 NOTE — Telephone Encounter (Signed)
I have talked to husband about this already. No sooner appts at the moment but looking for cancellations.

## 2016-10-31 NOTE — Telephone Encounter (Signed)
Patient's husband made aware no sooner appts right now but she is on cancellation list.

## 2016-11-01 ENCOUNTER — Other Ambulatory Visit: Payer: Self-pay | Admitting: Internal Medicine

## 2016-11-01 DIAGNOSIS — Z1231 Encounter for screening mammogram for malignant neoplasm of breast: Secondary | ICD-10-CM

## 2016-11-03 NOTE — Progress Notes (Signed)
[facility] was seen today in the movement disorders clinic for neurologic consultation at the request of Michelle Huddle, MD.  The consultation is for the evaluation of PD.  The records that were made available to me were reviewed.  This patient is accompanied in the office by her spouse who supplements the history.  Pt has previously seen Dr. Erling [facility], Dr. Maxine [facility] and Dr. Rexene [facility].  She last saw Dr. Rexene [facility] on 02/22/16.  She was first seen by Dr. love.  I do not have his records.  She became part of a research trial for [facility] at [facility] at the end of 2013.  She did have a [facility] on 12/27/2012 that demonstrated absent uptake in the bilateral putamen.  Also had one in 5/20104 with similar findings (reduced uptake).  She was placed on ropinirole in December, 2013 but did not tolerate the medication.  In early, 2014 she was placed on levodopa.  Dr. Guadelupe [facility] notes indicate that she has tried to work her up over time to carbidopa/levodopa 25/100, 1-1/2 tablets 4 times per day, but the patient really could not tolerate this so last visit she told her to take carbidopa/levodopa 25/100 on the following schedule: 1.5 at 6:30-7am/1at 11am/1.5at 3pm/1 at 7pm and then she added carbidopa/levodopa 25/100 CR at bedtime (10pm).  Pt states she isn't sure why bedtime dose was added.  She doesn't note wearing off of med.  She isn't sure that med helps at all or what it helps with.  Records indicate patient has been active with [facility] and boxing programs for PD and she is still involved with these programs.   She was involved with water aerobics but had to be out of the water for a squamous cell CA removal.     Specific Symptoms:  Tremor: No. Family hx of similar:  No. Voice: some hoarseness Sleep: some trouble both getting to and staying asleep  Vivid Dreams:  No.  Acting out dreams:  Yes.   (rarely will scream out) Wet Pillows: No. Postural symptoms:  Yes.    Falls?  No. Bradykinesia symptoms: slow movements and  difficulty getting out of a chair Loss of smell:  Yes.   Loss of taste:  No. Urinary Incontinence:  No. (wears panty liner just in case) Difficulty Swallowing:  No. Handwriting, micrographia: unknown Trouble with ADL's:  No.  Trouble buttoning clothing: No. Depression:  Yes.   (just gave up driving and that was stressful) Memory changes:  Yes.   Hallucinations:  Yes.  , rare and lasts few seconds and knows not real (sees person)  visual distortions: Yes.   N/V:  No. Lightheaded:  No.  Syncope: No. Diplopia:  No. Dyskinesia:  No.  09/16/16 update:  Patient seen today in today in follow-up for her Parkinson's, accompanied by her husband who supplements the history.  They are here for her levodopa challenge.  She last took levodopa at 9:30 pm and that was the CR.  She states that she feels tired but isn't sure that she feels much different physically.  She is usually on carbidopa/levodopa 25/100:1.5/1/1.5/1 and carbidopa/levodopa 25/100 CR at night.  Her primary care physician did start her on donepezil since our last visit.  She took it twice and "it didn't agree with her."  Her husband states that she had insomnia with it and she felt foggy headed with it.    She has been back to see Dr. Gershon [facility] twice since our last visit.  I  have reviewed those records.  She has had dry eye, which has contributed to some blurry vision.  Having more confusion.  States that PCP added amlodipine since our last visit.  11/04/16 update:  Patient seen in follow-up for her memory change.  We did a levodopa challenge test last visit and it was my recommendation that we wean her off of levodopa.  Right after our visit, the patient's husband called [facility] and wanted to be seen at their office.  They suggested that the patient make a choice of neurologists, given that she has seen multiple neurologists in the past.  They called me after attempting to wean the medication and felt that she was doing worse, so she  went back on it.  She is on carbidopa/levodopa 25/100:1.5/1/1.5/1 and carbidopa/levodopa 25/100 CR at night.   Patient was seen on 09/22/2016 by Dr. [facility] and had neurocognitive testing.  Patients testing was reviewed in detail with the patient and her husband.  Testing demonstrated mild dementia.  Acetylcholinesterase inhibitors were recommended.  She had tried Aricept in the past and did not tolerate it.  She only tried it for 2 days.  C/o poor energy and "grogginess."  When asked when on klonopin, only 1/4 of tablet, states that "I have done it so long that it was felt I should continue."  Doing RSB and water aerobics.    PREVIOUS MEDICATIONS: Sinemet and Requip, aricept  ALLERGIES:   Allergies  Allergen Reactions  . Sulfa Antibiotics Rash  . Biaxin [Clarithromycin] Other (See Comments)    Pt does not remember reaction   . Ciprofloxacin Nausea And Vomiting  . Hctz [Hydrochlorothiazide] Other (See Comments)    weakness and hyponatremia  . Macrobid [Nitrofurantoin] Nausea Only  . Other Nausea Only  . Prednisone Other (See Comments)    Swelling and facial redness   . Sulfamethoxazole-Trimethoprim Other (See Comments) and Rash    Sore throat    CURRENT MEDICATIONS:  Outpatient Encounter Prescriptions as of 11/04/2016  Medication Sig  . amLODipine (NORVASC) 2.5 MG tablet Take 2.5 mg by mouth daily.  . Ascorbic Acid (VITAMIN C PO) Take by mouth.  . Calcium Carb-Cholecalciferol (CALCIUM 600 + D PO) Take 1 tablet by mouth daily with lunch.  . carbidopa-levodopa (SINEMET IR) 25-100 MG tablet Take 1 1/2 pills 4 time a day, about 4 hours apart. (Patient taking differently: 1.5 am, 1 at 11:00 am, 1.5 at 3:00 pm, 1 at 8:00 pm)  . Carbidopa-Levodopa ER (SINEMET CR) 25-100 MG tablet controlled release Take 1 tablet by mouth at bedtime.  . cholecalciferol (VITAMIN D) 1000 units tablet Take 2,000 Units by mouth daily.  . clonazePAM (KLONOPIN) 0.5 MG tablet Take by mouth at bedtime. Quarter tablet    . co-enzyme Q-10 30 MG capsule Take 30 mg by mouth 3 (three) times daily.  . Cranberry (SM CRANBERRY) 300 MG tablet Take 300 mg by mouth daily.  . DULoxetine (CYMBALTA) 30 MG capsule Take 1 capsule (30 mg total) by mouth 2 (two) times daily.  . irbesartan (AVAPRO) 75 MG tablet Take 37.5 mg by mouth at bedtime.   Marland Kitchen levothyroxine (SYNTHROID, LEVOTHROID) 112 MCG tablet Take 112 mcg by mouth daily before breakfast.  . NONFORMULARY OR COMPOUNDED ITEM Estradiol 0.02 % (1 syringe) into the vagina at bedtime 2 times a week as directed.  . raloxifene (EVISTA) 60 MG tablet TAKE 1 TABLET (60 MG TOTAL) BY MOUTH DAILY.   No facility-administered encounter medications on file as of 11/04/2016.  PAST MEDICAL HISTORY:   Past Medical History:  Diagnosis Date  . Anxiety   . Arthritis    knee & back   . Blood type, Rh negative   . Breast cancer ([place])   . Chronic fatigue   . Depression   . Diverticulosis   . Endocervical polyp   . GERD (gastroesophageal reflux disease)   . Hypertension   . Hypothyroidism   . IBS (irritable bowel syndrome)   . Insomnia   . Lymphedema   . Neuromuscular disorder ([place])    Parkinson's disease   . Osteopenia 06/2015   T score -2.4 distal third of the radius  . Parkinson disease ([place])   . RBBB 10/07/2013  . Thyroid cancer ([place])     PAST SURGICAL HISTORY:   Past Surgical History:  Procedure Laterality Date  . APPENDECTOMY  1979  . BREAST SURGERY  1979   MASTECTOMY AND RECONSTRUCTION  . DILATION AND CURETTAGE OF UTERUS     MISCARRIAGE  . EYE SURGERY Bilateral    w/ IOL  . HYSTEROSCOPY  [id number]   AND D&C  . MASTECTOMY Bilateral   . THYROIDECTOMY    . TONSILLECTOMY    . TOTAL KNEE ARTHROPLASTY Right 10/07/2013   Procedure: RIGHT TOTAL KNEE ARTHROPLASTY;  Surgeon: Kerin Salen, MD;  [facility]: [facility];  Service: Orthopedics;  Laterality: Right;  . TUBAL LIGATION      SOCIAL HISTORY:   Social History   Social History  . Marital status: Married     Spouse name: N/A  . Number of children: N/A  . Years of education: N/A   Occupational History  . retired     "odd jobs"   Social History Main Topics  . Smoking status: Never Smoker  . Smokeless tobacco: Never Used  . Alcohol use 0.0 oz/week     Comment: rare  . Drug use: No  . Sexual activity: No     Comment: 1st intercourse 83 yo-Fewer than 5 partners   Other Topics Concern  . Not on file   Social History Narrative  . No narrative on file    FAMILY HISTORY:   Family Status  Relation Status  . Son Alive  . Son Alive  . Son Alive  . Mother Deceased at age 108  . Father Deceased at age 53  . Brother Deceased    ROS:  Review of Systems  Constitutional: Negative.   HENT: Negative.   Eyes: Positive for blurred vision.  Respiratory: Positive for shortness of breath.   Cardiovascular: Negative.   Gastrointestinal: Negative.   Genitourinary: Negative.   Musculoskeletal: Negative.   Skin: Negative.   Neurological:       Admits to loss of balance, especially when stepping off a curb   Psychiatric/Behavioral: The patient is nervous/anxious.    PHYSICAL EXAMINATION:    VITALS:   There were no vitals filed for this visit.  GEN:  The patient appears stated age and is in NAD. HEENT:  Normocephalic, atraumatic.  The mucous membranes are moist. The superficial temporal arteries are without ropiness or tenderness. CV:  RRR Lungs:  CTAB Neck/HEME:  There are no carotid bruits bilaterally.  Neurological examination:  Orientation:  Montreal Cognitive Assessment  06/02/2016  Visuospatial/ Executive (0/5) 1  Naming (0/3) 3  Attention: Read list of digits (0/2) 2  Attention: Read list of letters (0/1) 1  Attention: Serial 7 subtraction starting at 100 (0/3) 3  Language: Repeat phrase (0/2) 2  Language :  Fluency (0/1) 1  Abstraction (0/2) 1  Delayed Recall (0/5) 2  Orientation (0/6) 5  Total 21  Adjusted Score (based on education) 21   Cranial nerves: There is good  facial symmetry. The visual fields are full to confrontational testing. The speech is fluent and clear. Soft palate rises symmetrically and there is no tongue deviation. Hearing is intact to conversational tone. Sensation: Sensation is intact to light touch throughout Motor: Strength is 5/5 in the upper and lower extremity.   Movement examination: Tone: There is normal tone in the upper and lower extremities, both on and off medication.  There is normal tone in the neck. Abnormal movements: None Coordination:  No decremation, with any form of RAMS, including alternating supination and pronation of the forearm, hand opening and closing, finger taps, heel taps and toe taps.   Gait and Station: she has mild start hesitation.  She gets in the hall and walks well  ASSESSMENT/PLAN:  1.  Parkinson's disease by hx  -she was seen for levodopa challenge 09/16/16 and even off of medication she did not meet criteria for parkinsons disease.  She has been in a Michelle study several years ago, but I explained that Michelle is not a diagnostic test, nor is it sensitive or specific.  I do not think she has Parkinson's disease.  She (and particularly her husband) were thrown off by this and stated that she did worse off of the medication.  We placed her back on it and she is on carbidopa/levodopa 25/100:1.5/1/1.5/1 and carbidopa/levodopa 25/100 CR at night.   2.  Dementia  -Neurocognitive testing demonstrated mild dementia.  She tried Aricept in the past and felt that she did not tolerate it.  She only tried it for 2 days.  -Talked about Exelon patch and will start that, 4.6 mg.  Will increase next visit if tolerated.    Risks, benefits, side effects and alternative therapies were discussed.  The opportunity to ask questions was given and they were answered to the best of my ability.  The patient expressed understanding and willingness to follow the outlined treatment protocols.  -met with social work resources today  3.   AM fatigue  -would recommend fatigue stop klonopin.  Only on 1/4 of 0.5 mg tablet  4.  Follow up is anticipated in the next few months, sooner should new neurologic issues arise.  Much greater than 50% of this visit was spent in counseling and coordinating care.  Total face to face time:  25 min  Cc:  Michelle Huddle, MD

## 2016-11-04 ENCOUNTER — Encounter: Payer: Self-pay | Admitting: Neurology

## 2016-11-04 ENCOUNTER — Ambulatory Visit (INDEPENDENT_AMBULATORY_CARE_PROVIDER_SITE_OTHER): Payer: BLUE CROSS/BLUE SHIELD | Admitting: Neurology

## 2016-11-04 ENCOUNTER — Encounter: Payer: Self-pay | Admitting: Psychology

## 2016-11-04 VITALS — BP 122/70 | HR 92 | Ht 63.0 in | Wt 135.0 lb

## 2016-11-04 DIAGNOSIS — F0280 Dementia in other diseases classified elsewhere without behavioral disturbance: Secondary | ICD-10-CM

## 2016-11-04 DIAGNOSIS — G20 Parkinson's disease: Secondary | ICD-10-CM | POA: Diagnosis not present

## 2016-11-04 MED ORDER — RIVASTIGMINE 4.6 MG/24HR TD PT24: 4.6000 mg | MEDICATED_PATCH | Freq: Every day | TRANSDERMAL | 3 refills | Status: DC

## 2016-11-04 NOTE — Progress Notes (Signed)
I met with Mrs. [facility] and her husband today while they are in clinic. I shared my contact information and a gave an overview of what my services are. We talked a very little about dementia and some of the losses that she is experiencing lately. I will provide them information on the Early Dementia program through through the [employer] as they may benefit from this program and finding out about the support and resources.

## 2016-11-07 ENCOUNTER — Telehealth: Payer: Self-pay | Admitting: Neurology

## 2016-11-07 MED ORDER — RIVASTIGMINE 4.6 MG/24HR TD PT24: 4.6000 mg | MEDICATED_PATCH | Freq: Every day | TRANSDERMAL | 3 refills | Status: DC

## 2016-11-07 NOTE — Telephone Encounter (Signed)
Patients husband  states that [facility] on [address] did not have power Friday and could we please call in his wife patches to them again.

## 2016-11-07 NOTE — Telephone Encounter (Signed)
RX resent

## 2016-11-16 ENCOUNTER — Other Ambulatory Visit: Payer: Self-pay | Admitting: Internal Medicine

## 2016-11-16 ENCOUNTER — Ambulatory Visit
Admission: RE | Admit: 2016-11-16 | Discharge: 2016-11-16 | Disposition: A | Discharge status: Home / Self Care | Payer: BLUE CROSS/BLUE SHIELD | Source: Ambulatory Visit | Attending: Internal Medicine | Admitting: Internal Medicine

## 2016-11-16 DIAGNOSIS — Z1231 Encounter for screening mammogram for malignant neoplasm of breast: Secondary | ICD-10-CM

## 2016-11-21 ENCOUNTER — Encounter: Payer: BLUE CROSS/BLUE SHIELD | Admitting: Psychology

## 2016-11-24 ENCOUNTER — Other Ambulatory Visit: Payer: Self-pay | Admitting: Neurology

## 2016-11-24 DIAGNOSIS — G20 Parkinson's disease: Secondary | ICD-10-CM

## 2016-11-30 DIAGNOSIS — G4710 Hypersomnia, unspecified: Secondary | ICD-10-CM | POA: Diagnosis not present

## 2016-11-30 DIAGNOSIS — R413 Other amnesia: Secondary | ICD-10-CM | POA: Diagnosis not present

## 2016-11-30 DIAGNOSIS — K219 Gastro-esophageal reflux disease without esophagitis: Secondary | ICD-10-CM | POA: Diagnosis not present

## 2016-11-30 DIAGNOSIS — K5900 Constipation, unspecified: Secondary | ICD-10-CM | POA: Diagnosis not present

## 2016-12-06 ENCOUNTER — Other Ambulatory Visit: Payer: Self-pay | Admitting: *Deleted

## 2016-12-06 MED ORDER — NONFORMULARY OR COMPOUNDED ITEM: 3 refills | Status: DC

## 2016-12-06 NOTE — Telephone Encounter (Signed)
Rx called in for estradiol 0.02% cream

## 2016-12-07 ENCOUNTER — Telehealth: Payer: Self-pay | Admitting: Neurology

## 2016-12-07 DIAGNOSIS — G20 Parkinson's disease: Secondary | ICD-10-CM

## 2016-12-07 MED ORDER — CARBIDOPA-LEVODOPA 25-100 MG PO TABS: ORAL_TABLET | ORAL | 1 refills | Status: DC

## 2016-12-07 NOTE — Telephone Encounter (Signed)
Patient should be on 5 pills a day. Called to confirm and patient will have husband reach back out to me.

## 2016-12-07 NOTE — Telephone Encounter (Signed)
Patient needs to have her 90 day supply of the carbidopa levodopa she takes 6 pills a day called in to the [facility] [address]

## 2016-12-07 NOTE — Telephone Encounter (Signed)
Patient's husband called back to confirm dosage. RX sent to pharmacy.

## 2016-12-08 ENCOUNTER — Other Ambulatory Visit: Payer: Self-pay | Admitting: Gynecology

## 2016-12-19 DIAGNOSIS — L853 Xerosis cutis: Secondary | ICD-10-CM | POA: Diagnosis not present

## 2016-12-19 DIAGNOSIS — L821 Other seborrheic keratosis: Secondary | ICD-10-CM | POA: Diagnosis not present

## 2016-12-19 DIAGNOSIS — Z85828 Personal history of other malignant neoplasm of skin: Secondary | ICD-10-CM | POA: Diagnosis not present

## 2016-12-19 DIAGNOSIS — L570 Actinic keratosis: Secondary | ICD-10-CM | POA: Diagnosis not present

## 2016-12-19 DIAGNOSIS — L723 Sebaceous cyst: Secondary | ICD-10-CM | POA: Diagnosis not present

## 2016-12-20 NOTE — Progress Notes (Signed)
Michelle Sims was seen today in the movement disorders clinic for neurologic consultation at the request of Josetta Huddle, MD.  The consultation is for the evaluation of PD.  The records that were made available to me were reviewed.  This patient is accompanied in the office by her spouse who supplements the history.  Pt has previously seen Dr. Erling Cruz, Dr. Maxine Glenn and Dr. Rexene Alberts.  She last saw Dr. Rexene Alberts on 02/22/16.  She was first seen by Dr. love.  I do not have his records.  She became part of a research trial for DaT scan at [facility] at the end of 2013.  She did have a DaT scan on 12/27/2012 that demonstrated absent uptake in the bilateral putamen.  Also had one in 5/20104 with similar findings (reduced uptake).  She was placed on ropinirole in December, 2013 but did not tolerate the medication.  In early, 2014 she was placed on levodopa.  Dr. Guadelupe Sabin notes indicate that she has tried to work her up over time to carbidopa/levodopa 25/100, 1-1/2 tablets 4 times per day, but the patient really could not tolerate this so last visit she told her to take carbidopa/levodopa 25/100 on the following schedule: 1.5 at 6:30-7am/1at 11am/1.5at 3pm/1 at 7pm and then she added carbidopa/levodopa 25/100 CR at bedtime (10pm).  Pt states she isn't sure why bedtime dose was added.  She doesn't note wearing off of med.  She isn't sure that med helps at all or what it helps with.  Records indicate patient has been active with [employer] programs for PD and she is still involved with these programs.   She was involved with water aerobics but had to be out of the water for a squamous cell CA removal.     Specific Symptoms:  Tremor: No. Family hx of similar:  No. Voice: some hoarseness Sleep: some trouble both getting to and staying asleep  Vivid Dreams:  No.  Acting out dreams:  Yes.   (rarely will scream out) Wet Pillows: No. Postural symptoms:  Yes.    Falls?  No. Bradykinesia symptoms: slow movements and  difficulty getting out of a chair Loss of smell:  Yes.   Loss of taste:  No. Urinary Incontinence:  No. (wears panty liner just in case) Difficulty Swallowing:  No. Handwriting, micrographia: unknown Trouble with ADL's:  No.  Trouble buttoning clothing: No. Depression:  Yes.   (just gave up driving and that was stressful) Memory changes:  Yes.   Hallucinations:  Yes.  , rare and lasts few seconds and knows not real (sees person)  visual distortions: Yes.   N/V:  No. Lightheaded:  No.  Syncope: No. Diplopia:  No. Dyskinesia:  No.  09/16/16 update:  Patient seen today in today in follow-up for her Parkinson's, accompanied by her husband who supplements the history.  They are here for her levodopa challenge.  She last took levodopa at 9:30 pm and that was the CR.  She states that she feels tired but isn't sure that she feels much different physically.  She is usually on carbidopa/levodopa 25/100:1.5/1/1.5/1 and carbidopa/levodopa 25/100 CR at night.  Her primary care physician did start her on donepezil since our last visit.  She took it twice and "it didn't agree with her."  Her husband states that she had insomnia with it and she felt foggy headed with it.    She has been back to see Dr. Gershon Crane twice since our last visit.  I  have reviewed those records.  She has had dry eye, which has contributed to some blurry vision.  Having more confusion.  States that PCP added amlodipine since our last visit.  11/04/16 update:  Patient seen in follow-up for her memory change.  We did a levodopa challenge test last visit and it was my recommendation that we wean her off of levodopa.  Right after our visit, the patient's husband called [facility] and wanted to be seen at their office.  They suggested that the patient make a choice of neurologists, given that she has seen multiple neurologists in the past.  They called me after attempting to wean the medication and felt that she was doing worse, so she  went back on it.  She is on carbidopa/levodopa 25/100:1.5/1/1.5/1 and carbidopa/levodopa 25/100 CR at night.   Patient was seen on 09/22/2016 by Dr. Si Raider and had neurocognitive testing.  Patients testing was reviewed in detail with the patient and her husband.  Testing demonstrated mild dementia.  Acetylcholinesterase inhibitors were recommended.  She had tried Aricept in the past and did not tolerate it.  She only tried it for 2 days.  C/o poor energy and "grogginess."  When asked when on klonopin, only 1/4 of tablet, states that "I have done it so long that it was felt I should continue."  Doing RSB and water aerobics.    12/21/16 update: Patient was seen as a work in today.  She is accompanied by her husband who supplements the history.  She is on carbidopa/levodopa 25/100: 1.5 tablets / 1 tablet/1.5 tablets / 1 tablet and carbidopa/levodopa 25/100 CR at bedtime.  Husband asks me about taking all CR at night.  No falls.   She was started on the Exelon patch last visit.  She is on 4.6 mg.  She has had no side effects with that.  No site reactions.  Rotating sites.  Ask me to change that to 90 day RX.  Her husband cannot understand why she is confused about dates.  Asks about constipation  PREVIOUS MEDICATIONS: Sinemet and Requip, aricept  ALLERGIES:   Allergies  Allergen Reactions  . Sulfa Antibiotics Rash  . Biaxin [Clarithromycin] Other (See Comments)    Pt does not remember reaction   . Ciprofloxacin Nausea And Vomiting  . Hctz [Hydrochlorothiazide] Other (See Comments)    weakness and hyponatremia  . Macrobid [Nitrofurantoin] Nausea Only  . Other Nausea Only  . Prednisone Other (See Comments)    Swelling and facial redness   . Sulfamethoxazole-Trimethoprim Other (See Comments) and Rash    Sore throat    CURRENT MEDICATIONS:  Outpatient Encounter Medications as of 12/21/2016  Medication Sig  . amLODipine (NORVASC) 2.5 MG tablet Take 2.5 mg by mouth daily.  . Ascorbic Acid (VITAMIN  C PO) Take by mouth.  . Calcium Carb-Cholecalciferol (CALCIUM 600 + D PO) Take 1 tablet by mouth daily with lunch.  . carbidopa-levodopa (SINEMET IR) 25-100 MG tablet Take 1 1/2 pills 4 time a day, about 4 hours apart.  . Carbidopa-Levodopa ER (SINEMET CR) 25-100 MG tablet controlled release TAKE 1 TABLET BY MOUTH EVERYDAY AT BEDTIME  . cholecalciferol (VITAMIN D) 1000 units tablet Take 2,000 Units by mouth daily.  Marland Kitchen co-enzyme Q-10 30 MG capsule Take 30 mg by mouth 3 (three) times daily.  . Cranberry (SM CRANBERRY) 300 MG tablet Take 300 mg by mouth daily.  . DULoxetine (CYMBALTA) 30 MG capsule Take 1 capsule (30 mg total) by mouth 2 (two)  times daily.  . irbesartan (AVAPRO) 75 MG tablet Take 37.5 mg by mouth at bedtime.   Marland Kitchen levothyroxine (SYNTHROID, LEVOTHROID) 112 MCG tablet Take 112 mcg by mouth daily before breakfast.  . NONFORMULARY OR COMPOUNDED ITEM Estradiol 0.02 % (1 syringe) into the vagina at bedtime 2 times a week as directed.  . raloxifene (EVISTA) 60 MG tablet TAKE 1 TABLET (60 MG TOTAL) BY MOUTH DAILY.  . rivastigmine (EXELON) 4.6 mg/24hr Place 1 patch (4.6 mg total) onto the skin daily.  . [DISCONTINUED] clonazePAM (KLONOPIN) 0.5 MG tablet Take by mouth at bedtime. Quarter tablet   No facility-administered encounter medications on file as of 12/21/2016.     PAST MEDICAL HISTORY:   Past Medical History:  Diagnosis Date  . Anxiety   . Arthritis    knee & back   . Blood type, Rh negative   . Breast cancer ([place])   . Chronic fatigue   . Depression   . Diverticulosis   . Endocervical polyp   . GERD (gastroesophageal reflux disease)   . Hypertension   . Hypothyroidism   . IBS (irritable bowel syndrome)   . Insomnia   . Lymphedema   . Neuromuscular disorder ([place])    Parkinson's disease   . Osteopenia 06/2015   T score -2.4 distal third of the radius  . Parkinson disease ([place])   . RBBB 10/07/2013  . Thyroid cancer ([place])     PAST SURGICAL HISTORY:   Past Surgical  History:  Procedure Laterality Date  . APPENDECTOMY  1979  . BREAST SURGERY  1979   MASTECTOMY AND RECONSTRUCTION  . DILATION AND CURETTAGE OF UTERUS     MISCARRIAGE  . EYE SURGERY Bilateral    w/ IOL  . HYSTEROSCOPY  [id number]   AND D&C  . MASTECTOMY Bilateral   . THYROIDECTOMY    . TONSILLECTOMY    . TOTAL KNEE ARTHROPLASTY Right 10/07/2013   Procedure: RIGHT TOTAL KNEE ARTHROPLASTY;  Surgeon: Kerin Salen, MD;  Location: [facility];  Service: Orthopedics;  Laterality: Right;  . TUBAL LIGATION      SOCIAL HISTORY:   Social History   Socioeconomic History  . Marital status: Married    Spouse name: Not on file  . Number of children: Not on file  . Years of education: Not on file  . Highest education level: Not on file  Social Needs  . Financial resource strain: Not on file  . Food insecurity - worry: Not on file  . Food insecurity - inability: Not on file  . Transportation needs - medical: Not on file  . Transportation needs - non-medical: Not on file  Occupational History  . Occupation: retired    Comment: "odd jobs"  Tobacco Use  . Smoking status: Never Smoker  . Smokeless tobacco: Never Used  Substance and Sexual Activity  . Alcohol use: Yes    Alcohol/week: 0.0 oz    Comment: rare  . Drug use: No  . Sexual activity: No    Birth control/protection: Surgical, Post-menopausal    Comment: 1st intercourse 83 yo-Fewer than 5 partners  Other Topics Concern  . Not on file  Social History Narrative  . Not on file    FAMILY HISTORY:   Family Status  Relation Name Status  . Son  Alive  . Son  Alive  . Son  Alive  . Mother  Deceased at age 67  . Father  Deceased at age 69  . Brother  Deceased  .  Neg Hx  (Not Specified)    ROS:  Review of Systems  Constitutional: Positive for malaise/fatigue.  HENT: Negative.   Respiratory: Negative.   Cardiovascular: Negative.   Gastrointestinal: Positive for constipation.  Genitourinary: Negative.   Musculoskeletal:  Negative.   Skin: Negative.   Neurological:       Admits to loss of balance, especially when stepping off a curb   Psychiatric/Behavioral: The patient is nervous/anxious.    PHYSICAL EXAMINATION:    VITALS:   Vitals:   12/21/16 1053  BP: 110/60  Pulse: 84  SpO2: 96%  Weight: 134 lb (60.8 kg)  Height: 5' 3.5" (1.613 m)    GEN:  The patient appears stated age and is in NAD. HEENT:  Normocephalic, atraumatic.  The mucous membranes are moist. The superficial temporal arteries are without ropiness or tenderness. CV:  RRR Lungs:  CTAB Neck/HEME:  There are no carotid bruits bilaterally.  Neurological examination:  Orientation: She is alert and oriented to person and place.  Has trouble with date. Montreal Cognitive Assessment  06/02/2016  Visuospatial/ Executive (0/5) 1  Naming (0/3) 3  Attention: Read list of digits (0/2) 2  Attention: Read list of letters (0/1) 1  Attention: Serial 7 subtraction starting at 100 (0/3) 3  Language: Repeat phrase (0/2) 2  Language : Fluency (0/1) 1  Abstraction (0/2) 1  Delayed Recall (0/5) 2  Orientation (0/6) 5  Total 21  Adjusted Score (based on education) 21   Cranial nerves: There is good facial symmetry. The visual fields are full to confrontational testing. The speech is fluent and clear. Soft palate rises symmetrically and there is no tongue deviation. Hearing is intact to conversational tone. Sensation: Sensation is intact to light touch throughout Motor: Strength is 5/5 in the upper and lower extremity.   Movement examination: Tone: There is normal tone in the upper and lower extremities, both on and off medication.  There is normal tone in the neck. Abnormal movements: None Coordination:  No decremation, with any form of RAMS, including alternating supination and pronation of the forearm, hand opening and closing, finger taps, heel taps and toe taps.   Gait and Station: she is able to arise without the use of the hands on the 2nd  attempt.  She walks well down the hall.  No shuffling.  Good arm swing.    ASSESSMENT/PLAN:  1.  Parkinson's disease by hx  -she was seen for levodopa challenge 09/16/16 and even off of medication she did not meet criteria for parkinsons disease.  She has been in a DaT study several years ago, but I explained that DaT is not a diagnostic test, nor is it sensitive or specific.  I do not think she has Parkinson's disease.  She (and particularly her husband) were thrown off by this and stated that she did worse off of the medication.  We placed her back on it and she is on carbidopa/levodopa 25/100:1.5/1/1.5/1 and carbidopa/levodopa 25/100 CR at night.  We discussed this again today.  They want to stay on the medication.  Asked me about medication timing.  Discussed r/b/se.  She is in RSB.  Discussed exercise and importance as she ages as long as its done safely   2.  Dementia  -Neurocognitive testing demonstrated mild dementia.  She tried Aricept in the past and felt that she did not tolerate it.  She only tried it for 2 days.  -increase exelon patch to 9.5 mg daily.  Risks, benefits, side effects  and alternative therapies were discussed.  The opportunity to ask questions was given and they were answered to the best of my ability.  The patient expressed understanding and willingness to follow the outlined treatment protocols.  -husband asked me about namenda today.  I would not add that right now  3.  Constipation  -recommend increase water intake.  Add bran, prunes.  Try miralax.  4.  Follow up is anticipated in the next 6 months, sooner should new neurologic issues arise.  Much greater than 50% of this visit was spent in counseling and coordinating care.  Total face to face time:  25 min   Cc:  Josetta Huddle, MD

## 2016-12-21 ENCOUNTER — Encounter: Payer: Self-pay | Admitting: Neurology

## 2016-12-21 ENCOUNTER — Ambulatory Visit (INDEPENDENT_AMBULATORY_CARE_PROVIDER_SITE_OTHER): Payer: BLUE CROSS/BLUE SHIELD | Admitting: Neurology

## 2016-12-21 VITALS — BP 110/60 | HR 84 | Ht 63.5 in | Wt 134.0 lb

## 2016-12-21 DIAGNOSIS — K5901 Slow transit constipation: Secondary | ICD-10-CM | POA: Diagnosis not present

## 2016-12-21 DIAGNOSIS — F0390 Unspecified dementia without behavioral disturbance: Secondary | ICD-10-CM | POA: Diagnosis not present

## 2016-12-21 MED ORDER — RIVASTIGMINE 9.5 MG/24HR TD PT24: 9.5000 mg | MEDICATED_PATCH | Freq: Every day | TRANSDERMAL | 1 refills | Status: DC

## 2016-12-26 DIAGNOSIS — N3000 Acute cystitis without hematuria: Secondary | ICD-10-CM | POA: Diagnosis not present

## 2017-01-03 ENCOUNTER — Encounter: Payer: BLUE CROSS/BLUE SHIELD | Admitting: Psychology

## 2017-01-11 DIAGNOSIS — K5900 Constipation, unspecified: Secondary | ICD-10-CM | POA: Diagnosis not present

## 2017-01-11 DIAGNOSIS — K219 Gastro-esophageal reflux disease without esophagitis: Secondary | ICD-10-CM | POA: Diagnosis not present

## 2017-01-11 DIAGNOSIS — R413 Other amnesia: Secondary | ICD-10-CM | POA: Diagnosis not present

## 2017-01-11 DIAGNOSIS — E039 Hypothyroidism, unspecified: Secondary | ICD-10-CM | POA: Diagnosis not present

## 2017-01-11 DIAGNOSIS — R5383 Other fatigue: Secondary | ICD-10-CM | POA: Diagnosis not present

## 2017-01-18 ENCOUNTER — Other Ambulatory Visit: Payer: Self-pay | Admitting: Sports Medicine

## 2017-01-23 ENCOUNTER — Other Ambulatory Visit: Payer: Self-pay | Admitting: *Deleted

## 2017-01-23 MED ORDER — DULOXETINE HCL 30 MG PO CPEP: 30.0000 mg | ORAL_CAPSULE | Freq: Two times a day (BID) | ORAL | 0 refills | Status: DC

## 2017-01-26 ENCOUNTER — Ambulatory Visit: Payer: BLUE CROSS/BLUE SHIELD | Admitting: Neurology

## 2017-02-22 DIAGNOSIS — N3281 Overactive bladder: Secondary | ICD-10-CM | POA: Diagnosis not present

## 2017-02-22 DIAGNOSIS — N952 Postmenopausal atrophic vaginitis: Secondary | ICD-10-CM | POA: Diagnosis not present

## 2017-02-22 DIAGNOSIS — N390 Urinary tract infection, site not specified: Secondary | ICD-10-CM | POA: Diagnosis not present

## 2017-02-22 DIAGNOSIS — G20 Parkinson's disease: Secondary | ICD-10-CM | POA: Diagnosis not present

## 2017-02-22 DIAGNOSIS — N3941 Urge incontinence: Secondary | ICD-10-CM | POA: Diagnosis not present

## 2017-02-28 ENCOUNTER — Ambulatory Visit: Payer: BLUE CROSS/BLUE SHIELD | Admitting: Neurology

## 2017-03-05 ENCOUNTER — Other Ambulatory Visit: Payer: Self-pay | Admitting: Neurology

## 2017-03-05 DIAGNOSIS — G20 Parkinson's disease: Secondary | ICD-10-CM

## 2017-03-07 DIAGNOSIS — E039 Hypothyroidism, unspecified: Secondary | ICD-10-CM | POA: Diagnosis not present

## 2017-03-07 DIAGNOSIS — Z0000 Encounter for general adult medical examination without abnormal findings: Secondary | ICD-10-CM | POA: Diagnosis not present

## 2017-03-07 DIAGNOSIS — I10 Essential (primary) hypertension: Secondary | ICD-10-CM | POA: Diagnosis not present

## 2017-03-17 ENCOUNTER — Other Ambulatory Visit: Payer: Self-pay | Admitting: Neurology

## 2017-03-17 DIAGNOSIS — G20 Parkinson's disease: Secondary | ICD-10-CM

## 2017-04-18 ENCOUNTER — Ambulatory Visit: Payer: BLUE CROSS/BLUE SHIELD | Admitting: Neurology

## 2017-04-19 DIAGNOSIS — N3941 Urge incontinence: Secondary | ICD-10-CM | POA: Diagnosis not present

## 2017-04-19 DIAGNOSIS — N952 Postmenopausal atrophic vaginitis: Secondary | ICD-10-CM | POA: Diagnosis not present

## 2017-04-19 DIAGNOSIS — N390 Urinary tract infection, site not specified: Secondary | ICD-10-CM | POA: Diagnosis not present

## 2017-04-19 DIAGNOSIS — G20 Parkinson's disease: Secondary | ICD-10-CM | POA: Diagnosis not present

## 2017-04-27 NOTE — Progress Notes (Signed)
Michelle Sims was seen today in the movement disorders clinic for neurologic consultation at the request of Josetta Huddle, MD.  The consultation is for the evaluation of PD.  The records that were made available to me were reviewed.  This patient is accompanied in the office by her spouse who supplements the history.  Pt has previously seen Dr. Erling Cruz, Dr. Maxine Glenn and Dr. Rexene Alberts.  She last saw Dr. Rexene Alberts on 02/22/16.  She was first seen by Dr. love.  I do not have his records.  She became part of a research trial for DaT scan at [facility] at the end of 2013.  She did have a DaT scan on 12/27/2012 that demonstrated absent uptake in the bilateral putamen.  Also had one in 5/20104 with similar findings (reduced uptake).  She was placed on ropinirole in December, 2013 but did not tolerate the medication.  In early, 2014 she was placed on levodopa.  Dr. Guadelupe Sabin notes indicate that she has tried to work her up over time to carbidopa/levodopa 25/100, 1-1/2 tablets 4 times per day, but the patient really could not tolerate this so last visit she told her to take carbidopa/levodopa 25/100 on the following schedule: 1.5 at 6:30-7am/1at 11am/1.5at 3pm/1 at 7pm and then she added carbidopa/levodopa 25/100 CR at bedtime (10pm).  Pt states she isn't sure why bedtime dose was added.  She doesn't note wearing off of med.  She isn't sure that med helps at all or what it helps with.  Records indicate patient has been active with [employer] programs for PD and she is still involved with these programs.   She was involved with water aerobics but had to be out of the water for a squamous cell CA removal.     Specific Symptoms:  Tremor: No. Family hx of similar:  No. Voice: some hoarseness Sleep: some trouble both getting to and staying asleep  Vivid Dreams:  No.  Acting out dreams:  Yes.   (rarely will scream out) Wet Pillows: No. Postural symptoms:  Yes.    Falls?  No. Bradykinesia symptoms: slow movements and  difficulty getting out of a chair Loss of smell:  Yes.   Loss of taste:  No. Urinary Incontinence:  No. (wears panty liner just in case) Difficulty Swallowing:  No. Handwriting, micrographia: unknown Trouble with ADL's:  No.  Trouble buttoning clothing: No. Depression:  Yes.   (just gave up driving and that was stressful) Memory changes:  Yes.   Hallucinations:  Yes.  , rare and lasts few seconds and knows not real (sees person)  visual distortions: Yes.   N/V:  No. Lightheaded:  No.  Syncope: No. Diplopia:  No. Dyskinesia:  No.  09/16/16 update:  Patient seen today in today in follow-up for her Parkinson's, accompanied by her husband who supplements the history.  They are here for her levodopa challenge.  She last took levodopa at 9:30 pm and that was the CR.  She states that she feels tired but isn't sure that she feels much different physically.  She is usually on carbidopa/levodopa 25/100:1.5/1/1.5/1 and carbidopa/levodopa 25/100 CR at night.  Her primary care physician did start her on donepezil since our last visit.  She took it twice and "it didn't agree with her."  Her husband states that she had insomnia with it and she felt foggy headed with it.    She has been back to see Dr. Gershon Crane twice since our last visit.  I  have reviewed those records.  She has had dry eye, which has contributed to some blurry vision.  Having more confusion.  States that PCP added amlodipine since our last visit.  11/04/16 update:  Patient seen in follow-up for her memory change.  We did a levodopa challenge test last visit and it was my recommendation that we wean her off of levodopa.  Right after our visit, the patient's husband called [facility] and wanted to be seen at their office.  They suggested that the patient make a choice of neurologists, given that she has seen multiple neurologists in the past.  They called me after attempting to wean the medication and felt that she was doing worse, so she  went back on it.  She is on carbidopa/levodopa 25/100:1.5/1/1.5/1 and carbidopa/levodopa 25/100 CR at night.   Patient was seen on 09/22/2016 by Dr. Si Raider and had neurocognitive testing.  Patients testing was reviewed in detail with the patient and her husband.  Testing demonstrated mild dementia.  Acetylcholinesterase inhibitors were recommended.  She had tried Aricept in the past and did not tolerate it.  She only tried it for 2 days.  C/o poor energy and "grogginess."  When asked when on klonopin, only 1/4 of tablet, states that "I have done it so long that it was felt I should continue."  Doing RSB and water aerobics.    12/21/16 update: Patient was seen as a work in today.  She is accompanied by her husband who supplements the history.  She is on carbidopa/levodopa 25/100: 1.5 tablets / 1 tablet/1.5 tablets / 1 tablet and carbidopa/levodopa 25/100 CR at bedtime.  Husband asks me about taking all CR at night.  No falls.   She was started on the Exelon patch last visit.  She is on 4.6 mg.  She has had no side effects with that.  No site reactions.  Rotating sites.  Ask me to change that to 90 day RX.  Her husband cannot understand why she is confused about dates.  Asks about constipation  04/28/17 update: Patient seen back today.  She has a reported history of Parkinson's disease, although examination off medicine did not confirm this.  Regardless, the patient went back on medication as she felt better on it.  She is on carbidopa/levodopa 25/100, 1.5 tablets / 1 tablet/1.5 tablets / 1 tablet and carbidopa/levodopa 25/100 CR at bedtime.  She is doing RSB 2 days per week.  She does water aerobics on the opposite today.  She had one fall off chair at lunch.  Husband states that no LOC and patient agrees (pt calls it a passing out episode).  No recurrence.   Exelon patch was increased last visit to 9.5 mg daily. No SE.  No significant site reaction but they will note a little redness in the area when first takes  off. Records are reviewed since last visit.  She has seen urology, most recently on April 19, 2017.  She is on Myrbetriq, which has been helpful for her for urgency.  PREVIOUS MEDICATIONS: Sinemet and Requip, aricept  ALLERGIES:   Allergies  Allergen Reactions  . Sulfa Antibiotics Rash  . Biaxin [Clarithromycin] Other (See Comments)    Pt does not remember reaction   . Ciprofloxacin Nausea And Vomiting  . Hctz [Hydrochlorothiazide] Other (See Comments)    weakness and hyponatremia  . Macrobid [Nitrofurantoin] Nausea Only  . Other Nausea Only  . Prednisone Other (See Comments)    Swelling and facial redness   .  Sulfamethoxazole-Trimethoprim Other (See Comments) and Rash    Sore throat    CURRENT MEDICATIONS:  Outpatient Encounter Medications as of 04/28/2017  Medication Sig  . amLODipine (NORVASC) 2.5 MG tablet Take 2.5 mg by mouth daily.  . Ascorbic Acid (VITAMIN C PO) Take by mouth.  . Calcium Carb-Cholecalciferol (CALCIUM 600 + D PO) Take 1 tablet by mouth daily with lunch.  . carbidopa-levodopa (SINEMET IR) 25-100 MG tablet 1.5 in the morning, 1 mid morning, 1.5 afternoon, 1 evening  . Carbidopa-Levodopa ER (SINEMET CR) 25-100 MG tablet controlled release TAKE 1 TABLET BY MOUTH EVERYDAY AT BEDTIME  . cholecalciferol (VITAMIN D) 1000 units tablet Take 2,000 Units by mouth daily.  Marland Kitchen co-enzyme Q-10 30 MG capsule Take 30 mg by mouth 3 (three) times daily.  . Cranberry (SM CRANBERRY) 300 MG tablet Take 300 mg by mouth daily.  . DULoxetine (CYMBALTA) 30 MG capsule Take 1 capsule (30 mg total) by mouth 2 (two) times daily.  . irbesartan (AVAPRO) 75 MG tablet Take 37.5 mg by mouth at bedtime.   Marland Kitchen levothyroxine (SYNTHROID, LEVOTHROID) 112 MCG tablet Take 112 mcg by mouth daily before breakfast.  . MYRBETRIQ 25 MG TB24 tablet Take 25 mg by mouth daily.  . NONFORMULARY OR COMPOUNDED ITEM Estradiol 0.02 % (1 syringe) into the vagina at bedtime 2 times a week as directed.  . raloxifene  (EVISTA) 60 MG tablet TAKE 1 TABLET (60 MG TOTAL) BY MOUTH DAILY.  . rivastigmine (EXELON) 9.5 mg/24hr Place 1 patch (9.5 mg total) onto the skin daily.   No facility-administered encounter medications on file as of 04/28/2017.     PAST MEDICAL HISTORY:   Past Medical History:  Diagnosis Date  . Anxiety   . Arthritis    knee & back   . Blood type, Rh negative   . Breast cancer ([place])   . Chronic fatigue   . Depression   . Diverticulosis   . Endocervical polyp   . GERD (gastroesophageal reflux disease)   . Hypertension   . Hypothyroidism   . IBS (irritable bowel syndrome)   . Insomnia   . Lymphedema   . Neuromuscular disorder ([place])    Parkinson's disease   . Osteopenia 06/2015   T score -2.4 distal third of the radius  . Parkinson disease ([place])   . RBBB 10/07/2013  . Thyroid cancer ([place])     PAST SURGICAL HISTORY:   Past Surgical History:  Procedure Laterality Date  . APPENDECTOMY  1979  . BREAST SURGERY  1979   MASTECTOMY AND RECONSTRUCTION  . DILATION AND CURETTAGE OF UTERUS     MISCARRIAGE  . EYE SURGERY Bilateral    w/ IOL  . HYSTEROSCOPY  [id number]   AND D&C  . MASTECTOMY Bilateral   . THYROIDECTOMY    . TONSILLECTOMY    . TOTAL KNEE ARTHROPLASTY Right 10/07/2013   Procedure: RIGHT TOTAL KNEE ARTHROPLASTY;  Surgeon: Kerin Salen, MD;  Location: [facility];  Service: Orthopedics;  Laterality: Right;  . TUBAL LIGATION      SOCIAL HISTORY:   Social History   Socioeconomic History  . Marital status: Married    Spouse name: Not on file  . Number of children: Not on file  . Years of education: Not on file  . Highest education level: Not on file  Occupational History  . Occupation: retired    Comment: "odd jobs"  Social Needs  . Financial resource strain: Not on file  . Food insecurity:  Worry: Not on file    Inability: Not on file  . Transportation needs:    Medical: Not on file    Non-medical: Not on file  Tobacco Use  . Smoking status: Never Smoker    . Smokeless tobacco: Never Used  Substance and Sexual Activity  . Alcohol use: Yes    Alcohol/week: 0.0 oz    Comment: rare  . Drug use: No  . Sexual activity: Never    Birth control/protection: Surgical, Post-menopausal    Comment: 1st intercourse 84 yo-Fewer than 5 partners  Lifestyle  . Physical activity:    Days per week: Not on file    Minutes per session: Not on file  . Stress: Not on file  Relationships  . Social connections:    Talks on phone: Not on file    Gets together: Not on file    Attends religious service: Not on file    Active member of club or organization: Not on file    Attends meetings of clubs or organizations: Not on file    Relationship status: Not on file  . Intimate partner violence:    Fear of current or ex partner: Not on file    Emotionally abused: Not on file    Physically abused: Not on file    Forced sexual activity: Not on file  Other Topics Concern  . Not on file  Social History Narrative  . Not on file    FAMILY HISTORY:   Family Status  Relation Name Status  . Son  Alive  . Son  Alive  . Son  Alive  . Mother  Deceased at age 7  . Father  Deceased at age 63  . Brother  Deceased  . Neg Hx  (Not Specified)    ROS:  Review of Systems  Constitutional: Positive for malaise/fatigue.  HENT: Negative.   Respiratory: Negative.   Cardiovascular: Negative.   Gastrointestinal: Positive for constipation.  Genitourinary: Negative.   Musculoskeletal: Negative.   Skin: Negative.   Neurological:       Not steady   Endo/Heme/Allergies: Negative.   Psychiatric/Behavioral: The patient is nervous/anxious.    PHYSICAL EXAMINATION:    VITALS:   Vitals:   04/28/17 1108  BP: 120/80  Pulse: 100  SpO2: 97%  Weight: 136 lb (61.7 kg)  Height: 5\' 3"  (1.6 m)    GEN:  The patient appears stated age and is in NAD. HEENT:  Normocephalic, atraumatic.  The mucous membranes are moist. The superficial temporal arteries are without ropiness or  tenderness. CV:  RRR Lungs:  CTAB Neck/HEME:  There are no carotid bruits bilaterally.  Neurological examination:  Orientation: She is alert and oriented to person and place.  Has trouble with date. Montreal Cognitive Assessment  06/02/2016  Visuospatial/ Executive (0/5) 1  Naming (0/3) 3  Attention: Read list of digits (0/2) 2  Attention: Read list of letters (0/1) 1  Attention: Serial 7 subtraction starting at 100 (0/3) 3  Language: Repeat phrase (0/2) 2  Language : Fluency (0/1) 1  Abstraction (0/2) 1  Delayed Recall (0/5) 2  Orientation (0/6) 5  Total 21  Adjusted Score (based on education) 21   Cranial nerves: There is good facial symmetry.  Some facial hypomimia.  The visual fields are full to confrontational testing. The speech is fluent and clear. Soft palate rises symmetrically and there is no tongue deviation. Hearing is intact to conversational tone. Sensation: Sensation is intact to light touch throughout Motor:  Strength is 5/5 in the upper and lower extremity.   Movement examination: Tone: There is normal tone in the upper and lower extremities. Abnormal movements: None Coordination:  Minor decremation with finger taps on the left.   Gait and Station: she pushes off of the chair.  She drags the right leg.  Has some trouble with turning.    ASSESSMENT/PLAN:  1.  Parkinson's disease by hx  -she was seen for levodopa challenge 09/16/16 and even off of medication she did not meet criteria for parkinsons disease.  She has been in a DaT study several years ago, but I explained that DaT is not a diagnostic test, nor is it sensitive or specific.  I do not think she has Parkinson's disease.  She (and particularly her husband) were thrown off by this and stated that she did worse off of the medication.  We placed her back on it and she is on carbidopa/levodopa 25/100:1.5/1/1.5/1 and carbidopa/levodopa 25/100 CR at night.  They asked me again about diagnostic criteria for PD.   Told them that this is semantics if she feels better on the medication than off of it and wants to remain on it.  We discussed this again today.  They want to stay on the medication.  Asked me about medication timing.  Discussed r/b/se.  She is in RSB.    -needs PT.  Refer to the neurorehab center   2.  Dementia  -Neurocognitive testing demonstrated mild dementia.  She tried Aricept in the past and felt that she did not tolerate it.  She only tried it for 2 days.  -continue exelon patch 9.5 mg daily.  Risks, benefits, side effects and alternative therapies were discussed.  The opportunity to ask questions was given and they were answered to the best of my ability.  The patient expressed understanding and willingness to follow the outlined treatment protocols.  -try flonase on skin before apply patch  3.  Constipation  -recommend increase water intake.  Add bran, prunes.  Try miralax.  4.  Fatigue  -wonder if related to her BP meds.  avapro was increased back to 75 mg.  On avapro and amlodipine.  Keep BP logs and let us know.  5.  Follow up is anticipated in the next few months, sooner should new neurologic issues arise.  Much greater than 50% of this visit was spent in counseling and coordinating care.  Total face to face time:  30 min   Cc:  Josetta Huddle, MD

## 2017-04-28 ENCOUNTER — Encounter: Payer: Self-pay | Admitting: Neurology

## 2017-04-28 ENCOUNTER — Ambulatory Visit (INDEPENDENT_AMBULATORY_CARE_PROVIDER_SITE_OTHER): Payer: BLUE CROSS/BLUE SHIELD | Admitting: Neurology

## 2017-04-28 VITALS — BP 120/80 | HR 100 | Ht 63.0 in | Wt 136.0 lb

## 2017-04-28 DIAGNOSIS — G20 Parkinson's disease: Secondary | ICD-10-CM | POA: Diagnosis not present

## 2017-04-28 DIAGNOSIS — R5383 Other fatigue: Secondary | ICD-10-CM | POA: Diagnosis not present

## 2017-04-28 DIAGNOSIS — F0390 Unspecified dementia without behavioral disturbance: Secondary | ICD-10-CM

## 2017-04-28 NOTE — Patient Instructions (Addendum)
1. You have been referred to [facility] for physical therapy. They will call you directly to schedule an appointment.  Please call [phone] if you do not hear from them.   2. Spray flonase on skin prior to using exelon patch.

## 2017-05-15 DIAGNOSIS — Z9181 History of falling: Secondary | ICD-10-CM | POA: Diagnosis not present

## 2017-05-15 DIAGNOSIS — E611 Iron deficiency: Secondary | ICD-10-CM | POA: Diagnosis not present

## 2017-05-15 DIAGNOSIS — R5381 Other malaise: Secondary | ICD-10-CM | POA: Diagnosis not present

## 2017-05-15 DIAGNOSIS — E538 Deficiency of other specified B group vitamins: Secondary | ICD-10-CM | POA: Diagnosis not present

## 2017-05-15 DIAGNOSIS — R269 Unspecified abnormalities of gait and mobility: Secondary | ICD-10-CM | POA: Diagnosis not present

## 2017-05-15 DIAGNOSIS — G232 Striatonigral degeneration: Secondary | ICD-10-CM | POA: Diagnosis not present

## 2017-05-15 DIAGNOSIS — M5020 Other cervical disc displacement, unspecified cervical region: Secondary | ICD-10-CM | POA: Diagnosis not present

## 2017-05-15 DIAGNOSIS — F09 Unspecified mental disorder due to known physiological condition: Secondary | ICD-10-CM | POA: Diagnosis not present

## 2017-05-20 DIAGNOSIS — R269 Unspecified abnormalities of gait and mobility: Secondary | ICD-10-CM | POA: Diagnosis not present

## 2017-05-20 DIAGNOSIS — G319 Degenerative disease of nervous system, unspecified: Secondary | ICD-10-CM | POA: Diagnosis not present

## 2017-05-20 DIAGNOSIS — G232 Striatonigral degeneration: Secondary | ICD-10-CM | POA: Diagnosis not present

## 2017-05-29 DIAGNOSIS — I10 Essential (primary) hypertension: Secondary | ICD-10-CM | POA: Diagnosis not present

## 2017-05-29 DIAGNOSIS — R42 Dizziness and giddiness: Secondary | ICD-10-CM | POA: Diagnosis not present

## 2017-06-02 ENCOUNTER — Ambulatory Visit: Payer: Medicare Other | Admitting: Physical Therapy

## 2017-06-05 DIAGNOSIS — Z9181 History of falling: Secondary | ICD-10-CM | POA: Diagnosis not present

## 2017-06-05 DIAGNOSIS — M5020 Other cervical disc displacement, unspecified cervical region: Secondary | ICD-10-CM | POA: Diagnosis not present

## 2017-06-05 DIAGNOSIS — R269 Unspecified abnormalities of gait and mobility: Secondary | ICD-10-CM | POA: Diagnosis not present

## 2017-06-05 DIAGNOSIS — F09 Unspecified mental disorder due to known physiological condition: Secondary | ICD-10-CM | POA: Diagnosis not present

## 2017-06-07 ENCOUNTER — Other Ambulatory Visit: Payer: Self-pay | Admitting: Neurology

## 2017-06-07 DIAGNOSIS — G20 Parkinson's disease: Secondary | ICD-10-CM

## 2017-06-08 DIAGNOSIS — B9620 Unspecified Escherichia coli [E. coli] as the cause of diseases classified elsewhere: Secondary | ICD-10-CM | POA: Diagnosis not present

## 2017-06-08 DIAGNOSIS — N390 Urinary tract infection, site not specified: Secondary | ICD-10-CM | POA: Diagnosis not present

## 2017-06-09 ENCOUNTER — Encounter

## 2017-06-09 ENCOUNTER — Ambulatory Visit: Payer: Medicare Other | Admitting: Physical Therapy

## 2017-06-13 ENCOUNTER — Encounter: Payer: Self-pay | Admitting: Physical Therapy

## 2017-06-13 ENCOUNTER — Ambulatory Visit: Payer: BLUE CROSS/BLUE SHIELD | Attending: Internal Medicine | Admitting: Physical Therapy

## 2017-06-13 DIAGNOSIS — R2689 Other abnormalities of gait and mobility: Secondary | ICD-10-CM | POA: Diagnosis not present

## 2017-06-13 DIAGNOSIS — R293 Abnormal posture: Secondary | ICD-10-CM

## 2017-06-13 DIAGNOSIS — M6281 Muscle weakness (generalized): Secondary | ICD-10-CM

## 2017-06-13 DIAGNOSIS — R2681 Unsteadiness on feet: Secondary | ICD-10-CM | POA: Diagnosis not present

## 2017-06-14 ENCOUNTER — Other Ambulatory Visit: Payer: Self-pay | Admitting: Neurology

## 2017-06-14 NOTE — Therapy (Signed)
[facility] [address] [place], [place], [address] Phone: [phone]   Fax:  [phone]  Physical Therapy Evaluation  Patient Details  Name: Michelle Sims MRN: [id number] Date of Birth: 1929/06/10 Referring Provider: Alonza Bogus   Encounter Date: 06/13/2017  PT End of Session - 06/13/17 2155    Visit Number  1    Number of Visits  17    Date for PT Re-Evaluation  09/11/17    Authorization Type  BCBS, Medicare    PT Start Time  1019    PT Stop Time  1109    PT Time Calculation (min)  50 min    Equipment Utilized During Treatment  Gait belt    Activity Tolerance  Patient tolerated treatment well    Behavior During Therapy  [facility] for tasks assessed/performed       Past Medical History:  Diagnosis Date  . Anxiety   . Arthritis    knee & back   . Blood type, Rh negative   . Breast cancer ([place])   . Chronic fatigue   . Depression   . Diverticulosis   . Endocervical polyp   . GERD (gastroesophageal reflux disease)   . Hypertension   . Hypothyroidism   . IBS (irritable bowel syndrome)   . Insomnia   . Lymphedema   . Neuromuscular disorder ([place])    Parkinson's disease   . Osteopenia 06/2015   T score -2.4 distal third of the radius  . Parkinson disease ([place])   . RBBB 10/07/2013  . Thyroid cancer [facility])     Past Surgical History:  Procedure Laterality Date  . APPENDECTOMY  1979  . BREAST SURGERY  1979   MASTECTOMY AND RECONSTRUCTION  . DILATION AND CURETTAGE OF UTERUS     MISCARRIAGE  . EYE SURGERY Bilateral    w/ IOL  . HYSTEROSCOPY  [id number]   AND D&C  . MASTECTOMY Bilateral   . THYROIDECTOMY    . TONSILLECTOMY    . TOTAL KNEE ARTHROPLASTY Right 10/07/2013   Procedure: RIGHT TOTAL KNEE ARTHROPLASTY;  Surgeon: Kerin Salen, MD;  Location: [facility];  Service: Orthopedics;  Laterality: Right;  . TUBAL LIGATION      There were no vitals filed for this visit.   Subjective Assessment - 06/13/17  1022    Subjective  Pt and husband present for eval-husband reports needs balance and mobility work.  Pt feels balance and mobility are worsening.  Reports having 2-3 (near) falls in the past year.  One was sliding out of the chair in the dining room.  HAs cane and walker, but does not use. "[name]"    Patient is accompained by:  Family member wife    Pertinent History  anxiety, arthritis, breast ca, depression, diverticulosis, GERD, HTN, IBS, osteopenia, R TKR; per Dr. Doristine Devoid notes-dementia    Patient Stated Goals  Pt's goals for therapy for mobility and balance.    Currently in Pain?  No/denies         [facility] PT Assessment - 06/13/17 1026      Assessment   Medical Diagnosis  Parkinson's disease    Referring Provider  Tat, Wells Guiles    Onset Date/Surgical Date  04/28/17      Precautions   Precautions  Fall      Balance Screen   Has the patient fallen in the past 6 months  Yes    How many times?  2    Has the  patient had a decrease in activity level because of a fear of falling?   Yes    Is the patient reluctant to leave their home because of a fear of falling?   Yes      [place] - single point;Walker - 2 wheels    Additional Comments  Goes to dining room for dinner; lunch and breakfast in apartment      Prior Function   Level of Independence  Independent;Independent with household mobility without device    Leisure  Goes to [employer] 2x/wk, [employer] and exercise class at [employer]      Observation/Other Assessments   Focus on Therapeutic Outcomes (FOTO)   NA      Posture/Postural Control   Posture/Postural Control  Postural limitations    Postural Limitations  Rounded Shoulders;Forward head      ROM / Strength   AROM / PROM / Strength  Strength      Strength   Overall Strength Comments  Grossly tested at least 4/5 throughout bilateral lower extremities      Transfers   Transfers  Sit  to Stand;Stand to Sit    Sit to Stand  6: Modified independent (Device/Increase time);Without upper extremity assist;From chair/3-in-1    Five time sit to stand comments   10.59    Stand to Sit  6: Modified independent (Device/Increase time);Without upper extremity assist;To chair/3-in-1    Comments  Does not fully come up to stand; with untested sit<>stand trial, she has posterior LOB      Ambulation/Gait   Ambulation/Gait  Yes    Ambulation/Gait Assistance  5: Supervision;4: Min guard    Ambulation/Gait Assistance Details  Has 2 episodes of R foot catching, 1 episode of L foot catching with gait during eval    Ambulation Distance (Feet)  200 Feet    Assistive device  None    Gait Pattern  Step-through pattern;Decreased step length - right;Decreased step length - left;Decreased dorsiflexion - right;Decreased dorsiflexion - left;Narrow base of support;Poor foot clearance - left;Poor foot clearance - right    Ambulation Surface  Level;Indoor    Gait velocity  10.94 sec = 3 ft/sec 11.15 sec = 2.94 ft/sec (2nd trial)    Stairs  Yes    Stair Management Technique  Two rails;Alternating pattern;Step to pattern Alternating ascending; step-to descending    Number of Stairs  4    Height of Stairs  6    Curb  4: Min assist    Curb Details (indicate cue type and reason)  Pt requires therapist HHA, and slowed pace to descend curb due to fearful of falling.      Standardized Balance Assessment   Standardized Balance Assessment  Timed Up and Go Test      Timed Up and Go Test   Normal TUG (seconds)  17.59    Manual TUG (seconds)  16.69    Cognitive TUG (seconds)  16.88 did not count out loud    TUG Comments  Scores >13.5-15 sec indicate increased fall risk.      High Level Balance   High Level Balance Comments  MiniBESTest score:  7/28 (See full note for details)          Mini-BESTest: Balance Evaluation Systems Test  2005-2013 [facility]. All rights  reserved. ________________________________________________________________________________________Anticipatory_________Subscore__2___/6 1. SIT TO STAND Instruction: "Cross your arms across your chest. Try not to  use your hands unless you must.Do not let your legs lean against the back of the chair when you stand. Please stand up now." X(2) Normal: Comes to stand without use of hands and stabilizes independently. (1) Moderate: Comes to stand WITH use of hands on first attempt. (0) Severe: Unable to stand up from chair without assistance, OR needs several attempts with use of hands. 2. RISE TO TOES Instruction: "Place your feet shoulder width apart. Place your hands on your hips. Try to rise as high as you can onto your toes. I will count out loud to 3 seconds. Try to hold this pose for at least 3 seconds. Look straight ahead. Rise now." (2) Normal: Stable for 3 s with maximum height. (1) Moderate: Heels up, but not full range (smaller than when holding hands), OR noticeable instability for 3 s. X(0) Severe: < 3 s. 3. STAND ON ONE LEG Instruction: "Look straight ahead. Keep your hands on your hips. Lift your leg off of the ground behind you without touching or resting your raised leg upon your other standing leg. Stay standing on one leg as long as you can. Look straight ahead. Lift now." Left: Time in Seconds Trial 1:_____Trial 2:_____ (2) Normal: 20 s. (1) Moderate: < 20 s. X(0) Severe: Unable. Right: Time in Seconds Trial 1:_____Trial 2:_____ (2) Normal: 20 s. (1) Moderate: < 20 s. X(0) Severe: Unable To score each side separately use the trial with the longest time. To calculate the sub-score and total score use the side [left or right] with the lowest numerical score [i.e. the worse side]. ______________________________________________________________________________________Reactive Postural Control___________Subscore:___0__/6 4. COMPENSATORY STEPPING CORRECTION- FORWARD Instruction:  "Stand with your feet shoulder width apart, arms at your sides. Lean forward against my hands beyond your forward limits. When I let go, do whatever is necessary, including taking a step, to avoid a fall." (2) Normal: Recovers independently with a single, large step (second realignment step is allowed). (1) Moderate: More than one step used to recover equilibrium. X(0) Severe: No step, OR would fall if not caught, OR falls spontaneously. 5. COMPENSATORY STEPPING CORRECTION- BACKWARD Instruction: "Stand with your feet shoulder width apart, arms at your sides. Lean backward against my hands beyond your backward limits. When I let go, do whatever is necessary, including taking a step, to avoid a fall." (2) Normal: Recovers independently with a single, large step. (1) Moderate: More than one step used to recover equilibrium. X(0) Severe: No step, OR would fall if not caught, OR falls spontaneously. 6. COMPENSATORY STEPPING CORRECTION- LATERAL Instruction: "Stand with your feet together, arms down at your sides. Lean into my hand beyond your sideways limit. When I let go, do whatever is necessary, including taking a step, to avoid a fall." Left (2) Normal: Recovers independently with 1 step (crossover or lateral OK). (1) Moderate: Several steps to recover equilibrium. X(0) Severe: Falls, or cannot step. Right (2) Normal: Recovers independently with 1 step (crossover or lateral OK). (1) Moderate: Several steps to recover equilibrium. X(0) Severe: Falls, or cannot step. Use the side with the lowest score to calculate sub-score and total score. ____________________________________________________________________________________Sensory Orientation_____________Subscore:______3___/6 7. STANCE (FEET TOGETHER); EYES OPEN, FIRM SURFACE Instruction: "Place your hands on your hips. Place your feet together until almost touching. Look straight ahead. Be as stable and still as possible, until I say  stop." Time in seconds:________ X(2) Normal: 30 s. (1) Moderate: < 30 s. (0) Severe: Unable. 8. STANCE (FEET TOGETHER); EYES CLOSED, FOAM SURFACE Instruction: "Step onto the foam.  Place your hands on your hips. Place your feet together until almost touching. Be as stable and still as possible, until I say stop. I will start timing when you close your eyes." Time in seconds:________ (2) Normal: 30 s. (1) Moderate: < 30 s. X(0) Severe: Unable. 9. INCLINE- EYES CLOSED Instruction: "Step onto the incline ramp. Please stand on the incline ramp with your toes toward the top. Place your feet shoulder width apart and have your arms down at your sides. I will start timing when you close your eyes." Time in seconds:____5 sec____ (2) Normal: Stands independently 30 s and aligns with gravity. X(1) Moderate: Stands independently <30 s OR aligns with surface. (0) Severe: Unable. _________________________________________________________________________________________Dynamic Gait ______Subscore______2__/10 10. CHANGE IN GAIT SPEED Instruction: "Begin walking at your normal speed, when I tell you 'fast', walk as fast as you can. When I say 'slow', walk very slowly." (2) Normal: Significantly changes walking speed without imbalance. (1) Moderate: Unable to change walking speed or signs of imbalance. X(0) Severe: Unable to achieve significant change in walking speed AND signs of imbalance. [place] - HORIZONTAL Instruction: "Begin walking at your normal speed, when I say "right", turn your head and look to the right. When I say "left" turn your head and look to the left. Try to keep yourself walking in a straight line." (2) Normal: performs head turns with no change in gait speed and good balance. X(1) Moderate: performs head turns with reduction in gait speed. (0) Severe: performs head turns with imbalance. 12. WALK WITH PIVOT TURNS Instruction: "Begin walking at your normal speed.  When I tell you to 'turn and stop', turn as quickly as you can, face the opposite direction, and stop. After the turn, your feet should be close together." (2) Normal: Turns with feet close FAST (< 3 steps) with good balance. X(1) Moderate: Turns with feet close SLOW (>4 steps) with good balance. (0) Severe: Cannot turn with feet close at any speed without imbalance. 13. STEP OVER OBSTACLES Instruction: "Begin walking at your normal speed. When you get to the box, step over it, not around it and keep walking." (2) Normal: Able to step over box with minimal change of gait speed and with good balance. (1) Moderate: Steps over box but touches box OR displays cautious behavior by slowing gait. X(0) Severe: Unable to step over box OR steps around box.-Significant hesitation holding PT's hand 14. TIMED UP & GO WITH DUAL TASK [3 METER WALK] Instruction TUG: "When I say 'Go', stand up from chair, walk at your normal speed across the tape on the floor, turn around, and come back to sit in the chair." Instruction TUG with Dual Task: "Count backwards by threes starting at ___. When I say 'Go', stand up from chair, walk at your normal speed across the tape on the floor, turn around, and come back to sit in the chair. Continue counting backwards the entire time." TUG: ________seconds; Dual Task TUG: ________seconds (2) Normal: No noticeable change in sitting, standing or walking while backward counting when compared to TUG without Dual Task. (1) Moderate: Dual Task affects either counting OR walking (>10%) when compared to the TUG without Dual Task. X(0) Severe: Stops counting while walking OR stops walking while counting.-Does not count out loud at all When scoring item 14, if subject's gait speed slows more than 10% between the TUG without and with a Dual Task the score should be decreased by a point. TOTAL SCORE: ______7__/28  Objective measurements completed on examination: See above  findings.              PT Education - 06/13/17 2205    Education provided  Yes    Education Details  Results of objective measures, including fall risk per MiniBESTest and TUG scores; discussed possible decreased vestibular system use for balance and possible use of assistive device to help build confidence with gait    Person(s) Educated  Patient;Spouse    Methods  Explanation;Demonstration    Comprehension  Verbalized understanding       PT Short Term Goals - 06/13/17 2206      PT SHORT TERM GOAL #1   Title  Pt will perform HEP with husband's supervision for improved balance and gait.    Time  4    Period  Weeks    Status  New    Target Date  07/07/17      PT SHORT TERM GOAL #2   Title  Pt will improve TUG score to less than or equal to 15 seconds for decreased fall risk.    Time  4    Period  Weeks    Status  New    Target Date  07/07/17      PT SHORT TERM GOAL #3   Title  Pt will improve postural control, as evidenced by ability to regain posterior balance in posterior push and release test, in 2 steps or less.    Time  4    Period  Weeks    Status  New    Target Date  07/07/17      PT SHORT TERM GOAL #4   Title  Pt/husband will verbalize understanding of fall prevention in home environment.    Time  4    Period  Weeks    Status  New    Target Date  07/07/17        PT Long Term Goals - 06/14/17 0527      PT LONG TERM GOAL #1   Title  Pt and husband will verbalize continued community fitness, including updated HEP for continued targeted work on balance and gait.  TARGET 08/04/17    Time  8    Period  Weeks    Status  New    Target Date  08/04/17      PT LONG TERM GOAL #2   Title  Pt will improve TUG score to less than or equal to 13.5 sec for decreased fall risk.    Time  8    Period  Weeks    Status  New    Target Date  08/04/17      PT LONG TERM GOAL #3   Title  Pt will improve MiniBESTest score to at least 14/28 for decreased fall risk.     Time  8    Period  Weeks    Status  New    Target Date  08/04/17      PT LONG TERM GOAL #4   Title  Pt will ambulate at least 1000 ft over indoor/outdoor surfaces, with supervision and appropriate assistive device, for improved community gait.    Time  8    Period  Weeks    Status  New    Target Date  08/04/17             Plan - 06/13/17 2156    Clinical Impression Statement  Pt is an 84 year old female who presnetes to  OP PT with Parkinson's disease with history of falls and worsening balance, decreased confidence with gait.  Pt presents with decreased balance, likely decreased vestibular system for balance (due to inability to close eyes on compliant or incline surface), unsteady gait, decreased timing and coordination with gait, abnormal posture, decreased postural stability.  Pt is at fall risk per TUG, TUG cognitive, and MiniBESTest scores.  Pt will benefit from skilled PT to address the above stated deficits for decreased fall risk and improved functional mobility.    History and Personal Factors relevant to plan of care:  PMH includes >3 co-morbidities, hx of falls, hx of mild dementia    Clinical Presentation  Stable    Clinical Presentation due to:  fall risk per MiniBESTest and TUG scores    Clinical Decision Making  Low    Rehab Potential  Good    Clinical Impairments Affecting Rehab Potential  hx of mild dementia; husband present for eval and appears supportive    PT Frequency  2x / week    PT Duration  8 weeks includes eval week    PT Treatment/Interventions  ADLs/Self Care Home Management;DME Instruction;Balance training;Therapeutic exercise;Therapeutic activities;Functional mobility training;Stair training;Gait training;Neuromuscular re-education;Patient/family education    PT Next Visit Plan  Initiate HEP for functional strength and for standing balance.  Gait training with possible assistive device-cane versus walker (for improved confidence with longer distance  walking)    Consulted and Agree with Plan of Care  Patient;Family member/caregiver    Family Member Consulted  Husband       Patient will benefit from skilled therapeutic intervention in order to improve the following deficits and impairments:  Abnormal gait, Decreased balance, Decreased safety awareness, Decreased strength, Difficulty walking, Decreased mobility, Postural dysfunction  Visit Diagnosis: Other abnormalities of gait and mobility  Unsteadiness on feet  Abnormal posture  Muscle weakness (generalized)     Problem List Patient Active Problem List   Diagnosis Date Noted  . Parkinson's disease (tremor, stiffness, slow motion, unstable posture) ([place]) 11/13/2014  . Piriformis syndrome of left side 05/08/2014  . DDD (degenerative disc disease), lumbosacral 03/25/2014  . Abnormality of gait 03/25/2014  . Hypothyroidism 10/30/2013  . RBBB 10/07/2013  . Left shoulder pain 12/15/2011  . Postmenopause atrophic vaginitis 11/16/2011  . At high risk for falls 04/20/2011  . Blood type, Rh negative   . Thyroid cancer ([place])   . Endocervical polyp   . Osteoarthrosis, unspecified whether generalized or localized, involving lower leg 05/12/2009  . DEGENERATIVE DISC DISEASE, CERVICAL SPINE, W/RADICULOPATHY 04/30/2009  . BACK PAIN, THORACIC REGION, CHRONIC 04/30/2009  . DEPRESSION 11/14/2007  . Essential hypertension 11/14/2007  . CELLULITIS 11/14/2007  . BREAST CANCER, HX OF 11/14/2007  . THYROID CANCER, HX OF 11/14/2007    Ransome Helwig W. 06/14/2017, 5:31 AM  Frazier Butt., PT   [facility] [address] [place], [place], [address] Phone: [phone]   Fax:  [phone]  Name: Michelle Sims MRN: [id number] Date of Birth: December 12, 1929

## 2017-06-16 ENCOUNTER — Encounter: Payer: Self-pay | Admitting: Physical Therapy

## 2017-06-16 ENCOUNTER — Ambulatory Visit: Payer: BLUE CROSS/BLUE SHIELD | Admitting: Physical Therapy

## 2017-06-16 DIAGNOSIS — M6281 Muscle weakness (generalized): Secondary | ICD-10-CM | POA: Diagnosis not present

## 2017-06-16 DIAGNOSIS — R293 Abnormal posture: Secondary | ICD-10-CM | POA: Diagnosis not present

## 2017-06-16 DIAGNOSIS — R2681 Unsteadiness on feet: Secondary | ICD-10-CM

## 2017-06-16 DIAGNOSIS — R2689 Other abnormalities of gait and mobility: Secondary | ICD-10-CM | POA: Diagnosis not present

## 2017-06-16 NOTE — Therapy (Signed)
[facility] [address] [place], [place], [address] Phone: [phone]   Fax:  [phone]  Physical Therapy Treatment  Patient Details  Name: Michelle Sims MRN: [id number] Date of Birth: 1929/02/03 Referring Provider: Alonza Bogus   Encounter Date: 06/16/2017  PT End of Session - 06/16/17 1557    Visit Number  2    Number of Visits  17    Date for PT Re-Evaluation  09/11/17    Authorization Type  BCBS, Medicare    PT Start Time  1400    PT Stop Time  1450    PT Time Calculation (min)  50 min    Activity Tolerance  Patient tolerated treatment well    Behavior During Therapy  [facility] for tasks assessed/performed       Past Medical History:  Diagnosis Date  . Anxiety   . Arthritis    knee & back   . Blood type, Rh negative   . Breast cancer ([place])   . Chronic fatigue   . Depression   . Diverticulosis   . Endocervical polyp   . GERD (gastroesophageal reflux disease)   . Hypertension   . Hypothyroidism   . IBS (irritable bowel syndrome)   . Insomnia   . Lymphedema   . Neuromuscular disorder ([place])    Parkinson's disease   . Osteopenia 06/2015   T score -2.4 distal third of the radius  . Parkinson disease ([place])   . RBBB 10/07/2013  . Thyroid cancer [facility])     Past Surgical History:  Procedure Laterality Date  . APPENDECTOMY  1979  . BREAST SURGERY  1979   MASTECTOMY AND RECONSTRUCTION  . DILATION AND CURETTAGE OF UTERUS     MISCARRIAGE  . EYE SURGERY Bilateral    w/ IOL  . HYSTEROSCOPY  [id number]   AND D&C  . MASTECTOMY Bilateral   . THYROIDECTOMY    . TONSILLECTOMY    . TOTAL KNEE ARTHROPLASTY Right 10/07/2013   Procedure: RIGHT TOTAL KNEE ARTHROPLASTY;  Surgeon: Kerin Salen, MD;  Location: [facility];  Service: Orthopedics;  Laterality: Right;  . TUBAL LIGATION      There were no vitals filed for this visit.  Subjective Assessment - 06/16/17 1403    Subjective  No changes, no falls. Did not  consider using a cane or walker after PT eval. Reports she doesn't usually catch her toe like she did that day (husband confirms it was not her typical walking that day. *Of note, the clinnic floors were waxed 5/19 and staff and patients have been "catching" their feet when walking).  Reports she was really nervous about coming today. Asking for handouts for exercises, "because my memory is not so good."  "[name]"    Patient is accompained by:  Family member husband    Pertinent History  anxiety, arthritis, breast ca, depression, diverticulosis, GERD, HTN, IBS, osteopenia, R TKR; per Dr. Doristine Devoid notes-dementia    Patient Stated Goals  Pt's goals for therapy for improved mobility and balance. Less fear of falling    Currently in Pain?  Yes    Pain Score  5     Pain Location  Back    Pain Orientation  Posterior;Distal;Lower    Pain Descriptors / Indicators  Aching    Pain Type  Chronic pain    Pain Onset  More than a month ago    Pain Frequency  Intermittent    Aggravating Factors   husband reports  it's how she pulls heavy, wet clothes out of the washer to put in the dryer above (he has told her to let him do it when he gets home, but she will not wait)         [facility] PT Assessment - 06/16/17 0001      Ambulation/Gait   Ambulation/Gait Assistance  4: Min guard    Ambulation/Gait Assistance Details  no episodes of catching either foot; minguard for safety due to previous performance    Ambulation Distance (Feet)  80 Feet 120, 50    Assistive device  None    Gait Pattern  Step-through pattern;Decreased step length - right;Decreased step length - left;Decreased dorsiflexion - right;Decreased dorsiflexion - left;Narrow base of support;Poor foot clearance - left;Poor foot clearance - right;Decreased arm swing - right;Decreased arm swing - left;Right flexed knee in stance;Left flexed knee in stance    Ambulation Surface  Indoor    Gait Comments  Pt again noted she has a fear of falling on steps and on  curbs (did not hve time to practice this date)                   [facility] Adult PT Treatment/Exercise - 06/16/17 1412      Self-Care   Self-Care  Other Self-Care Comments    Other Self-Care Comments   Using at least a cane would help decrease her fear of falling and acts as a red flag for people in public to give her a bit more space/time. Husband reports Dr. Erling Cruz told her 5 yrs ago she needed to use a cane. Dr.Tat told her to use a RW. She insists she does not need either.       Exercises   Exercises  Knee/Hip      Knee/Hip Exercises: Aerobic   Nustep  warm-up  L4, 3 min (seat 8, LUE only 9) did not use RUE due to lymphedema             PT Education - 06/16/17 1555    Education Details  PT can help with balance, and needs to be committed to doing the HEP if she wants to see results; see pt instructions for HEP    Person(s) Educated  Patient;Spouse    Methods  Explanation;Demonstration;Handout    Comprehension  Verbalized understanding;Returned demonstration;Verbal cues required;Need further instruction       PT Short Term Goals - 06/13/17 2206      PT SHORT TERM GOAL #1   Title  Pt will perform HEP with husband's supervision for improved balance and gait.    Time  4    Period  Weeks    Status  New    Target Date  07/07/17      PT SHORT TERM GOAL #2   Title  Pt will improve TUG score to less than or equal to 15 seconds for decreased fall risk.    Time  4    Period  Weeks    Status  New    Target Date  07/07/17      PT SHORT TERM GOAL #3   Title  Pt will improve postural control, as evidenced by ability to regain posterior balance in posterior push and release test, in 2 steps or less.    Time  4    Period  Weeks    Status  New    Target Date  07/07/17      PT SHORT TERM GOAL #4   Title  Pt/husband will verbalize understanding of fall prevention in home environment.    Time  4    Period  Weeks    Status  New    Target Date  07/07/17        PT  Long Term Goals - 06/14/17 0527      PT LONG TERM GOAL #1   Title  Pt and husband will verbalize continued community fitness, including updated HEP for continued targeted work on balance and gait.  TARGET 08/04/17    Time  8    Period  Weeks    Status  New    Target Date  08/04/17      PT LONG TERM GOAL #2   Title  Pt will improve TUG score to less than or equal to 13.5 sec for decreased fall risk.    Time  8    Period  Weeks    Status  New    Target Date  08/04/17      PT LONG TERM GOAL #3   Title  Pt will improve MiniBESTest score to at least 14/28 for decreased fall risk.    Time  8    Period  Weeks    Status  New    Target Date  08/04/17      PT LONG TERM GOAL #4   Title  Pt will ambulate at least 1000 ft over indoor/outdoor surfaces, with supervision and appropriate assistive device, for improved community gait.    Time  8    Period  Weeks    Status  New    Target Date  08/04/17            Plan - 06/16/17 1607    Clinical Impression Statement  Patient has not yet been convinced that use of a cane or walker would decrease her risk of falling (and likely decr her fear of falling). She was not sure when she was going to find time to do her HEP (especially the exercises where her husband needs to be present). "Between my othre exercise classes, all I'm going to do all day is exercise." Will focus next session on adding 1-2 balance exercises to her HEP (will try to keep HEP to a minimum as she is resistant already). Will need to address using a cane for up/down curb and show how cane could benefit her. Asked husband to attend every session until we tell him he is not needed (due to her decr memory and his ability to provide additional insights into her situation/condition). He also needs to learn her HEP so he can assist her at home.     Rehab Potential  Good    Clinical Impairments Affecting Rehab Potential  hx of mild dementia; husband present for eval and appears supportive     PT Frequency  2x / week    PT Duration  8 weeks includes eval week    PT Treatment/Interventions  ADLs/Self Care Home Management;DME Instruction;Balance training;Therapeutic exercise;Therapeutic activities;Functional mobility training;Stair training;Gait training;Neuromuscular re-education;Patient/family education    PT Next Visit Plan  briefly review HEP from 5/24 (a few reps of each); add 1-2 balance or strengthening exercises for functional strength and for standing balance.  Gait training with possible assistive device (feel she's more likely to try cane; show her benefit of cane on curb) versus walker (for improved confidence with longer distance walking)    Consulted and Agree with Plan of Care  Patient;Family member/caregiver    Family Member Consulted  Husband       Patient will benefit from skilled therapeutic intervention in order to improve the following deficits and impairments:  Abnormal gait, Decreased balance, Decreased safety awareness, Decreased strength, Difficulty walking, Decreased mobility, Postural dysfunction  Visit Diagnosis: Other abnormalities of gait and mobility  Unsteadiness on feet  Muscle weakness (generalized)     Problem List Patient Active Problem List   Diagnosis Date Noted  . Parkinson's disease (tremor, stiffness, slow motion, unstable posture) ([place]) 11/13/2014  . Piriformis syndrome of left side 05/08/2014  . DDD (degenerative disc disease), lumbosacral 03/25/2014  . Abnormality of gait 03/25/2014  . Hypothyroidism 10/30/2013  . RBBB 10/07/2013  . Left shoulder pain 12/15/2011  . Postmenopause atrophic vaginitis 11/16/2011  . At high risk for falls 04/20/2011  . Blood type, Rh negative   . Thyroid cancer ([place])   . Endocervical polyp   . Osteoarthrosis, unspecified whether generalized or localized, involving lower leg 05/12/2009  . DEGENERATIVE DISC DISEASE, CERVICAL SPINE, W/RADICULOPATHY 04/30/2009  . BACK PAIN, THORACIC REGION, CHRONIC  04/30/2009  . DEPRESSION 11/14/2007  . Essential hypertension 11/14/2007  . CELLULITIS 11/14/2007  . BREAST CANCER, HX OF 11/14/2007  . THYROID CANCER, HX OF 11/14/2007    Rexanne Mano, PT 06/16/2017, 4:17 PM  [facility] [address] [place], [place], [address] Phone: [phone]   Fax:  [phone]  Name: Michelle Sims MRN: [id number] Date of Birth: Dec 25, 1929

## 2017-06-16 NOTE — Patient Instructions (Signed)
Access Code: [email or web address]  URL: [email or web address]  Date: 06/16/2017  Prepared by: Barry [facility]   Exercises  Sit to Stand without Arm Support - 10 reps - 1 sets - 3x daily - 7x weekly  Side Stepping with Counter Support - 10 reps - 3 sets - 1x daily - 7x weekly  Walking with Counter Support - 5 reps - 1x daily - 7x weekly

## 2017-06-28 ENCOUNTER — Ambulatory Visit: Payer: BLUE CROSS/BLUE SHIELD | Attending: Internal Medicine | Admitting: Physical Therapy

## 2017-06-28 ENCOUNTER — Encounter: Payer: Self-pay | Admitting: Physical Therapy

## 2017-06-28 DIAGNOSIS — R2689 Other abnormalities of gait and mobility: Secondary | ICD-10-CM | POA: Diagnosis not present

## 2017-06-28 DIAGNOSIS — R2681 Unsteadiness on feet: Secondary | ICD-10-CM | POA: Insufficient documentation

## 2017-06-28 DIAGNOSIS — R293 Abnormal posture: Secondary | ICD-10-CM | POA: Insufficient documentation

## 2017-06-28 DIAGNOSIS — M6281 Muscle weakness (generalized): Secondary | ICD-10-CM | POA: Insufficient documentation

## 2017-06-28 NOTE — Therapy (Signed)
[facility] [address] [place], [place], [address] Phone: [phone]   Fax:  [phone]  Physical Therapy Treatment  Patient Details  Name: Michelle Sims MRN: [id number] Date of Birth: 05/14/1929 Referring Provider: Alonza Bogus   Encounter Date: 06/28/2017  PT End of Session - 06/28/17 1251    Visit Number  3    Number of Visits  17    Date for PT Re-Evaluation  09/11/17    Authorization Type  BCBS, Medicare    PT Start Time  1148    PT Stop Time  1235    PT Time Calculation (min)  47 min    Activity Tolerance  Patient tolerated treatment well    Behavior During Therapy  [facility] for tasks assessed/performed       Past Medical History:  Diagnosis Date  . Anxiety   . Arthritis    knee & back   . Blood type, Rh negative   . Breast cancer ([place])   . Chronic fatigue   . Depression   . Diverticulosis   . Endocervical polyp   . GERD (gastroesophageal reflux disease)   . Hypertension   . Hypothyroidism   . IBS (irritable bowel syndrome)   . Insomnia   . Lymphedema   . Neuromuscular disorder ([place])    Parkinson's disease   . Osteopenia 06/2015   T score -2.4 distal third of the radius  . Parkinson disease ([place])   . RBBB 10/07/2013  . Thyroid cancer [facility])     Past Surgical History:  Procedure Laterality Date  . APPENDECTOMY  1979  . BREAST SURGERY  1979   MASTECTOMY AND RECONSTRUCTION  . DILATION AND CURETTAGE OF UTERUS     MISCARRIAGE  . EYE SURGERY Bilateral    w/ IOL  . HYSTEROSCOPY  [id number]   AND D&C  . MASTECTOMY Bilateral   . THYROIDECTOMY    . TONSILLECTOMY    . TOTAL KNEE ARTHROPLASTY Right 10/07/2013   Procedure: RIGHT TOTAL KNEE ARTHROPLASTY;  Surgeon: Kerin Salen, MD;  Location: [facility];  Service: Orthopedics;  Laterality: Right;  . TUBAL LIGATION      There were no vitals filed for this visit.  Subjective Assessment - 06/28/17 1150    Subjective  Pt had some questions on the  exercises, but tried to perform them the best she could. "[name]"    Patient is accompained by:  Family member husband    Pertinent History  anxiety, arthritis, breast ca, depression, diverticulosis, GERD, HTN, IBS, osteopenia, R TKR; per Dr. Doristine Devoid notes-dementia    Patient Stated Goals  Pt's goals for therapy for improved mobility and balance. Less fear of falling    Currently in Pain?  Yes    Pain Score  5     Pain Location  Back    Pain Orientation  Posterior;Distal;Lower    Pain Descriptors / Indicators  Aching    Pain Type  Chronic pain    Pain Onset  More than a month ago    Pain Frequency  Intermittent                       [facility] Adult PT Treatment/Exercise - 06/28/17 0001      Ambulation/Gait   Ambulation/Gait  Yes    Ambulation/Gait Assistance  4: Min guard    Ambulation/Gait Assistance Details  self corrected imbalance with turns; training: working on balance with visual scanning and increasing speed,  using a SPC                       Ambulation Distance (Feet)  230 Feet x2    Assistive device  Straight cane    Gait Pattern  Step-through pattern;Decreased step length - right;Decreased step length - left;Decreased dorsiflexion - right;Decreased dorsiflexion - left;Narrow base of support;Poor foot clearance - left;Poor foot clearance - right;Decreased arm swing - right;Decreased arm swing - left;Right flexed knee in stance;Left flexed knee in stance    Ambulation Surface  Indoor    Stairs  Yes    Stairs Assistance  5: Supervision    Stairs Assistance Details (indicate cue type and reason)  multiple reps, cues for keeping feet forward when descending.    Stair Management Technique  Two rails;Alternating pattern;Forwards    Number of Stairs  12    Ramp  4: Min assist    Ramp Details (indicate cue type and reason)  performed without AD then with AD working on increasing steplength and greater confidence of movement especially descending    Curb  4: Min assist     Curb Details (indicate cue type and reason)  performed with SPC and HHA x2 cues for sequence and safe technique      Knee/Hip Exercises: Aerobic   Nustep  L5, 4min; pt tolerated using all extremities today.          Balance Exercises - 06/28/17 1247      Balance Exercises: Standing   Sidestepping  Upper extremity support;4 reps along counter    Marching Limitations  fwd/bwd with counter for UE support, cues to increase hip flexion    Sit to Stand Time  cues for posture and forward trunk lean with arms out x10 supervison                PT Education - 06/28/17 1249    Education provided  Yes    Education Details  Reviewed HEP given last visit 06/16/17.  Recommend that pt walk with cane and with husband on track for exercise and to practise getting comfortable with using SPC.    Person(s) Educated  Patient;Spouse    Methods  Explanation;Demonstration;Handout    Comprehension  Verbalized understanding;Returned demonstration;Verbal cues required;Need further instruction       PT Short Term Goals - 06/13/17 2206      PT SHORT TERM GOAL #1   Title  Pt will perform HEP with husband's supervision for improved balance and gait.    Time  4    Period  Weeks    Status  New    Target Date  07/07/17      PT SHORT TERM GOAL #2   Title  Pt will improve TUG score to less than or equal to 15 seconds for decreased fall risk.    Time  4    Period  Weeks    Status  New    Target Date  07/07/17      PT SHORT TERM GOAL #3   Title  Pt will improve postural control, as evidenced by ability to regain posterior balance in posterior push and release test, in 2 steps or less.    Time  4    Period  Weeks    Status  New    Target Date  07/07/17      PT SHORT TERM GOAL #4   Title  Pt/husband will verbalize understanding of fall prevention in home environment.  Time  4    Period  Weeks    Status  New    Target Date  07/07/17        PT Long Term Goals - 06/14/17 0527      PT LONG TERM  GOAL #1   Title  Pt and husband will verbalize continued community fitness, including updated HEP for continued targeted work on balance and gait.  TARGET 08/04/17    Time  8    Period  Weeks    Status  New    Target Date  08/04/17      PT LONG TERM GOAL #2   Title  Pt will improve TUG score to less than or equal to 13.5 sec for decreased fall risk.    Time  8    Period  Weeks    Status  New    Target Date  08/04/17      PT LONG TERM GOAL #3   Title  Pt will improve MiniBESTest score to at least 14/28 for decreased fall risk.    Time  8    Period  Weeks    Status  New    Target Date  08/04/17      PT LONG TERM GOAL #4   Title  Pt will ambulate at least 1000 ft over indoor/outdoor surfaces, with supervision and appropriate assistive device, for improved community gait.    Time  8    Period  Weeks    Status  New    Target Date  08/04/17            Plan - 06/28/17 1252    Clinical Impression Statement  Pt demonstration that she did perform HEP at home. HEP was reviewed and performed with min cues for technique and intermittent UE support for standing  balance.  Training for functional gait with community barriers. Initially performed without SPC at min guard level but slowly introduced using SPC for ramp, curb, and dynamic gait; pt was not resistent to using SPC.                                                                          Rehab Potential  Good    Clinical Impairments Affecting Rehab Potential  hx of mild dementia; husband present for eval and appears supportive    PT Frequency  2x / week    PT Duration  8 weeks includes eval week    PT Treatment/Interventions  ADLs/Self Care Home Management;DME Instruction;Balance training;Therapeutic exercise;Therapeutic activities;Functional mobility training;Stair training;Gait training;Neuromuscular re-education;Patient/family education    PT Next Visit Plan  add 1-2 balance or strengthening exercises for functional strength and  for standing balance to HEP.  Gait training with possible assistive device (feel she's more likely to try cane; show her benefit of cane on curb) versus walker (for improved confidence with longer distance walking)    Consulted and Agree with Plan of Care  Patient;Family member/caregiver    Family Member Consulted  Husband       Patient will benefit from skilled therapeutic intervention in order to improve the following deficits and impairments:  Abnormal gait, Decreased balance, Decreased safety awareness, Decreased strength, Difficulty walking, Decreased mobility, Postural dysfunction  Visit  Diagnosis: Other abnormalities of gait and mobility  Unsteadiness on feet  Muscle weakness (generalized)     Problem List Patient Active Problem List   Diagnosis Date Noted  . Parkinson's disease (tremor, stiffness, slow motion, unstable posture) ([place]) 11/13/2014  . Piriformis syndrome of left side 05/08/2014  . DDD (degenerative disc disease), lumbosacral 03/25/2014  . Abnormality of gait 03/25/2014  . Hypothyroidism 10/30/2013  . RBBB 10/07/2013  . Left shoulder pain 12/15/2011  . Postmenopause atrophic vaginitis 11/16/2011  . At high risk for falls 04/20/2011  . Blood type, Rh negative   . Thyroid cancer ([place])   . Endocervical polyp   . Osteoarthrosis, unspecified whether generalized or localized, involving lower leg 05/12/2009  . DEGENERATIVE DISC DISEASE, CERVICAL SPINE, W/RADICULOPATHY 04/30/2009  . BACK PAIN, THORACIC REGION, CHRONIC 04/30/2009  . DEPRESSION 11/14/2007  . Essential hypertension 11/14/2007  . CELLULITIS 11/14/2007  . BREAST CANCER, HX OF 11/14/2007  . THYROID CANCER, HX OF 11/14/2007    Bjorn Loser, PTA  06/28/17, 1:19 PM [facility] [address] [place], [place], [address] Phone: [phone]   Fax:  [phone]  Name: DONNAE MICHELS MRN: [id number] Date of Birth: Feb 04, 1929

## 2017-06-30 ENCOUNTER — Ambulatory Visit: Payer: BLUE CROSS/BLUE SHIELD | Admitting: Physical Therapy

## 2017-07-03 ENCOUNTER — Ambulatory Visit: Payer: BLUE CROSS/BLUE SHIELD | Admitting: Physical Therapy

## 2017-07-03 ENCOUNTER — Encounter: Payer: Self-pay | Admitting: Physical Therapy

## 2017-07-03 DIAGNOSIS — R2689 Other abnormalities of gait and mobility: Secondary | ICD-10-CM

## 2017-07-03 DIAGNOSIS — R2681 Unsteadiness on feet: Secondary | ICD-10-CM | POA: Diagnosis not present

## 2017-07-03 DIAGNOSIS — M6281 Muscle weakness (generalized): Secondary | ICD-10-CM | POA: Diagnosis not present

## 2017-07-03 DIAGNOSIS — R293 Abnormal posture: Secondary | ICD-10-CM | POA: Diagnosis not present

## 2017-07-03 NOTE — Therapy (Signed)
[facility] [address] [place], [place], [address] Phone: [phone]   Fax:  [phone]  Physical Therapy Treatment  Patient Details  Name: Michelle Sims MRN: [id number] Date of Birth: 12-14-1929 Referring Provider: Alonza Bogus   Encounter Date: 07/03/2017  PT End of Session - 07/03/17 1502    Visit Number  4    Number of Visits  17    Date for PT Re-Evaluation  09/11/17    Authorization Type  BCBS, Medicare    PT Start Time  5573    PT Stop Time  1447    PT Time Calculation (min)  43 min    Activity Tolerance  Patient tolerated treatment well asked to take BP:  162/89    Behavior During Therapy  [facility] for tasks assessed/performed       Past Medical History:  Diagnosis Date  . Anxiety   . Arthritis    knee & back   . Blood type, Rh negative   . Breast cancer ([place])   . Chronic fatigue   . Depression   . Diverticulosis   . Endocervical polyp   . GERD (gastroesophageal reflux disease)   . Hypertension   . Hypothyroidism   . IBS (irritable bowel syndrome)   . Insomnia   . Lymphedema   . Neuromuscular disorder ([place])    Parkinson's disease   . Osteopenia 06/2015   T score -2.4 distal third of the radius  . Parkinson disease ([place])   . RBBB 10/07/2013  . Thyroid cancer [facility])     Past Surgical History:  Procedure Laterality Date  . APPENDECTOMY  1979  . BREAST SURGERY  1979   MASTECTOMY AND RECONSTRUCTION  . DILATION AND CURETTAGE OF UTERUS     MISCARRIAGE  . EYE SURGERY Bilateral    w/ IOL  . HYSTEROSCOPY  [id number]   AND D&C  . MASTECTOMY Bilateral   . THYROIDECTOMY    . TONSILLECTOMY    . TOTAL KNEE ARTHROPLASTY Right 10/07/2013   Procedure: RIGHT TOTAL KNEE ARTHROPLASTY;  Surgeon: Kerin Salen, MD;  Location: [facility];  Service: Orthopedics;  Laterality: Right;  . TUBAL LIGATION      There were no vitals filed for this visit.  Subjective Assessment - 07/03/17 1407    Subjective  With  practicing sit<>stand, missed the edge of chair and went down to floor.  Got right  back up without difficulty. "[name]"    Patient is accompained by:  Family member husband    Pertinent History  anxiety, arthritis, breast ca, depression, diverticulosis, GERD, HTN, IBS, osteopenia, R TKR; per Dr. Doristine Devoid notes-dementia    Patient Stated Goals  Pt's goals for therapy for improved mobility and balance. Less fear of falling    Currently in Pain?  No/denies    Pain Onset  More than a month ago                       [facility] Adult PT Treatment/Exercise - 07/03/17 0001      Transfers   Transfers  Sit to Stand;Stand to Sit    Sit to Stand  5: Supervision;Without upper extremity assist;From chair/3-in-1    Stand to Sit  6: Modified independent (Device/Increase time);Without upper extremity assist;To chair/3-in-1    Transfer Cueing  Cues for increased forward lean and upright posture x 5 reps      Ambulation/Gait   Ambulation/Gait  Yes    Ambulation/Gait Assistance  4: Min guard    Ambulation/Gait Assistance Details  With [facility] (with small rubber quad tip), gait training for improved equal, increased step length and heelstrike    Ambulation Distance (Feet)  230 Feet x 4 reps; 80 ft x 2 with cane; no device 200 ft    Assistive device  Straight cane;None    Gait Pattern  Step-through pattern;Decreased step length - right;Decreased dorsiflexion - right;Narrow base of support;Poor foot clearance - left;Poor foot clearance - right    Ambulation Surface  Indoor    Curb  4: Min assist    Curb Details (indicate cue type and reason)  Performed with cane, HHA of 1, cues for technique    Pre-Gait Activities  Given pt's improved gait pattern with husband cueing (simulating an area near dining room where she typically moves well), PT continues to recommend use of cane for longer distances and unfamiliar places (but with husband's supervision and cues for short, familiar distances, practice without cane)     Gait Comments  With gait (with cane and without cane), PT (and husband) provide cues for BIG step length, BIG foot clearance with gait          Balance Exercises - 07/03/17 1428      Balance Exercises: Standing   Stepping Strategy  Posterior;Lateral;Anterior;UE support;10 reps    Heel Raises Limitations  2 sets x 10 reps    Toe Raise Limitations  2 sets x 10 reps    Sit to Stand Time  cues for posture and forward trunk lean with arms out x10 supervison         at least 5 additional reps throughout session    Other Standing Exercises  Wide BOS lateral weightshifting 10 reps 2 sets; then stagger stance forward/back rocking 15 reps with cues for anterior/posterior hip excursion, then forward<>back stepping x 10 reps for limits of stability and for increased step length/heelstrike          PT Short Term Goals - 06/13/17 2206      PT SHORT TERM GOAL #1   Title  Pt will perform HEP with husband's supervision for improved balance and gait.    Time  4    Period  Weeks    Status  New    Target Date  07/07/17      PT SHORT TERM GOAL #2   Title  Pt will improve TUG score to less than or equal to 15 seconds for decreased fall risk.    Time  4    Period  Weeks    Status  New    Target Date  07/07/17      PT SHORT TERM GOAL #3   Title  Pt will improve postural control, as evidenced by ability to regain posterior balance in posterior push and release test, in 2 steps or less.    Time  4    Period  Weeks    Status  New    Target Date  07/07/17      PT SHORT TERM GOAL #4   Title  Pt/husband will verbalize understanding of fall prevention in home environment.    Time  4    Period  Weeks    Status  New    Target Date  07/07/17        PT Long Term Goals - 06/14/17 0527      PT LONG TERM GOAL #1   Title  Pt and husband will verbalize continued community fitness,  including updated HEP for continued targeted work on balance and gait.  TARGET 08/04/17    Time  8    Period  Weeks     Status  New    Target Date  08/04/17      PT LONG TERM GOAL #2   Title  Pt will improve TUG score to less than or equal to 13.5 sec for decreased fall risk.    Time  8    Period  Weeks    Status  New    Target Date  08/04/17      PT LONG TERM GOAL #3   Title  Pt will improve MiniBESTest score to at least 14/28 for decreased fall risk.    Time  8    Period  Weeks    Status  New    Target Date  08/04/17      PT LONG TERM GOAL #4   Title  Pt will ambulate at least 1000 ft over indoor/outdoor surfaces, with supervision and appropriate assistive device, for improved community gait.    Time  8    Period  Weeks    Status  New    Target Date  08/04/17            Plan - 07/03/17 1502    Clinical Impression Statement  Skilled PT session focused on gait training with SPC, with brief bout of gait training without cane (pt requires cues for upright posture and for consistent BIG step length, heelstrike, foot clearance, especially on RLE).  PT continues to recommend cane use to increase stability and confidence with gait, though PT did discuss with husband's cues and with supervision, short distances in familiar places may be okay to practice without cane. (May need to check on how she does with this at next visit).  Worked on standing exercises to emphasize limits of stability, heelstrike/foot clearance and hip excursion for improved gait pattern. Pt will continue to benefit from skilled PT to address balance and gait.    Rehab Potential  Good    Clinical Impairments Affecting Rehab Potential  hx of mild dementia; husband present for eval and appears supportive    PT Frequency  2x / week    PT Duration  8 weeks includes eval week    PT Treatment/Interventions  ADLs/Self Care Home Management;DME Instruction;Balance training;Therapeutic exercise;Therapeutic activities;Functional mobility training;Stair training;Gait training;Neuromuscular re-education;Patient/family education    PT Next  Visit Plan  add 1-2 balance or strengthening exercises for functional strength and for standing balance to HEP (consider forward or forward/back step and weightshift to retry and to add to HEP).  Gait training with cane; curb and ramp training with cane    Consulted and Agree with Plan of Care  Patient;Family member/caregiver    Family Member Consulted  Husband       Patient will benefit from skilled therapeutic intervention in order to improve the following deficits and impairments:  Abnormal gait, Decreased balance, Decreased safety awareness, Decreased strength, Difficulty walking, Decreased mobility, Postural dysfunction  Visit Diagnosis: Other abnormalities of gait and mobility  Unsteadiness on feet     Problem List Patient Active Problem List   Diagnosis Date Noted  . Parkinson's disease (tremor, stiffness, slow motion, unstable posture) ([place]) 11/13/2014  . Piriformis syndrome of left side 05/08/2014  . DDD (degenerative disc disease), lumbosacral 03/25/2014  . Abnormality of gait 03/25/2014  . Hypothyroidism 10/30/2013  . RBBB 10/07/2013  . Left shoulder pain 12/15/2011  . Postmenopause atrophic  vaginitis 11/16/2011  . At high risk for falls 04/20/2011  . Blood type, Rh negative   . Thyroid cancer ([place])   . Endocervical polyp   . Osteoarthrosis, unspecified whether generalized or localized, involving lower leg 05/12/2009  . DEGENERATIVE DISC DISEASE, CERVICAL SPINE, W/RADICULOPATHY 04/30/2009  . BACK PAIN, THORACIC REGION, CHRONIC 04/30/2009  . DEPRESSION 11/14/2007  . Essential hypertension 11/14/2007  . CELLULITIS 11/14/2007  . BREAST CANCER, HX OF 11/14/2007  . THYROID CANCER, HX OF 11/14/2007    Frazier Butt. 07/03/2017, 3:07 PM Frazier Butt., PT  [facility] [address] [place], [place], [address] Phone: [phone]   Fax:  [phone]  Name: NEALIE MCHATTON MRN: [id number] Date of Birth:  [birth year]

## 2017-07-06 ENCOUNTER — Telehealth: Payer: Self-pay | Admitting: Neurology

## 2017-07-06 DIAGNOSIS — I10 Essential (primary) hypertension: Secondary | ICD-10-CM | POA: Diagnosis not present

## 2017-07-06 DIAGNOSIS — R413 Other amnesia: Secondary | ICD-10-CM | POA: Diagnosis not present

## 2017-07-06 DIAGNOSIS — F339 Major depressive disorder, recurrent, unspecified: Secondary | ICD-10-CM | POA: Diagnosis not present

## 2017-07-06 DIAGNOSIS — E039 Hypothyroidism, unspecified: Secondary | ICD-10-CM | POA: Diagnosis not present

## 2017-07-06 NOTE — Telephone Encounter (Signed)
Spoke with patient's husband. He states they saw Dr. Joella Prince at [facility] who took patient off of Exelon patch for 6 weeks to see if this would help fatigue (notes in care everywhere). This did not help - they want to go back on it but don't know if they should start at 9.5 mg patch or slowly start at lower dose again.  Please advise.

## 2017-07-06 NOTE — Telephone Encounter (Signed)
Patient's husband called needing to speak with you regarding the patch she is wearing and he has some questions for you. Please Call. Thanks

## 2017-07-07 ENCOUNTER — Encounter: Payer: Self-pay | Admitting: Physical Therapy

## 2017-07-07 ENCOUNTER — Ambulatory Visit: Payer: BLUE CROSS/BLUE SHIELD | Admitting: Physical Therapy

## 2017-07-07 DIAGNOSIS — R2689 Other abnormalities of gait and mobility: Secondary | ICD-10-CM | POA: Diagnosis not present

## 2017-07-07 DIAGNOSIS — R2681 Unsteadiness on feet: Secondary | ICD-10-CM | POA: Diagnosis not present

## 2017-07-07 DIAGNOSIS — M6281 Muscle weakness (generalized): Secondary | ICD-10-CM | POA: Diagnosis not present

## 2017-07-07 DIAGNOSIS — R293 Abnormal posture: Secondary | ICD-10-CM | POA: Diagnosis not present

## 2017-07-07 NOTE — Patient Instructions (Signed)
  Stand with your back to the corner, but not touching the walls. Have a chair or spouse in front of you in case you lose your balance.   Feet Together, Head Motion - Eyes Open    With eyes open, feet together, move head slowly: up and down x 1 minute. Then move your head left and right x 1 minute.  Do __1__ sessions per day.  Copyright  VHI. All rights reserved.

## 2017-07-07 NOTE — Telephone Encounter (Signed)
Sounds like they need to ask the physician that changed the med.  Looks like just saw geriatrics on 06/05/17

## 2017-07-07 NOTE — Therapy (Signed)
[facility] [address] [place], [place], [address] Phone: [phone]   Fax:  [phone]  Physical Therapy Treatment  Patient Details  Name: Michelle Sims MRN: [id number] Date of Birth: 02-05-1929 Referring Provider: Alonza Bogus   Encounter Date: 07/07/2017  PT End of Session - 07/07/17 1607    Visit Number  5    Number of Visits  17    Date for PT Re-Evaluation  09/11/17    Authorization Type  BCBS, Medicare    PT Start Time  1405    PT Stop Time  1449    PT Time Calculation (min)  44 min    Activity Tolerance  Patient tolerated treatment well asked to take BP:  162/89    Behavior During Therapy  [facility] for tasks assessed/performed       Past Medical History:  Diagnosis Date  . Anxiety   . Arthritis    knee & back   . Blood type, Rh negative   . Breast cancer ([place])   . Chronic fatigue   . Depression   . Diverticulosis   . Endocervical polyp   . GERD (gastroesophageal reflux disease)   . Hypertension   . Hypothyroidism   . IBS (irritable bowel syndrome)   . Insomnia   . Lymphedema   . Neuromuscular disorder ([place])    Parkinson's disease   . Osteopenia 06/2015   T score -2.4 distal third of the radius  . Parkinson disease ([place])   . RBBB 10/07/2013  . Thyroid cancer [facility])     Past Surgical History:  Procedure Laterality Date  . APPENDECTOMY  1979  . BREAST SURGERY  1979   MASTECTOMY AND RECONSTRUCTION  . DILATION AND CURETTAGE OF UTERUS     MISCARRIAGE  . EYE SURGERY Bilateral    w/ IOL  . HYSTEROSCOPY  [id number]   AND D&C  . MASTECTOMY Bilateral   . THYROIDECTOMY    . TONSILLECTOMY    . TOTAL KNEE ARTHROPLASTY Right 10/07/2013   Procedure: RIGHT TOTAL KNEE ARTHROPLASTY;  Surgeon: Kerin Salen, MD;  Location: [place];  Service: Orthopedics;  Laterality: Right;  . TUBAL LIGATION      There were no vitals filed for this visit.  Subjective Assessment - 07/07/17 1407    Subjective  Hasn't  felt well-low energy. Perhaps due to coming off Cymbalta (per husband; reports MD aware). Husband requesting pt practice up down curb, ramp, and stairs with her cane every visit as these are the things she is most "terrified" of doing in the community.  "Michelle Sims"    Patient is accompained by:  Family member husband    Pertinent History  anxiety, arthritis, breast ca, depression, diverticulosis, GERD, HTN, IBS, osteopenia, R TKR; per Dr. Doristine Devoid notes-dementia    Patient Stated Goals  Pt's goals for therapy for improved mobility and balance. Less fear of falling    Currently in Pain?  No/denies    Pain Onset  More than a month ago                         [facility] Adult PT Treatment/Exercise - 07/07/17 1636      Ambulation/Gait   Ambulation/Gait  Yes    Ambulation/Gait Assistance  4: Min guard    Ambulation/Gait Assistance Details  very short steps with or without cane; did well with cues for heel to toe in lengthening her steps and improved foot clearance  Ambulation Distance (Feet)  100 Feet 115 x 2; 60 x 2    Assistive device  Straight cane;None    Gait Pattern  Step-through pattern;Decreased step length - right;Decreased dorsiflexion - right;Narrow base of support;Poor foot clearance - left;Poor foot clearance - right    Ambulation Surface  Indoor    Ramp  4: Min assist    Ramp Details (indicate cue type and reason)  x2 with HHA vc for shorter steps and wt thru her heels as descending; x 1 with cane    Curb  4: Min assist    Curb Details (indicate cue type and reason)  with cane x 2 with max cues for sequence, proximity to edge of curb    Pre-Gait Activities  at counter, step forward heel to toe with wt-shift and step back; each leg with opposite hand on counter x 10 reps          Balance Exercises - 07/07/17 1640      Balance Exercises: Standing   Standing Eyes Opened  Narrow base of support (BOS);Head turns;Solid surface    Standing Eyes Closed  Narrow base of support  (BOS);30 secs    Balance Beam  black beam crosswise; feet apart, head turns intermittent single UE vs no UE support    Retro Gait  4 reps in // bars        PT Education - 07/07/17 1607    Education Details  addition to HEP    Person(s) Educated  Patient;Spouse    Methods  Explanation;Demonstration;Handout;Verbal cues;Tactile cues    Comprehension  Verbalized understanding;Returned demonstration;Verbal cues required;Tactile cues required;Need further instruction       PT Short Term Goals - 06/13/17 2206      PT SHORT TERM GOAL #1   Title  Pt will perform HEP with husband's supervision for improved balance and gait.    Time  4    Period  Weeks    Status  New    Target Date  07/07/17      PT SHORT TERM GOAL #2   Title  Pt will improve TUG score to less than or equal to 15 seconds for decreased fall risk.    Time  4    Period  Weeks    Status  New    Target Date  07/07/17      PT SHORT TERM GOAL #3   Title  Pt will improve postural control, as evidenced by ability to regain posterior balance in posterior push and release test, in 2 steps or less.    Time  4    Period  Weeks    Status  New    Target Date  07/07/17      PT SHORT TERM GOAL #4   Title  Pt/husband will verbalize understanding of fall prevention in home environment.    Time  4    Period  Weeks    Status  New    Target Date  07/07/17        PT Long Term Goals - 06/14/17 0527      PT LONG TERM GOAL #1   Title  Pt and husband will verbalize continued community fitness, including updated HEP for continued targeted work on balance and gait.  TARGET 08/04/17    Time  8    Period  Weeks    Status  New    Target Date  08/04/17      PT LONG TERM GOAL #2  Title  Pt will improve TUG score to less than or equal to 13.5 sec for decreased fall risk.    Time  8    Period  Weeks    Status  New    Target Date  08/04/17      PT LONG TERM GOAL #3   Title  Pt will improve MiniBESTest score to at least 14/28 for  decreased fall risk.    Time  8    Period  Weeks    Status  New    Target Date  08/04/17      PT LONG TERM GOAL #4   Title  Pt will ambulate at least 1000 ft over indoor/outdoor surfaces, with supervision and appropriate assistive device, for improved community gait.    Time  8    Period  Weeks    Status  New    Target Date  08/04/17            Plan - 07/07/17 1610    Clinical Impression Statement  Session focused on balance training and gait training including ramp and curb. Patient is very anxious on steps,curb, and ramp (with or without cane) and husband requested we practice these each visit (especially due to her memory impairment). Overall, pt is able to make adjustments to technique and carryover through remainder of repetitions. She was able to recall emphasizing heelstrike when walking when asked her to tell her husband what we worked on. Husband very involved and supportive. Continue to work towards goals.     Rehab Potential  Good    Clinical Impairments Affecting Rehab Potential  hx of mild dementia; husband present for eval and appears supportive    PT Frequency  2x / week    PT Duration  8 weeks includes eval week    PT Treatment/Interventions  ADLs/Self Care Home Management;DME Instruction;Balance training;Therapeutic exercise;Therapeutic activities;Functional mobility training;Stair training;Gait training;Neuromuscular re-education;Patient/family education    PT Next Visit Plan  husband requests practice ramp and curb with cane, and stairs with rail every visit (these are the tasks that she fears most and freezes), add 1-2 balance or strengthening exercises for functional strength and for standing balance to HEP (consider forward or forward/back step and weightshift to retry and to add to HEP).  Gait training with cane;     Consulted and Agree with Plan of Care  Patient;Family member/caregiver    Family Member Consulted  Husband       Patient will benefit from skilled  therapeutic intervention in order to improve the following deficits and impairments:  Abnormal gait, Decreased balance, Decreased safety awareness, Decreased strength, Difficulty walking, Decreased mobility, Postural dysfunction  Visit Diagnosis: Other abnormalities of gait and mobility  Unsteadiness on feet  Muscle weakness (generalized)     Problem List Patient Active Problem List   Diagnosis Date Noted  . Parkinson's disease (tremor, stiffness, slow motion, unstable posture) ([place]) 11/13/2014  . Piriformis syndrome of left side 05/08/2014  . DDD (degenerative disc disease), lumbosacral 03/25/2014  . Abnormality of gait 03/25/2014  . Hypothyroidism 10/30/2013  . RBBB 10/07/2013  . Left shoulder pain 12/15/2011  . Postmenopause atrophic vaginitis 11/16/2011  . At high risk for falls 04/20/2011  . Blood type, Rh negative   . Thyroid cancer ([place])   . Endocervical polyp   . Osteoarthrosis, unspecified whether generalized or localized, involving lower leg 05/12/2009  . DEGENERATIVE DISC DISEASE, CERVICAL SPINE, W/RADICULOPATHY 04/30/2009  . BACK PAIN, THORACIC REGION, CHRONIC 04/30/2009  . DEPRESSION  11/14/2007  . Essential hypertension 11/14/2007  . CELLULITIS 11/14/2007  . BREAST CANCER, HX OF 11/14/2007  . THYROID CANCER, HX OF 11/14/2007    Rexanne Mano, PT 07/07/2017, 4:42 PM  [facility] [address] [place], [place], [address] Phone: [phone]   Fax:  [phone]  Name: HALLEIGH COMES MRN: [id number] Date of Birth: 05/19/1929

## 2017-07-07 NOTE — Telephone Encounter (Signed)
Patient's husband made aware.  

## 2017-07-10 ENCOUNTER — Ambulatory Visit: Payer: BLUE CROSS/BLUE SHIELD | Admitting: Physical Therapy

## 2017-07-10 DIAGNOSIS — M6281 Muscle weakness (generalized): Secondary | ICD-10-CM | POA: Diagnosis not present

## 2017-07-10 DIAGNOSIS — R293 Abnormal posture: Secondary | ICD-10-CM | POA: Diagnosis not present

## 2017-07-10 DIAGNOSIS — R2681 Unsteadiness on feet: Secondary | ICD-10-CM

## 2017-07-10 DIAGNOSIS — R2689 Other abnormalities of gait and mobility: Secondary | ICD-10-CM | POA: Diagnosis not present

## 2017-07-10 NOTE — Therapy (Signed)
[facility] [address] [place], [place], [address] Phone: [phone]   Fax:  [phone]  Physical Therapy Treatment  Patient Details  Name: Michelle Sims MRN: [id number] Date of Birth: Nov 10, 1929 Referring Provider: Alonza Bogus   Encounter Date: 07/10/2017  PT End of Session - 07/10/17 1526    Visit Number  6    Number of Visits  17    Date for PT Re-Evaluation  09/11/17    Authorization Type  BCBS, Medicare    PT Start Time  0404    PT Stop Time  0454    PT Time Calculation (min)  50 min    Activity Tolerance  Patient tolerated treatment well    Behavior During Therapy  [facility] for tasks assessed/performed       Past Medical History:  Diagnosis Date  . Anxiety   . Arthritis    knee & back   . Blood type, Rh negative   . Breast cancer ([place])   . Chronic fatigue   . Depression   . Diverticulosis   . Endocervical polyp   . GERD (gastroesophageal reflux disease)   . Hypertension   . Hypothyroidism   . IBS (irritable bowel syndrome)   . Insomnia   . Lymphedema   . Neuromuscular disorder ([place])    Parkinson's disease   . Osteopenia 06/2015   T score -2.4 distal third of the radius  . Parkinson disease ([place])   . RBBB 10/07/2013  . Thyroid cancer [facility])     Past Surgical History:  Procedure Laterality Date  . APPENDECTOMY  1979  . BREAST SURGERY  1979   MASTECTOMY AND RECONSTRUCTION  . DILATION AND CURETTAGE OF UTERUS     MISCARRIAGE  . EYE SURGERY Bilateral    w/ IOL  . HYSTEROSCOPY  [id number]   AND D&C  . MASTECTOMY Bilateral   . THYROIDECTOMY    . TONSILLECTOMY    . TOTAL KNEE ARTHROPLASTY Right 10/07/2013   Procedure: RIGHT TOTAL KNEE ARTHROPLASTY;  Surgeon: Kerin Salen, MD;  Location: [facility];  Service: Orthopedics;  Laterality: Right;  . TUBAL LIGATION      There were no vitals filed for this visit.  Subjective Assessment - 07/10/17 1414    Subjective  Feel like I'm more shaky than  usual in the past week.  Haven't been able to do my exercises too much due to death of a close friend. "[name]"    Patient is accompained by:  Family member husband    Pertinent History  anxiety, arthritis, breast ca, depression, diverticulosis, GERD, HTN, IBS, osteopenia, R TKR; per Dr. Doristine Devoid notes-dementia    Patient Stated Goals  Pt's goals for therapy for improved mobility and balance. Less fear of falling    Currently in Pain?  No/denies    Pain Onset  More than a month ago                       [facility] Adult PT Treatment/Exercise - 07/10/17 0001      Ambulation/Gait   Ambulation/Gait  Yes    Ambulation/Gait Assistance  4: Min guard    Ambulation/Gait Assistance Details  Reminder cues for proper cane sequence, cues to increase step length, relaxed L arm swing    Ambulation Distance (Feet)  100 Feet 125, then 200 ft, 50 ft    Assistive device  Straight cane with small rubber quad tip    Gait Pattern  Step-through pattern;Decreased step length - right;Decreased dorsiflexion - right;Narrow base of support;Poor foot clearance - left;Poor foot clearance - right    Ambulation Surface  Indoor;Level    Stairs  Yes    Stairs Assistance  5: Supervision    Stair Management Technique  One rail Left;With cane;Alternating pattern;Forwards    Number of Stairs  4    Height of Stairs  6    Ramp  4: Min assist    Ramp Details (indicate cue type and reason)  x 4 reps with cane, with cues for step length, posture, improved heelstrike when descending    Curb  4: Min assist    Curb Details (indicate cue type and reason)  with cane, x 4 reps, with max cues     Gait Comments  Husband present at end of session for last rep practice of curb, ramp, steps with instruction on where to stand and how to cue patient          Balance Exercises - 07/10/17 1430      Balance Exercises: Standing   Standing Eyes Opened  Narrow base of support (BOS);Head turns;Solid surface Head nods x 1 min-review  of HEP    Retro Gait  5 reps;Upper extremity support Forward/back walking at counter-cues for posture,step length    Other Standing Exercises  STagger stance forward/back weightshfiting x 15 reps each leg; then forward<>back step and weightshift x 15 reps each side-cues for heelstrike and increased step length        PT Education - 07/10/17 1525    Education provided  Yes    Education Details  addition to [employer]    Person(s) Educated  Patient;Spouse    Methods  Explanation;Demonstration;Verbal cues;Handout    Comprehension  Verbalized understanding;Returned demonstration;Verbal cues required;Need further instruction       PT Short Term Goals - 06/13/17 2206      PT SHORT TERM GOAL #1   Title  Pt will perform HEP with husband's supervision for improved balance and gait.    Time  4    Period  Weeks    Status  New    Target Date  07/07/17      PT SHORT TERM GOAL #2   Title  Pt will improve TUG score to less than or equal to 15 seconds for decreased fall risk.    Time  4    Period  Weeks    Status  New    Target Date  07/07/17      PT SHORT TERM GOAL #3   Title  Pt will improve postural control, as evidenced by ability to regain posterior balance in posterior push and release test, in 2 steps or less.    Time  4    Period  Weeks    Status  New    Target Date  07/07/17      PT SHORT TERM GOAL #4   Title  Pt/husband will verbalize understanding of fall prevention in home environment.    Time  4    Period  Weeks    Status  New    Target Date  07/07/17        PT Long Term Goals - 06/14/17 0527      PT LONG TERM GOAL #1   Title  Pt and husband will verbalize continued community fitness, including updated HEP for continued targeted work on balance and gait.  TARGET 08/04/17    Time  8  Period  Weeks    Status  New    Target Date  08/04/17      PT LONG TERM GOAL #2   Title  Pt will improve TUG score to less than or equal to 13.5 sec for decreased fall  risk.    Time  8    Period  Weeks    Status  New    Target Date  08/04/17      PT LONG TERM GOAL #3   Title  Pt will improve MiniBESTest score to at least 14/28 for decreased fall risk.    Time  8    Period  Weeks    Status  New    Target Date  08/04/17      PT LONG TERM GOAL #4   Title  Pt will ambulate at least 1000 ft over indoor/outdoor surfaces, with supervision and appropriate assistive device, for improved community gait.    Time  8    Period  Weeks    Status  New    Target Date  08/04/17            Plan - 07/10/17 1526    Clinical Impression Statement  Continued work on gait training on level, indoor surfaces with cane, as well as ramp, curb, stair training.  Pt continues to be very anxious on curb, steps, and ramp and requires max verbal cues.  Will continue to involve husband in education in how best way to help and cue her for safe curb, ramp, stair negotiation.      Rehab Potential  Good    Clinical Impairments Affecting Rehab Potential  hx of mild dementia; husband present for eval and appears supportive    PT Frequency  2x / week    PT Duration  8 weeks includes eval week    PT Treatment/Interventions  ADLs/Self Care Home Management;DME Instruction;Balance training;Therapeutic exercise;Therapeutic activities;Functional mobility training;Stair training;Gait training;Neuromuscular re-education;Patient/family education    PT Next Visit Plan  husband requests practice ramp and curb with cane, and stairs with rail every visit (these are the tasks that she fears most and freezes), educate husband in where he can be to help her with guarding and cues; check HEP ; Gait training with cane;  Check STGs as this is wk 4 in POC    Consulted and Agree with Plan of Care  Patient;Family member/caregiver    Family Member Consulted  Husband       Patient will benefit from skilled therapeutic intervention in order to improve the following deficits and impairments:  Abnormal gait,  Decreased balance, Decreased safety awareness, Decreased strength, Difficulty walking, Decreased mobility, Postural dysfunction  Visit Diagnosis: Other abnormalities of gait and mobility  Unsteadiness on feet     Problem List Patient Active Problem List   Diagnosis Date Noted  . Parkinson's disease (tremor, stiffness, slow motion, unstable posture) ([place]) 11/13/2014  . Piriformis syndrome of left side 05/08/2014  . DDD (degenerative disc disease), lumbosacral 03/25/2014  . Abnormality of gait 03/25/2014  . Hypothyroidism 10/30/2013  . RBBB 10/07/2013  . Left shoulder pain 12/15/2011  . Postmenopause atrophic vaginitis 11/16/2011  . At high risk for falls 04/20/2011  . Blood type, Rh negative   . Thyroid cancer ([place])   . Endocervical polyp   . Osteoarthrosis, unspecified whether generalized or localized, involving lower leg 05/12/2009  . DEGENERATIVE DISC DISEASE, CERVICAL SPINE, W/RADICULOPATHY 04/30/2009  . BACK PAIN, THORACIC REGION, CHRONIC 04/30/2009  . DEPRESSION 11/14/2007  . Essential  hypertension 11/14/2007  . CELLULITIS 11/14/2007  . BREAST CANCER, HX OF 11/14/2007  . THYROID CANCER, HX OF 11/14/2007    Michelle Campoli W. 07/10/2017, 3:31 PM  Frazier Butt., PT   [facility] [address] [place], [place], [address] Phone: [phone]   Fax:  [phone]  Name: Michelle Sims MRN: [id number] Date of Birth: December 16, 1929

## 2017-07-10 NOTE — Patient Instructions (Addendum)
Single Step: Forward / Backward    Lifting foot off floor, take one step slowly forward with right leg. Then swing leg back and take one step backward.  Repeat forward<>backward step on same leg  Repeat _10___ times per session. Do _1-2___ sessions per day. Repeat with other leg.  Copyright  VHI. All rights reserved.

## 2017-07-13 NOTE — Telephone Encounter (Signed)
Pt's husband calling states they have not been able to reach physician and states pt needs to be put back on medication. Best call back # [phone].

## 2017-07-14 ENCOUNTER — Encounter: Payer: Self-pay | Admitting: Rehabilitative and Restorative Service Providers"

## 2017-07-14 ENCOUNTER — Ambulatory Visit: Payer: BLUE CROSS/BLUE SHIELD | Admitting: Rehabilitative and Restorative Service Providers"

## 2017-07-14 DIAGNOSIS — L821 Other seborrheic keratosis: Secondary | ICD-10-CM | POA: Diagnosis not present

## 2017-07-14 DIAGNOSIS — D485 Neoplasm of uncertain behavior of skin: Secondary | ICD-10-CM | POA: Diagnosis not present

## 2017-07-14 DIAGNOSIS — R293 Abnormal posture: Secondary | ICD-10-CM

## 2017-07-14 DIAGNOSIS — M6281 Muscle weakness (generalized): Secondary | ICD-10-CM | POA: Diagnosis not present

## 2017-07-14 DIAGNOSIS — R2689 Other abnormalities of gait and mobility: Secondary | ICD-10-CM

## 2017-07-14 DIAGNOSIS — L570 Actinic keratosis: Secondary | ICD-10-CM | POA: Diagnosis not present

## 2017-07-14 DIAGNOSIS — R2681 Unsteadiness on feet: Secondary | ICD-10-CM

## 2017-07-14 DIAGNOSIS — Z85828 Personal history of other malignant neoplasm of skin: Secondary | ICD-10-CM | POA: Diagnosis not present

## 2017-07-14 NOTE — Therapy (Signed)
[facility] [address] [place], [place], [address] Phone: [phone]   Fax:  [phone]  Physical Therapy Treatment  Patient Details  Name: Michelle Sims MRN: [id number] Date of Birth: June 28, 1929 Referring Provider: Alonza Bogus   Encounter Date: 07/14/2017  PT End of Session - 07/14/17 1412    Visit Number  7    Number of Visits  17    Date for PT Re-Evaluation  09/11/17    Authorization Type  BCBS, Medicare    PT Start Time  1408    PT Stop Time  1448    PT Time Calculation (min)  40 min    Equipment Utilized During Treatment  Gait belt    Activity Tolerance  Patient tolerated treatment well    Behavior During Therapy  [facility] for tasks assessed/performed       Past Medical History:  Diagnosis Date  . Anxiety   . Arthritis    knee & back   . Blood type, Rh negative   . Breast cancer ([place])   . Chronic fatigue   . Depression   . Diverticulosis   . Endocervical polyp   . GERD (gastroesophageal reflux disease)   . Hypertension   . Hypothyroidism   . IBS (irritable bowel syndrome)   . Insomnia   . Lymphedema   . Neuromuscular disorder ([place])    Parkinson's disease   . Osteopenia 06/2015   T score -2.4 distal third of the radius  . Parkinson disease ([place])   . RBBB 10/07/2013  . Thyroid cancer [facility])     Past Surgical History:  Procedure Laterality Date  . APPENDECTOMY  1979  . BREAST SURGERY  1979   MASTECTOMY AND RECONSTRUCTION  . DILATION AND CURETTAGE OF UTERUS     MISCARRIAGE  . EYE SURGERY Bilateral    w/ IOL  . HYSTEROSCOPY  [id number]   AND D&C  . MASTECTOMY Bilateral   . THYROIDECTOMY    . TONSILLECTOMY    . TOTAL KNEE ARTHROPLASTY Right 10/07/2013   Procedure: RIGHT TOTAL KNEE ARTHROPLASTY;  Surgeon: Kerin Salen, MD;  Location: [facility];  Service: Orthopedics;  Laterality: Right;  . TUBAL LIGATION      There were no vitals filed for this visit.  Subjective Assessment - 07/14/17  1411    Subjective  The patient was walking around the clinic (left lobby at 2:05) and PT had to find her in hallway at 2:08.  Patient notes she hasn't done any exercises due to death of her friend last week.    Patient is accompained by:  Family member husband dropped off    Patient Stated Goals  Pt's goals for therapy for improved mobility and balance. Less fear of falling    Currently in Pain?  No/denies         [facility] PT Assessment - 07/14/17 1424      Timed Up and Go Test   TUG  -- 20.69sec, no device; 20.51 with SPC    TUG Comments  Scores >13.5-15 sec indicate increased fall risk.                   [facility] Adult PT Treatment/Exercise - 07/14/17 1621      Transfers   Transfers  Sit to Stand;Stand to Sit    Sit to Stand  6: Modified independent (Device/Increase time);5: Supervision    Stand to Sit  6: Modified independent (Device/Increase time)    Transfer Cueing  PT provided cues for "nose over toes" during sit>stand and stand>sit to improve mechanics and efficiency.  Worked up upright posture and sit<>stand without hands.      Ambulation/Gait   Ambulation/Gait  Yes    Ambulation/Gait Assistance  4: Min guard    Ambulation/Gait Assistance Details  With cane, worked on [place] and using auditory cues for more rhythmic pace and to increase speed of movement.  Without cane provided tactile cues for arm swing.  Patient notes she does not use device indoors.    Ambulation Distance (Feet)  345 Feet x 4 times    Assistive device  Straight cane with quad tip    Gait Pattern  Step-through pattern;Decreased step length - right;Decreased dorsiflexion - right;Narrow base of support;Poor foot clearance - left;Poor foot clearance - right    Ambulation Surface  Indoor;Level    Stairs  Yes    Stairs Assistance  5: Supervision    Stair Management Technique  One rail Left;With cane;Alternating pattern;Forwards    Number of Stairs  4    Ramp  4: Min assist    Ramp Details  (indicate cue type and reason)  x 2 reps with cane with demo and verbal cues for longer step length    Curb  4: Min assist    Curb Details (indicate cue type and reason)  with cane x 2 reps with demo and max cues    Gait Comments  Walking forward/backwards with emphasis on larger steps with CGA for safety.      Neuro Re-ed    Neuro Re-ed Details   PT and patient worked on large amplitude movements to iimprove weight shifting including posterior stepping > upright, lateral reaching wieht weight shifting.    Reviewed HEP of sidestepping, sit<>stand.      Exercises   Exercises  Other Exercises    Other Exercises   Seated postural strengthening moving into scapular retraction, lateral elbow leaning with elongation on contralateral side.               PT Education - 07/14/17 1614    Education provided  Yes    Education Details  Patient's husand returned to clinic at end of session and had questions about home activities.  We discussed home program and patient noted short counter at home, but long hallways with rail.  PT recommended to patient and her husband working on long stride in hallway forward/backward and doing sidestepping HEP in hallway (with husband present)    Person(s) Educated  Patient;Spouse    Methods  Explanation;Demonstration;Verbal cues    Comprehension  Verbalized understanding;Returned demonstration;Need further instruction;Verbal cues required       PT Short Term Goals - 07/14/17 1413      PT SHORT TERM GOAL #1   Title  Pt will perform HEP with husband's supervision for improved balance and gait.    Baseline  Patient has HEP established, however has not been doing x 1 week due to death of a friend.    Time  4    Period  Weeks    Status  Partially Met    Target Date  07/07/17      PT SHORT TERM GOAL #2   Title  Pt will improve TUG score to less than or equal to 15 seconds for decreased fall risk.    Baseline  20.69 seconds without a device    Time  4     Period  Weeks    Status  Not  Met      PT SHORT TERM GOAL #3   Title  Pt will improve postural control, as evidenced by ability to regain posterior balance in posterior push and release test, in 2 steps or less.    Baseline  Patient maintained balance without stepping.      Time  4    Period  Weeks    Status  Achieved      PT SHORT TERM GOAL #4   Title  Pt/husband will verbalize understanding of fall prevention in home environment.    Time  4    Period  Weeks    Status  New        PT Long Term Goals - 06/14/17 0527      PT LONG TERM GOAL #1   Title  Pt and husband will verbalize continued community fitness, including updated HEP for continued targeted work on balance and gait.  TARGET 08/04/17    Time  8    Period  Weeks    Status  New    Target Date  08/04/17      PT LONG TERM GOAL #2   Title  Pt will improve TUG score to less than or equal to 13.5 sec for decreased fall risk.    Time  8    Period  Weeks    Status  New    Target Date  08/04/17      PT LONG TERM GOAL #3   Title  Pt will improve MiniBESTest score to at least 14/28 for decreased fall risk.    Time  8    Period  Weeks    Status  New    Target Date  08/04/17      PT LONG TERM GOAL #4   Title  Pt will ambulate at least 1000 ft over indoor/outdoor surfaces, with supervision and appropriate assistive device, for improved community gait.    Time  8    Period  Weeks    Status  New    Target Date  08/04/17            Plan - 07/14/17 1617    Clinical Impression Statement  The patient partially met HEP STG,  and met posterior push test goal.  She did not meet TUG goal with continued decrease in speed with transitions, turns, and gait.  The patient responded well today to metronome auditory cues for longer stride and pacing. She also carried over arm swing tactile cues within session.      PT Treatment/Interventions  ADLs/Self Care Home Management;DME Instruction;Balance training;Therapeutic  exercise;Therapeutic activities;Functional mobility training;Stair training;Gait training;Neuromuscular re-education;Patient/family education    PT Next Visit Plan  Husband requests continued practice with curb, ramp, stairs with cane.  Educate husband on how to help guard patient, gait training with cane, HEP progression, work on improving power of movement for functional activities    Consulted and Agree with Plan of Care  Patient;Family member/caregiver    Family Member Consulted  Husband- arrived at end of session       Patient will benefit from skilled therapeutic intervention in order to improve the following deficits and impairments:  Abnormal gait, Decreased balance, Decreased safety awareness, Decreased strength, Difficulty walking, Decreased mobility, Postural dysfunction  Visit Diagnosis: Other abnormalities of gait and mobility  Unsteadiness on feet  Muscle weakness (generalized)  Abnormal posture     Problem List Patient Active Problem List   Diagnosis Date Noted  . Parkinson's disease (tremor, stiffness,  slow motion, unstable posture) ([place]) 11/13/2014  . Piriformis syndrome of left side 05/08/2014  . DDD (degenerative disc disease), lumbosacral 03/25/2014  . Abnormality of gait 03/25/2014  . Hypothyroidism 10/30/2013  . RBBB 10/07/2013  . Left shoulder pain 12/15/2011  . Postmenopause atrophic vaginitis 11/16/2011  . At high risk for falls 04/20/2011  . Blood type, Rh negative   . Thyroid cancer ([place])   . Endocervical polyp   . Osteoarthrosis, unspecified whether generalized or localized, involving lower leg 05/12/2009  . DEGENERATIVE DISC DISEASE, CERVICAL SPINE, W/RADICULOPATHY 04/30/2009  . BACK PAIN, THORACIC REGION, CHRONIC 04/30/2009  . DEPRESSION 11/14/2007  . Essential hypertension 11/14/2007  . CELLULITIS 11/14/2007  . BREAST CANCER, HX OF 11/14/2007  . THYROID CANCER, HX OF 11/14/2007    Leann Mayweather, PT 07/14/2017, 4:48 PM  [facility] [address] [place], [place], [address] Phone: [phone]   Fax:  [phone]  Name: Michelle Sims MRN: [id number] Date of Birth: 06-Aug-1929

## 2017-07-14 NOTE — Telephone Encounter (Signed)
I have already made the husband aware he will need to await instruction from the office that stopped the medication about how to restart.

## 2017-07-17 ENCOUNTER — Encounter: Payer: Self-pay | Admitting: Physical Therapy

## 2017-07-17 ENCOUNTER — Ambulatory Visit: Payer: BLUE CROSS/BLUE SHIELD | Admitting: Physical Therapy

## 2017-07-17 DIAGNOSIS — M6281 Muscle weakness (generalized): Secondary | ICD-10-CM | POA: Diagnosis not present

## 2017-07-17 DIAGNOSIS — R2689 Other abnormalities of gait and mobility: Secondary | ICD-10-CM | POA: Diagnosis not present

## 2017-07-17 DIAGNOSIS — R293 Abnormal posture: Secondary | ICD-10-CM

## 2017-07-17 DIAGNOSIS — R2681 Unsteadiness on feet: Secondary | ICD-10-CM

## 2017-07-17 NOTE — Therapy (Signed)
[facility] [address] [place], [place], [address] Phone: [phone]   Fax:  [phone]  Physical Therapy Treatment  Patient Details  Name: Michelle Sims MRN: [id number] Date of Birth: 03-Oct-1929 Referring Provider: Alonza Bogus   Encounter Date: 07/17/2017  PT End of Session - 07/17/17 1554    Visit Number  8    Number of Visits  17    Date for PT Re-Evaluation  09/11/17    Authorization Type  BCBS, Medicare    PT Start Time  1406    PT Stop Time  1445    PT Time Calculation (min)  39 min    Equipment Utilized During Treatment  Gait belt    Activity Tolerance  Patient tolerated treatment well    Behavior During Therapy  [facility] for tasks assessed/performed       Past Medical History:  Diagnosis Date  . Anxiety   . Arthritis    knee & back   . Blood type, Rh negative   . Breast cancer ([place])   . Chronic fatigue   . Depression   . Diverticulosis   . Endocervical polyp   . GERD (gastroesophageal reflux disease)   . Hypertension   . Hypothyroidism   . IBS (irritable bowel syndrome)   . Insomnia   . Lymphedema   . Neuromuscular disorder ([place])    Parkinson's disease   . Osteopenia 06/2015   T score -2.4 distal third of the radius  . Parkinson disease ([place])   . RBBB 10/07/2013  . Thyroid cancer [facility])     Past Surgical History:  Procedure Laterality Date  . APPENDECTOMY  1979  . BREAST SURGERY  1979   MASTECTOMY AND RECONSTRUCTION  . DILATION AND CURETTAGE OF UTERUS     MISCARRIAGE  . EYE SURGERY Bilateral    w/ IOL  . HYSTEROSCOPY  [id number]   AND D&C  . MASTECTOMY Bilateral   . THYROIDECTOMY    . TONSILLECTOMY    . TOTAL KNEE ARTHROPLASTY Right 10/07/2013   Procedure: RIGHT TOTAL KNEE ARTHROPLASTY;  Surgeon: Kerin Salen, MD;  Location: [facility] [place];  Service: Orthopedics;  Laterality: Right;  . TUBAL LIGATION      There were no vitals filed for this visit.  Subjective Assessment - 07/17/17  1411    Subjective  Just very tired today-trying to pack for the beach, but otherwise okay.    Patient is accompained by:  Family member husband dropped off    Patient Stated Goals  Pt's goals for therapy for improved mobility and balance. Less fear of falling    Currently in Pain?  No/denies                       [facility] Adult PT Treatment/Exercise - 07/17/17 0001      Transfers   Transfers  Sit to Stand;Stand to Sit    Sit to Stand  6: Modified independent (Device/Increase time);5: Supervision    Stand to Sit  6: Modified independent (Device/Increase time)    Number of Reps  2 sets;Other reps (comment) 5 reps, from mat, then from chair    Transfer Cueing  PT provides cues for nose over toes with forward lean and upright stand.      Ambulation/Gait   Ambulation/Gait  Yes    Ambulation/Gait Assistance  4: Min guard    Ambulation/Gait Assistance Details  With cane, hand over hand assistance provided at times for  sequence; without cane, used "boomwhackers" with facilitation for reciprocal arm swing, then let go to incorporate reciprocal arm swing independently.    Ambulation Distance (Feet)  345 Feet cane, 345 ft no device, then 200 ft x 2 w/ arm swing assist    Assistive device  Straight cane;None small rubber quad tip    Gait Pattern  Step-through pattern;Decreased step length - right;Decreased dorsiflexion - right;Narrow base of support;Poor foot clearance - left;Poor foot clearance - right    Ambulation Surface  Level;Indoor    Gait Comments  Ended session with 3 bouts of 100 ft of gait without device, with cues for increased step length and arm swing.  Pt able to maintain for this distance, then begins less arm swing and decreased step length.      High Level Balance   High Level Balance Comments  Heel/toe raises x 10 reps, 2 sets at parallel bars      Neuro Re-ed    Neuro Re-ed Details   Standing at counter:  gentle squats to upright posture, x 10 reps with  facilitation for scapular retraction, wide BOS lateral weightshifting x 10 reps; reviewed forward>back step and weightshifting 2 sets x 10 (review of HEP and pt requests clarification); then large step and weightshift to side, then return to midline x 10 reps with cues for technique, for overall increased amplitude of movements.              PT Education - 07/17/17 1553    Education provided  Yes    Education Details  Reviewed pt's questions on HEP exercise (forward/back step and weightshift)    Person(s) Educated  Patient    Methods  Explanation;Demonstration;Verbal cues;Handout    Comprehension  Returned demonstration;Verbalized understanding;Verbal cues required       PT Short Term Goals - 07/14/17 1413      PT SHORT TERM GOAL #1   Title  Pt will perform HEP with husband's supervision for improved balance and gait.    Baseline  Patient has HEP established, however has not been doing x 1 week due to death of a friend.    Time  4    Period  Weeks    Status  Partially Met    Target Date  07/07/17      PT SHORT TERM GOAL #2   Title  Pt will improve TUG score to less than or equal to 15 seconds for decreased fall risk.    Baseline  20.69 seconds without a device    Time  4    Period  Weeks    Status  Not Met      PT SHORT TERM GOAL #3   Title  Pt will improve postural control, as evidenced by ability to regain posterior balance in posterior push and release test, in 2 steps or less.    Baseline  Patient maintained balance without stepping.      Time  4    Period  Weeks    Status  Achieved      PT SHORT TERM GOAL #4   Title  Pt/husband will verbalize understanding of fall prevention in home environment.    Time  4    Period  Weeks    Status  New        PT Long Term Goals - 06/14/17 0527      PT LONG TERM GOAL #1   Title  Pt and husband will verbalize continued community fitness, including updated HEP for  continued targeted work on balance and gait.  TARGET 08/04/17     Time  8    Period  Weeks    Status  New    Target Date  08/04/17      PT LONG TERM GOAL #2   Title  Pt will improve TUG score to less than or equal to 13.5 sec for decreased fall risk.    Time  8    Period  Weeks    Status  New    Target Date  08/04/17      PT LONG TERM GOAL #3   Title  Pt will improve MiniBESTest score to at least 14/28 for decreased fall risk.    Time  8    Period  Weeks    Status  New    Target Date  08/04/17      PT LONG TERM GOAL #4   Title  Pt will ambulate at least 1000 ft over indoor/outdoor surfaces, with supervision and appropriate assistive device, for improved community gait.    Time  8    Period  Weeks    Status  New    Target Date  08/04/17            Plan - 07/17/17 1554    Clinical Impression Statement  Pt's husband not present at PT session at all this visit, so fall prevention education goal not addressed today.  Focused on standing exercises to incorporate large amplitude movement patterns and gait training with and without cane for improved arm swing, step length, and posture.  Pt requires frequent cues for reminders for gait technique, especially for large amplitude when not using cane.  Pt would benefit from husband being present for at least part of session due to pt's memory issues.    Rehab Potential  Good    Clinical Impairments Affecting Rehab Potential  hx of mild dementia; husband present for eval and appears supportive    PT Frequency  2x / week    PT Duration  8 weeks includes eval week    PT Treatment/Interventions  ADLs/Self Care Home Management;DME Instruction;Balance training;Therapeutic exercise;Therapeutic activities;Functional mobility training;Stair training;Gait training;Neuromuscular re-education;Patient/family education    PT Next Visit Plan  Husband requests continued practice with curb, ramp, stairs with cane.  Educate husband on how to help guard patient, gait training with cane, HEP progression, work on  improving power of movement for functional activities    Consulted and Agree with Plan of Care  Patient    Family Member Consulted          Patient will benefit from skilled therapeutic intervention in order to improve the following deficits and impairments:  Abnormal gait, Decreased balance, Decreased safety awareness, Decreased strength, Difficulty walking, Decreased mobility, Postural dysfunction  Visit Diagnosis: Other abnormalities of gait and mobility  Unsteadiness on feet  Abnormal posture     Problem List Patient Active Problem List   Diagnosis Date Noted  . Parkinson's disease (tremor, stiffness, slow motion, unstable posture) ([place]) 11/13/2014  . Piriformis syndrome of left side 05/08/2014  . DDD (degenerative disc disease), lumbosacral 03/25/2014  . Abnormality of gait 03/25/2014  . Hypothyroidism 10/30/2013  . RBBB 10/07/2013  . Left shoulder pain 12/15/2011  . Postmenopause atrophic vaginitis 11/16/2011  . At high risk for falls 04/20/2011  . Blood type, Rh negative   . Thyroid cancer ([place])   . Endocervical polyp   . Osteoarthrosis, unspecified whether generalized or localized, involving lower leg 05/12/2009  .  DEGENERATIVE DISC DISEASE, CERVICAL SPINE, W/RADICULOPATHY 04/30/2009  . BACK PAIN, THORACIC REGION, CHRONIC 04/30/2009  . DEPRESSION 11/14/2007  . Essential hypertension 11/14/2007  . CELLULITIS 11/14/2007  . BREAST CANCER, HX OF 11/14/2007  . THYROID CANCER, HX OF 11/14/2007    Diasia Henken W. 07/17/2017, 3:57 PM  Frazier Butt., PT  [facility] [address] [place], [place], [address] Phone: [phone]   Fax:  [phone]  Name: KEGAN SHEPARDSON MRN: [id number] Date of Birth: 01/17/1930

## 2017-07-21 ENCOUNTER — Ambulatory Visit: Payer: BLUE CROSS/BLUE SHIELD | Admitting: Physical Therapy

## 2017-07-31 DIAGNOSIS — L03113 Cellulitis of right upper limb: Secondary | ICD-10-CM | POA: Diagnosis not present

## 2017-07-31 DIAGNOSIS — R300 Dysuria: Secondary | ICD-10-CM | POA: Diagnosis not present

## 2017-08-01 DIAGNOSIS — H26493 Other secondary cataract, bilateral: Secondary | ICD-10-CM | POA: Diagnosis not present

## 2017-08-01 DIAGNOSIS — H52203 Unspecified astigmatism, bilateral: Secondary | ICD-10-CM | POA: Diagnosis not present

## 2017-08-02 ENCOUNTER — Encounter: Payer: Self-pay | Admitting: Physical Therapy

## 2017-08-02 ENCOUNTER — Ambulatory Visit: Payer: BLUE CROSS/BLUE SHIELD | Attending: Internal Medicine | Admitting: Physical Therapy

## 2017-08-02 ENCOUNTER — Ambulatory Visit: Payer: BLUE CROSS/BLUE SHIELD | Admitting: Physical Therapy

## 2017-08-02 ENCOUNTER — Other Ambulatory Visit: Payer: Self-pay

## 2017-08-02 DIAGNOSIS — R2681 Unsteadiness on feet: Secondary | ICD-10-CM | POA: Insufficient documentation

## 2017-08-02 DIAGNOSIS — R293 Abnormal posture: Secondary | ICD-10-CM

## 2017-08-02 DIAGNOSIS — M6281 Muscle weakness (generalized): Secondary | ICD-10-CM | POA: Diagnosis not present

## 2017-08-02 DIAGNOSIS — R2689 Other abnormalities of gait and mobility: Secondary | ICD-10-CM | POA: Insufficient documentation

## 2017-08-02 DIAGNOSIS — I972 Postmastectomy lymphedema syndrome: Secondary | ICD-10-CM | POA: Insufficient documentation

## 2017-08-02 NOTE — Therapy (Signed)
[place], [place], [address] Phone: [phone]   Fax:  [phone]  Physical Therapy Evaluation  Patient Details  Name: Michelle Sims MRN: [id number] Date of Birth: December 31, 1929 Referring Provider: Dr. Wenda Low   Encounter Date: 08/02/2017  PT End of Session - 08/02/17 2029    Visit Number  9    Number of Visits  18 1st visit for lymphedema    Date for PT Re-Evaluation  09/11/17 Re-eval required for lymphedema on 08/30/17    PT Start Time  1602    PT Stop Time  1712    PT Time Calculation (min)  70 min    Activity Tolerance  Patient tolerated treatment well    Behavior During Therapy  [facility] for tasks assessed/performed       Past Medical History:  Diagnosis Date  . Anxiety   . Arthritis    knee & back   . Blood type, Rh negative   . Breast cancer ([place])   . Chronic fatigue   . Depression   . Diverticulosis   . Endocervical polyp   . GERD (gastroesophageal reflux disease)   . Hypertension   . Hypothyroidism   . IBS (irritable bowel syndrome)   . Insomnia   . Lymphedema   . Neuromuscular disorder ([place])    Parkinson's disease   . Osteopenia 06/2015   T score -2.4 distal third of the radius  . Parkinson disease ([place])   . RBBB 10/07/2013  . Thyroid cancer [facility])     Past Surgical History:  Procedure Laterality Date  . APPENDECTOMY  1979  . BREAST SURGERY  1979   MASTECTOMY AND RECONSTRUCTION  . DILATION AND CURETTAGE OF UTERUS     MISCARRIAGE  . EYE SURGERY Bilateral    w/ IOL  . HYSTEROSCOPY  [id number]   AND D&C  . MASTECTOMY Bilateral   . THYROIDECTOMY    . TONSILLECTOMY    . TOTAL KNEE ARTHROPLASTY Right 10/07/2013   Procedure: RIGHT TOTAL KNEE ARTHROPLASTY;  Surgeon: Kerin Salen, MD;  Location: [facility];  Service: Orthopedics;  Laterality: Right;  . TUBAL LIGATION      There were no vitals filed for this visit.   Subjective Assessment - 08/02/17 1609    Subjective   Patient reports she has had right arm swelling for > 30 years. She has undergone complete decongestive therapy in [place] in previous years. Her arm was being well managed but worsened on 07/25/17 when he arm became infected. She began antibiotics on 07/25/17 and saw her MD on 07/28/17. She reports feeling bad overall with this infection. Her infection was caused by her husband scratching her skin as he was attempting to adjust her compression sleeve.    Patient is accompained by:  Family member    Pertinent History  Bilateral mastectomy 1979 with right ALND; anxiety, arthritis, depression, diverticulosis, GERD, HTN, IBS, osteopenia, R TKR; per Dr. Doristine Devoid notes-dementia    Patient Stated Goals  Get my arm back under control    Currently in Pain?  No/denies         [facility] PT Assessment - 08/02/17 0001      Assessment   Medical Diagnosis  Right arm lymphedema    Referring Provider  Dr. Wenda Low    Onset Date/Surgical Date  07/25/17 Date of most recent infection but lymphedema x40 years    Hand Dominance  Right    Prior Therapy  None recent  Precautions   Precautions  Other (comment)    Precaution Comments  Parkinson's, dementia,       Balance Screen   Has the patient fallen in the past 6 months  No    How many times?  -- None reported today    Has the patient had a decrease in activity level because of a fear of falling?   No    Is the patient reluctant to leave their home because of a fear of falling?   No      Home Environment   Living Environment  Assisted living    [place] - single point      Prior Function   Level of Independence  Independent with household mobility without device    Vocation  Retired    Leisure  Publishing copy and [place]   Overall Cognitive Status  Difficult to assess    Difficult to assess due to  -- Husband present and answered all questions      Observation/Other Assessments   Observations  Pitting  edema present anterior right forearm      Posture/Postural Control   Posture/Postural Control  Postural limitations    Postural Limitations  Rounded Shoulders;Forward head      ROM / Strength   AROM / PROM / Strength  AROM;Strength        LYMPHEDEMA/ONCOLOGY QUESTIONNAIRE - 08/02/17 1629      Type   Cancer Type  Right breast      Surgeries   Mastectomy Date  01/24/77    Axillary Lymph Node Dissection Date  01/24/77    Number Lymph Nodes Removed  -- Complete dissection      Treatment   Active Chemotherapy Treatment  No    Past Chemotherapy Treatment  No    Active Radiation Treatment  No    Past Radiation Treatment  No    Current Hormone Treatment  No    Past Hormone Therapy  No      What other symptoms do you have   Are you Having Heaviness or Tightness  No    Are you having Pain  No    Are you having pitting edema  Yes    Body Site  forearm on rt    Is it Hard or Difficult finding clothes that fit  No    Do you have infections  Yes    Comments  Has had many but most recent 07/25/17    Is there Decreased scar mobility  No    [place] Sign  No      Lymphedema Assessments   Lymphedema Assessments  Upper extremities      Right Upper Extremity Lymphedema   15 cm Proximal to Olecranon Process  27.6 cm    10 cm Proximal to Olecranon Process  26 cm    Olecranon Process  24 cm    15 cm Proximal to Ulnar Styloid Process  24 cm    10 cm Proximal to Ulnar Styloid Process  22 cm    Just Proximal to Ulnar Styloid Process  15.1 cm    Across Hand at [employer]  15.9 cm    At [place] of 2nd Digit  5.6 cm      Left Upper Extremity Lymphedema   15 cm Proximal to Olecranon Process  26.4 cm    10 cm Proximal to Olecranon Process  24.6 cm  Olecranon Process  21.5 cm    15 cm Proximal to Ulnar Styloid Process  20.3 cm    10 cm Proximal to Ulnar Styloid Process  17.4 cm    Just Proximal to Ulnar Styloid Process  13.8 cm    Across Hand at [employer]  16.8 cm    At [place] of  2nd Digit  5.8 cm             Outpatient Re[facility] from [facility]/02/[facility]  Lymphedema Life Impact Scale Total Score  13.24 %      Objective measurements completed on examination: See above findings.              PT Education - 08/02/17 2027    Education provided  Yes    Education Details  Significant amount of time spent educating pt on treatment plan and typical course of care.    Person(s) Educated  Patient;Spouse    Methods  Explanation    Comprehension  Verbalized understanding       PT Short Term Goals - 07/14/17 1413      PT SHORT TERM GOAL #1   Title  Pt will perform HEP with husband's supervision for improved balance and gait.    Baseline  Patient has HEP established, however has not been doing x 1 week due to death of a friend.    Time  4    Period  Weeks    Status  Partially Met    Target Date  07/07/17      PT SHORT TERM GOAL #2   Title  Pt will improve TUG score to less than or equal to 15 seconds for decreased fall risk.    Baseline  20.69 seconds without a device    Time  4    Period  Weeks    Status  Not Met      PT SHORT TERM GOAL #3   Title  Pt will improve postural control, as evidenced by ability to regain posterior balance in posterior push and release test, in 2 steps or less.    Baseline  Patient maintained balance without stepping.      Time  4    Period  Weeks    Status  Achieved      PT SHORT TERM GOAL #4   Title  Pt/husband will verbalize understanding of fall prevention in home environment.    Time  4    Period  Weeks    Status  New        PT Long Term Goals - 08/02/17 2101      PT LONG TERM GOAL  #9   TITLE  Patient will reduce Lymphedema Life Impact score to </= 8% functional limitation.    Time  4    Period  Weeks    Status  New             Plan - 08/02/17 2031    Clinical Impression Statement  Patient was worked in today at her husband request for exacerbation of  right arm lymphedema. She had a bilateral mastectomy with right axillary lymph node dissection in 1979. Since that time, she has had lymphedema in her right arm which was previously treated in [place]. It has been well controlled by her wearing a daytime garment and performing nightly bandaging (compression glove and wrapping arm) for many years. She has had multiple episodes of cellulitis over the years and mostly recently got  an infection on 07/25/17. This happened after she was scratched accidentally by her husband when he was trying to help don her compression sleeve. She will complete her antibiotic course on 08/03/17. She reports not feeling well since starting the antibiotics (unsteady, weak) but feels her infection has resolved. Her arm will benefit from complete decongetive therapy to reduce and control swelling. She will need a new compression sleeve and glove as her appears worn out. I anticipate her swelling will reduce fairly quickly as she has pitting edema. She is currently undergoing physical therapy at the [facility] for Parkinson's related issues and will continue that.    History and Personal Factors relevant to plan of care:  Multiple comorbidities; fall risk; dementia; frail with high risk for right arm infection    Clinical Presentation  Evolving    Clinical Presentation due to:  Complex medical history; Treating chronic lymphedema which has recently exaccerbated with infection and symptoms are new and changing    Clinical Decision Making  Moderate    Rehab Potential  Excellent    Clinical Impairments Affecting Rehab Potential  Dementia; required many reminders treatment plan    PT Frequency  3x / week    PT Duration  4 weeks    PT Treatment/Interventions  ADLs/Self Care Home Management;Manual lymph drainage;Manual techniques;Therapeutic activities;DME Instruction;Compression bandaging    PT Next Visit Plan  Begin compression bandaging and review self bandaging; manual lymph  drainage right arm    Consulted and Agree with Plan of Care  Patient;Family member/caregiver    Family Member Consulted  Husband       Patient will benefit from skilled therapeutic intervention in order to improve the following deficits and impairments:  Decreased knowledge of precautions, Impaired UE functional use, Decreased knowledge of use of DME, Increased edema  Visit Diagnosis: Postmastectomy lymphedema - Plan: PT plan of care cert/re-cert  Abnormal posture - Plan: PT plan of care cert/re-cert     Problem List Patient Active Problem List   Diagnosis Date Noted  . Parkinson's disease (tremor, stiffness, slow motion, unstable posture) ([place]) 11/13/2014  . Piriformis syndrome of left side 05/08/2014  . DDD (degenerative disc disease), lumbosacral 03/25/2014  . Abnormality of gait 03/25/2014  . Hypothyroidism 10/30/2013  . RBBB 10/07/2013  . Left shoulder pain 12/15/2011  . Postmenopause atrophic vaginitis 11/16/2011  . At high risk for falls 04/20/2011  . Blood type, Rh negative   . Thyroid cancer ([place])   . Endocervical polyp   . Osteoarthrosis, unspecified whether generalized or localized, involving lower leg 05/12/2009  . DEGENERATIVE DISC DISEASE, CERVICAL SPINE, W/RADICULOPATHY 04/30/2009  . BACK PAIN, THORACIC REGION, CHRONIC 04/30/2009  . DEPRESSION 11/14/2007  . Essential hypertension 11/14/2007  . CELLULITIS 11/14/2007  . BREAST CANCER, HX OF 11/14/2007  . THYROID CANCER, HX OF 11/14/2007   Annia Friendly, PT 08/02/17 9:05 PM  [place], [place], [address] Phone: [phone]   Fax:  [phone]  Name: AERON LHEUREUX MRN: [id number] Date of Birth: 22-Apr-1929

## 2017-08-04 DIAGNOSIS — C44529 Squamous cell carcinoma of skin of other part of trunk: Secondary | ICD-10-CM | POA: Diagnosis not present

## 2017-08-07 ENCOUNTER — Ambulatory Visit: Payer: BLUE CROSS/BLUE SHIELD | Admitting: Rehabilitation

## 2017-08-07 ENCOUNTER — Telehealth: Payer: Self-pay | Admitting: *Deleted

## 2017-08-07 ENCOUNTER — Encounter: Payer: Self-pay | Admitting: Rehabilitation

## 2017-08-07 ENCOUNTER — Encounter: Payer: Self-pay | Admitting: Rehabilitative and Restorative Service Providers"

## 2017-08-07 ENCOUNTER — Ambulatory Visit: Payer: BLUE CROSS/BLUE SHIELD | Admitting: Rehabilitative and Restorative Service Providers"

## 2017-08-07 VITALS — BP 118/63 | HR 78

## 2017-08-07 DIAGNOSIS — M6281 Muscle weakness (generalized): Secondary | ICD-10-CM

## 2017-08-07 DIAGNOSIS — R2681 Unsteadiness on feet: Secondary | ICD-10-CM

## 2017-08-07 DIAGNOSIS — I972 Postmastectomy lymphedema syndrome: Secondary | ICD-10-CM

## 2017-08-07 DIAGNOSIS — R293 Abnormal posture: Secondary | ICD-10-CM | POA: Diagnosis not present

## 2017-08-07 DIAGNOSIS — R2689 Other abnormalities of gait and mobility: Secondary | ICD-10-CM | POA: Diagnosis not present

## 2017-08-07 NOTE — Telephone Encounter (Addendum)
Pt sees [name] as well. It has been discussed in great detail with pt's husband the problems of having multiple neurologists involved in pt's care. I spoke to [place] regarding this call, and pt's husband advised her that they are not pleased with [name], which is why they are requesting to now see Dr. Jaynee Eagles. Please see phone note from 09/21/2016.

## 2017-08-07 NOTE — Telephone Encounter (Signed)
Patients spouse stopped by to request a doctor change.  Would like to possibly see Dr. Jaynee Eagles. Please call to discuss.

## 2017-08-07 NOTE — Therapy (Signed)
[place], [place], [address] Phone: [phone]   Fax:  [phone]  Physical Therapy Treatment  Patient Details  Name: Michelle Sims MRN: [id number] Date of Birth: 10-Jan-1930 Referring Provider: Dr. Wenda Low   Encounter Date: 08/07/2017  PT End of Session - 08/07/17 1719    Visit Number  11 2 for lymphedema    PT Start Time  5009    PT Stop Time  1605    PT Time Calculation (min)  50 min    Activity Tolerance  Patient tolerated treatment well    Behavior During Therapy  [facility] for tasks assessed/performed       Past Medical History:  Diagnosis Date  . Anxiety   . Arthritis    knee & back   . Blood type, Rh negative   . Breast cancer ([place])   . Chronic fatigue   . Depression   . Diverticulosis   . Endocervical polyp   . GERD (gastroesophageal reflux disease)   . Hypertension   . Hypothyroidism   . IBS (irritable bowel syndrome)   . Insomnia   . Lymphedema   . Neuromuscular disorder ([place])    Parkinson's disease   . Osteopenia 06/2015   T score -2.4 distal third of the radius  . Parkinson disease ([place])   . RBBB 10/07/2013  . Thyroid cancer [facility])     Past Surgical History:  Procedure Laterality Date  . APPENDECTOMY  1979  . BREAST SURGERY  1979   MASTECTOMY AND RECONSTRUCTION  . DILATION AND CURETTAGE OF UTERUS     MISCARRIAGE  . EYE SURGERY Bilateral    w/ IOL  . HYSTEROSCOPY  [id number]   AND D&C  . MASTECTOMY Bilateral   . THYROIDECTOMY    . TONSILLECTOMY    . TOTAL KNEE ARTHROPLASTY Right 10/07/2013   Procedure: RIGHT TOTAL KNEE ARTHROPLASTY;  Surgeon: Kerin Salen, MD;  Location: [place];  Service: Orthopedics;  Laterality: Right;  . TUBAL LIGATION      There were no vitals filed for this visit.  Subjective Assessment - 08/07/17 1522    Subjective  Pt arrives with bandages from home.  Reports she is good with self bandaging and requesting to take the bandages off  for at least a shower.  Also reports he blood pressure was high at neuro today and is still not feeling very well from the antibiotic    Pertinent History  Bilateral mastectomy 1979 with right ALND; anxiety, arthritis, depression, diverticulosis, GERD, HTN, IBS, osteopenia, R TKR; per Dr. Doristine Devoid notes-dementia    Currently in Pain?  No/denies       Treatment: Watched pt self wrap with glove and 6" bandage with a good job done.  She has done this x 20 years at home.  Performed MLD for the Rt UE in supine with 2 pillows.  Short neck, 5 deep breaths, gentle abdominal work, Lt axillary and Rt inguinal nodes, interaxillary and Rt axillo inguinal pathway established.  Then Rt UE from proximal to distal and retracing all steps.    Bandaged very lightly Rt UE: lotion, stockinette, 6", 8", and 10" bandages in spiral patterns.  Pt and husband aware that if they take the bandages off they should rewrap.  Also aware to call primary MD if they have BP concerns         Outpatient [facility]  Lymphedema Life Impact Scale Total Score  13.24 %                 PT Education - 08/07/17 1718    Education provided  Yes    Education Details  reasons for MLD, bandaging reasons and when to take them off    Person(s) Educated  Patient;Spouse    Methods  Explanation    Comprehension  Verbalized understanding        Plan - 08/07/17 1719    Clinical Impression Statement  Pt returns today reportins she just doesn't feel well either from the antibiotic and that her blood pressure has been high.  Her blood pressure here at start of streatment was 146/103 and at end 171/94.  Husband reporting this is high for her. They were instructed to call the MD with concerns if this was too high for her.  Pt agreeable to very light bandaging but not to learning MLD and seems to want to return to bandaging her own way as soon as possible.      Clinical  Impairments Affecting Rehab Potential  Dementia; required many reminders treatment plan    PT Treatment/Interventions  ADLs/Self Care Home Management;Therapeutic activities;Gait training;Neuromuscular re-education;Therapeutic exercise;Balance training;Patient/family education;Functional mobility training;Stair training;DME Instruction    PT Next Visit Plan  Lymph:  continue R UE MLD, light bandaging to the Rt UE, getting measured monday by [facility]?    Consulted and Agree with Plan of Care  Patient;Family member/caregiver       Patient will benefit from skilled therapeutic intervention in order to improve the following deficits and impairments:  Postural dysfunction, Abnormal gait, Decreased balance, Decreased strength, Difficulty walking, Decreased mobility, Decreased safety awareness  Visit Diagnosis: Postmastectomy lymphedema     Problem List Patient Active Problem List   Diagnosis Date Noted  . Parkinson's disease (tremor, stiffness, slow motion, unstable posture) ([place]) 11/13/2014  . Piriformis syndrome of left side 05/08/2014  . DDD (degenerative disc disease), lumbosacral 03/25/2014  . Abnormality of gait 03/25/2014  . Hypothyroidism 10/30/2013  . RBBB 10/07/2013  . Left shoulder pain 12/15/2011  . Postmenopause atrophic vaginitis 11/16/2011  . At high risk for falls 04/20/2011  . Blood type, Rh negative   . Thyroid cancer ([place])   . Endocervical polyp   . Osteoarthrosis, unspecified whether generalized or localized, involving lower leg 05/12/2009  . DEGENERATIVE DISC DISEASE, CERVICAL SPINE, W/RADICULOPATHY 04/30/2009  . BACK PAIN, THORACIC REGION, CHRONIC 04/30/2009  . DEPRESSION 11/14/2007  . Essential hypertension 11/14/2007  . CELLULITIS 11/14/2007  . BREAST CANCER, HX OF 11/14/2007  . THYROID CANCER, HX OF 11/14/2007    Shan Levans, PT 08/07/2017, 5:23 PM  [place], [place],  [address] Phone: [phone]   Fax:  [phone]  Name: Michelle Sims MRN: [id number] Date of Birth: 01/05/1930

## 2017-08-07 NOTE — Patient Instructions (Signed)
Stand with your back to the corner, but not touching the walls. Have a chair or spouse in front of you in case you lose your balance.   Feet Together, Head Motion - Eyes Open    With eyes open, feet together, move head slowly: up and down x 1 minute. Then move your head left and right x 1 minute.  Do __1__ sessions per day.  Copyright  VHI. All rights reserved.    Single Step: Forward / Backward    Lifting foot off floor, take one step slowly forward with right leg. Then swing leg back and take one step backward.  Repeat forward<>backward step on same leg  Repeat _10___ times per session. Do _1-2___ sessions per day. Repeat with other leg.  Copyright  VHI. All rights reserved.   [id number]

## 2017-08-07 NOTE — Therapy (Signed)
[place] [address] [place], [place], [address] Phone: [phone]   Fax:  [phone]  Physical Therapy Treatment and Progress Note  Patient Details  Name: Michelle Sims MRN: [id number] Date of Birth: [birth year] Referring Provider: Dr. Wenda Low   Encounter Date: 08/07/2017  PT End of Session - 08/07/17 1409    Visit Number  10 visit 68 of 108 for neuro rehab location    Number of Visits  18    Date for PT Re-Evaluation  09/11/17 Re-eval required for lymphedema on 08/30/17    Authorization Type  BCBS, Medicare    PT Start Time  1403    PT Stop Time  1448    PT Time Calculation (min)  45 min    Equipment Utilized During Treatment  Gait belt    Activity Tolerance  Patient tolerated treatment well    Behavior During Therapy  [facility] for tasks assessed/performed       Past Medical History:  Diagnosis Date  . Anxiety   . Arthritis    knee & back   . Blood type, Rh negative   . Breast cancer ([place])   . Chronic fatigue   . Depression   . Diverticulosis   . Endocervical polyp   . GERD (gastroesophageal reflux disease)   . Hypertension   . Hypothyroidism   . IBS (irritable bowel syndrome)   . Insomnia   . Lymphedema   . Neuromuscular disorder ([place])    Parkinson's disease   . Osteopenia 06/2015   T score -2.4 distal third of the radius  . Parkinson disease ([place])   . RBBB 10/07/2013  . Thyroid cancer [facility])     Past Surgical History:  Procedure Laterality Date  . APPENDECTOMY  1979  . BREAST SURGERY  1979   MASTECTOMY AND RECONSTRUCTION  . DILATION AND CURETTAGE OF UTERUS     MISCARRIAGE  . EYE SURGERY Bilateral    w/ IOL  . HYSTEROSCOPY  [id number]   AND D&C  . MASTECTOMY Bilateral   . THYROIDECTOMY    . TONSILLECTOMY    . TOTAL KNEE ARTHROPLASTY Right 10/07/2013   Procedure: RIGHT TOTAL KNEE ARTHROPLASTY;  Surgeon: Kerin Salen, MD;  Location: [place];  Service: Orthopedics;  Laterality: Right;  .  TUBAL LIGATION      Vitals:   08/07/17 1410 08/07/17 1416  BP: (!) 160/97 118/63  Pulse: 81 78    Subjective Assessment - 08/07/17 1522    Subjective  Pt arrives with bandages from home.  Reports she is good with self bandaging and requesting to take the bandages off for at least a shower    Pertinent History  Bilateral mastectomy 1979 with right ALND; anxiety, arthritis, depression, diverticulosis, GERD, HTN, IBS, osteopenia, R TKR; per Dr. Doristine Devoid notes-dementia    Currently in Pain?  No/denies          [facility] PT Assessment - 08/07/17 1445      Ambulation/Gait   Ambulation/Gait Assistance  4: Min guard;5: Supervision    Ambulation/Gait Assistance Details  PT provides demonstration + verbal cues for placement of SPC.  Patient tends to place more narrow in line of R LE stepping path, and needs cues to maintain greater width.  Also needs verbal cues for postural upright and longer stride.    Ambulation Distance (Feet)  230 Feet x 3 reps    Assistive device  Straight cane with quad tip    Gait Pattern  Step-through pattern;Decreased step length - right;Decreased dorsiflexion - right;Narrow base of support;Poor foot clearance - left;Poor foot clearance - right    Ambulation Surface  Level;Indoor    Gait velocity  1.72 ft/sec with SPC    Stairs  Yes    Stairs Assistance  5: Supervision    Stair Management Technique  One rail Left;With cane;Alternating pattern;Forwards    Number of Stairs  8    Ramp  -- Contact Guard Assist    Curb  4: Min assist hand held assist left hand    Gait Comments  Patient's husband arrived midway through session and PT reviewed with him sequencing of cane during gait and placement of cane.  Patient's husband was cuing patinet to use the cane to push off the ground and move it with the right leg.  PT demonstrated multiple times with husband present cane moving iwth left leg and placement of cane.        Timed Up and Go Test   TUG  -- 22.75 sec with SPC              [facility] Adult PT Treatment/Exercise - 08/07/17 1445      Transfers   Transfers  Sit to Stand;Stand to Sit    Sit to Stand  6: Modified independent (Device/Increase time)    Stand to Sit  6: Modified independent (Device/Increase time)      Ambulation/Gait   Ambulation/Gait  Yes      Self-Care   Self-Care  Other Self-Care Comments    Other Self-Care Comments   Demonstrated L hand held assist for safety on curbs to husband.  PT discussed steps and he reports she does not choose to go up and down steps at her home near dining room.  She chooses to use elevator.   Discussed guarding on curbs and gait sequence with husband.  The patient and her husband note she has not been doing any activity at home due to not feeling well.  PT encouraged frequent, short duration bouts of activity during the day working up to tolerating her HEP again.   Reprinted all HEP and plan to review next session.               PT Short Term Goals - 07/14/17 1413      PT SHORT TERM GOAL #1   Title  Pt will perform HEP with husband's supervision for improved balance and gait.    Baseline  Patient has HEP established, however has not been doing x 1 week due to death of a friend.    Time  4    Period  Weeks    Status  Partially Met    Target Date  07/07/17      PT SHORT TERM GOAL #2   Title  Pt will improve TUG score to less than or equal to 15 seconds for decreased fall risk.    Baseline  20.69 seconds without a device    Time  4    Period  Weeks    Status  Not Met      PT SHORT TERM GOAL #3   Title  Pt will improve postural control, as evidenced by ability to regain posterior balance in posterior push and release test, in 2 steps or less.    Baseline  Patient maintained balance without stepping.      Time  4    Period  Weeks    Status  Achieved  PT SHORT TERM GOAL #4   Title  Pt/husband will verbalize understanding of fall prevention in home environment.    Time  4    Period   Weeks    Status  New        PT Long Term Goals - 08/07/17 1418      PT LONG TERM GOAL #1   Title  Pt and husband will verbalize continued community fitness, including updated HEP for continued targeted work on balance and gait.  TARGET Date:  09/06/2017    Time  8    Period  Weeks    Status  Revised    Target Date  09/06/17      PT LONG TERM GOAL #2   Title  Pt will improve TUG score to less than or equal to 18 seconds for improving mobility.    Baseline  22.75 on 08/07/2017    Time  8    Period  Weeks    Status  Revised    Target Date  09/06/17      PT LONG TERM GOAL #3   Title  Pt will improve MiniBESTest score to at least 14/28 for decreased fall risk.    Time  8    Period  Weeks    Status  Revised    Target Date  09/06/17      PT LONG TERM GOAL #4   Title  Pt will ambulate at least 1000 ft over indoor/outdoor surfaces, with supervision and appropriate assistive device, for improved community gait.    Time  8    Period  Weeks    Status  Revised    Target Date  09/06/17            Plan - 08/07/17 1516    Clinical Impression Statement  The patient has been seen for a total of 5 weeks since evaluation on 5/22 (3 weeks @ 2x/week and 2 weeks @ 1x/week) due to travel and illness.  The patient has not been able to participate in HEP due to recent illness and is showing a decline in moiblity per TUG (was 17.59 at eval and is now 22.75) and per gait velocity (3.0 ft/sec down to 1.72 ft/sec).  PT discussed the need to return to activity in the home with her husband's assistance for safety. LTGs were modified to continue through 09/06/2017.    Rehab Potential  Good    PT Treatment/Interventions  ADLs/Self Care Home Management;Therapeutic activities;Gait training;Neuromuscular re-education;Therapeutic exercise;Balance training;Patient/family education;Functional mobility training;Stair training;DME Instruction    PT Next Visit Plan  *Next Neuro session:  Review HEP with husband's  assistance, encourage large amplitude movements.     Consulted and Agree with Plan of Care  Patient;Family member/caregiver    Family Member Consulted  Husband       Patient will benefit from skilled therapeutic intervention in order to improve the following deficits and impairments:  Postural dysfunction, Abnormal gait, Decreased balance, Decreased strength, Difficulty walking, Decreased mobility, Decreased safety awareness  Visit Diagnosis: Abnormal posture  Other abnormalities of gait and mobility  Unsteadiness on feet  Muscle weakness (generalized)    Physical Therapy Progress Note   Dates of Reporting Period:06/14/2017-08/07/2017    Reason Skilled Services are Required: PT to emphasize family education for improved carryover to home environment on gait/walking program, large amplitude movements and balance activities.   Thank you for the referral of this patient. Rudell Cobb, MPT    Danaka Llera 08/07/2017, 3:48 PM  [place]  [facility] [address] [place], [place], [address] Phone: [phone]   Fax:  [phone]  Name: DEMETRIC PARSLOW MRN: [id number] Date of Birth: 07-19-1929

## 2017-08-07 NOTE — Telephone Encounter (Signed)
Okay to change provider as per pt request.

## 2017-08-08 NOTE — Telephone Encounter (Signed)
I don't have anymore to offer her, she has already seen Dr. Rexene Alberts and [name] who are 2 movement specialists. I could not further her workup, need to decline. May consider movement specialist at [facility] per [name] or Dr. Rexene Alberts. Thanks.

## 2017-08-09 ENCOUNTER — Ambulatory Visit: Payer: BLUE CROSS/BLUE SHIELD | Admitting: Rehabilitation

## 2017-08-09 ENCOUNTER — Encounter: Payer: Self-pay | Admitting: Rehabilitation

## 2017-08-09 DIAGNOSIS — R2681 Unsteadiness on feet: Secondary | ICD-10-CM

## 2017-08-09 DIAGNOSIS — I972 Postmastectomy lymphedema syndrome: Secondary | ICD-10-CM | POA: Diagnosis not present

## 2017-08-09 DIAGNOSIS — M6281 Muscle weakness (generalized): Secondary | ICD-10-CM

## 2017-08-09 DIAGNOSIS — R293 Abnormal posture: Secondary | ICD-10-CM

## 2017-08-09 DIAGNOSIS — R2689 Other abnormalities of gait and mobility: Secondary | ICD-10-CM | POA: Diagnosis not present

## 2017-08-09 NOTE — Therapy (Addendum)
[place], [place], [address] Phone: [phone]   Fax:  [phone]  Physical Therapy Treatment  Patient Details  Name: Michelle Sims MRN: [id number] Date of Birth: 06-Jul-1929 Referring Provider: Dr. Wenda Low   Encounter Date: 08/09/2017  PT End of Session - 08/09/17 1403    Visit Number  12 3 for lymphedema    Number of Visits  17   Date for PT Re-Evaluation  09/11/17    Authorization Type  BCBS, Medicare    PT Start Time  1300    PT Stop Time  1355    PT Time Calculation (min)  55 min    Activity Tolerance  Patient tolerated treatment well    Behavior During Therapy  [facility] for tasks assessed/performed       Past Medical History:  Diagnosis Date  . Anxiety   . Arthritis    knee & back   . Blood type, Rh negative   . Breast cancer ([place])   . Chronic fatigue   . Depression   . Diverticulosis   . Endocervical polyp   . GERD (gastroesophageal reflux disease)   . Hypertension   . Hypothyroidism   . IBS (irritable bowel syndrome)   . Insomnia   . Lymphedema   . Neuromuscular disorder ([place])    Parkinson's disease   . Osteopenia 06/2015   T score -2.4 distal third of the radius  . Parkinson disease ([place])   . RBBB 10/07/2013  . Thyroid cancer [facility])     Past Surgical History:  Procedure Laterality Date  . APPENDECTOMY  1979  . BREAST SURGERY  1979   MASTECTOMY AND RECONSTRUCTION  . DILATION AND CURETTAGE OF UTERUS     MISCARRIAGE  . EYE SURGERY Bilateral    w/ IOL  . HYSTEROSCOPY  [id number]   AND D&C  . MASTECTOMY Bilateral   . THYROIDECTOMY    . TONSILLECTOMY    . TOTAL KNEE ARTHROPLASTY Right 10/07/2013   Procedure: RIGHT TOTAL KNEE ARTHROPLASTY;  Surgeon: Kerin Salen, MD;  Location: [place];  Service: Orthopedics;  Laterality: Right;  . TUBAL LIGATION      There were no vitals filed for this visit.  Subjective Assessment - 08/09/17 1308    Subjective  Kept the bandage  on until last night then took a shower.  Then husband rewrapped her arm.  Arrives with bandages on today but loose and falling off.      Pertinent History  Bilateral mastectomy 1979 with right ALND; anxiety, arthritis, depression, diverticulosis, GERD, HTN, IBS, osteopenia, R TKR; per Dr. Doristine Devoid notes-dementia    Currently in Pain?  No/denies            LYMPHEDEMA/ONCOLOGY QUESTIONNAIRE - 08/09/17 1309      Right Upper Extremity Lymphedema   10 cm Proximal to Olecranon Process  26 cm    Olecranon Process  23.5 cm    15 cm Proximal to Ulnar Styloid Process  22.5 cm    10 cm Proximal to Ulnar Styloid Process  20 cm    Just Proximal to Ulnar Styloid Process  15.3 cm    Across Hand at [employer]  16.3 cm    At [place] of 2nd Digit  5.7 cm           Outpatient Rehab from [facility]/02/2017 [facility]  Lymphedema Life Impact Scale Total Score  13.24 %  [facility] Adult PT Treatment/Exercise - 08/09/17 0001      Manual Therapy   Manual Therapy  Manual Lymphatic Drainage (MLD);Compression Bandaging    Manual Lymphatic Drainage (MLD)  in supine with 2 pillows.  Short neck, 5 deep breaths, gentle abdominal work, Lt axillary and Rt inguinal nodes, interaxillary and Rt axillo inguinal pathway established.  Then Rt UE from proximal to distal and retracing all steps    Compression Bandaging  lotion, stockinette, 6", 8", and 10" bandages in spiral patterns.  Due to some PT concerns about hand tightness PTA re-wrapped due to time constraints. Pt and husband aware that if they take the bandages off they should rewrap.                PT Short Term Goals - 07/14/17 1413      PT SHORT TERM GOAL #1   Title  Pt will perform HEP with husband's supervision for improved balance and gait.    Baseline  Patient has HEP established, however has not been doing x 1 week due to death of a friend.    Time  4    Period  Weeks    Status  Partially Met     Target Date  07/07/17      PT SHORT TERM GOAL #2   Title  Pt will improve TUG score to less than or equal to 15 seconds for decreased fall risk.    Baseline  20.69 seconds without a device    Time  4    Period  Weeks    Status  Not Met      PT SHORT TERM GOAL #3   Title  Pt will improve postural control, as evidenced by ability to regain posterior balance in posterior push and release test, in 2 steps or less.    Baseline  Patient maintained balance without stepping.      Time  4    Period  Weeks    Status  Achieved      PT SHORT TERM GOAL #4   Title  Pt/husband will verbalize understanding of fall prevention in home environment.    Time  4    Period  Weeks    Status  New        PT Long Term Goals - 08/07/17 1418      PT LONG TERM GOAL #1   Title  Pt and husband will verbalize continued community fitness, including updated HEP for continued targeted work on balance and gait.  TARGET Date:  09/06/2017    Time  8    Period  Weeks    Status  Revised    Target Date  09/06/17      PT LONG TERM GOAL #2   Title  Pt will improve TUG score to less than or equal to 18 seconds for improving mobility.    Baseline  22.75 on 08/07/2017    Time  8    Period  Weeks    Status  Revised    Target Date  09/06/17      PT LONG TERM GOAL #3   Title  Pt will improve MiniBESTest score to at least 14/28 for decreased fall risk.    Time  8    Period  Weeks    Status  Revised    Target Date  09/06/17      PT LONG TERM GOAL #4   Title  Pt will ambulate at least 1000 ft over indoor/outdoor surfaces,  with supervision and appropriate assistive device, for improved community gait.    Time  8    Period  Weeks    Status  Revised    Target Date  09/06/17            Plan - 08/09/17 1403    Clinical Impression Statement  Pt returns today with about 2cm reduction in forearm and no change in hand and upper arm.  Pt left the garments on about 24 hours and then pt's husband rewrapped them but  arrived with them very loose.  Pt would like to obtain night garment and new glove with sleeve if possible.     Clinical Impairments Affecting Rehab Potential  Dementia; required many reminders treatment plan    PT Frequency  3x / week    PT Duration  4 weeks    PT Treatment/Interventions  ADLs/Self Care Home Management;Therapeutic activities;Gait training;Neuromuscular re-education;Therapeutic exercise;Balance training;Patient/family education;Functional mobility training;Stair training;DME Instruction    PT Next Visit Plan  Lymph:  continue R UE MLD, light bandaging to the Rt UE, getting measured monday by [facility]?       Patient will benefit from skilled therapeutic intervention in order to improve the following deficits and impairments:  Postural dysfunction, Abnormal gait, Decreased balance, Decreased strength, Difficulty walking, Decreased mobility, Decreased safety awareness  Visit Diagnosis: Postmastectomy lymphedema  Abnormal posture  Other abnormalities of gait and mobility  Unsteadiness on feet  Muscle weakness (generalized)     Problem List Patient Active Problem List   Diagnosis Date Noted  . Parkinson's disease (tremor, stiffness, slow motion, unstable posture) ([place]) 11/13/2014  . Piriformis syndrome of left side 05/08/2014  . DDD (degenerative disc disease), lumbosacral 03/25/2014  . Abnormality of gait 03/25/2014  . Hypothyroidism 10/30/2013  . RBBB 10/07/2013  . Left shoulder pain 12/15/2011  . Postmenopause atrophic vaginitis 11/16/2011  . At high risk for falls 04/20/2011  . Blood type, Rh negative   . Thyroid cancer ([place])   . Endocervical polyp   . Osteoarthrosis, unspecified whether generalized or localized, involving lower leg 05/12/2009  . DEGENERATIVE DISC DISEASE, CERVICAL SPINE, W/RADICULOPATHY 04/30/2009  . BACK PAIN, THORACIC REGION, CHRONIC 04/30/2009  . DEPRESSION 11/14/2007  . Essential hypertension 11/14/2007  . CELLULITIS 11/14/2007  .  BREAST CANCER, HX OF 11/14/2007  . THYROID CANCER, HX OF 11/14/2007    Shan Levans, PT 08/09/2017, 2:06 PM  [place], [place], [address] Phone: [phone]   Fax:  [phone]  Name: Michelle Sims MRN: [id number] Date of Birth: [birth year]

## 2017-08-11 ENCOUNTER — Other Ambulatory Visit: Payer: Self-pay

## 2017-08-11 ENCOUNTER — Ambulatory Visit: Payer: BLUE CROSS/BLUE SHIELD | Admitting: Physical Therapy

## 2017-08-11 ENCOUNTER — Encounter: Payer: Self-pay | Admitting: Physical Therapy

## 2017-08-11 DIAGNOSIS — M6281 Muscle weakness (generalized): Secondary | ICD-10-CM

## 2017-08-11 DIAGNOSIS — R2681 Unsteadiness on feet: Secondary | ICD-10-CM

## 2017-08-11 DIAGNOSIS — R293 Abnormal posture: Secondary | ICD-10-CM | POA: Diagnosis not present

## 2017-08-11 DIAGNOSIS — I972 Postmastectomy lymphedema syndrome: Secondary | ICD-10-CM | POA: Diagnosis not present

## 2017-08-11 DIAGNOSIS — R2689 Other abnormalities of gait and mobility: Secondary | ICD-10-CM

## 2017-08-11 NOTE — Therapy (Addendum)
[place], [place], [address] Phone: [phone]   Fax:  [phone]  Physical Therapy Treatment  Patient Details  Name: Michelle Sims MRN: [id number] Date of Birth: 1929-11-22 Referring Provider: Dr. Wenda Low   Encounter Date: 08/11/2017  PT End of Session - 08/11/17 1200    Visit Number  52 (4th for lymphedema, 9th for Neuro)   Number of Visits  17   Date for PT Re-Evaluation  09/11/17    Authorization Type  BCBS, Medicare    PT Start Time  1103    PT Stop Time  1200    PT Time Calculation (min)  57 min    Activity Tolerance  Patient tolerated treatment well    Behavior During Therapy  [facility] for tasks assessed/performed       Past Medical History:  Diagnosis Date  . Anxiety   . Arthritis    knee & back   . Blood type, Rh negative   . Breast cancer ([place])   . Chronic fatigue   . Depression   . Diverticulosis   . Endocervical polyp   . GERD (gastroesophageal reflux disease)   . Hypertension   . Hypothyroidism   . IBS (irritable bowel syndrome)   . Insomnia   . Lymphedema   . Neuromuscular disorder ([place])    Parkinson's disease   . Osteopenia 06/2015   T score -2.4 distal third of the radius  . Parkinson disease ([place])   . RBBB 10/07/2013  . Thyroid cancer [facility])     Past Surgical History:  Procedure Laterality Date  . APPENDECTOMY  1979  . BREAST SURGERY  1979   MASTECTOMY AND RECONSTRUCTION  . DILATION AND CURETTAGE OF UTERUS     MISCARRIAGE  . EYE SURGERY Bilateral    w/ IOL  . HYSTEROSCOPY  [id number]   AND D&C  . MASTECTOMY Bilateral   . THYROIDECTOMY    . TONSILLECTOMY    . TOTAL KNEE ARTHROPLASTY Right 10/07/2013   Procedure: RIGHT TOTAL KNEE ARTHROPLASTY;  Surgeon: Kerin Salen, MD;  Location: [facility];  Service: Orthopedics;  Laterality: Right;  . TUBAL LIGATION      There were no vitals filed for this visit.  Subjective Assessment - 08/11/17 1104    Subjective   My bandages came off this morning.  I feel like my arm is doing a little better. I'm starting to feel like I'm getting my strength back from being on that antibiotic.    Patient is accompained by:  Family member    Pertinent History  Bilateral mastectomy 1979 with right ALND; anxiety, arthritis, depression, diverticulosis, GERD, HTN, IBS, osteopenia, R TKR; per Dr. Doristine Devoid notes-dementia    Patient Stated Goals  Get my arm back under control    Currently in Pain?  No/denies            LYMPHEDEMA/ONCOLOGY QUESTIONNAIRE - 08/11/17 1111      Right Upper Extremity Lymphedema   15 cm Proximal to Olecranon Process  28.1 cm    10 cm Proximal to Olecranon Process  25.8 cm    Olecranon Process  22.8 cm    15 cm Proximal to Ulnar Styloid Process  22.8 cm    10 cm Proximal to Ulnar Styloid Process  20.7 cm    Just Proximal to Ulnar Styloid Process  14.2 cm    Across Hand at [employer]  15.5 cm    At [place] of 2nd  Digit  5.7 cm           Outpatient Re[facility]02/[facility]  Lymphedema Life Impact Scale Total Score  13.24 %           [facility] Adult PT Treatment/Exercise - 08/11/17 0001      Manual Therapy   Manual Therapy  Manual Lymphatic Drainage (MLD);Compression Bandaging    Manual therapy comments  Measured right UE to determine progress    Manual Lymphatic Drainage (MLD)  in supine with 2 pillows.  Short neck, 5 deep breaths, gentle abdominal work, Lt axillary and Rt inguinal nodes, interaxillary and Rt axillo inguinal pathway established.  Then Rt UE from proximal to distal and retracing all steps    Compression Bandaging  Right UE: stockinette after lotion; Artiflex on arm; 3 short stretch compression bandages from hand to axilla.             PT Education - 08/11/17 1159    Education provided  Yes    Education Details  Encouraged pt to keep bandages on as long as she can and then shower and then rewrap.    Person(s)  Educated  Patient;Spouse    Methods  Explanation    Comprehension  Verbalized understanding       PT Short Term Goals - 07/14/17 1413      PT SHORT TERM GOAL #1   Title  Pt will perform HEP with husband's supervision for improved balance and gait.    Baseline  Patient has HEP established, however has not been doing x 1 week due to death of a friend.    Time  4    Period  Weeks    Status  Partially Met    Target Date  07/07/17      PT SHORT TERM GOAL #2   Title  Pt will improve TUG score to less than or equal to 15 seconds for decreased fall risk.    Baseline  20.69 seconds without a device    Time  4    Period  Weeks    Status  Not Met      PT SHORT TERM GOAL #3   Title  Pt will improve postural control, as evidenced by ability to regain posterior balance in posterior push and release test, in 2 steps or less.    Baseline  Patient maintained balance without stepping.      Time  4    Period  Weeks    Status  Achieved      PT SHORT TERM GOAL #4   Title  Pt/husband will verbalize understanding of fall prevention in home environment.    Time  4    Period  Weeks    Status  New        PT Long Term Goals - 08/07/17 1418      PT LONG TERM GOAL #1   Title  Pt and husband will verbalize continued community fitness, including updated HEP for continued targeted work on balance and gait.  TARGET Date:  09/06/2017    Time  8    Period  Weeks    Status  Revised    Target Date  09/06/17      PT LONG TERM GOAL #2   Title  Pt will improve TUG score to less than or equal to 18 seconds for improving mobility.    Baseline  22.75 on 08/07/2017    Time  8  Period  Weeks    Status  Revised    Target Date  09/06/17      PT LONG TERM GOAL #3   Title  Pt will improve MiniBESTest score to at least 14/28 for decreased fall risk.    Time  8    Period  Weeks    Status  Revised    Target Date  09/06/17      PT LONG TERM GOAL #4   Title  Pt will ambulate at least 1000 ft over  indoor/outdoor surfaces, with supervision and appropriate assistive device, for improved community gait.    Time  8    Period  Weeks    Status  Revised    Target Date  09/06/17            Plan - 08/11/17 1200    Clinical Impression Statement Patient appears to be doing well with her right arm. There is some concern about considering a velcro wrap for night time instead of bandaging as this is what she's done for many years. With her declining function with parkinson's, it seems getting something easy to apply as a night time garment will be a better option for her and her husband. They discussed at length with the PT their concerns about her depression and how they feel (especially her husband feels) that it is impeding her progress with physical therapy (Neuro PT). They were encouraged to discuss those concerns witih Neuro PT and their referring MD. We talked at length about how the diagnosis of a chronic illness is difficult and requires lifestyle changes. We discussed the importance of participation in regular exercise classes at [place] where they lives and came up with strategies to participate.   Rehab Potential  Good    Clinical Impairments Affecting Rehab Potential  Dementia; required many reminders treatment plan    PT Frequency  3x / week    PT Duration  4 weeks    PT Treatment/Interventions  ADLs/Self Care Home Management;Therapeutic activities;manual lymph drainage; compression bandaging; Patient/family education; DME Instruction    PT Next Visit Plan  Continue manual lymph drainage and bandaging; will get fitted next visit with fitter after PT    Consulted and Agree with Plan of Care  Patient;Family member/caregiver    Family Member Consulted  Husband       Patient will benefit from skilled therapeutic intervention in order to improve the following deficits and impairments:  Postural dysfunction, Decreased mobility, Increased edema, Decreased knowledge of  precautions  Visit Diagnosis: Postmastectomy lymphedema  Abnormal posture     Problem List Patient Active Problem List   Diagnosis Date Noted  . Parkinson's disease (tremor, stiffness, slow motion, unstable posture) ([place]) 11/13/2014  . Piriformis syndrome of left side 05/08/2014  . DDD (degenerative disc disease), lumbosacral 03/25/2014  . Abnormality of gait 03/25/2014  . Hypothyroidism 10/30/2013  . RBBB 10/07/2013  . Left shoulder pain 12/15/2011  . Postmenopause atrophic vaginitis 11/16/2011  . At high risk for falls 04/20/2011  . Blood type, Rh negative   . Thyroid cancer ([place])   . Endocervical polyp   . Osteoarthrosis, unspecified whether generalized or localized, involving lower leg 05/12/2009  . DEGENERATIVE DISC DISEASE, CERVICAL SPINE, W/RADICULOPATHY 04/30/2009  . BACK PAIN, THORACIC REGION, CHRONIC 04/30/2009  . DEPRESSION 11/14/2007  . Essential hypertension 11/14/2007  . CELLULITIS 11/14/2007  . BREAST CANCER, HX OF 11/14/2007  . THYROID CANCER, HX OF 11/14/2007   Annia Friendly, PT 08/12/17 9:34 AM  [place], [place], [address] Phone: [phone]   Fax:  [phone]  Name: Michelle Sims MRN: [id number] Date of Birth: 08-05-1929

## 2017-08-11 NOTE — Therapy (Addendum)
[facility] [address] [place], [place], [address] Phone: [phone]   Fax:  [phone]  Physical Therapy Treatment And 10th visit progress note This note covers progress between the periods of 06/13/17 to 08/11/17  Patient Details  Name: Michelle Sims MRN: [id number] Date of Birth: [birth year] Referring Provider: Dr. Wenda Low   Encounter Date: 08/11/2017  PT End of Session - 08/11/17 1610    Visit Number  14 4 for lymphedema, 10 for neuro    Number of Visits  17    Date for PT Re-Evaluation  09/11/17    Authorization Type  BCBS, Medicare  VL 90 (pt,ot, slp)    Authorization - Visit Number  14 combined # visits neuro & lymphedema    Authorization - Number of Visits  90    PT Start Time  1455    PT Stop Time  1540    PT Time Calculation (min)  45 min    Equipment Utilized During Treatment  --    Activity Tolerance  Patient tolerated treatment well    Behavior During Therapy  WFL for tasks assessed/performed       Past Medical History:  Diagnosis Date  . Anxiety   . Arthritis    knee & back   . Blood type, Rh negative   . Breast cancer ([place])   . Chronic fatigue   . Depression   . Diverticulosis   . Endocervical polyp   . GERD (gastroesophageal reflux disease)   . Hypertension   . Hypothyroidism   . IBS (irritable bowel syndrome)   . Insomnia   . Lymphedema   . Neuromuscular disorder ([place])    Parkinson's disease   . Osteopenia 06/2015   T score -2.4 distal third of the radius  . Parkinson disease ([place])   . RBBB 10/07/2013  . Thyroid cancer [facility])     Past Surgical History:  Procedure Laterality Date  . APPENDECTOMY  1979  . BREAST SURGERY  1979   MASTECTOMY AND RECONSTRUCTION  . DILATION AND CURETTAGE OF UTERUS     MISCARRIAGE  . EYE SURGERY Bilateral    w/ IOL  . HYSTEROSCOPY  [id number]   AND D&C  . MASTECTOMY Bilateral   . THYROIDECTOMY    . TONSILLECTOMY    . TOTAL KNEE  ARTHROPLASTY Right 10/07/2013   Procedure: RIGHT TOTAL KNEE ARTHROPLASTY;  Surgeon: Kerin Salen, MD;  Location: [facility];  Service: Orthopedics;  Laterality: Right;  . TUBAL LIGATION      There were no vitals filed for this visit.  Subjective Assessment - 08/11/17 1558    Subjective  Very tired after having lymphedema PT treatment earlier today.     Patient is accompained by:  Family member husband     Pertinent History  Bilateral mastectomy 1979 with right ALND; anxiety, arthritis, depression, diverticulosis, GERD, HTN, IBS, osteopenia, R TKR; per Dr. Doristine Devoid notes-dementia    Patient Stated Goals  Pt's goals for therapy for improved mobility and balance. Less fear of falling    Currently in Pain?  No/denies         Treatment- (see also Education) Ther-ex- Assessed pt's understanding of HEP by having her use her handouts to demonstrate. Updated wording on handouts for clarification. walking sideways at counter 5 lengths; forward marching x 3 lengths; backward walking x 3 lengths (close guarding due to pt anxious); sit to stand without use of UEs x 10;  Staggered stance with weight  shift and step forward, each leg In // bars, practice step up onto 4" platform and off the other side with single UE support (RUE where she uses her cane)multiple reps with pt doing without anxiety; progressed to 6" platform again with RUE on // bar with good confidence, progressed to 6" platform inside // bars, but using her cane in RUE and pt immediately anxious with hesitation and required LUE support.   Neuro-re-ed- (also part of pt's HEP) standing in corner balance exercises. Patient with minor instability and recovery with feet together, EO, head turns vertical and horizontal. Attempted feet in partial heel-toe and this caused increased imbalance with incr anxiety, therefore did not change her HEP  Gait training-with SPC with rubber quad tip with vc for step length, upright posture (looking forward not at her  feet), cane placement. Minguard assist for gait on level surfaces. Also repeated multiple reps on 4 steps with rail and cane (required minguard), curb with cane and light HHA (min assist), and ramp with cane and minguard (no imbalance, but very anxious).                         PT Education - 08/11/17 1559    Education Details  HEP--pt using pictures to complete exercises with correct technique, however confused over how many reps and sets and times per day and times per week. (Updated on her copies of the exercises to clarify). Sequencing for up/down steps, curb and ramp as she continues to have difficulty with these per her husband    Person(s) Educated  Patient;Spouse    Methods  Explanation;Demonstration;Handout;Verbal cues    Comprehension  Verbalized understanding;Returned demonstration;Verbal cues required       PT Short Term Goals - 07/14/17 1413      PT SHORT TERM GOAL #1   Title  Pt will perform HEP with husband's supervision for improved balance and gait.    Baseline  Patient has HEP established, however has not been doing x 1 week due to death of a friend.    Time  4    Period  Weeks    Status  Partially Met    Target Date  07/07/17      PT SHORT TERM GOAL #2   Title  Pt will improve TUG score to less than or equal to 15 seconds for decreased fall risk.    Baseline  20.69 seconds without a device    Time  4    Period  Weeks    Status  Not Met      PT SHORT TERM GOAL #3   Title  Pt will improve postural control, as evidenced by ability to regain posterior balance in posterior push and release test, in 2 steps or less.    Baseline  Patient maintained balance without stepping.      Time  4    Period  Weeks    Status  Achieved      PT SHORT TERM GOAL #4   Title  Pt/husband will verbalize understanding of fall prevention in home environment.    Time  4    Period  Weeks    Status  New        PT Long Term Goals - 08/07/17 1418      PT LONG TERM  GOAL #1   Title  Pt and husband will verbalize continued community fitness, including updated HEP for continued targeted work on balance and gait.  TARGET Date:  09/06/2017    Time  8    Period  Weeks    Status  Revised    Target Date  09/06/17      PT LONG TERM GOAL #2   Title  Pt will improve TUG score to less than or equal to 18 seconds for improving mobility.    Baseline  22.75 on 08/07/2017    Time  8    Period  Weeks    Status  Revised    Target Date  09/06/17      PT LONG TERM GOAL #3   Title  Pt will improve MiniBESTest score to at least 14/28 for decreased fall risk.    Time  8    Period  Weeks    Status  Revised    Target Date  09/06/17      PT LONG TERM GOAL #4   Title  Pt will ambulate at least 1000 ft over indoor/outdoor surfaces, with supervision and appropriate assistive device, for improved community gait.    Time  8    Period  Weeks    Status  Revised    Target Date  09/06/17            Plan - 08/11/17 1615    Clinical Impression Statement  Session started late (but was able to run late) due to issue with therapist's prior patient. Attempted to engage pt's husband in how patient is doing with her HEP and he states "she's doing well and does them on her own." Patient agrees and states he is frequently gone, so she does them when he is gone (also because she does not want to bother him when he gets home). Using her handouts, she actually was able to read the instructions on each and perform technique correctly. She did not understand how many reps, sets, days to do for each exercise. (Updated on her handout to simplify). Then focused on gait and safety with stairs, ramp and curb per husband's request. End of session husband inquiring about how we will know when she has achieved maximum benefit from therapy and if she will be ready to discharge at the end of next week. Her course of care has been interrupted due to illness, however feel the progress she will be able  to make is dependent on whether she does her HEP. Explained to husband that she is unable to understand how many reps, sets, etc and therefore with pt doing the exercises on her own, I have simplified how many and how often she should do her exercises. He is concerned it is not enough to help her improve. I explained I think she would be overwhelmed if we give her too much to do and then she would not do anything (pt agreed). Compromised by instructing pt/husband that if she wants to do more, she can always do the entire program a second time later in the day OR choose a couple of exercises to repeat later in the day (it doesn't have to be every exercise that she repeats). Noted evaluating therapist will see pt next visit and can further assess if patient is ready for discharge vs recertify for additional visits.     Rehab Potential  Good    Clinical Impairments Affecting Rehab Potential  hx of mild dementia; husband present for eval and appears supportive    PT Frequency  2x / week    PT Duration  8 weeks includes eval week  PT Treatment/Interventions  ADLs/Self Care Home Management;DME Instruction;Balance training;Therapeutic exercise;Therapeutic activities;Functional mobility training;Stair training;Gait training;Neuromuscular re-education;Patient/family education    PT Next Visit Plan  Begin checking LTGs to help establish if pt should d/c this week or re-certify. If time continue with:    Husband requests continued practice with curb, ramp, stairs with cane.  Educate husband on how to help guard patient, gait training with cane, HEP progression, work on improving power of movement for functional activities    Consulted and Agree with Plan of Care  Patient    Family Member Consulted          Patient will benefit from skilled therapeutic intervention in order to improve the following deficits and impairments:  Abnormal gait, Decreased balance, Decreased safety awareness, Decreased strength,  Difficulty walking, Decreased mobility, Postural dysfunction  Visit Diagnosis: Other abnormalities of gait and mobility  Unsteadiness on feet  Muscle weakness (generalized)     Problem List Patient Active Problem List   Diagnosis Date Noted  . Parkinson's disease (tremor, stiffness, slow motion, unstable posture) ([place]) 11/13/2014  . Piriformis syndrome of left side 05/08/2014  . DDD (degenerative disc disease), lumbosacral 03/25/2014  . Abnormality of gait 03/25/2014  . Hypothyroidism 10/30/2013  . RBBB 10/07/2013  . Left shoulder pain 12/15/2011  . Postmenopause atrophic vaginitis 11/16/2011  . At high risk for falls 04/20/2011  . Blood type, Rh negative   . Thyroid cancer ([place])   . Endocervical polyp   . Osteoarthrosis, unspecified whether generalized or localized, involving lower leg 05/12/2009  . DEGENERATIVE DISC DISEASE, CERVICAL SPINE, W/RADICULOPATHY 04/30/2009  . BACK PAIN, THORACIC REGION, CHRONIC 04/30/2009  . DEPRESSION 11/14/2007  . Essential hypertension 11/14/2007  . CELLULITIS 11/14/2007  . BREAST CANCER, HX OF 11/14/2007  . THYROID CANCER, HX OF 11/14/2007    Rexanne Mano, PT 08/11/2017, 4:45 PM  [facility] [address] [place], [place], [address] Phone: [phone]   Fax:  [phone]  Name: Michelle Sims MRN: [id number] Date of Birth: 20-Apr-1929

## 2017-08-14 ENCOUNTER — Ambulatory Visit: Payer: BLUE CROSS/BLUE SHIELD | Admitting: Physical Therapy

## 2017-08-14 ENCOUNTER — Other Ambulatory Visit: Payer: Self-pay

## 2017-08-14 ENCOUNTER — Encounter: Payer: Self-pay | Admitting: Physical Therapy

## 2017-08-14 DIAGNOSIS — R2689 Other abnormalities of gait and mobility: Secondary | ICD-10-CM | POA: Diagnosis not present

## 2017-08-14 DIAGNOSIS — R293 Abnormal posture: Secondary | ICD-10-CM | POA: Diagnosis not present

## 2017-08-14 DIAGNOSIS — I972 Postmastectomy lymphedema syndrome: Secondary | ICD-10-CM | POA: Diagnosis not present

## 2017-08-14 DIAGNOSIS — R2681 Unsteadiness on feet: Secondary | ICD-10-CM | POA: Diagnosis not present

## 2017-08-14 DIAGNOSIS — M6281 Muscle weakness (generalized): Secondary | ICD-10-CM | POA: Diagnosis not present

## 2017-08-14 NOTE — Patient Instructions (Signed)
Stand with your back to the corner, but not touching the walls. Have a chair or spouse in front of you in case you lose your balance.  Feet Together, Head Motion - Eyes Open    With eyes open, feet together, move head slowly: up and down x 1 minute. Then move your head left and right x 1 minute. Do __1__ sessions per day. Do this every day.

## 2017-08-14 NOTE — Therapy (Addendum)
[place], [place], [address] Phone: [phone]   Fax:  [phone]  Physical Therapy Treatment  Patient Details  Name: Michelle Sims MRN: [id number] Date of Birth: 23-Dec-1929 Referring Provider: Dr. Benita Stabile   Encounter Date: 08/14/2017  PT End of Session - 08/14/17 1653    Visit Number  15 5th for lymphedema    PT Start Time  1510    PT Stop Time  1640    PT Time Calculation (min)  90 min    Activity Tolerance  Patient tolerated treatment well Very fatigued at end of treatment    Behavior During Therapy  [facility] for tasks assessed/performed       Past Medical History:  Diagnosis Date  . Anxiety   . Arthritis    knee & back   . Blood type, Rh negative   . Breast cancer ([place])   . Chronic fatigue   . Depression   . Diverticulosis   . Endocervical polyp   . GERD (gastroesophageal reflux disease)   . Hypertension   . Hypothyroidism   . IBS (irritable bowel syndrome)   . Insomnia   . Lymphedema   . Neuromuscular disorder ([place])    Parkinson's disease   . Osteopenia 06/2015   T score -2.4 distal third of the radius  . Parkinson disease ([place])   . RBBB 10/07/2013  . Thyroid cancer [facility])     Past Surgical History:  Procedure Laterality Date  . APPENDECTOMY  1979  . BREAST SURGERY  1979   MASTECTOMY AND RECONSTRUCTION  . DILATION AND CURETTAGE OF UTERUS     MISCARRIAGE  . EYE SURGERY Bilateral    w/ IOL  . HYSTEROSCOPY  [id number]   AND D&C  . MASTECTOMY Bilateral   . THYROIDECTOMY    . TONSILLECTOMY    . TOTAL KNEE ARTHROPLASTY Right 10/07/2013   Procedure: RIGHT TOTAL KNEE ARTHROPLASTY;  Surgeon: Kerin Salen, MD;  Location: [place];  Service: Orthopedics;  Laterality: Right;  . TUBAL LIGATION      There were no vitals filed for this visit.  Subjective Assessment - 08/14/17 1546    Subjective  I think my arm is alot better. I kept the bandages on except I had to rewrap the  last one.    Patient is accompained by:  Family member    Pertinent History  Bilateral mastectomy 1979 with right ALND; anxiety, arthritis, depression, diverticulosis, GERD, HTN, IBS, osteopenia, R TKR; per Dr. Doristine Devoid notes-dementia    Patient Stated Goals  reduce right arm swelling and get new garments.    Currently in Pain?  No/denies            LYMPHEDEMA/ONCOLOGY QUESTIONNAIRE - 08/14/17 1548      Right Upper Extremity Lymphedema   10 cm Proximal to Olecranon Process  25.4 cm  (Pended)     Olecranon Process  22.4 cm  (Pended)     15 cm Proximal to Ulnar Styloid Process  23.1 cm  (Pended)     10 cm Proximal to Ulnar Styloid Process  20.8 cm  (Pended)     Just Proximal to Ulnar Styloid Process  13.9 cm  (Pended)     Across Hand at [employer]  15.7 cm  (Pended)     At [place] of 2nd Digit  5.6 cm  (Pended)            Outpatient Rehab from 08/02/2017 in [facility] [facility]  Lymphedema Life Impact Scale Total Score  13.24 %           [facility] Adult PT Treatment/Exercise - 08/14/17 0001      Manual Therapy   Manual Therapy  Manual Lymphatic Drainage (MLD)    Manual therapy comments  Measured right UE to determine progress    Manual Lymphatic Drainage (MLD)  In supine with 2 pillows.  Short neck, 5 deep breaths, gentle abdominal work, Lt axillary and Rt inguinal nodes, interaxillary and Rt axillo inguinal pathway established.  Then Rt UE from proximal to distal and retracing all steps             PT Education - 08/14/17 1650    Education provided  Yes    Education Details  Educated pt and her husband at length on donning a compression sleeve using prosthetic lotion. Educated them several times on the plan for her to wear her current daytime garment and go back to bandaging at night until her new garments arrive.    Person(s) Educated  Patient;Spouse    Methods  Explanation;Demonstration;Tactile cues;Verbal cues    Comprehension  Returned  demonstration;Verbalized understanding;Verbal cues required;Tactile cues required       PT Short Term Goals - 07/14/17 1413      PT SHORT TERM GOAL #1   Title  Pt will perform HEP with husband's supervision for improved balance and gait.    Baseline  Patient has HEP established, however has not been doing x 1 week due to death of a friend.    Time  4    Period  Weeks    Status  Partially Met    Target Date  07/07/17      PT SHORT TERM GOAL #2   Title  Pt will improve TUG score to less than or equal to 15 seconds for decreased fall risk.    Baseline  20.69 seconds without a device    Time  4    Period  Weeks    Status  Not Met      PT SHORT TERM GOAL #3   Title  Pt will improve postural control, as evidenced by ability to regain posterior balance in posterior push and release test, in 2 steps or less.    Baseline  Patient maintained balance without stepping.      Time  4    Period  Weeks    Status  Achieved      PT SHORT TERM GOAL #4   Title  Pt/husband will verbalize understanding of fall prevention in home environment.    Time  4    Period  Weeks    Status  New        PT Long Term Goals - 08/14/17 1703      PT LONG TERM GOAL #5   Title  Patient will reduce swelling at 10 cm proximal to her olecranon process to </= 25 cm.    Baseline  25.4 cm on 08/14/17    Time  4    Period  Weeks    Status  On-going      PT LONG TERM GOAL #6   Title  Patient will reduce swelling at 10 cm proximal to her ulnar styloid process to </= 19 cm.    Baseline  20.8 cm on 08/14/17    Time  4    Period  Weeks    Status  On-going      PT LONG TERM  GOAL #7   Title  Patient and/or her husband will verbalize understanding of where and how to be fitted for new compression garments.    Time  4    Period  Weeks    Status  Achieved      PT LONG TERM GOAL #8   Title  Patient will demonstrate proper compression bandaging techniques for her right arm with her husband's assistance.    Time  4     Period  Weeks    Status  Achieved      PT LONG TERM GOAL  #9   TITLE  Patient will reduce Lymphedema Life Impact score to </= 8% functional limitation.    Time  4    Period  Weeks    Status  On-going            Plan - 08/14/17 1654    Clinical Impression Statement  Patient and her husband came to the PT appointment letting PT know they have decided to stop Neuro PT and that they feel she has reached her max potential for now. Arm was unwrapped and washed and then remeasured for progress. She had a notable reduction in her arm size since the eval and is very close to the size of her left arm at this point. She requested to stop PT for lymphedema and resume daily wear of daytime garments and nightly bandaging. She was measured today during the PT session (took 15 min) by a fitter from [facility] for new daytime compression sleeve and gauntlet (20-30 mmHg to allow for easier donning) and for an arm [place] wrap for nighttime. After manual lymph drainge was performed by PT, her husband and she had many questions about the treatment plan and how to safely don her garment without risking skin tear. They were instructed several times to wear the daytime garment and resume wrapping at night until new garments arrive next week. They were given prosthetic lotion to ease the donning of her garment. Although it took a significant amount of time and repeated instruction, they do appear to be competent in the Maintenance Phsae of her lymphedema management at this point.    Rehab Potential  Good    Clinical Impairments Affecting Rehab Potential  hx of mild dementia    PT Treatment/Interventions  ADLs/Self Care Home Management;DME Instruction;Therapeutic activities;Orthotic Fit/Training;Patient/family education;Manual techniques;Manual lymph drainage;Compression bandaging    PT Next Visit Plan  Will likely D/C patient after garments arrive if there is no issue with her being able to perform the Maintenance Phase  of treatment. Will wait to D/C until garments arrive and they are independent.    Consulted and Agree with Plan of Care  Patient;Family member/caregiver    Family Member Consulted  Husband       Patient will benefit from skilled therapeutic intervention in order to improve the following deficits and impairments:  Decreased skin integrity, Increased edema, Decreased knowledge of precautions, Decreased knowledge of use of DME  Visit Diagnosis: Postmastectomy lymphedema  Abnormal posture     Problem List Patient Active Problem List   Diagnosis Date Noted  . Parkinson's disease (tremor, stiffness, slow motion, unstable posture) ([place]) 11/13/2014  . Piriformis syndrome of left side 05/08/2014  . DDD (degenerative disc disease), lumbosacral 03/25/2014  . Abnormality of gait 03/25/2014  . Hypothyroidism 10/30/2013  . RBBB 10/07/2013  . Left shoulder pain 12/15/2011  . Postmenopause atrophic vaginitis 11/16/2011  . At high risk for falls 04/20/2011  . Blood type, Rh  negative   . Thyroid cancer ([place])   . Endocervical polyp   . Osteoarthrosis, unspecified whether generalized or localized, involving lower leg 05/12/2009  . DEGENERATIVE DISC DISEASE, CERVICAL SPINE, W/RADICULOPATHY 04/30/2009  . BACK PAIN, THORACIC REGION, CHRONIC 04/30/2009  . DEPRESSION 11/14/2007  . Essential hypertension 11/14/2007  . CELLULITIS 11/14/2007  . BREAST CANCER, HX OF 11/14/2007  . THYROID CANCER, HX OF 11/14/2007    Annia Friendly, PT 08/14/17 5:06 PM  [place], [place], [address] Phone: [phone]   Fax:  [phone]  Name: Michelle Sims MRN: [id number] Date of Birth: July 12, 1929  PHYSICAL THERAPY DISCHARGE SUMMARY  Visits from Start of Care: 5 for lymphedema  Current functional level related to goals / functional outcomes: Patient is managing her lymphedema well with her compression garments.    Remaining deficits: None   Education / Equipment: Lymphedema maintenance phase of treatment Plan: Patient agrees to discharge.  Patient goals were met. Patient is being discharged due to meeting the stated rehab goals.  ?????        Annia Friendly, [place] 10/04/17 2:11 PM

## 2017-08-15 ENCOUNTER — Encounter: Payer: Self-pay | Admitting: Physical Therapy

## 2017-08-15 NOTE — Therapy (Signed)
[facility] [address] [place], [place], [address] Phone: [phone]   Fax:  [phone]  Physical Therapy Treatment  Patient Details  Name: Michelle Sims MRN: [id number] Date of Birth: [birth year] Referring Provider: Dr. Wenda Low   Encounter Date: 08/14/2017  PT End of Session - 08/15/17 1618    Visit Number  16 6th lymphedema; 11th neuro (Pt seen at 14:00, prior to lymphedema session, with that documentation completed earlier)    Number of Visits  17    Date for PT Re-Evaluation  09/11/17    Authorization Type  BCBS, Medicare  VL 90 (pt,ot, slp)    Authorization - Visit Number  16 combined # visits neuro & lymphedema    Authorization - Number of Visits  65    PT Start Time  1404    PT Stop Time  1445    PT Time Calculation (min)  41 min    Activity Tolerance  Patient tolerated treatment well    Behavior During Therapy  [facility] for tasks assessed/performed       Past Medical History:  Diagnosis Date  . Anxiety   . Arthritis    knee & back   . Blood type, Rh negative   . Breast cancer ([place])   . Chronic fatigue   . Depression   . Diverticulosis   . Endocervical polyp   . GERD (gastroesophageal reflux disease)   . Hypertension   . Hypothyroidism   . IBS (irritable bowel syndrome)   . Insomnia   . Lymphedema   . Neuromuscular disorder ([place])    Parkinson's disease   . Osteopenia 06/2015   T score -2.4 distal third of the radius  . Parkinson disease ([place])   . RBBB 10/07/2013  . Thyroid cancer [facility])     Past Surgical History:  Procedure Laterality Date  . APPENDECTOMY  1979  . BREAST SURGERY  1979   MASTECTOMY AND RECONSTRUCTION  . DILATION AND CURETTAGE OF UTERUS     MISCARRIAGE  . EYE SURGERY Bilateral    w/ IOL  . HYSTEROSCOPY  [id number]   AND D&C  . MASTECTOMY Bilateral   . THYROIDECTOMY    . TONSILLECTOMY    . TOTAL KNEE ARTHROPLASTY Right 10/07/2013   Procedure: RIGHT TOTAL KNEE  ARTHROPLASTY;  Surgeon: Kerin Salen, MD;  Location: [facility];  Service: Orthopedics;  Laterality: Right;  . TUBAL LIGATION      There were no vitals filed for this visit.  Subjective Assessment - 08/15/17 1649    Subjective  Husband reports he worked with her over the weekend and feels he helped her out more than in therapy.    Patient is accompained by:  Family member husband     Pertinent History  Bilateral mastectomy 1979 with right ALND; anxiety, arthritis, depression, diverticulosis, GERD, HTN, IBS, osteopenia, R TKR; per Dr. Doristine Devoid notes-dementia    Patient Stated Goals  Pt's goals for therapy for improved mobility and balance. Less fear of falling            Neuro Re-education -Pt performed HEP including counter balance, sit<>stand and corner balance exercises (see instructions), as pt follows HEP instructions with min cues from PT   Mini-BESTest: Balance Evaluation Systems Test  2005-2013 [facility] [employer] All rights reserved. ________________________________________________________________________________________Anticipatory_________Subscore___3__/6 1. SIT TO STAND Instruction: "Cross your arms across your chest. Try not to use your hands unless you must.Do not let your legs lean against the  back of the chair when you stand. Please stand up now." (2) Normal: Comes to stand without use of hands and stabilizes independently. X(1) Moderate: Comes to stand WITH use of hands on first attempt. (0) Severe: Unable to stand up from chair without assistance, OR needs several attempts with use of hands. 2. RISE TO TOES Instruction: "Place your feet shoulder width apart. Place your hands on your hips. Try to rise as high as you can onto your toes. I will count out loud to 3 seconds. Try to hold this pose for at least 3 seconds. Look straight ahead. Rise now." (2) Normal: Stable for 3 s with maximum height. X(1) Moderate: Heels up, but not full range (smaller than  when holding hands), OR noticeable instability for 3 s. (0) Severe: < 3 s. 3. STAND ON ONE LEG Instruction: "Look straight ahead. Keep your hands on your hips. Lift your leg off of the ground behind you without touching or resting your raised leg upon your other standing leg. Stay standing on one leg as long as you can. Look straight ahead. Lift now." Left: Time in Seconds Trial 1:__1.31___Trial 2:__1.91___ (2) Normal: 20 s. X(1) Moderate: < 20 s. (0) Severe: Unable. Right: Time in Seconds Trial 1:__1.82___Trial 2:__0.92___ (2) Normal: 20 s. X(1) Moderate: < 20 s. (0) Severe: Unable To score each side separately use the trial with the longest time. To calculate the sub-score and total score use the side [left or right] with the lowest numerical score [i.e. the worse side]. ______________________________________________________________________________________Reactive Postural Control___________Subscore:__2___/6 4. COMPENSATORY STEPPING CORRECTION- FORWARD Instruction: "Stand with your feet shoulder width apart, arms at your sides. Lean forward against my hands beyond your forward limits. When I let go, do whatever is necessary, including taking a step, to avoid a fall." (2) Normal: Recovers independently with a single, large step (second realignment step is allowed). (1) Moderate: More than one step used to recover equilibrium. X(0) Severe: No step, OR would fall if not caught, OR falls spontaneously. 5. COMPENSATORY STEPPING CORRECTION- BACKWARD Instruction: "Stand with your feet shoulder width apart, arms at your sides. Lean backward against my hands beyond your backward limits. When I let go, do whatever is necessary, including taking a step, to avoid a fall." (2) Normal: Recovers independently with a single, large step. (1) Moderate: More than one step used to recover equilibrium. X(0) Severe: No step, OR would fall if not caught, OR falls spontaneously. 6. COMPENSATORY STEPPING  CORRECTION- LATERAL Instruction: "Stand with your feet together, arms down at your sides. Lean into my hand beyond your sideways limit. When I let go, do whatever is necessary, including taking a step, to avoid a fall." Left X(2) Normal: Recovers independently with 1 step (crossover or lateral OK). (1) Moderate: Several steps to recover equilibrium. (0) Severe: Falls, or cannot step. Right X(2) Normal: Recovers independently with 1 step (crossover or lateral OK). (1) Moderate: Several steps to recover equilibrium. (0) Severe: Falls, or cannot step. Use the side with the lowest score to calculate sub-score and total score. ____________________________________________________________________________________Sensory Orientation_____________Subscore:______4___/6 7. STANCE (FEET TOGETHER); EYES OPEN, FIRM SURFACE Instruction: "Place your hands on your hips. Place your feet together until almost touching. Look straight ahead. Be as stable and still as possible, until I say stop." Time in seconds:________ X(2) Normal: 30 s. (1) Moderate: < 30 s. (0) Severe: Unable. 8. STANCE (FEET TOGETHER); EYES CLOSED, FOAM SURFACE Instruction: "Step onto the foam. Place your hands on your hips. Place your feet together until almost touching.  Be as stable and still as possible, until I say stop. I will start timing when you close your eyes." Time in seconds:________ (2) Normal: 30 s. X(1) Moderate: < 30 s. (0) Severe: Unable. 9. INCLINE- EYES CLOSED Instruction: "Step onto the incline ramp. Please stand on the incline ramp with your toes toward the top. Place your feet shoulder width apart and have your arms down at your sides. I will start timing when you close your eyes." Time in seconds:________ (2) Normal: Stands independently 30 s and aligns with gravity. X(1) Moderate: Stands independently <30 s OR aligns with surface. (0) Severe:  Unable. _________________________________________________________________________________________Dynamic Gait ______Subscore___4_____/10 10. CHANGE IN GAIT SPEED Instruction: "Begin walking at your normal speed, when I tell you 'fast', walk as fast as you can. When I say 'slow', walk very slowly." (2) Normal: Significantly changes walking speed without imbalance. X(1) Moderate: Unable to change walking speed or signs of imbalance. (0) Severe: Unable to achieve significant change in walking speed AND signs of imbalance. [place] - HORIZONTAL Instruction: "Begin walking at your normal speed, when I say "right", turn your head and look to the right. When I say "left" turn your head and look to the left. Try to keep yourself walking in a straight line." (2) Normal: performs head turns with no change in gait speed and good balance. X(1) Moderate: performs head turns with reduction in gait speed. (0) Severe: performs head turns with imbalance. 12. WALK WITH PIVOT TURNS Instruction: "Begin walking at your normal speed. When I tell you to 'turn and stop', turn as quickly as you can, face the opposite direction, and stop. After the turn, your feet should be close together." (2) Normal: Turns with feet close FAST (< 3 steps) with good balance. X(1) Moderate: Turns with feet close SLOW (>4 steps) with good balance. (0) Severe: Cannot turn with feet close at any speed without imbalance. 13. STEP OVER OBSTACLES Instruction: "Begin walking at your normal speed. When you get to the box, step over it, not around it and keep walking." (2) Normal: Able to step over box with minimal change of gait speed and with good balance. (1) Moderate: Steps over box but touches box OR displays cautious behavior by slowing gait. X(0) Severe: Unable to step over box OR steps around box. 14. TIMED UP & GO WITH DUAL TASK [3 METER WALK] Instruction TUG: "When I say 'Go', stand up from chair, walk at your  normal speed across the tape on the floor, turn around, and come back to sit in the chair." Instruction TUG with Dual Task: "Count backwards by threes starting at ___. When I say 'Go', stand up from chair, walk at your normal speed across the tape on the floor, turn around, and come back to sit in the chair. Continue counting backwards the entire time." TUG: __17.16______seconds; Dual Task TUG: __21.19______seconds (2) Normal: No noticeable change in sitting, standing or walking while backward counting when compared to TUG without Dual Task. X(1) Moderate: Dual Task affects either counting OR walking (>10%) when compared to the TUG without Dual Task. (0) Severe: Stops counting while walking OR stops walking while counting. When scoring item 14, if subject's gait speed slows more than 10% between the TUG without and with a Dual Task the score should be decreased by a point. TOTAL SCORE: _____13___/28   Self Care:  Discussed pt's POC and progress towards goals.  Discussed that she is still at a fall risk per TUG and  MiniBESTest scores and likely needs supervision for long distance gait and for correct performance of HEP.  Husband questions why HEP has not been progressed; PT explains that she is doing exercises as part of HEP that therapist feels she can safely perform at home and that address her balance issues.  Explained that therapist cannot give her exercises that would put her at risk for falls at home.  (Especially, as pt reports she is often performing HEP when husband is not there).  Inquired if any further PD-related questions, and provided name of social worker at [facility] if needed.             PT Education - 08/15/17 1614    Education provided  Yes    Education Details  REviewed HEP and provided new handout (as old handout print was fading).  Discussed progress towards goals and plans for d/c.    Person(s) Educated  Patient;Spouse    Methods   Explanation;Demonstration    Comprehension  Verbalized understanding;Returned demonstration       PT Short Term Goals - 07/14/17 1413      PT SHORT TERM GOAL #1   Title  Pt will perform HEP with husband's supervision for improved balance and gait.    Baseline  Patient has HEP established, however has not been doing x 1 week due to death of a friend.    Time  4    Period  Weeks    Status  Partially Met    Target Date  07/07/17      PT SHORT TERM GOAL #2   Title  Pt will improve TUG score to less than or equal to 15 seconds for decreased fall risk.    Baseline  20.69 seconds without a device    Time  4    Period  Weeks    Status  Not Met      PT SHORT TERM GOAL #3   Title  Pt will improve postural control, as evidenced by ability to regain posterior balance in posterior push and release test, in 2 steps or less.    Baseline  Patient maintained balance without stepping.      Time  4    Period  Weeks    Status  Achieved      PT SHORT TERM GOAL #4   Title  Pt/husband will verbalize understanding of fall prevention in home environment.    Time  4    Period  Weeks    Status  New        PT Long Term Goals - 08/15/17 1659      PT LONG TERM GOAL #1   Title  Pt and husband will verbalize continued community fitness, including updated HEP for continued targeted work on balance and gait.  TARGET Date:  09/06/2017    Baseline  Verbalized plans for continuing HEP    Time  8    Period  Weeks    Status  Achieved      PT LONG TERM GOAL #2   Title  Pt will improve TUG score to less than or equal to 18 seconds for improving mobility.    Baseline  22.75 on 08/07/2017    Time  8    Period  Weeks    Status  Not Met      PT LONG TERM GOAL #3   Title  Pt will improve MiniBESTest score to at least 14/28 for decreased fall risk.  Baseline  13/28 08/14/17    Time  8    Period  Weeks    Status  Partially Met      PT LONG TERM GOAL #4   Title  Pt will ambulate at least 1000 ft over  indoor/outdoor surfaces, with supervision and appropriate assistive device, for improved community gait.    Baseline  not assessed, due to pt/husband requesting d/c today.    Time  8    Period  Weeks    Status  Not Met            Plan - 08/15/17 1621    Clinical Impression Statement  Husband present for full PT session today, and reports at beginning of session that they would like to end Neuro PT today.  Husband feels that he can assist patient with exercises at home.  Pt has improved on MiniBESTest score to 14/28; however, she remains at fall risk per MiniBESTest and TUG scores.  She is very fearful of and has difficulty with negotiating obstacles, ramp incline and compliant surfaces as part of MiniBESTest, and discussed these difficulties as why HEP has not been able to be progressed.  Pt has met LTG 1 and 3 partially met.  LTG 2 and 4 not met.  Pt/husband agree to discharge neuro PT today, but will continue per POC for lymphedema treatment.    Rehab Potential  Good    Clinical Impairments Affecting Rehab Potential  hx of mild dementia    PT Treatment/Interventions  ADLs/Self Care Home Management;DME Instruction;Therapeutic activities;Orthotic Fit/Training;Patient/family education;Manual techniques;Manual lymph drainage;Compression bandaging;Balance training;Gait training;Neuromuscular re-education    PT Next Visit Plan  D/C neuro PT this visit.    Consulted and Agree with Plan of Care  Patient;Family member/caregiver    Family Member Consulted  Husband       Patient will benefit from skilled therapeutic intervention in order to improve the following deficits and impairments:  Decreased skin integrity, Increased edema, Decreased knowledge of precautions, Decreased knowledge of use of DME  Visit Diagnosis: Other abnormalities of gait and mobility  Unsteadiness on feet     Problem List Patient Active Problem List   Diagnosis Date Noted  . Parkinson's disease (tremor, stiffness,  slow motion, unstable posture) ([place]) 11/13/2014  . Piriformis syndrome of left side 05/08/2014  . DDD (degenerative disc disease), lumbosacral 03/25/2014  . Abnormality of gait 03/25/2014  . Hypothyroidism 10/30/2013  . RBBB 10/07/2013  . Left shoulder pain 12/15/2011  . Postmenopause atrophic vaginitis 11/16/2011  . At high risk for falls 04/20/2011  . Blood type, Rh negative   . Thyroid cancer ([place])   . Endocervical polyp   . Osteoarthrosis, unspecified whether generalized or localized, involving lower leg 05/12/2009  . DEGENERATIVE DISC DISEASE, CERVICAL SPINE, W/RADICULOPATHY 04/30/2009  . BACK PAIN, THORACIC REGION, CHRONIC 04/30/2009  . DEPRESSION 11/14/2007  . Essential hypertension 11/14/2007  . CELLULITIS 11/14/2007  . BREAST CANCER, HX OF 11/14/2007  . THYROID CANCER, HX OF 11/14/2007    Terriana Barreras W. 08/15/2017, 4:59 PM  Frazier Butt., PT   [facility] [address] [place], [place], [address] Phone: [phone]   Fax:  [phone]  Name: Michelle Sims MRN: [id number] Date of Birth: January 10, 1930   PHYSICAL THERAPY DISCHARGE SUMMARY (NeuroPT)  Visits from Start of Care: 11 (Neurorehab PT)  Current functional level related to goals / functional outcomes: PT Long Term Goals - 08/15/17 1659      PT LONG TERM GOAL #  1   Title  Pt and husband will verbalize continued community fitness, including updated HEP for continued targeted work on balance and gait.  TARGET Date:  09/06/2017    Baseline  Verbalized plans for continuing HEP    Time  8    Period  Weeks    Status  Achieved      PT LONG TERM GOAL #2   Title  Pt will improve TUG score to less than or equal to 18 seconds for improving mobility.    Baseline  22.75 on 08/07/2017    Time  8    Period  Weeks    Status  Not Met      PT LONG TERM GOAL #3   Title  Pt will improve MiniBESTest score to at least 14/28 for decreased fall risk.    Baseline   13/28 08/14/17    Time  8    Period  Weeks    Status  Partially Met      PT LONG TERM GOAL #4   Title  Pt will ambulate at least 1000 ft over indoor/outdoor surfaces, with supervision and appropriate assistive device, for improved community gait.    Baseline  not assessed, due to pt/husband requesting d/c today.    Time  8    Period  Weeks    Status  Not Met         Remaining deficits: Balance, posture, pt has difficulty negotiating curbs, ramps, compliant surfaces   Education / Equipment: Educated in [employer], Industrial/product designer  Plan: Patient agrees to discharge.  Patient goals were partially met. Patient is being discharged due to the patient's request.  ?????Pt is continuing with lymphedema PT treatment.    Mady Haagensen, PT 08/15/17 5:01 PM Phone: [phone] Fax: [phone]

## 2017-08-16 DIAGNOSIS — F339 Major depressive disorder, recurrent, unspecified: Secondary | ICD-10-CM | POA: Diagnosis not present

## 2017-08-16 DIAGNOSIS — L03113 Cellulitis of right upper limb: Secondary | ICD-10-CM | POA: Diagnosis not present

## 2017-08-16 DIAGNOSIS — E039 Hypothyroidism, unspecified: Secondary | ICD-10-CM | POA: Diagnosis not present

## 2017-08-16 DIAGNOSIS — I10 Essential (primary) hypertension: Secondary | ICD-10-CM | POA: Diagnosis not present

## 2017-08-17 ENCOUNTER — Encounter: Payer: BLUE CROSS/BLUE SHIELD | Admitting: Physical Therapy

## 2017-08-17 DIAGNOSIS — I890 Lymphedema, not elsewhere classified: Secondary | ICD-10-CM | POA: Diagnosis not present

## 2017-08-17 DIAGNOSIS — C50919 Malignant neoplasm of unspecified site of unspecified female breast: Secondary | ICD-10-CM | POA: Diagnosis not present

## 2017-08-17 DIAGNOSIS — Z9010 Acquired absence of unspecified breast and nipple: Secondary | ICD-10-CM | POA: Diagnosis not present

## 2017-08-17 DIAGNOSIS — I972 Postmastectomy lymphedema syndrome: Secondary | ICD-10-CM | POA: Diagnosis not present

## 2017-08-18 ENCOUNTER — Ambulatory Visit: Payer: BLUE CROSS/BLUE SHIELD | Admitting: Physical Therapy

## 2017-08-24 DIAGNOSIS — R0681 Apnea, not elsewhere classified: Secondary | ICD-10-CM | POA: Diagnosis not present

## 2017-08-31 ENCOUNTER — Other Ambulatory Visit: Payer: Self-pay | Admitting: Neurology

## 2017-08-31 DIAGNOSIS — G20 Parkinson's disease: Secondary | ICD-10-CM

## 2017-09-04 ENCOUNTER — Ambulatory Visit (INDEPENDENT_AMBULATORY_CARE_PROVIDER_SITE_OTHER): Payer: BLUE CROSS/BLUE SHIELD | Admitting: Neurology

## 2017-09-04 ENCOUNTER — Encounter: Payer: Self-pay | Admitting: Neurology

## 2017-09-04 VITALS — BP 151/79 | HR 81 | Ht 64.5 in | Wt 135.0 lb

## 2017-09-04 DIAGNOSIS — G20 Parkinson's disease: Secondary | ICD-10-CM

## 2017-09-04 NOTE — Patient Instructions (Addendum)
You can continue with the current dose of Sinemet 4 doses per day and the long-acting at night.  You can continue with the patch for your memory as per Dr. Inda Merlin.  We will have you follow up in 6 months, you can see Ward Givens, NP.   You can try Melatonin at night for sleep: take 1 mg to 3 mg, one to 2 hours before your bedtime. You can go up to 5 mg if needed. It is over the counter and comes in pill form, chewable form and spray, if you prefer.

## 2017-09-04 NOTE — Progress Notes (Signed)
Subjective:    Patient ID: Michelle Sims is a 84 y.o. female.  HPI     Interim history:   Michelle Sims is an 84 year-old right-handed woman with an underlying medical history of chronic back pain, hypertension, thyroid cancer, breast cancer, status post thyroidectomy, tonsillectomy, appendectomy, tubal ligation and mastectomy with reconstruction, who presents for followup consultation of her parkinsonism, probable right-sided predominant Parkinson's disease of the akinetic-rigid type. She is accompanied by her husband again today. I last saw her on 02/22/2016, at which time she was primarily complaining about fatigue. She was not on Sinemet CR. She was unable to tolerate Sinemet 1-1/2 pills 4 times a day. She was also at times confused and sleepy during the day. She was advised to continue with Sinemet 1-1/2 pills alternating with one pill for a total of 5 per day. She was advised to restart Sinemet CR at bedtime.  Today, 09/04/2017: She reports doing okay. She occasionally feels confused, she occasionally feels foggy headed. She still suffers from lymphedema and had an infection in the interim. Has received a new compression sleeve. She still has quite a bit of fatigue on a day-to-day basis. She saw geriatric specialist at [facility] in the interim and was advised to come off of some of her medications. She had an interim brain MRI on 05/20/2017 and I reviewed the results: IMPRESSION:  No acute intracranial abnormality. Nonspecific global atrophy. Extensive white matter changes throughout the brain. This most commonly reflects microangiopathic changes.  She was seen in the interim by Dr. Carles Sims on several occasions. She was advised to taper off of Sinemet. She was started on Exelon patch for memory loss. She was unable to tolerate this. Her primary care physician took her off of it as I understand.   She is on C/L 1  pills alternating with 1 pill for 4 doses and CR at night. She has restarted  Exelon. She has been in PT. Has sleep eval pending with Dr. Maxwell Sims. Gave up driving. She is still fatigued. Not sleeping well, has not tried Melatonin.    The patient's allergies, current medications, family history, past medical history, past social history, past surgical history and problem list were reviewed and updated as appropriate.    Previously (copied from all previous notes for reference):  I saw her on 10/14/2015, at which time she reported doing fairly well but increasing the Sinemet to 2 pills for the first dose felt too much and she reduced her back to within a half pills. She was participating in boxing for Parkinson's disease. She has more complaints about confusion, thankfully no falls, she was alternating 1-1/2 pills of Sinemet with one pill for a total of 4 doses. I asked her to try to increase all 4 doses to 1-1/2 pills. However, she called back on 11/11/2015 reporting that she did not feel right with this dose and she was advised to reduce it back to 1-1/2 pills alternating with one pill rather than 1-1/2 pills 4 times a day.    I saw her on 05/25/2015, at which time she reported not feeling too well overall, reported more fatigue, more slowness, more cognitive issues, felt less confident with walking. She reported no recent falls, she had no choking episodes. She was not drinking enough fluid she admitted. She had some hip pain bilaterally as well. I suggested a cautious increase in her Sinemet to 2 pills for the first dose then 1 for the next 3 doses for the  day. We talked about coenzyme Q10 supplementation. She was encouraged to drink more water and exercise regularly.     I saw her on 11/24/2014, at which time she reported that she was trying to continue with water aerobics and her walking exercises. She did not like the spinning class at the [facility]. She had some visual illusions but no frank hallucinations. She has not fallen but was fearful of falling. She had some dream  enactments. These were few and far between. She was on a small dose of clonazepam in the past and took 0.5 mg for years. She felt that her memory was worse mood was stable. Overall, she felt that she had not progressed very much but she did for about the future. I suggested she continue with Sinemet 1 pill 4 times a day.   I saw her on 07/22/2014, at which time she reported a recent fall about a week prior at the hairdresser's. She bruised her knees. She was not using a cane at the time. Her husband noted that she would probably do better using a cane. She had blood work with her primary care physician which per husband was mostly fine. She was not drinking enough water. She was complaining of fatigue. She was not always sleeping well. She was not always rested. She had not tried melatonin yet. I suggested she try to exercise regularly. For sleep, I suggested she try melatonin. I asked her to drink more water and add more fiber to her diet. [facility].    I saw her on 04/17/2014, at which time she reported more freezing and start hesitation. She had not followed recently. She had more fatigue. She was not drinking enough water. She was exercising regularly she was having bilateral hip pain. I suggested we increase her Sinemet to 4 times a day, at 7, 11, 3 PM and 8 PM.   In the interim, she was seen by Dr. Oneida Sims in sports medicine for left-sided piriformis syndrome. I reviewed his office notes from 05/27/2014 as well as 07/02/2014. Her Cymbalta was increased.   I saw her on 01/14/2014, at which time she reported doing well after her right total knee replacement in September 2015 and she was in physical therapy through her orthopedics office. She was on Sinemet 3 times a day. She did notice that her posture was worse. Sometimes she had confusion. I did not make medication changes at the time. She presents for a sooner than scheduled  appointment secondary to more fatigue and balance issues.    I saw her on 07/30/2013, at which time she felt her PD symptoms were stable. She was taking Sinemet 1 pill 3 times a day. She was on half a pill of clonazepam, 0.25 mg each night. She was on Cymbalta 30 mg daily per her psychiatrist, Dr. Caprice Beaver. She had finished her study at [facility]. I asked her to start using a cane for safety. I did not make any changes to her medications at the time.    I first met her on 01/30/2013, at which time it felt her exam was stable. She had participated in a study at [facility] which included taking a DaT scan. She had been taking Sinemet 3 times a day but was taking it with her meals. The only thing I suggested at the time of her first visit was to take the Sinemet away from her mealtimes to  ensure better absorption. In the interim, her husband called back stating that she had been experiencing some nausea. Her primary care physician reduced her Sinemet to half a pill 3 times a day. I talked to the patient in April and suggested that she continue with half a pill 3 times a day and try taking a whole pill 3 times a day if she noted an increase in her parkinsonian symptoms.    She previously followed with Dr. Morene Antu and was last seen by him on 12/06/2011, at which time he recommended a DaT scan and a trial of Sinemet to help differentiate her symptoms and help diagnose Parkinson's disease. She was advised that before she could have a DaT scan she would have to come off of Celexa. She did not end up having that test, as she enrolled in a study at [place]. She was supposed to followup after a couple of months, but did not. However, in the interim she started going to [employer] and got into a clinical trial for Parkinson's there. She was placed on Requip, then Sinemet. Requip caused severe sleepiness. She finished the study at [facility] under Dr. Maxine Glenn and as part of the study, she had 2 DaT scans. She did not take any study  drugs. She reported severe R knee pain and had injections into her knee. She may need a knee replacement surgery, but is afraid. She goes to [employer] at [employer]. She lives at [place] with her husband in independent living and still drives. Her husband states she is a good driver. She has fatigue. She has seen Dr. Oneida Sims in sports medicine for her arthritis.     She was initially seen by Dr. Erling Cruz on 05/15/2002 for pain in her hips with evidence of lumbar spinal radiculopathy. She had degenerative disc disease. 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 She did well with conservative therapy. She has a long-standing history of back pain and was treated by Dr. Niel Hummer, who recommended she use amitriptyline. She has a over a 5 year history of deteriorating handwriting. Her balance has been declining as well. She has to hold onto the rail, going up and down steps. She has fallen infrequently. Her husband and her son started noticing that she shuffles her feet while walking. She started complaining of fatigue and lack of energy. She has remained fairly independent in her ADLs. She has problems with her memory. She denies a family history of tremor. She has long-standing loss of sense of smell. She does not have constipation or acting out dreams. She denies bowel or bladder incontinence. She has been seen by Dr. Tamala Julian for palpitations. CMP,CBC, TSH, T4, and B12 were normal in 2011 and 2012. She had been on doses of vitamin B6 200 to 100 mg per day. Her vitamin B6 level 08/13/2010 was greater than 100. Her B12 was 695. She discontinued B6. EMG/NCV 8./16/12 was normal except for lumbosacral deinnervation, compatible with lumbosacral radiculopathy. MRI brain from 08/26/2010 showed chronic microvascular changes and mild atrophy. She has seen Dr. Casimiro Needle for depression and was changed from citalopram to sertraline  50 mg daily without any benefit and then started seeing Dr. Harley Alto at [facility]. She went off of tramadol because of the possibility of serotonergic syndrome, but restarted it. She has been in counseling. She denies numbness in her feet and legs. She describes involuntary movements.  She has right knee pain. Basic metabolic panel normal 0/1/75. A course of physical therapy for her gait did not help.    Her Past Medical History Is Significant For: Past Medical History:  Diagnosis Date  . Anxiety   . Arthritis    knee & back   . Blood type, Rh negative   . Breast cancer ([place])   . Chronic fatigue   . Depression   . Diverticulosis   . Endocervical polyp   . GERD (gastroesophageal reflux disease)   . Hypertension   . Hypothyroidism   . IBS (irritable bowel syndrome)   . Insomnia   . Lymphedema   . Neuromuscular disorder ([place])    Parkinson's disease   . Osteopenia 06/2015   T score -2.4 distal third of the radius  . Parkinson disease ([place])   . RBBB 10/07/2013  . Thyroid cancer [facility])     Her Past Surgical History Is Significant For: Past Surgical History:  Procedure Laterality Date  . APPENDECTOMY  1979  . BREAST SURGERY  1979   MASTECTOMY AND RECONSTRUCTION  . DILATION AND CURETTAGE OF UTERUS     MISCARRIAGE  . EYE SURGERY Bilateral    w/ IOL  . HYSTEROSCOPY  [id number]   AND D&C  . MASTECTOMY Bilateral   . THYROIDECTOMY    . TONSILLECTOMY    . TOTAL KNEE ARTHROPLASTY Right 10/07/2013   Procedure: RIGHT TOTAL KNEE ARTHROPLASTY;  Surgeon: Kerin Salen, MD;  Location: [place];  Service: Orthopedics;  Laterality: Right;  . TUBAL LIGATION      Her Family History Is Significant For: Family History  Problem Relation Age of Onset  . Hypertension Mother   . Hyperlipidemia Mother   . Heart failure Mother   . Hypertension Father   . Hyperlipidemia Father   . Heart failure Father   . Hyperlipidemia Brother   . Hypertension Brother   . Heart attack Brother   . Breast cancer  Neg Hx     Her Social History Is Significant For: Social History   Socioeconomic History  . Marital status: Married    Spouse name: Not on file  . Number of children: Not on file  . Years of education: Not on file  . Highest education level: Not on file  Occupational History  . Occupation: retired    Comment: "odd jobs"  Social Needs  . Financial resource strain: Not on file  . Food insecurity:    Worry: Not on file    Inability: Not on file  . Transportation needs:    Medical: Not on file    Non-medical: Not on file  Tobacco Use  . Smoking status: Never Smoker  . Smokeless tobacco: Never Used  Substance and Sexual Activity  . Alcohol use: Yes    Alcohol/week: 0.0 standard drinks    Comment: rare  . Drug use: No  . Sexual activity: Never    Birth control/protection: Surgical, Post-menopausal    Comment: 1st intercourse 84 yo-Fewer than 5 partners  Lifestyle  . Physical activity:    Days per week: Not on file    Minutes per session: Not on file  . Stress: Not on file  Relationships  . Social connections:    Talks on phone: Not on file    Gets together: Not on file    Attends religious service: Not on file    Active member of club or organization: Not on file    Attends meetings of clubs or  organizations: Not on file    Relationship status: Not on file  Other Topics Concern  . Not on file  Social History Narrative  . Not on file    Her Allergies Are:  Allergies  Allergen Reactions  . Sulfa Antibiotics Rash  . Biaxin [Clarithromycin] Other (See Comments)    Pt does not remember reaction   . Ciprofloxacin Nausea And Vomiting  . Hctz [Hydrochlorothiazide] Other (See Comments)    weakness and hyponatremia  . Macrobid [Nitrofurantoin] Nausea Only  . Other Nausea Only  . Prednisone Other (See Comments)    Swelling and facial redness   . Sulfamethoxazole-Trimethoprim Other (See Comments) and Rash    Sore throat  :   Her Current Medications Are:   Outpatient Encounter Medications as of 09/04/2017  Medication Sig  . amLODipine (NORVASC) 2.5 MG tablet Take 2.5 mg by mouth daily.  . Ascorbic Acid (VITAMIN C PO) Take by mouth.  . Calcium Carb-Cholecalciferol (CALCIUM 600 + D PO) Take 1 tablet by mouth daily with lunch.  . carbidopa-levodopa (SINEMET IR) 25-100 MG tablet TAKE 1 & 1/2 TAB IN THE AM, 1 TAB MID AM, 1 & 1/2 TAB IN THE AFTERNOON, 1 TAB IN THE EVENING  . Carbidopa-Levodopa ER (SINEMET CR) 25-100 MG tablet controlled release TAKE 1 TABLET BY MOUTH EVERYDAY AT BEDTIME  . cholecalciferol (VITAMIN D) 1000 units tablet Take 2,000 Units by mouth daily.  Marland Kitchen co-enzyme Q-10 30 MG capsule Take 30 mg by mouth 3 (three) times daily.  . Cranberry (SM CRANBERRY) 300 MG tablet Take 300 mg by mouth daily.  . DULoxetine (CYMBALTA) 30 MG capsule Take 1 capsule (30 mg total) by mouth 2 (two) times daily.  . irbesartan (AVAPRO) 75 MG tablet Take 37.5 mg by mouth at bedtime.   Marland Kitchen levothyroxine (SYNTHROID, LEVOTHROID) 112 MCG tablet Take 112 mcg by mouth daily before breakfast.  . MYRBETRIQ 25 MG TB24 tablet Take 25 mg by mouth daily.  . raloxifene (EVISTA) 60 MG tablet TAKE 1 TABLET (60 MG TOTAL) BY MOUTH DAILY.  . rivastigmine (EXELON) 9.5 mg/24hr PLACE 1 PATCH (9.5 MG TOTAL) ONTO THE SKIN DAILY.  . [DISCONTINUED] NONFORMULARY OR COMPOUNDED ITEM Estradiol 0.02 % (1 syringe) into the vagina at bedtime 2 times a week as directed.   No facility-administered encounter medications on file as of 09/04/2017.   :  Review of Systems:  Out of a complete 14 point review of systems, all are reviewed and negative with the exception of these symptoms as listed below:  Review of Systems  Neurological:       Pt presents today to discuss her PD.    Objective:  Neurological Exam  Physical Exam Physical Examination:   Vitals:   09/04/17 1440  BP: (!) 151/79  Pulse: 81    General Examination: The patient is a very pleasant 84 y.o. female in no acute  distress. She appears well-developed and well-nourished and well groomed.   HEENT: Normocephalic, atraumatic, pupils are equal, round and reactive to light and accommodation, she has mild saccadic breakdown on extraocular testing, she has mild limitation to upper gaze. She has a mildly decreased eye blink rate. Hearing is mildly impaired, face is symmetric with mild facial masking noted, normal facial sensation. She has moderate nuchal rigidity, decreased active and passive range of motion. Oropharynx exam reveals mild to moderate mouth dryness, tongue protrudes centrally and palate elevates symmetrically, she has no evidence of drooling, left shoulder slightly higher than right, not new.  Chest: is  Clear to auscultation without wheezing or crackles noted.  Heart: sounds are  regular without murmurs, rubs or gallops noted.   Abdomen: is soft, non-tender and non-distended with normal bowel sounds appreciated on auscultation.: Changes and updated.   Extremities: There is no pitting edema in the distal lower extremities bilaterally.   Skin: is warm and dry with no trophic changes noted. Age-related changes are noted on the skin. She wears an elastic sleeve on the right arm.    Musculoskeletal: exam reveals no obvious joint deformities, tenderness, joint swelling or erythema. Mild arthritic changes in her hands.   Neurologically:  Mental status: The patient is awake and alert, paying good attention. She is able to provide the history. Her husband provides more details. They have multiple questions written down on a note pad, which we reviewed. She is oriented to time, place, date, situation, month and year. She has fairly well-preserved to mildly impaired attention, memory, language and knowledge. She has no significant problems naming, she has mild bradyphrenia. Speech is mild to moderately hypophonic with no dysarthria noted. Mood is congruent and affect is normal.     Cranial nerves are  as described above under HEENT exam.   Motor exam: Normal bulk, and strength for age is noted. Strength and tone are same as last time, very mild rigidity noted in the right upper extremity, minimal cogwheeling noted, overall mild to moderate bradykinesia, no dyskinesias noted. She has no drift or rebound, she has no significant tremor, Romberg is not testable safely. She has intact sensation in the upper and lower extremities, to light touch.  Fine motor skills are mild to moderately impaired, right perhaps worse than left. Cerebellar testing shows no additional signs of dysmetria or intention tremor, no ataxia.   Gait, station and balance:  she stands up with difficulty, has mild to moderate difficulty with standing up, has to push herself up, requires no help, posture is moderately stooped, she walks slowly with mild start hesitation, no freezing noted. No telltale shuffling, but does not pick up her right foot as well. Balance is impaired. She was able to walk a little bit without her 4 point cane.  Assessment and Plan:    In summary, Michelle Sims is an 84 year old female with an underlying medical history of chronic back pain, hypertension, thyroid cancer, breast cancer, status post thyroidectomy, tonsillectomy, appendectomy, tubal ligation and mastectomy with reconstruction, who presents for follow-up consultation of her parkinsonism, with some right-sided lateralization, symptoms dating back to several years ago. She has probable right-sided predominant Parkinson's disease, no tremor predominance noted. Her exam has been stable for quite some time, she has overall mild progression with time. She was not able to tolerate Sinemet 1-1/2 pills 4 times a day and decreased it back to 1-1/2 pills alternating with one pill for 4 doses. She is also taking Sinemet CR at bedtime. She is trying to stay active mentally and physically. She has had some mild memory loss. She is back on Exelon patch per  primary care physician. She had also seen Dr. Carles Sims in the interim for the past 1 1/2 years, was advised to try to taper off Sinemet and per patient and has been she felt worse off of the medication. She felt improved after restarting it. She had some nonspecific difficulty after coming off of it and continues to tolerate Sinemet immediate release and long-acting fairly well at the current dose. We mutually agreed to continue with this. She  has sleep evaluation pending soon. I advised her to follow-up routinely in 6 months with Ward Givens, nurse practitioner. I answered all their questions today and the patient and her husband were in agreement. I spent 25 minutes in total face-to-face time with the patient, more than 50% of which was spent in counseling and coordination of care, reviewing test results, reviewing medication and discussing or reviewing the diagnosis of parkinsonism, the prognosis and treatment options. Pertinent laboratory and imaging test results that were available during this visit with the patient were reviewed by me and considered in my medical decision making (see chart for details).

## 2017-09-07 DIAGNOSIS — G4719 Other hypersomnia: Secondary | ICD-10-CM | POA: Diagnosis not present

## 2017-09-15 ENCOUNTER — Ambulatory Visit: Payer: BLUE CROSS/BLUE SHIELD | Admitting: Neurology

## 2017-09-20 DIAGNOSIS — F339 Major depressive disorder, recurrent, unspecified: Secondary | ICD-10-CM | POA: Diagnosis not present

## 2017-09-20 DIAGNOSIS — I10 Essential (primary) hypertension: Secondary | ICD-10-CM | POA: Diagnosis not present

## 2017-09-20 DIAGNOSIS — R413 Other amnesia: Secondary | ICD-10-CM | POA: Diagnosis not present

## 2017-09-20 DIAGNOSIS — E039 Hypothyroidism, unspecified: Secondary | ICD-10-CM | POA: Diagnosis not present

## 2017-09-29 ENCOUNTER — Telehealth: Payer: Self-pay | Admitting: Neurology

## 2017-09-29 NOTE — Telephone Encounter (Signed)
Pt's husband Erwin/DPR said the pt is having continued extreme fatigue in the morning, very hard to get her up out of the bed. It wears off as the day wears on. He said it is getting worse and she is also acknowledging this also. He is requesting to be called on his cell number 1st and can leave a msg on that number but wanted to give his work number of [phone] as an alternate number. He is aware the clinic closes at noon today. Please call to advise

## 2017-09-29 NOTE — Telephone Encounter (Addendum)
Dr. Rexene Alberts- please advise  Called husband back. Her fatigue has been gradually getting worse over the last year. Very hard for her to get up in the morning. Feels it is very debilitating at this point. By night, sx have improved. Only occurs when she gets up. She used to get up at 8 or 830am. Now gets up as late as 10am. "Feels worn out". Was checked for sleep apnea but was negative for this. He is wondering if its one of her medications or something else that is causing this. Would like Dr. Rexene Alberts to review and see if she has any other recommendations. Advised Dr. Rexene Alberts out of the office and will be back Monday. Will speak with her and call back next week to advise. He verbalized understanding.

## 2017-10-02 NOTE — Telephone Encounter (Signed)
I called Michelle Sims and explained Dr. Guadelupe Sabin recommendations to her. Michelle Sims reports that she already takes a short nap or two during the day. Michelle Sims verbalized understanding of this explanation. Michelle Sims had no questions at this time but was encouraged to call back if questions arise.

## 2017-10-02 NOTE — Telephone Encounter (Signed)
Unfortunately, fatigue is a common complaint with PD patients. More likely than not, it is from a combination of factors, including medications (in her case the Cymbalta, bladder med and Sinemet), ageing, and from the underlying PD itself as it progresses. Unfortunately, there is no quick fix or easy solution. She may have to adjust her sleep schedule and also take a short nap or two during the day. Pls advise patient or husband.

## 2017-10-03 ENCOUNTER — Ambulatory Visit (INDEPENDENT_AMBULATORY_CARE_PROVIDER_SITE_OTHER): Payer: BLUE CROSS/BLUE SHIELD | Admitting: Gynecology

## 2017-10-03 ENCOUNTER — Encounter: Payer: Self-pay | Admitting: Gynecology

## 2017-10-03 VITALS — BP 120/78 | Ht 62.5 in | Wt 133.0 lb

## 2017-10-03 DIAGNOSIS — M8580 Other specified disorders of bone density and structure, unspecified site: Secondary | ICD-10-CM | POA: Diagnosis not present

## 2017-10-03 DIAGNOSIS — Z853 Personal history of malignant neoplasm of breast: Secondary | ICD-10-CM | POA: Diagnosis not present

## 2017-10-03 DIAGNOSIS — N952 Postmenopausal atrophic vaginitis: Secondary | ICD-10-CM | POA: Diagnosis not present

## 2017-10-03 DIAGNOSIS — Z01419 Encounter for gynecological examination (general) (routine) without abnormal findings: Secondary | ICD-10-CM | POA: Diagnosis not present

## 2017-10-03 MED ORDER — RALOXIFENE HCL 60 MG PO TABS: ORAL_TABLET | ORAL | 3 refills | Status: DC

## 2017-10-03 NOTE — Progress Notes (Signed)
    Michelle Sims November 22, 1929 [id number]        84 y.o.  [id number] for annual gynecologic exam.  Several issues noted below.    Past medical history,surgical history, problem list, medications, allergies, family history and social history were all reviewed and documented as reviewed in the EPIC chart.  ROS:  Performed with pertinent positives and negatives included in the history, assessment and plan.   Additional significant findings : None   Exam: Copywriter, advertising Vitals:   10/03/17 1424  BP: 120/78  Weight: 133 lb (60.3 kg)  Height: 5' 2.5" (1.588 m)   Body mass index is 23.94 kg/m.  General appearance:  Normal affect, orientation and appearance. Skin: Grossly normal HEENT: Without gross lesions.  No cervical or supraclavicular adenopathy. Thyroid normal.  Lungs:  Clear without wheezing, rales or rhonchi Cardiac: RR, without RMG Abdominal:  Soft, nontender, without masses, guarding, rebound, organomegaly or hernia Breasts:  Examined lying and sitting.  Left without masses, retractions, discharge or axillary adenopathy.  Right status post reconstruction.  No masses or adenopathy Pelvic:  Ext, BUS, Vagina: With atrophic changes.  Narrowing mid to upper vagina.  First to second-degree rectocele noted.  Cervix: Not visualized due to vaginal narrowing  Uterus: Difficult to palpate but no gross masses or tenderness  Adnexa: Without gross masses or tenderness    Anus and perineum: Normal   Rectovaginal: Normal sphincter tone without palpated masses or tenderness.    Assessment/Plan:  84 y.o. [id number] female for annual gynecologic exam.   1. Postmenopausal/atrophic genital changes.  Had been using vaginal estradiol cream twice weekly.  She does have a history of breast cancer we have discussed on multiple occasions the risks of absorption and systemic effects to include her breast cancer.  At this point does not appear that she is using this now.  Will call if becomes an issue and  she needs a refill. 2. Osteopenia.  DEXA 2017 T score -2.1.  Continues on Evista.  Recommend follow-up DEXA this fall when she does her mammogram.  Refill Evista x1 year.  Risks of thrombosis have been discussed. 3. Pap smear 2013.  No Pap smear done today.  No history of abnormal Pap smears.  Per current screening guidelines we both agree to stop screening based on age. 4. Colonoscopy 2010.  Repeat at their recommended interval. 5. History of breast cancer.  Mammography coming due in October and I reminded her to schedule this.  Breast exam normal today.  Exam NED. 6. Health maintenance.  No routine lab work done as patient does this elsewhere.  Follow-up 1 year, sooner as needed.   [name] MD, 3:06 PM 10/03/2017

## 2017-10-03 NOTE — Patient Instructions (Signed)
[facility] and bone density this fall.

## 2017-10-17 DIAGNOSIS — R5382 Chronic fatigue, unspecified: Secondary | ICD-10-CM | POA: Diagnosis not present

## 2017-10-17 DIAGNOSIS — H6121 Impacted cerumen, right ear: Secondary | ICD-10-CM | POA: Diagnosis not present

## 2017-10-17 DIAGNOSIS — E559 Vitamin D deficiency, unspecified: Secondary | ICD-10-CM | POA: Diagnosis not present

## 2017-10-17 DIAGNOSIS — J309 Allergic rhinitis, unspecified: Secondary | ICD-10-CM | POA: Diagnosis not present

## 2017-11-08 ENCOUNTER — Encounter: Payer: Self-pay | Admitting: Adult Health

## 2017-11-08 ENCOUNTER — Telehealth: Payer: Self-pay | Admitting: Adult Health

## 2017-11-08 ENCOUNTER — Ambulatory Visit (INDEPENDENT_AMBULATORY_CARE_PROVIDER_SITE_OTHER): Payer: BLUE CROSS/BLUE SHIELD | Admitting: Adult Health

## 2017-11-08 VITALS — BP 142/83 | HR 81 | Ht 62.0 in | Wt 134.0 lb

## 2017-11-08 DIAGNOSIS — G20 Parkinson's disease: Secondary | ICD-10-CM | POA: Diagnosis not present

## 2017-11-08 DIAGNOSIS — H26493 Other secondary cataract, bilateral: Secondary | ICD-10-CM | POA: Diagnosis not present

## 2017-11-08 NOTE — Patient Instructions (Signed)
Your Plan:  Continue Sinemet and Sinemet CR Can try reducing Sinemet to 1 tablet four times a day Stay well hydrated  If your symptoms worsen or you develop new symptoms please let us know.   Thank you for coming to see [place] at [facility]. I hope we have been able to provide you high quality care today.  You may receive a patient satisfaction survey over the next few weeks. We would appreciate your feedback and comments so that we may continue to improve ourselves and the health of our patients.

## 2017-11-08 NOTE — Progress Notes (Addendum)
PATIENT: Michelle Sims DOB: Apr 13, 1929  REASON FOR VISIT: follow up HISTORY FROM: patient  HISTORY OF PRESENT ILLNESS: Today 11/08/17:  Michelle Sims is an 84 year old female with a history of Parkinson's disease.  She is currently on Sinemet 1-1/2 tablet alternating with 1 tablet for 4 doses.  She takes Sinemet CR at bedtime.  The patient denies tremor.  She reports that she sometimes will have shuffling gait but she consciously tries to avoid this.  She denies any falls.  She does use a cane when ambulating.  Denies any trouble chewing or swallowing food.  She reports that she sometimes has trouble falling asleep.  She often wakes up 3-4 times to urinate.  Her husband states that she does not stay well-hydrated during the day.  She does participate in exercise classes at the facility.  She returns today for evaluation.  HISTORY 09/04/2017: She reports doing okay. She occasionally feels confused, she occasionally feels foggy headed. She still suffers from lymphedema and had an infection in the interim. Has received a new compression sleeve. She still has quite a bit of fatigue on a day-to-day basis. She saw geriatric specialist at [facility] in the interim and was advised to come off of some of her medications. She had an interim brain MRI on 05/20/2017 and I reviewed the results: IMPRESSION:  No acute intracranial abnormality. Nonspecific global atrophy. Extensive white matter changes throughout the brain. This most commonly reflects microangiopathic changes.  She was seen in the interim by [name] on several occasions. She was advised to taper off of Sinemet. She was started on Exelon patch for memory loss. She was unable to tolerate this. Her primary care physician took her off of it as I understand.   She is on C/L 1  pills alternating with 1 pill for 4 doses and CR at night. She has restarted Exelon. She has been in PT. Has sleep eval pending with [name]. Gave up driving.  She is still fatigued. Not sleeping well, has not tried Melatonin.   The patient's allergies, current medications, family history, past medical history, past social history, past surgical history and problem list were reviewed and updated as appropriate.  REVIEW OF SYSTEMS: Out of a complete 14 system review of symptoms, the patient complains only of the following symptoms, and all other reviewed systems are negative.  See HPI  ALLERGIES: Allergies  Allergen Reactions  . Sulfa Antibiotics Rash  . Biaxin [Clarithromycin] Other (See Comments)    Pt does not remember reaction   . Ciprofloxacin Nausea And Vomiting  . Hctz [Hydrochlorothiazide] Other (See Comments)    weakness and hyponatremia  . Macrobid [Nitrofurantoin] Nausea Only  . Other Nausea Only  . Prednisone Other (See Comments)    Swelling and facial redness   . Sulfamethoxazole-Trimethoprim Other (See Comments) and Rash    Sore throat    HOME MEDICATIONS: Outpatient Medications Prior to Visit  Medication Sig Dispense Refill  . amLODipine (NORVASC) 2.5 MG tablet Take 2.5 mg by mouth daily.    . Ascorbic Acid (VITAMIN C PO) Take by mouth.    . Calcium Carb-Cholecalciferol (CALCIUM 600 + D PO) Take 1 tablet by mouth daily with lunch.    . carbidopa-levodopa (SINEMET IR) 25-100 MG tablet TAKE 1 & 1/2 TAB IN THE AM, 1 TAB MID AM, 1 & 1/2 TAB IN THE AFTERNOON, 1 TAB IN THE EVENING 450 tablet 5  . Carbidopa-Levodopa ER (SINEMET CR) 25-100 MG tablet controlled  release TAKE 1 TABLET BY MOUTH EVERYDAY AT BEDTIME 90 tablet 1  . cholecalciferol (VITAMIN D) 1000 units tablet Take 2,000 Units by mouth daily.    Marland Kitchen co-enzyme Q-10 30 MG capsule Take 30 mg by mouth 3 (three) times daily.    . Cranberry (SM CRANBERRY) 300 MG tablet Take 300 mg by mouth daily.    . DULoxetine (CYMBALTA) 30 MG capsule Take 1 capsule (30 mg total) by mouth 2 (two) times daily. 180 capsule 0  . irbesartan (AVAPRO) 75 MG tablet Take 37.5 mg by mouth at  bedtime.     Marland Kitchen levothyroxine (SYNTHROID, LEVOTHROID) 112 MCG tablet Take 112 mcg by mouth daily before breakfast.    . MYRBETRIQ 25 MG TB24 tablet Take 25 mg by mouth daily.  1  . raloxifene (EVISTA) 60 MG tablet TAKE 1 TABLET (60 MG TOTAL) BY MOUTH DAILY. 90 tablet 3  . rivastigmine (EXELON) 9.5 mg/24hr PLACE 1 PATCH (9.5 MG TOTAL) ONTO THE SKIN DAILY. 90 patch 1   No facility-administered medications prior to visit.     PAST MEDICAL HISTORY: Past Medical History:  Diagnosis Date  . Anxiety   . Arthritis    knee & back   . Blood type, Rh negative   . Breast cancer ([place])   . Chronic fatigue   . Depression   . Diverticulosis   . Endocervical polyp   . GERD (gastroesophageal reflux disease)   . Hypertension   . Hypothyroidism   . IBS (irritable bowel syndrome)   . Insomnia   . Lymphedema   . Neuromuscular disorder ([place])    Parkinson's disease   . Osteopenia 06/2015   T score -2.4 distal third of the radius  . Parkinson disease ([place])   . RBBB 10/07/2013  . Thyroid cancer ([place])     PAST SURGICAL HISTORY: Past Surgical History:  Procedure Laterality Date  . APPENDECTOMY  1979  . BREAST SURGERY  1979   MASTECTOMY AND RECONSTRUCTION  . DILATION AND CURETTAGE OF UTERUS     MISCARRIAGE  . EYE SURGERY Bilateral    w/ IOL  . HYSTEROSCOPY  [id number]   AND D&C  . MASTECTOMY Bilateral   . THYROIDECTOMY    . TONSILLECTOMY    . TOTAL KNEE ARTHROPLASTY Right 10/07/2013   Procedure: RIGHT TOTAL KNEE ARTHROPLASTY;  Surgeon: Kerin Salen, MD;  Location: [place];  Service: Orthopedics;  Laterality: Right;  . TUBAL LIGATION      FAMILY HISTORY: Family History  Problem Relation Age of Onset  . Hypertension Mother   . Hyperlipidemia Mother   . Heart failure Mother   . Hypertension Father   . Hyperlipidemia Father   . Heart failure Father   . Hyperlipidemia Brother   . Hypertension Brother   . Heart attack Brother   . Breast cancer Neg Hx     SOCIAL HISTORY: Social History    Socioeconomic History  . Marital status: Married    Spouse name: Not on file  . Number of children: Not on file  . Years of education: Not on file  . Highest education level: Not on file  Occupational History  . Occupation: retired    Comment: "odd jobs"  Social Needs  . Financial resource strain: Not on file  . Food insecurity:    Worry: Not on file    Inability: Not on file  . Transportation needs:    Medical: Not on file    Non-medical: Not on file  Tobacco  Use  . Smoking status: Never Smoker  . Smokeless tobacco: Never Used  Substance and Sexual Activity  . Alcohol use: Yes    Alcohol/week: 0.0 standard drinks    Comment: rare  . Drug use: No  . Sexual activity: Never    Birth control/protection: Surgical, Post-menopausal    Comment: 1st intercourse 84 yo-Fewer than 5 partners  Lifestyle  . Physical activity:    Days per week: Not on file    Minutes per session: Not on file  . Stress: Not on file  Relationships  . Social connections:    Talks on phone: Not on file    Gets together: Not on file    Attends religious service: Not on file    Active member of club or organization: Not on file    Attends meetings of clubs or organizations: Not on file    Relationship status: Not on file  . Intimate partner violence:    Fear of current or ex partner: Not on file    Emotionally abused: Not on file    Physically abused: Not on file    Forced sexual activity: Not on file  Other Topics Concern  . Not on file  Social History Narrative  . Not on file      PHYSICAL EXAM  Vitals:   11/08/17 1122  BP: (!) 142/83  Pulse: 81  Weight: 134 lb (60.8 kg)  Height: 5\' 2"  (1.575 m)   Body mass index is 24.51 kg/m.  Generalized: Well developed, in no acute distress   Neurological examination  Mentation: Alert oriented to time, place, history taking. Follows all commands speech and language fluent Cranial nerve II-XII: Pupils were equal round reactive to light.  Extraocular movements were full, visual field were full on confrontational test. Facial sensation and strength were normal. Uvula tongue midline. Head turning and shoulder shrug  were normal and symmetric. Motor: The motor testing reveals 5 over 5 strength of all 4 extremities.  Very mild rigidity noted in the upper extremities. Sensory: Sensory testing is intact to soft touch on all 4 extremities. No evidence of extinction is noted.  Coordination: Cerebellar testing reveals good finger-nose-finger and heel-to-shin bilaterally.  Gait and station: Patient is able to stand without assistance.  Stride is slightly decreased.  Decreased arm swing.  4-5 steps with turns. Reflexes: Deep tendon reflexes are symmetric and normal bilaterally.   DIAGNOSTIC DATA (LABS, IMAGING, TESTING) - I reviewed patient records, labs, notes, testing and imaging myself where available.  Lab Results  Component Value Date   WBC 8.6 10/15/2013   HGB 10.4 (A) 10/15/2013   HCT 31 (A) 10/15/2013   MCV 92.5 10/10/2013   PLT 314 10/15/2013      Component Value Date/Time   NA 139 10/15/2013   K 3.9 10/15/2013   CL 101 10/08/2013 0440   CO2 24 10/08/2013 0440   GLUCOSE 151 (H) 10/08/2013 0440   BUN 18 10/15/2013   CREATININE 0.7 10/15/2013   CREATININE 0.57 10/08/2013 0440   CALCIUM 8.1 (L) 10/08/2013 0440   GFRNONAA 84 (L) 10/08/2013 0440   GFRAA >90 10/08/2013 0440   No results found for: CHOL, HDL, LDLCALC, LDLDIRECT, TRIG, CHOLHDL Lab Results  Component Value Date   HGBA1C 5.6 10/15/2013   No results found for: VITAMINB12 No results found for: TSH    ASSESSMENT AND PLAN 84 y.o. year old female  has a past medical history of Anxiety, Arthritis, Blood type, Rh negative, Breast cancer ([place]), Chronic  fatigue, Depression, Diverticulosis, Endocervical polyp, GERD (gastroesophageal reflux disease), Hypertension, Hypothyroidism, IBS (irritable bowel syndrome), Insomnia, Lymphedema, Neuromuscular disorder ([place]),  Osteopenia (06/2015), Parkinson disease ([place]), RBBB (10/07/2013), and Thyroid cancer ([place]). here with:  1.  Parkinson's disease  Overall the patient has remained stable.  The husband is questioning whether she should decrease her dose of Sinemet.  Advised that they can do a trial with Sinemet immediate release 1 tablet 4 times a day.  They should continue taking Sinemet controlled release at bedtime.  Husband voiced understanding.  I advised the patient to stay well-hydrated.  We talked about establishing a good sleep routine and sleep hygiene.  Advised that if her symptoms worsen or she develops new symptoms they should let us know.  We will follow-up in 6 months or sooner if needed.   Ward Givens, MSN, NP-C 11/08/2017, 11:40 AM [facility] [address] [place], [place] [address] [phone]  I reviewed the above note and documentation by the Nurse Practitioner and agree with the history, physical exam, assessment and plan as outlined above.  Star Age, MD, PhD [facility])

## 2017-11-08 NOTE — Telephone Encounter (Signed)
Patient has requested for MM to call, patient is unsure about continuing her exalon patch.

## 2017-11-08 NOTE — Telephone Encounter (Signed)
I spoke with Jinny Blossom, NP. Since [name] is managing pt's exelon patch and pt has seen Dr. Joella Prince, geriatric medicine at [facility], who has made recommendations on pt's exelon patch, Megan, NP recommends that pt follow up regarding the exelon patch with those offices. However, exelon is helpful with memory and for PD.   I called pt and advised her of this. Pt will pass this information on to her husband and verbalized understanding.

## 2017-11-15 DIAGNOSIS — Z23 Encounter for immunization: Secondary | ICD-10-CM | POA: Diagnosis not present

## 2017-11-30 DIAGNOSIS — H26492 Other secondary cataract, left eye: Secondary | ICD-10-CM | POA: Diagnosis not present

## 2017-12-02 ENCOUNTER — Other Ambulatory Visit: Payer: Self-pay | Admitting: Neurology

## 2017-12-02 DIAGNOSIS — G20 Parkinson's disease: Secondary | ICD-10-CM

## 2017-12-05 ENCOUNTER — Other Ambulatory Visit: Payer: Self-pay | Admitting: Neurology

## 2017-12-05 DIAGNOSIS — G20 Parkinson's disease: Secondary | ICD-10-CM

## 2017-12-12 ENCOUNTER — Other Ambulatory Visit: Payer: Self-pay | Admitting: Internal Medicine

## 2017-12-12 DIAGNOSIS — Z1231 Encounter for screening mammogram for malignant neoplasm of breast: Secondary | ICD-10-CM

## 2017-12-14 DIAGNOSIS — H26491 Other secondary cataract, right eye: Secondary | ICD-10-CM | POA: Diagnosis not present

## 2017-12-26 ENCOUNTER — Ambulatory Visit: Payer: BLUE CROSS/BLUE SHIELD | Admitting: Women's Health

## 2017-12-27 ENCOUNTER — Other Ambulatory Visit: Payer: Self-pay | Admitting: Internal Medicine

## 2017-12-27 DIAGNOSIS — G20 Parkinson's disease: Secondary | ICD-10-CM | POA: Diagnosis not present

## 2017-12-27 DIAGNOSIS — Z13820 Encounter for screening for osteoporosis: Secondary | ICD-10-CM

## 2017-12-27 DIAGNOSIS — N390 Urinary tract infection, site not specified: Secondary | ICD-10-CM | POA: Diagnosis not present

## 2017-12-27 DIAGNOSIS — R5382 Chronic fatigue, unspecified: Secondary | ICD-10-CM | POA: Diagnosis not present

## 2017-12-27 DIAGNOSIS — E039 Hypothyroidism, unspecified: Secondary | ICD-10-CM | POA: Diagnosis not present

## 2018-01-05 ENCOUNTER — Ambulatory Visit
Admission: RE | Admit: 2018-01-05 | Discharge: 2018-01-05 | Disposition: A | Discharge status: Home / Self Care | Payer: BLUE CROSS/BLUE SHIELD | Source: Ambulatory Visit | Attending: Internal Medicine | Admitting: Internal Medicine

## 2018-01-05 DIAGNOSIS — Z1231 Encounter for screening mammogram for malignant neoplasm of breast: Secondary | ICD-10-CM

## 2018-01-15 ENCOUNTER — Ambulatory Visit: Payer: BLUE CROSS/BLUE SHIELD | Admitting: Sports Medicine

## 2018-01-22 ENCOUNTER — Encounter

## 2018-01-22 ENCOUNTER — Ambulatory Visit: Payer: BLUE CROSS/BLUE SHIELD | Admitting: Adult Health

## 2018-01-25 ENCOUNTER — Ambulatory Visit: Payer: BLUE CROSS/BLUE SHIELD

## 2018-01-30 ENCOUNTER — Ambulatory Visit: Payer: BLUE CROSS/BLUE SHIELD | Admitting: Adult Health

## 2018-02-08 ENCOUNTER — Ambulatory Visit (INDEPENDENT_AMBULATORY_CARE_PROVIDER_SITE_OTHER): Payer: BLUE CROSS/BLUE SHIELD | Admitting: Gynecology

## 2018-02-08 ENCOUNTER — Telehealth: Payer: Self-pay | Admitting: *Deleted

## 2018-02-08 ENCOUNTER — Other Ambulatory Visit: Payer: Self-pay | Admitting: Internal Medicine

## 2018-02-08 ENCOUNTER — Encounter: Payer: Self-pay | Admitting: Gynecology

## 2018-02-08 VITALS — BP 122/74

## 2018-02-08 DIAGNOSIS — R300 Dysuria: Secondary | ICD-10-CM

## 2018-02-08 DIAGNOSIS — R102 Pelvic and perineal pain: Secondary | ICD-10-CM

## 2018-02-08 DIAGNOSIS — M8580 Other specified disorders of bone density and structure, unspecified site: Secondary | ICD-10-CM

## 2018-02-08 MED ORDER — CEPHALEXIN 250 MG PO CAPS: 250.0000 mg | ORAL_CAPSULE | Freq: Three times a day (TID) | ORAL | 0 refills | Status: DC

## 2018-02-08 NOTE — Addendum Note (Signed)
Addended by: Nelva Nay on: 02/08/2018 12:58 PM   Modules accepted: Orders

## 2018-02-08 NOTE — Telephone Encounter (Signed)
Okay for Keflex 250 mg 3 times daily x7 days this way we will go with a little bit lower dose but keep it 3 times daily and extended by 2 days.

## 2018-02-08 NOTE — Progress Notes (Signed)
    MACAYLA EKDAHL [birth year] [id number]        85 y.o.  H6P5916 presents complaining of some dysuria starting yesterday morning but resolved during the day.  Occurred again this morning.  Also notes some pelvic pressure and pelvic bloating.    Past medical history,surgical history, problem list, medications, allergies, family history and social history were all reviewed and documented in the EPIC chart.  Directed ROS with pertinent positives and negatives documented in the history of present illness/assessment and plan.  Exam: [name] assistant Vitals:   02/08/18 1133  BP: 122/74   General appearance:  Normal Abdomen soft nontender without masses guarding rebound Pelvic external BUS vagina with atrophic changes.  Cervix not visualized due to vaginal narrowing.  Uterus difficult to palpate but no gross masses or tenderness.  Adnexa without masses or tenderness.  Rectal exam shows first to second-degree rectocele otherwise normal.  Assessment/Plan:  85 y.o. [id number] with mild dysuria 2 mornings in a row.  No other associated symptoms such as frequency urgency low back pain fever or chills.  Has tried to get a urine specimen several times in the office.  She is going to go home and get a specimen and her husband will bring her back in today.  Also complaining of pelvic pressure.  Exam is without masses or tenderness.  Recommend surveillance ultrasound and she will go ahead and schedule and follow-up for this.    [name] MD, 11:56 AM 02/08/2018

## 2018-02-08 NOTE — Telephone Encounter (Signed)
Spoke with [name] ( husband) and they have the Keflex 500 mg dose at home.

## 2018-02-08 NOTE — Telephone Encounter (Signed)
Spoke with husband and he said the 3 times daily x 5 days is a "powerful" dose, states patient is very "sensitive" to antibiotics. Asked if the frequency could be twice daily? States he is not trying to tell you how to prescribe medication, but he knows patient will not do well with 3 times daily. He also said he will need a Rx because he doesn't have enough pills, the other Rx is expired. Please advise

## 2018-02-08 NOTE — Telephone Encounter (Signed)
Michelle Sims informed with Rx, Rx sent.

## 2018-02-08 NOTE — Telephone Encounter (Signed)
-----   Message from [name], MD sent at 02/08/2018 12:14 PM EST ----- Call patient (husband) and tell them it does look like she has a urinary tract infection.  He said he had Keflex at home.  Find out what milligram strength they have and then I can tell them how to take it.

## 2018-02-08 NOTE — Patient Instructions (Signed)
Follow-up with urine specimen  Follow-up for the pelvic ultrasound as scheduled

## 2018-02-08 NOTE — Telephone Encounter (Signed)
Recommend 1 tablet 3 times daily for 5 days

## 2018-02-10 LAB — URINE CULTURE
MICRO NUMBER: 70631
SPECIMEN QUALITY:: ADEQUATE

## 2018-02-10 LAB — URINALYSIS, COMPLETE W/RFL CULTURE
Bilirubin Urine: NEGATIVE
GLUCOSE, UA: NEGATIVE
Hyaline Cast: NONE SEEN /LPF
Ketones, ur: NEGATIVE
NITRITES URINE, INITIAL: NEGATIVE
PH: 6 (ref 5.0–8.0)
Protein, ur: NEGATIVE
Specific Gravity, Urine: 1.002 (ref 1.001–1.03)

## 2018-02-10 LAB — CULTURE INDICATED

## 2018-02-21 ENCOUNTER — Other Ambulatory Visit: Payer: BLUE CROSS/BLUE SHIELD

## 2018-02-21 DIAGNOSIS — R5382 Chronic fatigue, unspecified: Secondary | ICD-10-CM | POA: Diagnosis not present

## 2018-02-21 DIAGNOSIS — R413 Other amnesia: Secondary | ICD-10-CM | POA: Diagnosis not present

## 2018-02-21 DIAGNOSIS — G20 Parkinson's disease: Secondary | ICD-10-CM | POA: Diagnosis not present

## 2018-02-22 ENCOUNTER — Ambulatory Visit: Payer: Medicare Other | Admitting: Gynecology

## 2018-02-22 ENCOUNTER — Other Ambulatory Visit: Payer: BLUE CROSS/BLUE SHIELD

## 2018-02-22 ENCOUNTER — Telehealth: Payer: Self-pay | Admitting: *Deleted

## 2018-02-22 NOTE — Telephone Encounter (Addendum)
Called husband for more information. He stated she's having extreme fatigue in mornings, not a new problem. It's like she can't seem to wake up.  As day wears on it seems to lessen.  They've tried Megan's advice, but seen no improvement. Her gait is slowing down, legs feel stiffer.  She is more confused. He understands her condition is progressive, but stated he didn't think she should wait until April to be seen. He then voiced concern over whether the Exelon patch was useful. I advised him that I would let Michelle Sims know of his concerns. Either she will call him back or let me know how to advise him. He verbalized understanding, appreciation.

## 2018-02-22 NOTE — Telephone Encounter (Signed)
Pt husband called, requesting a sooner appt for his wife. He stated she needs to be seen asap.

## 2018-02-23 NOTE — Telephone Encounter (Signed)
Spoke with husband and advised we can offer sooner FU with Dr Rexene Alberts or Janett Billow NP. He prefers Dr Rexene Alberts because she is familiar with patient. He also stated he needs late morning or afternoon appointment. I scheduled for soonest he agreed upon. I also advised I put her on wait list for Dr Rexene Alberts. Patient's husband verbalized understanding, appreciation.

## 2018-02-23 NOTE — Telephone Encounter (Signed)
If he feels like she needs to be seen sooner we can try to get in her in with MD or another NP. I think I am booked.

## 2018-02-26 DIAGNOSIS — J069 Acute upper respiratory infection, unspecified: Secondary | ICD-10-CM | POA: Diagnosis not present

## 2018-02-26 DIAGNOSIS — R5382 Chronic fatigue, unspecified: Secondary | ICD-10-CM | POA: Diagnosis not present

## 2018-02-26 DIAGNOSIS — R413 Other amnesia: Secondary | ICD-10-CM | POA: Diagnosis not present

## 2018-02-26 DIAGNOSIS — G20 Parkinson's disease: Secondary | ICD-10-CM | POA: Diagnosis not present

## 2018-03-12 ENCOUNTER — Ambulatory Visit: Payer: Medicare Other | Admitting: Gynecology

## 2018-03-12 ENCOUNTER — Other Ambulatory Visit: Payer: BLUE CROSS/BLUE SHIELD

## 2018-03-19 DIAGNOSIS — Z85828 Personal history of other malignant neoplasm of skin: Secondary | ICD-10-CM | POA: Diagnosis not present

## 2018-03-19 DIAGNOSIS — L821 Other seborrheic keratosis: Secondary | ICD-10-CM | POA: Diagnosis not present

## 2018-03-20 ENCOUNTER — Other Ambulatory Visit: Payer: Self-pay

## 2018-03-20 MED ORDER — NONFORMULARY OR COMPOUNDED ITEM: 4 refills | Status: DC

## 2018-03-20 NOTE — Telephone Encounter (Signed)
Called into pharmacy

## 2018-03-27 DIAGNOSIS — Z0000 Encounter for general adult medical examination without abnormal findings: Secondary | ICD-10-CM | POA: Diagnosis not present

## 2018-03-29 ENCOUNTER — Ambulatory Visit (INDEPENDENT_AMBULATORY_CARE_PROVIDER_SITE_OTHER): Payer: BLUE CROSS/BLUE SHIELD | Admitting: Gynecology

## 2018-03-29 ENCOUNTER — Telehealth: Payer: Self-pay | Admitting: *Deleted

## 2018-03-29 ENCOUNTER — Encounter: Payer: Self-pay | Admitting: Gynecology

## 2018-03-29 VITALS — BP 124/78

## 2018-03-29 DIAGNOSIS — N3000 Acute cystitis without hematuria: Secondary | ICD-10-CM | POA: Diagnosis not present

## 2018-03-29 DIAGNOSIS — R300 Dysuria: Secondary | ICD-10-CM | POA: Diagnosis not present

## 2018-03-29 MED ORDER — FOSFOMYCIN TROMETHAMINE 3 G PO PACK: PACK | ORAL | 0 refills | Status: DC

## 2018-03-29 MED ORDER — FOSFOMYCIN TROMETHAMINE 3 G PO PACK: 3.0000 g | PACK | Freq: Once | ORAL | Status: DC

## 2018-03-29 NOTE — Patient Instructions (Signed)
Take the prescribed antibiotic once.

## 2018-03-29 NOTE — Telephone Encounter (Signed)
Patient was seen today and Rx for Monurol 3g was not sent to pharmacy. I was prescribed as clinic dispense medication. Rx sent.

## 2018-03-29 NOTE — Progress Notes (Signed)
    Michelle Sims Oct 03, 1929 [id number]        85 y.o.  H4L9379 presents with a 1 day history of urinary frequency,, urgency mild suprapubic discomfort and urinary incontinence.  No fever or chills.  No low back pain.  History of recurrent UTIs in the past.  Past medical history,surgical history, problem list, medications, allergies, family history and social history were all reviewed and documented in the EPIC chart.  Directed ROS with pertinent positives and negatives documented in the history of present illness/assessment and plan.  Exam: Vitals:   03/29/18 1207  BP: 124/78   General appearance:  Normal Spine straight without CVA tenderness Abdomen soft nontender without masses guarding rebound  Assessment/Plan:  85 y.o. [id number] with 1 day history of urinary tract symptoms.  Urine analysis does show many bacteria with packed WBCs 6-10 epithelial cells consistent with UTI.  The patient has a lot of sensitivities to antibiotics.  I discussed with her husband several different choices and we ultimately will try Monurol x1 dose.  I did recommend that they call me next week assuming that the antibiotic works well and I will give them a refill to have on hand for any recurrent symptoms.  Patient will follow-up if she would persist in her symptoms or they worsen.    [name] MD, 12:32 PM 03/29/2018

## 2018-03-31 LAB — URINE CULTURE
MICRO NUMBER:: 286433
SPECIMEN QUALITY:: ADEQUATE

## 2018-03-31 LAB — URINALYSIS, COMPLETE W/RFL CULTURE
Bilirubin Urine: NEGATIVE
Hyaline Cast: NONE SEEN /LPF
Nitrites, Initial: POSITIVE — AB
Specific Gravity, Urine: 1.02 (ref 1.001–1.03)
pH: 5.5 (ref 5.0–8.0)

## 2018-03-31 LAB — CULTURE INDICATED

## 2018-04-09 ENCOUNTER — Other Ambulatory Visit: Payer: BLUE CROSS/BLUE SHIELD

## 2018-04-19 ENCOUNTER — Encounter

## 2018-04-19 ENCOUNTER — Ambulatory Visit: Payer: Self-pay | Admitting: Neurology

## 2018-04-24 ENCOUNTER — Other Ambulatory Visit: Payer: Self-pay | Admitting: Neurology

## 2018-05-22 ENCOUNTER — Ambulatory Visit: Payer: BLUE CROSS/BLUE SHIELD | Admitting: Adult Health

## 2018-05-22 ENCOUNTER — Encounter

## 2018-05-30 ENCOUNTER — Other Ambulatory Visit: Payer: Self-pay | Admitting: Neurology

## 2018-05-30 DIAGNOSIS — G20 Parkinson's disease: Secondary | ICD-10-CM

## 2018-05-31 ENCOUNTER — Other Ambulatory Visit: Payer: Self-pay

## 2018-06-20 DIAGNOSIS — N390 Urinary tract infection, site not specified: Secondary | ICD-10-CM | POA: Diagnosis not present

## 2018-06-20 DIAGNOSIS — N952 Postmenopausal atrophic vaginitis: Secondary | ICD-10-CM | POA: Diagnosis not present

## 2018-06-20 DIAGNOSIS — N3941 Urge incontinence: Secondary | ICD-10-CM | POA: Diagnosis not present

## 2018-06-20 DIAGNOSIS — G20 Parkinson's disease: Secondary | ICD-10-CM | POA: Diagnosis not present

## 2018-07-12 DIAGNOSIS — Z85828 Personal history of other malignant neoplasm of skin: Secondary | ICD-10-CM | POA: Diagnosis not present

## 2018-07-12 DIAGNOSIS — L821 Other seborrheic keratosis: Secondary | ICD-10-CM | POA: Diagnosis not present

## 2018-07-12 DIAGNOSIS — D692 Other nonthrombocytopenic purpura: Secondary | ICD-10-CM | POA: Diagnosis not present

## 2018-07-18 ENCOUNTER — Ambulatory Visit: Payer: BLUE CROSS/BLUE SHIELD | Admitting: Adult Health

## 2018-07-23 DIAGNOSIS — G20 Parkinson's disease: Secondary | ICD-10-CM | POA: Diagnosis not present

## 2018-07-25 ENCOUNTER — Encounter: Payer: Self-pay | Admitting: Women's Health

## 2018-07-25 ENCOUNTER — Telehealth: Payer: Self-pay | Admitting: *Deleted

## 2018-07-25 ENCOUNTER — Other Ambulatory Visit: Payer: Self-pay | Admitting: Women's Health

## 2018-07-25 ENCOUNTER — Ambulatory Visit (INDEPENDENT_AMBULATORY_CARE_PROVIDER_SITE_OTHER): Payer: BC Managed Care – PPO | Admitting: Women's Health

## 2018-07-25 ENCOUNTER — Other Ambulatory Visit: Payer: Self-pay

## 2018-07-25 VITALS — BP 122/78

## 2018-07-25 DIAGNOSIS — N3000 Acute cystitis without hematuria: Secondary | ICD-10-CM | POA: Diagnosis not present

## 2018-07-25 DIAGNOSIS — R350 Frequency of micturition: Secondary | ICD-10-CM

## 2018-07-25 MED ORDER — FOSFOMYCIN TROMETHAMINE 3 G PO PACK: PACK | ORAL | 0 refills | Status: DC

## 2018-07-25 MED ORDER — FOSFOMYCIN TROMETHAMINE 3 G PO PACK: 3.0000 g | PACK | Freq: Once | ORAL | Status: DC

## 2018-07-25 NOTE — Patient Instructions (Signed)

## 2018-07-25 NOTE — Progress Notes (Signed)
History: 85 year old MWF G4P3 presents with 2 days of dysuria and increased urinary incontinence. Has history of recurrent UTI's and is followed by urologist. Has difficulty drinking adequate fluids due to her incontinence. Has allergy to Ciprofloxacin, Nitrofurantoin, and TMP/SMX. Has had Monurol for most recent UTI with good relief of symptoms.. Denies nausea, abomindal pain, hematuria, vaginal discharge, odor, itching, or burning.  Medical history includes- Parkinson's, HTN, Hypothyroid, Urinary Incontinence   Exam: Appears well. Abdomen soft and non-tender with no guarding or rebound. No CVAT.  UA- Positive Nitrite, 2+ Leukocytes, trace blood, packed WBC, 3-10 RBC, 6-10 sq epithelial, many bacteria.   Acute Cystitis with history of recurrent UTI's- followed by urologist  Urinary Incontinence  Parkinson's Disease   Plan: Monurol 3 g packet mixed with 4 ounces of water. Encouraged drinking a glass of water every other hour. Reviewed UTI prevention. Recommended follow-up with urologist. Instructed patient to call if her symptoms don't improve.

## 2018-07-25 NOTE — Telephone Encounter (Signed)
Patient called stating Rx from [facility] today was never received at [facility], Rx for Monurol 3 gram was ordered as clinic administered medication, correct Rx sent patient aware.

## 2018-07-25 NOTE — Addendum Note (Signed)
Addended by: Janalyn Harder A on: 07/25/2018 04:00 PM   Modules accepted: Orders

## 2018-07-27 LAB — URINALYSIS, COMPLETE W/RFL CULTURE
Bilirubin Urine: NEGATIVE
Glucose, UA: NEGATIVE
Hyaline Cast: NONE SEEN /LPF
Nitrites, Initial: POSITIVE — AB
Specific Gravity, Urine: 1.02 (ref 1.001–1.03)
pH: 6.5 (ref 5.0–8.0)

## 2018-07-27 LAB — URINE CULTURE
MICRO NUMBER:: 629838
SPECIMEN QUALITY:: ADEQUATE

## 2018-07-27 LAB — CULTURE INDICATED

## 2018-08-20 ENCOUNTER — Other Ambulatory Visit: Payer: Self-pay

## 2018-08-20 ENCOUNTER — Ambulatory Visit
Admission: RE | Admit: 2018-08-20 | Discharge: 2018-08-20 | Disposition: A | Discharge status: Home / Self Care | Payer: BC Managed Care – PPO | Source: Ambulatory Visit | Attending: Internal Medicine | Admitting: Internal Medicine

## 2018-08-20 DIAGNOSIS — M8589 Other specified disorders of bone density and structure, multiple sites: Secondary | ICD-10-CM | POA: Diagnosis not present

## 2018-08-20 DIAGNOSIS — Z780 Asymptomatic menopausal state: Secondary | ICD-10-CM | POA: Diagnosis not present

## 2018-08-20 DIAGNOSIS — M8580 Other specified disorders of bone density and structure, unspecified site: Secondary | ICD-10-CM

## 2018-09-12 DIAGNOSIS — G4700 Insomnia, unspecified: Secondary | ICD-10-CM | POA: Diagnosis not present

## 2018-09-12 DIAGNOSIS — G214 Vascular parkinsonism: Secondary | ICD-10-CM | POA: Diagnosis not present

## 2018-09-12 DIAGNOSIS — R413 Other amnesia: Secondary | ICD-10-CM | POA: Diagnosis not present

## 2018-09-12 DIAGNOSIS — R5382 Chronic fatigue, unspecified: Secondary | ICD-10-CM | POA: Diagnosis not present

## 2018-09-13 DIAGNOSIS — F4323 Adjustment disorder with mixed anxiety and depressed mood: Secondary | ICD-10-CM | POA: Diagnosis not present

## 2018-09-18 DIAGNOSIS — D692 Other nonthrombocytopenic purpura: Secondary | ICD-10-CM | POA: Diagnosis not present

## 2018-09-18 DIAGNOSIS — L821 Other seborrheic keratosis: Secondary | ICD-10-CM | POA: Diagnosis not present

## 2018-09-18 DIAGNOSIS — Z85828 Personal history of other malignant neoplasm of skin: Secondary | ICD-10-CM | POA: Diagnosis not present

## 2018-09-18 DIAGNOSIS — L218 Other seborrheic dermatitis: Secondary | ICD-10-CM | POA: Diagnosis not present

## 2018-09-19 DIAGNOSIS — M62838 Other muscle spasm: Secondary | ICD-10-CM | POA: Diagnosis not present

## 2018-09-19 DIAGNOSIS — R300 Dysuria: Secondary | ICD-10-CM | POA: Diagnosis not present

## 2018-09-19 DIAGNOSIS — M6281 Muscle weakness (generalized): Secondary | ICD-10-CM | POA: Diagnosis not present

## 2018-09-19 DIAGNOSIS — N3946 Mixed incontinence: Secondary | ICD-10-CM | POA: Diagnosis not present

## 2018-10-11 ENCOUNTER — Encounter: Payer: Self-pay | Admitting: Physical Therapy

## 2018-10-11 ENCOUNTER — Other Ambulatory Visit: Payer: Self-pay

## 2018-10-11 ENCOUNTER — Ambulatory Visit: Payer: Medicare Other | Attending: Internal Medicine | Admitting: Physical Therapy

## 2018-10-11 DIAGNOSIS — M6281 Muscle weakness (generalized): Secondary | ICD-10-CM | POA: Diagnosis not present

## 2018-10-11 DIAGNOSIS — R300 Dysuria: Secondary | ICD-10-CM | POA: Diagnosis not present

## 2018-10-11 DIAGNOSIS — I972 Postmastectomy lymphedema syndrome: Secondary | ICD-10-CM | POA: Diagnosis not present

## 2018-10-11 DIAGNOSIS — M62838 Other muscle spasm: Secondary | ICD-10-CM | POA: Diagnosis not present

## 2018-10-11 DIAGNOSIS — N3946 Mixed incontinence: Secondary | ICD-10-CM | POA: Diagnosis not present

## 2018-10-11 NOTE — Therapy (Signed)
[place], [place], [address] Phone: [phone]   Fax:  [phone]  Physical Therapy Evaluation  Patient Details  Name: Michelle Sims MRN: [id number] Date of Birth: 09/10/1929 Referring Provider (PT): Dr. Josetta Huddle   Encounter Date: 10/11/2018  PT End of Session - 10/11/18 1210    Visit Number  1    Number of Visits  1    PT Start Time  [id number]    PT Stop Time  1150    PT Time Calculation (min)  52 min    Activity Tolerance  Patient tolerated treatment well    Behavior During Therapy  [facility] for tasks assessed/performed       Past Medical History:  Diagnosis Date  . Anxiety   . Arthritis    knee & back   . Blood type, Rh negative   . Breast cancer ([place])   . Chronic fatigue   . Depression   . Diverticulosis   . Endocervical polyp   . GERD (gastroesophageal reflux disease)   . Hypertension   . Hypothyroidism   . IBS (irritable bowel syndrome)   . Insomnia   . Lymphedema   . Neuromuscular disorder ([place])    Parkinson's disease   . Osteopenia 06/2015   T score -2.4 distal third of the radius  . Parkinson disease ([place])   . RBBB 10/07/2013  . Thyroid cancer [facility])     Past Surgical History:  Procedure Laterality Date  . APPENDECTOMY  1979  . BREAST SURGERY  1979   MASTECTOMY AND RECONSTRUCTION  . DILATION AND CURETTAGE OF UTERUS     MISCARRIAGE  . EYE SURGERY Bilateral    w/ IOL  . HYSTEROSCOPY  [id number]   AND D&C  . MASTECTOMY Bilateral   . THYROIDECTOMY    . TONSILLECTOMY    . TOTAL KNEE ARTHROPLASTY Right 10/07/2013   Procedure: RIGHT TOTAL KNEE ARTHROPLASTY;  Surgeon: Kerin Salen, MD;  Location: [place];  Service: Orthopedics;  Laterality: Right;  . TUBAL LIGATION      There were no vitals filed for this visit.   Subjective Assessment - 10/11/18 1110    Subjective  My arm swelling is bothering me and it's tight when I wake up in the morning. I might need new garments.     Patient is accompained by:  Family member    Pertinent History  Vascular Parkinson's; hx multiple cellulitis infections in her right arm; Bilateral mastectomy 1979 with right ALND; anxiety, arthritis, depression, diverticulosis, GERD, HTN, IBS, osteopenia, R TKR; per Dr. Doristine Devoid notes-dementia    Patient Stated Goals  Get my arm reduced    Currently in Pain?  No/denies         [facility] PT Assessment - 10/11/18 0001      Assessment   Medical Diagnosis  Right arm lymphedema    Referring Provider (PT)  Dr. Josetta Huddle    Onset Date/Surgical Date  01/24/77    Hand Dominance  Right    Prior Therapy  yes      Precautions   Precautions  Other (comment)    Precaution Comments  dementia      Restrictions   Weight Bearing Restrictions  No      Balance Screen   Has the patient fallen in the past 6 months  No    Has the patient had a decrease in activity level because of a fear of falling?   No  Is the patient reluctant to leave their home because of a fear of falling?   No      Home Environment   Living Environment  Private residence    Living Arrangements  Spouse/significant other    Available Help at Discharge  Family      Prior Function   Level of [place] device for independence    Vocation  Retired    Leisure  Does water aerobics 1-2x/week; walks few times a week      Cognition   Overall Cognitive Status  History of cognitive impairments - at baseline      Posture/Postural Control   Posture/Postural Control  Postural limitations    Postural Limitations  Rounded Shoulders;Forward head        LYMPHEDEMA/ONCOLOGY QUESTIONNAIRE - 10/11/18 1117      Type   Cancer Type  Right breast      Surgeries   Mastectomy Date  01/24/77    Axillary Lymph Node Dissection Date  01/24/77    Number Lymph Nodes Removed  --   "all"     Treatment   Active Chemotherapy Treatment  No    Past Chemotherapy Treatment  No    Active Radiation Treatment  No    Past  Radiation Treatment  No    Current Hormone Treatment  No    Past Hormone Therapy  Yes    Drug Name  Tamoxifen      What other symptoms do you have   Are you Having Heaviness or Tightness  Yes    Are you having Pain  No    Are you having pitting edema  Yes    Body Site  right forearm    Is it Hard or Difficult finding clothes that fit  No    Do you have infections  Yes    Comments  Multiple over many years    Is there Decreased scar mobility  No    [place] Sign  No      Lymphedema Assessments   Lymphedema Assessments  Upper extremities      Right Upper Extremity Lymphedema   10 cm Proximal to Olecranon Process  26.8 cm    Olecranon Process  23 cm    15 cm Proximal to Ulnar Styloid Process  24.1 cm    10 cm Proximal to Ulnar Styloid Process  21.9 cm    Just Proximal to Ulnar Styloid Process  14.1 cm    Across Hand at [employer]  16.5 cm    At [place] of 2nd Digit  5.6 cm      Left Upper Extremity Lymphedema   10 cm Proximal to Olecranon Process  25.1 cm    Olecranon Process  21.8 cm    15 cm Proximal to Ulnar Styloid Process  20.5 cm    10 cm Proximal to Ulnar Styloid Process  17.4 cm    Just Proximal to Ulnar Styloid Process  13.7 cm    Across Hand at [employer]  16.7 cm    At [place] of 2nd Digit  5.9 cm          Quick Dash - 10/11/18 0001    Open a tight or new jar  Moderate difficulty    Do heavy household chores (wash walls, wash floors)  Unable    Carry a shopping bag or briefcase  Moderate difficulty    Wash your back  Severe difficulty    Use  a knife to cut food  Mild difficulty    Recreational activities in which you take some force or impact through your arm, shoulder, or hand (golf, hammering, tennis)  Moderate difficulty    During the past week, to what extent has your arm, shoulder or hand problem interfered with your normal social activities with family, friends, neighbors, or groups?  Modererately    During the past week, to what extent has your arm,  shoulder or hand problem limited your work or other regular daily activities  Modererately    Arm, shoulder, or hand pain.  Mild    Tingling (pins and needles) in your arm, shoulder, or hand  None    Difficulty Sleeping  No difficulty    DASH Score  43.18 %        Outpatient Rehab from [facility]/02/[facility]  Lymphedema Life Impact Scale Total Score  13.24 %      Objective measurements completed on examination: See above findings.              PT Education - 10/11/18 1209    Education Details  Educated pt and her husband on where and how to obtain new garments    Person(s) Educated  Patient;Spouse    Methods  Explanation;Handout    Comprehension  Verbalized understanding;Returned demonstration          PT Long Term Goals - 10/11/18 1217      PT LONG TERM GOAL #1   Title  Patient and husband will verbalize good understanding of where and how to be fitted for new compression garments.    Time  1    Period  Days    Status  Achieved             Plan - 10/11/18 1210    Clinical Impression Statement  Patient has long-term chronic right arm lymphedema from a mastectomy and ALND in 1979. It is well managed with daily wear of compression garments. They requested to come today to have a "check up" because they feel garments are worn out and wondered if I had other recommendations as she finds it very difficult to don her daytime garment. She measured about 1 cm larger in her right forearm compaed to 1 year ago but otherwise, no significant change. We discussed the possiblity of resuming treatment to include compression bandaging and manual lymph drainage but collectively agreed that the risk with Covid in coming to appointments and the minimal change in edema did not warrant resuming PT. She and her husband were educated on where to get measured for new garments. We agreed to reduce the compssion for the daytime sleeve to 20-30 mmHg for  easier donning. There is no other PT needed at this time.    Personal Factors and Comorbidities  Age;Comorbidity 1;Comorbidity 2    Comorbidities  Dementia; Parkinsons    Examination-Activity Limitations  Locomotion Level;Reach Overhead;Stairs    Examination-Participation Restrictions  Medication Management;Personal Finances;Meal Prep;Driving;Cleaning    Stability/Clinical Decision Making  Evolving/Moderate complexity    Clinical Decision Making  Moderate    Rehab Potential  Good    PT Frequency  One time visit    PT Treatment/Interventions  ADLs/Self Care Home Management;DME Instruction    PT Next Visit Plan  D/C    Consulted and Agree with Plan of Care  Family member/caregiver;Patient    Family Member Consulted  Husband       Patient will benefit from skilled therapeutic intervention  in order to improve the following deficits and impairments:  Decreased knowledge of use of DME  Visit Diagnosis: Postmastectomy lymphedema - Plan: PT plan of care cert/re-cert     Problem List Patient Active Problem List   Diagnosis Date Noted  . Parkinson's disease (tremor, stiffness, slow motion, unstable posture) ([place]) 11/13/2014  . Piriformis syndrome of left side 05/08/2014  . DDD (degenerative disc disease), lumbosacral 03/25/2014  . Abnormality of gait 03/25/2014  . Hypothyroidism 10/30/2013  . RBBB 10/07/2013  . Left shoulder pain 12/15/2011  . Postmenopause atrophic vaginitis 11/16/2011  . At high risk for falls 04/20/2011  . Blood type, Rh negative   . Thyroid cancer ([place])   . Endocervical polyp   . Osteoarthrosis, unspecified whether generalized or localized, involving lower leg 05/12/2009  . DEGENERATIVE DISC DISEASE, CERVICAL SPINE, W/RADICULOPATHY 04/30/2009  . BACK PAIN, THORACIC REGION, CHRONIC 04/30/2009  . DEPRESSION 11/14/2007  . Essential hypertension 11/14/2007  . CELLULITIS 11/14/2007  . BREAST CANCER, HX OF 11/14/2007  . THYROID CANCER, HX OF 11/14/2007   Annia Friendly, PT 10/11/18 12:24 PM  [place], [place], [address] Phone: [phone]   Fax:  [phone]  Name: Michelle Sims MRN: [id number] Date of Birth: 01/09/1930

## 2018-10-15 DIAGNOSIS — Z6823 Body mass index (BMI) 23.0-23.9, adult: Secondary | ICD-10-CM | POA: Diagnosis not present

## 2018-10-15 DIAGNOSIS — G214 Vascular parkinsonism: Secondary | ICD-10-CM | POA: Diagnosis not present

## 2018-10-18 DIAGNOSIS — F4323 Adjustment disorder with mixed anxiety and depressed mood: Secondary | ICD-10-CM | POA: Diagnosis not present

## 2018-10-30 DIAGNOSIS — K136 Irritative hyperplasia of oral mucosa: Secondary | ICD-10-CM | POA: Diagnosis not present

## 2018-11-01 ENCOUNTER — Encounter: Payer: Self-pay | Admitting: Gynecology

## 2018-11-23 ENCOUNTER — Other Ambulatory Visit: Payer: Self-pay | Admitting: Neurology

## 2018-11-23 DIAGNOSIS — G20 Parkinson's disease: Secondary | ICD-10-CM

## 2018-11-26 ENCOUNTER — Other Ambulatory Visit: Payer: Self-pay | Admitting: Neurology

## 2018-11-26 DIAGNOSIS — E039 Hypothyroidism, unspecified: Secondary | ICD-10-CM | POA: Diagnosis not present

## 2018-11-26 DIAGNOSIS — R5382 Chronic fatigue, unspecified: Secondary | ICD-10-CM | POA: Diagnosis not present

## 2018-11-26 DIAGNOSIS — Z79899 Other long term (current) drug therapy: Secondary | ICD-10-CM | POA: Diagnosis not present

## 2018-11-26 DIAGNOSIS — G214 Vascular parkinsonism: Secondary | ICD-10-CM | POA: Diagnosis not present

## 2018-11-26 DIAGNOSIS — G4700 Insomnia, unspecified: Secondary | ICD-10-CM | POA: Diagnosis not present

## 2018-11-26 DIAGNOSIS — F339 Major depressive disorder, recurrent, unspecified: Secondary | ICD-10-CM | POA: Diagnosis not present

## 2018-11-26 DIAGNOSIS — G20 Parkinson's disease: Secondary | ICD-10-CM

## 2018-11-27 ENCOUNTER — Other Ambulatory Visit: Payer: Self-pay

## 2018-11-27 MED ORDER — RALOXIFENE HCL 60 MG PO TABS: ORAL_TABLET | ORAL | 0 refills | Status: DC

## 2018-11-28 DIAGNOSIS — F4323 Adjustment disorder with mixed anxiety and depressed mood: Secondary | ICD-10-CM | POA: Diagnosis not present

## 2018-12-03 DIAGNOSIS — Z961 Presence of intraocular lens: Secondary | ICD-10-CM | POA: Diagnosis not present

## 2018-12-03 DIAGNOSIS — H52203 Unspecified astigmatism, bilateral: Secondary | ICD-10-CM | POA: Diagnosis not present

## 2018-12-11 ENCOUNTER — Ambulatory Visit: Payer: BC Managed Care – PPO | Admitting: Sports Medicine

## 2018-12-11 ENCOUNTER — Encounter

## 2018-12-27 ENCOUNTER — Ambulatory Visit: Payer: BC Managed Care – PPO | Admitting: Sports Medicine

## 2019-02-22 ENCOUNTER — Other Ambulatory Visit: Payer: Self-pay

## 2019-02-22 MED ORDER — RALOXIFENE HCL 60 MG PO TABS: ORAL_TABLET | ORAL | 0 refills | Status: DC

## 2019-02-22 NOTE — Telephone Encounter (Signed)
CE scheduled 04/15/19 with [place].

## 2019-02-26 DIAGNOSIS — Z6822 Body mass index (BMI) 22.0-22.9, adult: Secondary | ICD-10-CM | POA: Diagnosis not present

## 2019-02-26 DIAGNOSIS — G214 Vascular parkinsonism: Secondary | ICD-10-CM | POA: Diagnosis not present

## 2019-02-26 DIAGNOSIS — G319 Degenerative disease of nervous system, unspecified: Secondary | ICD-10-CM | POA: Diagnosis not present

## 2019-02-26 DIAGNOSIS — F419 Anxiety disorder, unspecified: Secondary | ICD-10-CM | POA: Diagnosis not present

## 2019-03-13 DIAGNOSIS — F0280 Dementia in other diseases classified elsewhere without behavioral disturbance: Secondary | ICD-10-CM | POA: Diagnosis not present

## 2019-03-13 DIAGNOSIS — G214 Vascular parkinsonism: Secondary | ICD-10-CM | POA: Diagnosis not present

## 2019-03-13 DIAGNOSIS — R41841 Cognitive communication deficit: Secondary | ICD-10-CM | POA: Diagnosis not present

## 2019-04-04 ENCOUNTER — Other Ambulatory Visit: Payer: Self-pay

## 2019-04-04 ENCOUNTER — Ambulatory Visit (INDEPENDENT_AMBULATORY_CARE_PROVIDER_SITE_OTHER): Payer: BC Managed Care – PPO | Admitting: Sports Medicine

## 2019-04-04 ENCOUNTER — Encounter: Payer: Self-pay | Admitting: Sports Medicine

## 2019-04-04 VITALS — BP 162/74 | Ht 63.0 in | Wt 130.0 lb

## 2019-04-04 DIAGNOSIS — R5383 Other fatigue: Secondary | ICD-10-CM | POA: Diagnosis not present

## 2019-04-04 DIAGNOSIS — G20 Parkinson's disease: Secondary | ICD-10-CM | POA: Diagnosis not present

## 2019-04-04 NOTE — Progress Notes (Signed)
PCP: Josetta Huddle, MD  Subjective:   HPI: Patient is a 86 y.o. female here for extreme fatigue.  Patient has history of possible Parkinson's disease.  Patient has been seeing a movement disorder specialist who thinks this may be more of a vascular problem than a true Parkinson's disease.  Patient is still taking Sinemet for her symptoms.  She does not get any tremor and overall has a relatively normal gait.  Patient has been having fatigue that is extreme enough that it causes her to sleep throughout the day.  She will frequently take 5 naps during the day and be out most of the night.  She has tried multiple medications to correct this including melatonin, mirtazapine, Remeron.  None of these seem to be improving her symptoms.  Patient endorses mild depression but this is chronic and does not seem to be related to her extreme fatigue.  Patient comes in looking for second opinion on what to do to help her fatigue.  Note : we used cymbalta in past for some chronic MSK pain   Review of Systems: See HPI above.  Past Medical History:  Diagnosis Date  . Anxiety   . Arthritis    knee & back   . Blood type, Rh negative   . Breast cancer ([place])   . Chronic fatigue   . Depression   . Diverticulosis   . Endocervical polyp   . GERD (gastroesophageal reflux disease)   . Hypertension   . Hypothyroidism   . IBS (irritable bowel syndrome)   . Insomnia   . Lymphedema   . Neuromuscular disorder ([place])    Parkinson's disease   . Osteopenia 06/2015   T score -2.4 distal third of the radius  . Parkinson disease ([place])   . RBBB 10/07/2013  . Thyroid cancer [facility])     Current Outpatient Medications on File Prior to Visit  Medication Sig Dispense Refill  . amLODipine (NORVASC) 2.5 MG tablet Take 2.5 mg by mouth daily.    . Ascorbic Acid (VITAMIN C PO) Take by mouth.    Marland Kitchen aspirin EC 81 MG tablet Take 81 mg by mouth daily.    . Calcium Carb-Cholecalciferol (CALCIUM 600 + D PO) Take 1 tablet by mouth  daily with lunch.    . carbidopa-levodopa (SINEMET IR) 25-100 MG tablet TAKE 1 & 1/2 TAB IN THE AM, 1 TAB MID AM, 1 & 1/2 TAB IN THE AFTERNOON, 1 TAB IN THE EVENING (Patient not taking: Reported on 10/11/2018) 450 tablet 5  . Carbidopa-Levodopa ER (SINEMET CR) 25-100 MG tablet controlled release TAKE 1 TABLET BY MOUTH EVERYDAY AT BEDTIME 90 tablet 1  . cephALEXin (KEFLEX) 250 MG capsule Take 1 capsule (250 mg total) by mouth 3 (three) times daily. 21 capsule 0  . cholecalciferol (VITAMIN D) 1000 units tablet Take 2,000 Units by mouth daily.    Marland Kitchen co-enzyme Q-10 30 MG capsule Take 30 mg by mouth 3 (three) times daily.    . Cranberry (SM CRANBERRY) 300 MG tablet Take 300 mg by mouth daily.    . DULoxetine (CYMBALTA) 30 MG capsule Take 1 capsule (30 mg total) by mouth 2 (two) times daily. 180 capsule 0  . fosfomycin (MONUROL) 3 g PACK Mix in 4 ounces of water and Administer immediately 3 g 0  . irbesartan (AVAPRO) 75 MG tablet Take 37.5 mg by mouth at bedtime.     Marland Kitchen levothyroxine (SYNTHROID, LEVOTHROID) 112 MCG tablet Take 112 mcg by mouth daily before breakfast.    .  MYRBETRIQ 25 MG TB24 tablet Take 25 mg by mouth daily.  1  . NONFORMULARY OR COMPOUNDED ITEM Estradiol vag cream 0.02% (0.2mg /ml)  S:  Insert 1 ml (1 syringe) into the vagina hs 2 times weekly as directed. 24 each 4  . raloxifene (EVISTA) 60 MG tablet TAKE 1 TABLET (60 MG TOTAL) BY MOUTH DAILY. 90 tablet 0  . rivastigmine (EXELON) 9.5 mg/24hr PLACE 1 PATCH (9.5 MG TOTAL) ONTO THE SKIN DAILY. 90 patch 1   No current facility-administered medications on file prior to visit.    Past Surgical History:  Procedure Laterality Date  . APPENDECTOMY  1979  . BREAST SURGERY  1979   MASTECTOMY AND RECONSTRUCTION  . DILATION AND CURETTAGE OF UTERUS     MISCARRIAGE  . EYE SURGERY Bilateral    w/ IOL  . HYSTEROSCOPY  [id number]   AND D&C  . MASTECTOMY Bilateral   . THYROIDECTOMY    . TONSILLECTOMY    . TOTAL KNEE ARTHROPLASTY Right  10/07/2013   Procedure: RIGHT TOTAL KNEE ARTHROPLASTY;  Surgeon: Kerin Salen, MD;  Location: [place];  Service: Orthopedics;  Laterality: Right;  . TUBAL LIGATION      Allergies  Allergen Reactions  . Sulfa Antibiotics Rash  . Biaxin [Clarithromycin] Other (See Comments)    Pt does not remember reaction   . Ciprofloxacin Nausea And Vomiting  . Hctz [Hydrochlorothiazide] Other (See Comments)    weakness and hyponatremia  . Macrobid [Nitrofurantoin] Nausea Only  . Other Nausea Only  . Prednisone Other (See Comments)    Swelling and facial redness   . Sulfamethoxazole-Trimethoprim Other (See Comments) and Rash    Sore throat    Social History   Socioeconomic History  . Marital status: Married    Spouse name: Not on file  . Number of children: Not on file  . Years of education: Not on file  . Highest education level: Not on file  Occupational History  . Occupation: retired    Comment: "odd jobs"  Tobacco Use  . Smoking status: Never Smoker  . Smokeless tobacco: Never Used  Substance and Sexual Activity  . Alcohol use: Yes    Alcohol/week: 0.0 standard drinks    Comment: rare  . Drug use: No  . Sexual activity: Never    Birth control/protection: Surgical, Post-menopausal    Comment: 1st intercourse 86 yo-Fewer than 5 partners  Other Topics Concern  . Not on file  Social History Narrative  . Not on file   Social Determinants of Health   Financial Resource Strain:   . Difficulty of Paying Living Expenses:   Food Insecurity:   . Worried About Charity fundraiser in the Last Year:   . Arboriculturist in the Last Year:   Transportation Needs:   . Film/video editor (Medical):   Marland Kitchen Lack of Transportation (Non-Medical):   Physical Activity:   . Days of Exercise per Week:   . Minutes of Exercise per Session:   Stress:   . Feeling of Stress :   Social Connections:   . Frequency of Communication with Friends and Family:   . Frequency of Social Gatherings with  Friends and Family:   . Attends Religious Services:   . Active Member of Clubs or Organizations:   . Attends Archivist Meetings:   Marland Kitchen Marital Status:   Intimate Partner Violence:   . Fear of Current or Ex-Partner:   . Emotionally Abused:   Marland Kitchen Physically  Abused:   . Sexually Abused:     Family History  Problem Relation Age of Onset  . Hypertension Mother   . Hyperlipidemia Mother   . Heart failure Mother   . Hypertension Father   . Hyperlipidemia Father   . Heart failure Father   . Hyperlipidemia Brother   . Hypertension Brother   . Heart attack Brother   . Breast cancer Neg Hx         Objective:  Physical Exam: BP (!) 162/74   Ht 5\' 3"  (1.6 m)   Wt 130 lb (59 kg)   BMI 23.03 kg/m  Gen: NAD, comfortable in exam room Lungs: Breathing comfortably on room air -No resting tremor was noted -No cogwheel rigidity was noted in the arms -Patient had a relatively normal gait.  There is mild shuffling however she seemed relatively stable without a walker   Assessment & Plan:  Patient is a 86 y.o. female here for extreme fatigue  1.  Extreme fatigue -Patient on multiple different medications for her Parkinson's.  Is unclear whether these are contributing to her symptoms.  We advised her to discuss this with her movement disorder specialist to see if the medications can either be decrease in dosage or discontinue entirely.  This includes her Sinemet and her Exelon. -We will be tapering her off of her duloxetine to see if this helps improve her symptoms.  She was advised to take the medication for 2 days and then 1 day off.  Continue this for 2 weeks and then after that take every other day for 2 weeks  Patient will follow back up as needed  I observed and examined the patient with Dr. Sheppard Coil and agree with assessment and plan.  Note reviewed and modified by me. Ila Mcgill, MD

## 2019-04-04 NOTE — Assessment & Plan Note (Signed)
Wondering if her fatigue is not more related to medication issues than to her Parkinson's  She is evaluated at [facility] ? If this is more vascular CNS issues than true parkinson's

## 2019-04-04 NOTE — Patient Instructions (Signed)
Your fatigue may be caused by a side effect from your medications -We would like to reduce the dose of your Cymbalta.  For the next 2 weeks take a pill 2 days in a row and then take 1 day off (2 days on and 1 day off).  After 2 weeks, decrease this to taking 1 pill every other day (1 day on and 1 day off) -Talk to your movement disorder specialist to see if there are any medications that they are prescribing that they can either be discontinued or decrease in the dosage.  We will see back as needed

## 2019-04-08 DIAGNOSIS — R413 Other amnesia: Secondary | ICD-10-CM | POA: Diagnosis not present

## 2019-04-08 DIAGNOSIS — E039 Hypothyroidism, unspecified: Secondary | ICD-10-CM | POA: Diagnosis not present

## 2019-04-08 DIAGNOSIS — R5382 Chronic fatigue, unspecified: Secondary | ICD-10-CM | POA: Diagnosis not present

## 2019-04-08 DIAGNOSIS — E559 Vitamin D deficiency, unspecified: Secondary | ICD-10-CM | POA: Diagnosis not present

## 2019-04-12 ENCOUNTER — Other Ambulatory Visit: Payer: Self-pay

## 2019-04-15 ENCOUNTER — Encounter: Payer: Self-pay | Admitting: Women's Health

## 2019-04-15 ENCOUNTER — Ambulatory Visit (INDEPENDENT_AMBULATORY_CARE_PROVIDER_SITE_OTHER): Payer: BC Managed Care – PPO | Admitting: Women's Health

## 2019-04-15 ENCOUNTER — Other Ambulatory Visit: Payer: Self-pay

## 2019-04-15 VITALS — BP 126/74 | Ht 63.0 in | Wt 134.0 lb

## 2019-04-15 DIAGNOSIS — Z01419 Encounter for gynecological examination (general) (routine) without abnormal findings: Secondary | ICD-10-CM

## 2019-04-15 DIAGNOSIS — Z853 Personal history of malignant neoplasm of breast: Secondary | ICD-10-CM | POA: Diagnosis not present

## 2019-04-15 MED ORDER — NYSTATIN-TRIAMCINOLONE 100000-0.1 UNIT/GM-% EX OINT: 1.0000 "application " | TOPICAL_OINTMENT | Freq: Two times a day (BID) | CUTANEOUS | 0 refills | Status: DC

## 2019-04-15 NOTE — Patient Instructions (Addendum)
Vitamin D 2000 IUs daily Dr Emogene Morgan [place] [facility]  Health Maintenance After Age 84 After age 10, you are at a higher risk for certain long-term diseases and infections as well as injuries from falls. Falls are a major cause of broken bones and head injuries in people who are older than age 22. Getting regular preventive care can help to keep you healthy and well. Preventive care includes getting regular testing and making lifestyle changes as recommended by your health care provider. Talk with your health care provider about:  Which screenings and tests you should have. A screening is a test that checks for a disease when you have no symptoms.  A diet and exercise plan that is right for you. What should I know about screenings and tests to prevent falls? Screening and testing are the best ways to find a health problem early. Early diagnosis and treatment give you the best chance of managing medical conditions that are common after age 74. Certain conditions and lifestyle choices may make you more likely to have a fall. Your health care provider may recommend:  Regular vision checks. Poor vision and conditions such as cataracts can make you more likely to have a fall. If you wear glasses, make sure to get your prescription updated if your vision changes.  Medicine review. Work with your health care provider to regularly review all of the medicines you are taking, including over-the-counter medicines. Ask your health care provider about any side effects that may make you more likely to have a fall. Tell your health care provider if any medicines that you take make you feel dizzy or sleepy.  Osteoporosis screening. Osteoporosis is a condition that causes the bones to get weaker. This can make the bones weak and cause them to break more easily.  Blood pressure screening. Blood pressure changes and medicines to control blood pressure can make you feel dizzy.  Strength and balance checks. Your  health care provider may recommend certain tests to check your strength and balance while standing, walking, or changing positions.  Foot health exam. Foot pain and numbness, as well as not wearing proper footwear, can make you more likely to have a fall.  Depression screening. You may be more likely to have a fall if you have a fear of falling, feel emotionally low, or feel unable to do activities that you used to do.  Alcohol use screening. Using too much alcohol can affect your balance and may make you more likely to have a fall. What actions can I take to lower my risk of falls? General instructions  Talk with your health care provider about your risks for falling. Tell your health care provider if: ? You fall. Be sure to tell your health care provider about all falls, even ones that seem minor. ? You feel dizzy, sleepy, or off-balance.  Take over-the-counter and prescription medicines only as told by your health care provider. These include any supplements.  Eat a healthy diet and maintain a healthy weight. A healthy diet includes low-fat dairy products, low-fat (lean) meats, and fiber from whole grains, beans, and lots of fruits and vegetables. Home safety  Remove any tripping hazards, such as rugs, cords, and clutter.  Install safety equipment such as grab bars in bathrooms and safety rails on stairs.  Keep rooms and walkways well-lit. Activity   Follow a regular exercise program to stay fit. This will help you maintain your balance. Ask your health care provider what types of exercise are  appropriate for you.  If you need a cane or walker, use it as recommended by your health care provider.  Wear supportive shoes that have nonskid soles. Lifestyle  Do not drink alcohol if your health care provider tells you not to drink.  If you drink alcohol, limit how much you have: ? 0-1 drink a day for women. ? 0-2 drinks a day for men.  Be aware of how much alcohol is in your  drink. In the [place] one drink equals one typical bottle of beer (12 oz), one-half glass of wine (5 oz), or one shot of hard liquor (1 oz).  Do not use any products that contain nicotine or tobacco, such as cigarettes and e-cigarettes. If you need help quitting, ask your health care provider. Summary  Having a healthy lifestyle and getting preventive care can help to protect your health and wellness after age 51.  Screening and testing are the best way to find a health problem early and help you avoid having a fall. Early diagnosis and treatment give you the best chance for managing medical conditions that are more common for people who are older than age 27.  Falls are a major cause of broken bones and head injuries in people who are older than age 14. Take precautions to prevent a fall at home.  Work with your health care provider to learn what changes you can make to improve your health and wellness and to prevent falls. This information is not intended to replace advice given to you by your health care provider. Make sure you discuss any questions you have with your health care provider. Document Revised: 05/03/2018 Document Reviewed: 11/23/2016 Elsevier Patient Education  2020 [employer].

## 2019-04-15 NOTE — Progress Notes (Signed)
Michelle Sims October 13, 1929 [id number]    History:    Presents for breast and pelvic exam.  Postmenopausal on no HRT with no bleeding.  Primary care manages hypertension, IBS and hypothyroidism.  Parkinson's biggest problem, reports memory, word finding difficulty, walks with a cane, lives at [place].  2010 - colonoscopy.  2017 T score -2.1 has been on Evista.  1979 breast cancer with reconstructive surgery.   Past medical history, past surgical history, family history and social history were all reviewed and documented in the EPIC chart.  3 sons all live away in [place], and [place]  ROS:  A ROS was performed and pertinent positives and negatives are included.  Exam:  Vitals:   04/15/19 1406  Weight: 134 lb (60.8 kg)  Height: 5\' 3"  (1.6 m)   Body mass index is 23.74 kg/m.   General appearance:  Normal Thyroid:  Symmetrical, normal in size, without palpable masses or nodularity. Respiratory  Auscultation:  Clear without wheezing or rhonchi Cardiovascular  Auscultation:  Regular rate, without rubs, murmurs or gallops  Edema/varicosities:  Not grossly evident Abdominal  Soft,nontender, without masses, guarding or rebound.  Liver/spleen:  No organomegaly noted  Hernia:  None appreciated  Skin  Inspection:  Grossly normal   Breasts: Examined lying and sitting.     Right: Mastectomy with reconstruction and implant      Left: Without masses, retractions, discharge or axillary adenopathy. Gentitourinary   Inguinal/mons:  Normal without inguinal adenopathy  External genitalia:  Normal no erythema or excoriation  BUS/Urethra/Skene's glands:  Normal  Vagina:  Normal  Cervix:  Normal  Uterus:   normal in size, shape and contour.  Midline and mobile  Adnexa/parametria:     Rt: Without masses or tenderness.   Lt: Without masses or tenderness.  Anus and perineum: Normal  Digital rectal exam: Normal sphincter tone without palpated masses or  tenderness  Assessment/Plan:  86 y.o. MWF G4, P3 for breast and pelvic exam with no GYN complaints.  Postmenopausal no HRT with no bleeding 1979 right breast cancer mastectomy with reconstruction Parkinson, memory issues, walks with a cane-neurologist manages Hypothyroid, anxiety/depression, hypertension-primary care manages labs and meds Right lower leg 2 cm dry skin patch causing pruritus-instructed to follow-up with dermatologist  Plan: Mycolog ointment, apply a small amount twice daily and follow-up with dermatologist.  Wears a incontinence underwear skin in good condition reports not saturated daily but does changes 1-2 times daily.  Encouraged water aerobics when able, continue walking daily, social engagements with distance, reviewed importance of home safety and fall prevention.  Pap screening guidelines reviewed no Pap.  Instructed to return if problems with discharge or bleeding.   [name], 2:09 PM 04/15/2019

## 2019-04-25 ENCOUNTER — Telehealth: Payer: Self-pay

## 2019-04-25 NOTE — Telephone Encounter (Signed)
Instructed Michelle Sims that his wife can continue using Cymbalta 2 days on, 1 day off as she has been, for the next 1-2 weeks to see if her symptoms resolve. He will touch base with [place] at that time. If she is doing better, she will try to taper down to taking it 1 day on, 1 day off. He understands and agrees with the plan.

## 2019-05-01 ENCOUNTER — Encounter: Payer: Self-pay | Admitting: Adult Health

## 2019-05-01 ENCOUNTER — Ambulatory Visit (INDEPENDENT_AMBULATORY_CARE_PROVIDER_SITE_OTHER): Payer: BC Managed Care – PPO | Admitting: Adult Health

## 2019-05-01 ENCOUNTER — Other Ambulatory Visit: Payer: Self-pay

## 2019-05-01 VITALS — BP 133/84 | HR 80 | Temp 97.7°F | Ht 62.0 in | Wt 130.0 lb

## 2019-05-01 DIAGNOSIS — G20 Parkinson's disease: Secondary | ICD-10-CM

## 2019-05-01 NOTE — Patient Instructions (Signed)
Your Plan:  Continue Sinemet and Sinemet CR Continue Exelon patch  Try Melatonin 1-3 mg 2 hours before bedtime If your symptoms worsen or you develop new symptoms please let us know.   Thank you for coming to see [place] at [facility]. I hope we have been able to provide you high quality care today.  You may receive a patient satisfaction survey over the next few weeks. We would appreciate your feedback and comments so that we may continue to improve ourselves and the health of our patients.

## 2019-05-01 NOTE — Progress Notes (Addendum)
PATIENT: Michelle Sims DOB: 11-16-1929  REASON FOR VISIT: follow up HISTORY FROM: patient  HISTORY OF PRESENT ILLNESS: Today 05/01/19:  Michelle Sims is an 86 year old female with a history of Parkinson's disease.  She returns today for follow-up.  Her husband reports that they have been seeing Dr. Mervyn Skeeters in [place].  Reports that she feels that the patient has vascular Parkinson's.  Patient reports that she does not have a tremor in the hands.  Occasionally she will have a tremor in the legs when she stands.  Denies any trouble swallowing food or liquids.  She uses a Rollator when ambulating.  She reports that she had a fall a few months ago.  Reports that she tripped over her rug.  Fortunately she did not suffer any significant injuries.  Reports that she continues to struggle to fall asleep and stay asleep.  Often feels tired and has to nap during the day.  PCP is weaning her off of Cymbalta to see if this helps with daytime sleepiness.  She remains on Sinemet taking 1 tablet 4 times a day and Sinemet CR at bedtime.  She also remains on the Exelon patch.   HISTORY 11/08/17:  Michelle Sims is an 86 year old female with a history of Parkinson's disease.  She is currently on Sinemet 1-1/2 tablet alternating with 1 tablet for 4 doses.  She takes Sinemet CR at bedtime.  The patient denies tremor.  She reports that she sometimes will have shuffling gait but she consciously tries to avoid this.  She denies any falls.  She does use a cane when ambulating.  Denies any trouble chewing or swallowing food.  She reports that she sometimes has trouble falling asleep.  She often wakes up 3-4 times to urinate.  Her husband states that she does not stay well-hydrated during the day.  She does participate in exercise classes at the facility.  She returns today for evaluation  REVIEW OF SYSTEMS: Out of a complete 14 system review of symptoms, the patient complains only of the following symptoms, and all  other reviewed systems are negative.  See HPI  ALLERGIES: Allergies  Allergen Reactions  . Sulfa Antibiotics Rash  . Biaxin [Clarithromycin] Other (See Comments)    Pt does not remember reaction   . Ciprofloxacin Nausea And Vomiting  . Hctz [Hydrochlorothiazide] Other (See Comments)    weakness and hyponatremia  . Macrobid [Nitrofurantoin] Nausea Only  . Other Nausea Only  . Prednisone Other (See Comments)    Swelling and facial redness   . Sulfamethoxazole-Trimethoprim Other (See Comments) and Rash    Sore throat    HOME MEDICATIONS: Outpatient Medications Prior to Visit  Medication Sig Dispense Refill  . amLODipine (NORVASC) 2.5 MG tablet Take 2.5 mg by mouth daily.    . Ascorbic Acid (VITAMIN C PO) Take by mouth.    Marland Kitchen aspirin EC 81 MG tablet Take 81 mg by mouth daily.    . Calcium Carb-Cholecalciferol (CALCIUM 600 + D PO) Take 1 tablet by mouth daily with lunch.    . carbidopa-levodopa (SINEMET IR) 25-100 MG tablet TAKE 1 & 1/2 TAB IN THE AM, 1 TAB MID AM, 1 & 1/2 TAB IN THE AFTERNOON, 1 TAB IN THE EVENING 450 tablet 5  . Carbidopa-Levodopa ER (SINEMET CR) 25-100 MG tablet controlled release TAKE 1 TABLET BY MOUTH EVERYDAY AT BEDTIME 90 tablet 1  . cholecalciferol (VITAMIN D) 1000 units tablet Take 2,000 Units by mouth daily.    Marland Kitchen  co-enzyme Q-10 30 MG capsule Take 30 mg by mouth 3 (three) times daily.    . Cranberry (SM CRANBERRY) 300 MG tablet Take 300 mg by mouth daily.    . DULoxetine (CYMBALTA) 30 MG capsule Take 1 capsule (30 mg total) by mouth 2 (two) times daily. 180 capsule 0  . fosfomycin (MONUROL) 3 g PACK Mix in 4 ounces of water and Administer immediately 3 g 0  . irbesartan (AVAPRO) 75 MG tablet Take 37.5 mg by mouth at bedtime.     Marland Kitchen levothyroxine (SYNTHROID, LEVOTHROID) 112 MCG tablet Take 112 mcg by mouth daily before breakfast.    . MYRBETRIQ 25 MG TB24 tablet Take 25 mg by mouth daily.  1  . NONFORMULARY OR COMPOUNDED ITEM Estradiol vag cream 0.02%  (0.2mg /ml)  S:  Insert 1 ml (1 syringe) into the vagina hs 2 times weekly as directed. 24 each 4  . nystatin-triamcinolone ointment (MYCOLOG) Apply 1 application topically 2 (two) times daily. 30 g 0  . raloxifene (EVISTA) 60 MG tablet TAKE 1 TABLET (60 MG TOTAL) BY MOUTH DAILY. 90 tablet 0  . rivastigmine (EXELON) 9.5 mg/24hr PLACE 1 PATCH (9.5 MG TOTAL) ONTO THE SKIN DAILY. 90 patch 1   No facility-administered medications prior to visit.    PAST MEDICAL HISTORY: Past Medical History:  Diagnosis Date  . Anxiety   . Arthritis    knee & back   . Blood type, Rh negative   . Breast cancer ([place])   . Chronic fatigue   . Depression   . Diverticulosis   . Endocervical polyp   . GERD (gastroesophageal reflux disease)   . Hypertension   . Hypothyroidism   . IBS (irritable bowel syndrome)   . Insomnia   . Lymphedema   . Neuromuscular disorder ([place])    Parkinson's disease   . Osteopenia 06/2015   T score -2.4 distal third of the radius  . Parkinson disease ([place])   . RBBB 10/07/2013  . Thyroid cancer ([place])     PAST SURGICAL HISTORY: Past Surgical History:  Procedure Laterality Date  . APPENDECTOMY  1979  . BREAST SURGERY  1979   MASTECTOMY AND RECONSTRUCTION  . DILATION AND CURETTAGE OF UTERUS     MISCARRIAGE  . EYE SURGERY Bilateral    w/ IOL  . HYSTEROSCOPY  [id number]   AND D&C  . MASTECTOMY Bilateral   . THYROIDECTOMY    . TONSILLECTOMY    . TOTAL KNEE ARTHROPLASTY Right 10/07/2013   Procedure: RIGHT TOTAL KNEE ARTHROPLASTY;  Surgeon: Kerin Salen, MD;  Location: [facility];  Service: Orthopedics;  Laterality: Right;  . TUBAL LIGATION      FAMILY HISTORY: Family History  Problem Relation Age of Onset  . Hypertension Mother   . Hyperlipidemia Mother   . Heart failure Mother   . Hypertension Father   . Hyperlipidemia Father   . Heart failure Father   . Hyperlipidemia Brother   . Hypertension Brother   . Heart attack Brother   . Breast cancer Neg Hx     SOCIAL  HISTORY: Social History   Socioeconomic History  . Marital status: Married    Spouse name: Not on file  . Number of children: Not on file  . Years of education: Not on file  . Highest education level: Not on file  Occupational History  . Occupation: retired    Comment: "odd jobs"  Tobacco Use  . Smoking status: Never Smoker  . Smokeless tobacco: Never  Used  Substance and Sexual Activity  . Alcohol use: Yes    Alcohol/week: 0.0 standard drinks    Comment: rare  . Drug use: No  . Sexual activity: Never    Birth control/protection: Surgical, Post-menopausal    Comment: 1st intercourse 86 yo-Fewer than 5 partners  Other Topics Concern  . Not on file  Social History Narrative  . Not on file   Social Determinants of Health   Financial Resource Strain:   . Difficulty of Paying Living Expenses:   Food Insecurity:   . Worried About Charity fundraiser in the Last Year:   . Arboriculturist in the Last Year:   Transportation Needs:   . Film/video editor (Medical):   Marland Kitchen Lack of Transportation (Non-Medical):   Physical Activity:   . Days of Exercise per Week:   . Minutes of Exercise per Session:   Stress:   . Feeling of Stress :   Social Connections:   . Frequency of Communication with Friends and Family:   . Frequency of Social Gatherings with Friends and Family:   . Attends Religious Services:   . Active Member of Clubs or Organizations:   . Attends Archivist Meetings:   Marland Kitchen Marital Status:   Intimate Partner Violence:   . Fear of Current or Ex-Partner:   . Emotionally Abused:   Marland Kitchen Physically Abused:   . Sexually Abused:       PHYSICAL EXAM  Vitals:   05/01/19 1102  BP: 133/84  Pulse: 80  Temp: 97.7 F (36.5 C)  Weight: 130 lb (59 kg)  Height: 5\' 2"  (1.575 m)   Body mass index is 23.78 kg/m.  Generalized: Well developed, in no acute distress   Neurological examination  Mentation: Alert oriented to time, place, history taking. Follows all  commands speech and language fluent Cranial nerve II-XII: Pupils were equal round reactive to light. Extraocular movements were full, visual field were full on confrontational test.  Head turning and shoulder shrug  were normal and symmetric. Motor: The motor testing reveals 5 over 5 strength of all 4 extremities.  Sensory: Sensory testing is intact to soft touch on all 4 extremities. No evidence of extinction is noted.  Coordination: Cerebellar testing reveals good finger-nose-finger and heel-to-shin bilaterally.  Gait and station: Patient uses a Rollator when ambulating.  Slightly decreased stride.  Good turns. Reflexes: Deep tendon reflexes are symmetric and normal bilaterally.   DIAGNOSTIC DATA (LABS, IMAGING, TESTING) - I reviewed patient records, labs, notes, testing and imaging myself where available.  Lab Results  Component Value Date   WBC 8.6 10/15/2013   HGB 10.4 (A) 10/15/2013   HCT 31 (A) 10/15/2013   MCV 92.5 10/10/2013   PLT 314 10/15/2013      Component Value Date/Time   NA 139 10/15/2013 0000   K 3.9 10/15/2013 0000   CL 101 10/08/2013 0440   CO2 24 10/08/2013 0440   GLUCOSE 151 (H) 10/08/2013 0440   BUN 18 10/15/2013 0000   CREATININE 0.7 10/15/2013 0000   CREATININE 0.57 10/08/2013 0440   CALCIUM 8.1 (L) 10/08/2013 0440   GFRNONAA 84 (L) 10/08/2013 0440   GFRAA >90 10/08/2013 0440   No results found for: CHOL, HDL, LDLCALC, LDLDIRECT, TRIG, CHOLHDL Lab Results  Component Value Date   HGBA1C 5.6 10/15/2013   No results found for: VITAMINB12 No results found for: TSH    ASSESSMENT AND PLAN 86 y.o. year old female  has a  past medical history of Anxiety, Arthritis, Blood type, Rh negative, Breast cancer ([place]), Chronic fatigue, Depression, Diverticulosis, Endocervical polyp, GERD (gastroesophageal reflux disease), Hypertension, Hypothyroidism, IBS (irritable bowel syndrome), Insomnia, Lymphedema, Neuromuscular disorder ([place]), Osteopenia (06/2015), Parkinson  disease ([place]), RBBB (10/07/2013), and Thyroid cancer ([place]). here with:  1.  Parkinson's disease  -Continue Sinemet IR and Sinemet CR -Continue Exelon patch -Consider melatonin 1 to 3 mg 2 hours before bedtime to help with sleep -Advised if her symptoms worsen or she develops new symptoms she should let us know -Follow-up in 6 months or sooner if needed   I spent 45 minutes of face-to-face and non-face-to-face time with patient.  This included previsit chart review, lab review, study review, order entry, electronic health record documentation, patient education.  Ward Givens, MSN, NP-C 05/01/2019, 11:12 AM [facility] [address] [place], [place] [address] [phone]  I reviewed the above note and documentation by the Nurse Practitioner and agree with the history, exam, assessment and plan as outlined above. I was available for consultation. Star Age, MD, PhD [facility])

## 2019-05-20 ENCOUNTER — Other Ambulatory Visit: Payer: Self-pay

## 2019-05-20 MED ORDER — NONFORMULARY OR COMPOUNDED ITEM: 4 refills | Status: DC

## 2019-05-20 NOTE — Telephone Encounter (Signed)
Rx phoned in.   

## 2019-05-20 NOTE — Telephone Encounter (Signed)
Okay for refill?  

## 2019-05-23 DIAGNOSIS — E559 Vitamin D deficiency, unspecified: Secondary | ICD-10-CM | POA: Diagnosis not present

## 2019-05-23 DIAGNOSIS — R5382 Chronic fatigue, unspecified: Secondary | ICD-10-CM | POA: Diagnosis not present

## 2019-05-23 DIAGNOSIS — E039 Hypothyroidism, unspecified: Secondary | ICD-10-CM | POA: Diagnosis not present

## 2019-05-23 DIAGNOSIS — R413 Other amnesia: Secondary | ICD-10-CM | POA: Diagnosis not present

## 2019-05-27 ENCOUNTER — Telehealth: Payer: Self-pay | Admitting: *Deleted

## 2019-05-27 DIAGNOSIS — G20 Parkinson's disease: Secondary | ICD-10-CM

## 2019-05-27 NOTE — Addendum Note (Signed)
Addended by: Trudie Buckler on: 05/27/2019 02:41 PM   Modules accepted: Orders

## 2019-05-27 NOTE — Telephone Encounter (Signed)
Pt husband called, need PT order in today for his wife to have more PT for balance and Gait. Please call [phone]

## 2019-05-27 NOTE — Telephone Encounter (Signed)
Last seen 05/01/2019

## 2019-05-27 NOTE — Telephone Encounter (Signed)
Pt husband called, need PT order in today for his wife  balance and gait. Please call [phone]

## 2019-05-31 ENCOUNTER — Encounter: Payer: Self-pay | Admitting: Physical Therapy

## 2019-05-31 ENCOUNTER — Ambulatory Visit: Payer: BC Managed Care – PPO | Attending: Adult Health | Admitting: Physical Therapy

## 2019-05-31 ENCOUNTER — Other Ambulatory Visit: Payer: Self-pay

## 2019-05-31 ENCOUNTER — Other Ambulatory Visit: Payer: Self-pay | Admitting: *Deleted

## 2019-05-31 DIAGNOSIS — Z9181 History of falling: Secondary | ICD-10-CM | POA: Diagnosis not present

## 2019-05-31 DIAGNOSIS — R2681 Unsteadiness on feet: Secondary | ICD-10-CM

## 2019-05-31 DIAGNOSIS — R293 Abnormal posture: Secondary | ICD-10-CM | POA: Diagnosis not present

## 2019-05-31 DIAGNOSIS — M6281 Muscle weakness (generalized): Secondary | ICD-10-CM

## 2019-05-31 DIAGNOSIS — R2689 Other abnormalities of gait and mobility: Secondary | ICD-10-CM | POA: Insufficient documentation

## 2019-05-31 MED ORDER — RALOXIFENE HCL 60 MG PO TABS: ORAL_TABLET | ORAL | 2 refills | Status: DC

## 2019-05-31 NOTE — Addendum Note (Signed)
Addended by: Arliss Journey on: 05/31/2019 02:40 PM   Modules accepted: Orders

## 2019-05-31 NOTE — Therapy (Addendum)
[facility] [address] [place], [place], [address] Phone: [phone]   Fax:  [phone]  Physical Therapy Evaluation  Patient Details  Name: Michelle Sims MRN: [id number] Date of Birth: [birth year] Referring Provider (PT): Ernestine Mcmurray, NP    Encounter Date: 05/31/2019  PT End of Session - 05/31/19 1202    Visit Number  1    Number of Visits  17    Date for PT Re-Evaluation  08/29/19   written for 60 day POC   Authorization Type  [employer]    PT Start Time  1104    PT Stop Time  1148    PT Time Calculation (min)  44 min    Equipment Utilized During Treatment  Gait belt    Activity Tolerance  Patient tolerated treatment well;Patient limited by fatigue    Behavior During Therapy  [facility] for tasks assessed/performed       Past Medical History:  Diagnosis Date  . Anxiety   . Arthritis    knee & back   . Blood type, Rh negative   . Breast cancer ([place])   . Chronic fatigue   . Depression   . Diverticulosis   . Endocervical polyp   . GERD (gastroesophageal reflux disease)   . Hypertension   . Hypothyroidism   . IBS (irritable bowel syndrome)   . Insomnia   . Lymphedema   . Neuromuscular disorder ([place])    Parkinson's disease   . Osteopenia 06/2015   T score -2.4 distal third of the radius  . Parkinson disease ([place])   . RBBB 10/07/2013  . Thyroid cancer [facility])     Past Surgical History:  Procedure Laterality Date  . APPENDECTOMY  1979  . BREAST SURGERY  1979   MASTECTOMY AND RECONSTRUCTION  . DILATION AND CURETTAGE OF UTERUS     MISCARRIAGE  . EYE SURGERY Bilateral    w/ IOL  . HYSTEROSCOPY  [id number]   AND D&C  . MASTECTOMY Bilateral   . THYROIDECTOMY    . TONSILLECTOMY    . TOTAL KNEE ARTHROPLASTY Right 10/07/2013   Procedure: RIGHT TOTAL KNEE ARTHROPLASTY;  Surgeon: Kerin Salen, MD;  Location: [facility];  Service: Orthopedics;  Laterality: Right;  . TUBAL LIGATION      There were no vitals filed  for this visit.   Subjective Assessment - 05/31/19 1108    Subjective  In the last couple of weeks, noticed she has been feeling weaker and has been having increased leg shaking. Has had increased fatigue for several months - can't make any appointments in the morning. Gets tired more easily now. Has seen multiple doctors for it. Uses rollator in the community. In the house either uses a cane or furniture walks. Is very afraid of falling. Tries to do some walking every day. Last fall her foot caught on the bathroom rug.    Patient is accompained by:  Family member   husband, [name]   Pertinent History  PMH: PD, anxiety, arthritis, breast cancer, HTN, thyroid cancer, osteopenia, depression, B mastectomy 1979, chronic fatigue    How long can you walk comfortably?  approx. 3/10 of a mile    Patient Stated Goals  wants to be able to move more easily    Currently in Pain?  No/denies         [facility] PT Assessment - 05/31/19 1117      Assessment   Medical Diagnosis  PD    Referring  Provider (PT)  Ernestine Mcmurray, NP     Onset Date/Surgical Date  05/27/19   date of referral   Hand Dominance  Right    Prior Therapy  PT at this prior location      Precautions   Precautions  Fall      Balance Screen   Has the patient fallen in the past 6 months  Yes    How many times?  1    Has the patient had a decrease in activity level because of a fear of falling?   Yes    Is the patient reluctant to leave their home because of a fear of falling?   Yes      [place] residence    Living Arrangements  Spouse/significant other    Available Help at Discharge  Family    Type of [place] Access  Level entry    [place] - single point;Shower seat;Grab bars - toilet;Grab bars - tub/shower;Other (comment)   rollator   Additional Comments  has increased fear of taking steps up stairs (stairs that she could use in the  building where she lives, but avoids them)      Prior Function   Level of Independence  Independent with community mobility with device    Leisure  [name] helps with cooking/cleaning. Likes reading        Cognition   Overall Cognitive Status  --   per pt report, decr memory      Observation/Other Assessments   Observations  L shoulder elevated more than R      Sensation   Light Touch  Appears Intact      Coordination   Gross Motor Movements are Fluid and Coordinated  Yes      ROM / Strength   AROM / PROM / Strength  Strength      Strength   Strength Assessment Site  Hip;Ankle;Knee    Right/Left Hip  Right;Left    Right Hip Flexion  4+/5    Left Hip Flexion  4/5    Right/Left Knee  Right;Left    Right Knee Flexion  4+/5    Right Knee Extension  5/5    Left Knee Flexion  4/5    Left Knee Extension  5/5    Right/Left Ankle  Right;Left    Right Ankle Dorsiflexion  4/5    Left Ankle Dorsiflexion  4/5      Transfers   Transfers  Sit to Stand;Stand to Sit    Sit to Stand  5: Supervision;Without upper extremity assist;From chair/3-in-1    Sit to Stand Details (indicate cue type and reason)  takes a couple attempts to perform 1st rep, incr hip ADD/IR when performing     Five time sit to stand comments   11.87 seconds with no UE support, pt with incr forward flexed posture in standing     Stand to Sit  Without upper extremity assist;5: Supervision;To chair/3-in-1    Stand to Sit Details  does not fully stand erect    Transfer Cueing  pt needing cues for rollator brake management and to fully turn with rollator before sitting down in chair     Comments  30 second chair stand: 10 sit <> stands, had to stop at 25 seconds due to fatigue      Ambulation/Gait   Ambulation/Gait  Yes  Ambulation/Gait Assistance  4: Min guard;5: Supervision    Ambulation/Gait Assistance Details  when performing TUG with no AD - pt demonstrating decr step length B with more shuffled steps      Ambulation Distance (Feet)  100 Feet   approx   Assistive device  Rollator;None    Gait Pattern  Step-through pattern;Step-to pattern;Decreased arm swing - right;Decreased arm swing - left;Decreased step length - left;Decreased step length - right;Decreased stride length;Decreased dorsiflexion - right;Decreased dorsiflexion - left;Right foot flat;Left foot flat;Narrow base of support;Trunk flexed;Shuffle    Ambulation Surface  Level;Indoor    Gait velocity  13.97 seconds = 2.34 ft/sec with rollator       Standardized Balance Assessment   Standardized Balance Assessment  Timed Up and Go Test      Timed Up and Go Test   Normal TUG (seconds)  16.72   with rollator   TUG Comments  normal TUG with no AD = 21 seconds                Outpatient Re[facility]  Lymphedema Life Impact Scale Total Score  13.24 %      Objective measurements completed on examination: See above findings.              PT Education - 05/31/19 1202    Education Details  clinical findings, POC, fall risk    Person(s) Educated  Patient;Spouse    Methods  Explanation;Demonstration    Comprehension  Verbalized understanding;Need further instruction;Returned demonstration       PT Short Term Goals - 05/31/19 1426      PT SHORT TERM GOAL #1   Title  Pt will perform initial HEP with husband's supervision for improved balance and gait. ALL STGS DUE 06/28/19    Time  4    Period  Weeks    Status  New    Target Date  06/28/19      PT SHORT TERM GOAL #2   Title  Pt/husband will verbalize understanding of fall prevention in home environment.    Time  4    Period  Weeks    Status  New      PT SHORT TERM GOAL #3   Title  Pt will demo improved functional transfers by demonstrating 5 sit <> stands with proper weight shift and erect posture in standing.    Baseline  currently does not stand fully erect with sit <> stands    Time  4    Period   Weeks    Status  New      PT SHORT TERM GOAL #4   Title  Pt will perform TUG with rollator to 15 seconds or less to decr fall risk, pt will undergo assessment of TUG with SPC.    Time  4    Period  Weeks    Status  New        PT Long Term Goals - 05/31/19 1427      PT LONG TERM GOAL #1   Title  Pt will be independent with final HEP with husband's supervision in order to build upon functional gains made in therapy. ALL LTGS DUE 07/26/19    Time  8    Period  Weeks    Status  New    Target Date  07/26/19      PT LONG TERM GOAL #2   Title  Pt will improve TUG score to less than or  equal to 18 seconds with SPC vs. no AD for improving mobility.    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    Baseline  2.34 ft/sec with rollator    Time  8    Period  Weeks      PT LONG TERM GOAL #4   Title  Pt will ambulate at least 500 ft over indoor/outdoor surfaces, with supervision and appropriate assistive device, for improved community gait.    Time  8    Period  Weeks    Status  New      PT LONG TERM GOAL #5   Title  Pt will perform at least 10 sit <> stands in 30 seconds with no UE support to demo improved functional LE strength and endurance.    Time  8    Period  Weeks    Status  New             Plan - 05/31/19 1432    Clinical Impression Statement  Patient is a 86 year old female referred to Neuro OPPT for evaluation with primary concern of PD and decreased confidence with gait/balance  . Pt also reporting increased fatigue and weakness.  Prior to [place], pt was participating in [employer] 2x per week. Currently pt is trying to perform a walking program with rollator, but is limited by fatigue. Pt's PMH is significant for: PD, anxiety, arthritis, breast cancer, HTN, thyroid cancer, osteopenia, depression, B mastectomy 1979,  chronic fatigue .  The following deficits were present during the exam: decreased balance, abnormal posture, decrease BLE strength, decr timing and coordination with gait, shuffled gait, decreased safety awareness with transfers and rollator, decreased endurance and activity tolerance. Pt's TUG  scores indicate pt is at a high risk for falls.  Pt with decreased endurance/ strength and was unable to fully complete the 30 second chair stand due to fatigue - however HR afterwards was 84 bpm and O2 sats were 100%. Pt would benefit from skilled PT to address these impairments and functional limitations to maximize functional mobility independence.    Personal Factors and Comorbidities  Age;Comorbidity 3+;Time since onset of injury/illness/exacerbation;Past/Current Experience    Comorbidities  PMH: PD, anxiety, arthritis, breast cancer, HTN, thyroid cancer, osteopenia, depression, B mastectomy 1979, chronic fatigue    Examination-Activity Limitations  Transfers;Stand;Stairs;Squat;Bathing    Examination-Participation Restrictions  Cleaning;Community Activity;Meal Prep    Stability/Clinical Decision Making  Evolving/Moderate complexity    Clinical Decision Making  Moderate    Rehab Potential  Good    PT Frequency  2x / week   2x week for 4 weeks, 1-2x week for 4 weeks   PT Duration  8 weeks    PT Treatment/Interventions  ADLs/Self Care Home Management;Gait training;Stair training;Functional mobility training;Neuromuscular re-education;Balance training;Therapeutic exercise;Therapeutic activities;Patient/family education;Energy conservation    PT Next Visit Plan  fall prevention strategies in the home, gait speed and TUG with SPC, initial HEP for functional LE strength and balance. monitor fatigue    Consulted and Agree with Plan of Care  Patient;Family member/caregiver    Family Member Consulted  husband, [name]       Patient will benefit from skilled therapeutic intervention in order to improve the  following deficits and impairments:  Abnormal gait, Decreased activity tolerance, Decreased  balance, Decreased endurance, Decreased safety awareness, Decreased knowledge of use of DME, Decreased strength, Difficulty walking, Postural dysfunction  Visit Diagnosis: Abnormal posture  Other abnormalities of gait and mobility  Unsteadiness on feet  Muscle weakness (generalized)  History of falling     Problem List Patient Active Problem List   Diagnosis Date Noted  . Parkinson's disease (tremor, stiffness, slow motion, unstable posture) ([place]) 11/13/2014  . Piriformis syndrome of left side 05/08/2014  . DDD (degenerative disc disease), lumbosacral 03/25/2014  . Abnormality of gait 03/25/2014  . Hypothyroidism 10/30/2013  . RBBB 10/07/2013  . Left shoulder pain 12/15/2011  . Postmenopause atrophic vaginitis 11/16/2011  . At high risk for falls 04/20/2011  . Blood type, Rh negative   . Thyroid cancer ([place])   . Endocervical polyp   . Osteoarthrosis, unspecified whether generalized or localized, involving lower leg 05/12/2009  . DEGENERATIVE DISC DISEASE, CERVICAL SPINE, W/RADICULOPATHY 04/30/2009  . BACK PAIN, THORACIC REGION, CHRONIC 04/30/2009  . DEPRESSION 11/14/2007  . Essential hypertension 11/14/2007  . CELLULITIS 11/14/2007  . BREAST CANCER, HX OF 11/14/2007  . THYROID CANCER, HX OF 11/14/2007    Arliss Journey, PT, DPT  05/31/2019, 2:38 PM  [facility] [address] [place], [place], [address] Phone: [phone]   Fax:  [phone]  Name: JKAYLA MCCALLON MRN: [id number] Date of Birth: November 06, 1929

## 2019-06-03 ENCOUNTER — Ambulatory Visit: Payer: BC Managed Care – PPO | Admitting: Adult Health

## 2019-06-06 ENCOUNTER — Ambulatory Visit: Payer: BC Managed Care – PPO | Admitting: Physical Therapy

## 2019-06-06 ENCOUNTER — Other Ambulatory Visit: Payer: Self-pay

## 2019-06-06 ENCOUNTER — Telehealth: Payer: Self-pay | Admitting: Adult Health

## 2019-06-06 DIAGNOSIS — Z9181 History of falling: Secondary | ICD-10-CM | POA: Diagnosis not present

## 2019-06-06 DIAGNOSIS — R293 Abnormal posture: Secondary | ICD-10-CM | POA: Diagnosis not present

## 2019-06-06 DIAGNOSIS — R2681 Unsteadiness on feet: Secondary | ICD-10-CM

## 2019-06-06 DIAGNOSIS — R2689 Other abnormalities of gait and mobility: Secondary | ICD-10-CM | POA: Diagnosis not present

## 2019-06-06 DIAGNOSIS — M6281 Muscle weakness (generalized): Secondary | ICD-10-CM

## 2019-06-06 NOTE — Telephone Encounter (Signed)
Patient's husband came from PT and they wanted [place] to be aware patient's condition has declined since her last visit with Jinny Blossom and wanted the provider to be aware. Has a 6 mon follow up but should get a sooner apt. Best call back is [phone]

## 2019-06-06 NOTE — Therapy (Signed)
[facility] [address] [place], [place], [address] Phone: [phone]   Fax:  [phone]  Physical Therapy Treatment  Patient Details  Name: Michelle Sims MRN: [id number] Date of Birth: [birth year] Referring Provider (PT): Ernestine Mcmurray, NP    Encounter Date: 06/06/2019  PT End of Session - 06/06/19 1228    Visit Number  2    Number of Visits  17    Date for PT Re-Evaluation  08/29/19   written for 60 day POC   Authorization Type  BCBS    PT Start Time  1226    PT Stop Time  1312    PT Time Calculation (min)  46 min    Equipment Utilized During Treatment  Gait belt    Activity Tolerance  Patient tolerated treatment well;Patient limited by fatigue    Behavior During Therapy  [facility] for tasks assessed/performed       Past Medical History:  Diagnosis Date  . Anxiety   . Arthritis    knee & back   . Blood type, Rh negative   . Breast cancer ([place])   . Chronic fatigue   . Depression   . Diverticulosis   . Endocervical polyp   . GERD (gastroesophageal reflux disease)   . Hypertension   . Hypothyroidism   . IBS (irritable bowel syndrome)   . Insomnia   . Lymphedema   . Neuromuscular disorder ([place])    Parkinson's disease   . Osteopenia 06/2015   T score -2.4 distal third of the radius  . Parkinson disease ([place])   . RBBB 10/07/2013  . Thyroid cancer [facility])     Past Surgical History:  Procedure Laterality Date  . APPENDECTOMY  1979  . BREAST SURGERY  1979   MASTECTOMY AND RECONSTRUCTION  . DILATION AND CURETTAGE OF UTERUS     MISCARRIAGE  . EYE SURGERY Bilateral    w/ IOL  . HYSTEROSCOPY  [id number]   AND D&C  . MASTECTOMY Bilateral   . THYROIDECTOMY    . TONSILLECTOMY    . TOTAL KNEE ARTHROPLASTY Right 10/07/2013   Procedure: RIGHT TOTAL KNEE ARTHROPLASTY;  Surgeon: Kerin Salen, MD;  Location: [facility];  Service: Orthopedics;  Laterality: Right;  . TUBAL LIGATION      There were no vitals filed  for this visit.  Subjective Assessment - 06/06/19 1228    Subjective  Just in the past month, it feels like the Parkinson's has gotten worse.  Worse time is the morning.    Patient is accompained by:  Family member   husband, Michelle Sims   Pertinent History  PMH: PD, anxiety, arthritis, breast cancer, HTN, thyroid cancer, osteopenia, depression, B mastectomy 1979, chronic fatigue    How long can you walk comfortably?  approx. 3/10 of a mile    Patient Stated Goals  wants to be able to move more easily    Currently in Pain?  No/denies                        [facility] Adult PT Treatment/Exercise - 06/06/19 1233      Transfers   Transfers  Sit to Stand;Stand to Sit    Sit to Stand  5: Supervision;Without upper extremity assist;From chair/3-in-1    Sit to Stand Details  Verbal cues for sequencing;Verbal cues for technique;Tactile cues for initiation;Tactile cues for sequencing;Tactile cues for weight shifting    Sit to Stand Details (indicate cue type  and reason)  Cues for scooting forward, for foot placement, for forward lean, count to 3 for increased momentum and increased forward weigthshift to initiate standing    Stand to Sit  Without upper extremity assist;5: Supervision;To chair/3-in-1   extra time   Stand to Sit Details (indicate cue type and reason)  Verbal cues for sequencing;Verbal cues for technique;Tactile cues for sequencing    Transfer Cueing  Practiced multiple reps of transfers throughout session; pt does best with verbal cues for scoot forward, foot placement, and for forward lean, "nose over toes" with 3 count to rock and then stand.  PT provides min guard assistance and when pt asks this assistance to be removed, she has difficulty with enough forward lean to initiate standing.      Ambulation/Gait   Ambulation/Gait  Yes    Ambulation/Gait Assistance  4: Min guard;5: Supervision    Ambulation Distance (Feet)  60 Feet   100   Assistive device  Straight cane    Gait  Pattern  Step-through pattern;Step-to pattern;Decreased arm swing - right;Decreased arm swing - left;Decreased step length - left;Decreased step length - right;Decreased stride length;Decreased dorsiflexion - right;Decreased dorsiflexion - left;Right foot flat;Left foot flat;Narrow base of support;Trunk flexed;Shuffle;Festinating   Initial festination upon starting gait   Ambulation Surface  Level;Indoor    Gait velocity  19.84 sec with cane    Gait Comments  Additional 60 ft x 2 of gait with rollator, cues ofr increased step length.  Discussed trying to lower pt's rollator, as it is approx 2 inches too high; unable to do in time constraints of session, due to rollator bolted into place (will have to use tools to change height).      Standardized Balance Assessment   Standardized Balance Assessment  Timed Up and Go Test      Timed Up and Go Test   TUG  Normal TUG    Normal TUG (seconds)  24.16   with cane     Exercises   Exercises  Other Exercises    Other Exercises   Seated trunk flexion/posture exercise:  forward trunk lean to upright sitting, x 5 reps 2 sets.  Upon standing, lateral weightshifting x 10 reps, then x 5 reps.  Seated toe raises x 5 reps (pt reports already doing this at her exercise classes at [employer].)             PT Education - 06/06/19 [place]    Education Details  Educated in initial HEP-see instructions; transfer sequence and how husband can count/provide support for improved ease of transfers.    Person(s) Educated  Patient;Spouse    Methods  Explanation;Tactile cues;Verbal cues;Handout;Demonstration    Comprehension  Verbalized understanding;Returned demonstration;Verbal cues required;Need further instruction;Tactile cues required       PT Short Term Goals - 05/31/19 1426      PT SHORT TERM GOAL #1   Title  Pt will perform initial HEP with husband's supervision for improved balance and gait. ALL STGS DUE 06/28/19    Time  4    Period  Weeks    Status   New    Target Date  06/28/19      PT SHORT TERM GOAL #2   Title  Pt/husband will verbalize understanding of fall prevention in home environment.    Time  4    Period  Weeks    Status  New      PT SHORT TERM GOAL #3   Title  Pt will  demo improved functional transfers by demonstrating 5 sit <> stands with proper weight shift and erect posture in standing.    Baseline  currently does not stand fully erect with sit <> stands    Time  4    Period  Weeks    Status  New      PT SHORT TERM GOAL #4   Title  Pt will perform TUG with rollator to 15 seconds or less to decr fall risk, pt will undergo assessment of TUG with SPC.    Time  4    Period  Weeks    Status  New        PT Long Term Goals - 05/31/19 1427      PT LONG TERM GOAL #1   Title  Pt will be independent with final HEP with husband's supervision in order to build upon functional gains made in therapy. ALL LTGS DUE 07/26/19    Time  8    Period  Weeks    Status  New    Target Date  07/26/19      PT LONG TERM GOAL #2   Title  Pt will improve TUG score to less than or equal to 18 seconds with SPC vs. no AD for improving mobility.    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    Baseline  2.34 ft/sec with rollator    Time  8    Period  Weeks      PT LONG TERM GOAL #4   Title  Pt will ambulate at least 500 ft over indoor/outdoor surfaces, with supervision and appropriate assistive device, for improved community gait.    Time  8    Period  Weeks    Status  New      PT LONG TERM GOAL #5   Title  Pt will perform at least 10 sit <> stands in 30 seconds with no UE support to demo improved functional LE strength and endurance.    Time  8    Period  Weeks    Status  New            Plan - 06/06/19 1645    Clinical Impression Statement  Initiated HEP this visit, with  exercises focused on transfer training, trunk flexion for improved ability for forward posture and initial weightshifting upon standing (as pt reports festinating/feet sticking at times with initiation of gait).  Pt needs multiple trials, repetitions of sit<>stand with multi-modal cues for correct technique for imrpoved safety and ease of transfers.    Personal Factors and Comorbidities  Age;Comorbidity 3+;Time since onset of injury/illness/exacerbation;Past/Current Experience    Comorbidities  PMH: PD, anxiety, arthritis, breast cancer, HTN, thyroid cancer, osteopenia, depression, B mastectomy 1979, chronic fatigue    Examination-Activity Limitations  Transfers;Stand;Stairs;Squat;Bathing    Examination-Participation Restrictions  Cleaning;Community Activity;Meal Prep    Stability/Clinical Decision Making  Evolving/Moderate complexity    Rehab Potential  Good    PT Frequency  2x / week   2x week for 4 weeks, 1-2x week for 4 weeks   PT Duration  8 weeks    PT Treatment/Interventions  ADLs/Self Care Home Management;Gait training;Stair training;Functional mobility training;Neuromuscular re-education;Balance training;Therapeutic exercise;Therapeutic activities;Patient/family education;Energy conservation    PT Next Visit Plan  fall prevention  strategies in the home, review initial HEP for functional LE strength and balance and update as appropriate; monitor fatigue    Consulted and Agree with Plan of Care  Patient;Family member/caregiver    Family Member Consulted  husband, Michelle Sims       Patient will benefit from skilled therapeutic intervention in order to improve the following deficits and impairments:  Abnormal gait, Decreased activity tolerance, Decreased balance, Decreased endurance, Decreased safety awareness, Decreased knowledge of use of DME, Decreased strength, Difficulty walking, Postural dysfunction  Visit Diagnosis: Muscle weakness (generalized)  Unsteadiness on feet  Other  abnormalities of gait and mobility     Problem List Patient Active Problem List   Diagnosis Date Noted  . Parkinson's disease (tremor, stiffness, slow motion, unstable posture) ([place]) 11/13/2014  . Piriformis syndrome of left side 05/08/2014  . DDD (degenerative disc disease), lumbosacral 03/25/2014  . Abnormality of gait 03/25/2014  . Hypothyroidism 10/30/2013  . RBBB 10/07/2013  . Left shoulder pain 12/15/2011  . Postmenopause atrophic vaginitis 11/16/2011  . At high risk for falls 04/20/2011  . Blood type, Rh negative   . Thyroid cancer ([place])   . Endocervical polyp   . Osteoarthrosis, unspecified whether generalized or localized, involving lower leg 05/12/2009  . DEGENERATIVE DISC DISEASE, CERVICAL SPINE, W/RADICULOPATHY 04/30/2009  . BACK PAIN, THORACIC REGION, CHRONIC 04/30/2009  . DEPRESSION 11/14/2007  . Essential hypertension 11/14/2007  . CELLULITIS 11/14/2007  . BREAST CANCER, HX OF 11/14/2007  . THYROID CANCER, HX OF 11/14/2007    Ahaan Zobrist W. 06/06/2019, 4:49 PM  Frazier Butt., PT   [facility] [address] [place], [place], [address] Phone: [phone]   Fax:  [phone]  Name: BLANCHE KIMMINS MRN: [id number] Date of Birth: 1929/07/28

## 2019-06-06 NOTE — Telephone Encounter (Signed)
Spoke to husband and wife (home from therapy).  Pt took 1/2 tab of melatonin 1mg  and was too groggy, brain fog so did not take it anymore. Fatigue from wakening to mid afternoon.  Also gait worsening, unsteady, feels like regressing.  Just started PT.  Feels like some depression. (see pcp).  Husband has ideas of what to do, pt does not want to do.  May will take what you say.  Please advise.  Wanted  3 mo appt moved up (kept the 6 mon in oct as well).

## 2019-06-06 NOTE — Patient Instructions (Signed)
Access Code: [email or web address] URL: [email or web address] Date: 06/06/2019 Prepared by: Mady Haagensen  Exercises Sit to Stand with Hands on Knees - 1-2 x daily - 7 x weekly - 2 sets - 5 reps Side to side weightshift - 1 x daily - 7 x weekly - 1 sets - 10 reps Seated Active Hip Flexion - 1 x daily - 7 x weekly - 1-2 sets - 5 reps

## 2019-06-06 NOTE — Telephone Encounter (Signed)
If the patient just started physical therapy I feel that we should give this some more time to determine if it is beneficial.  Not sure that she needs a sooner appointment at this time.  If the patient is having symptoms of depression please encourage her to follow-up with her primary care.

## 2019-06-06 NOTE — Telephone Encounter (Signed)
I called husband back and relayed that per MM/NP she recommended to see pcp for depression. Jinny Blossom aslo wanted pt to continue with therapy and see how she does (give it more time).  She did not address the melatonin,but would continue to try again to see if this would help her with sleep.  He verbalized understanding.  Appt kept for now at 3 months.

## 2019-06-11 ENCOUNTER — Ambulatory Visit: Payer: BC Managed Care – PPO | Admitting: Physical Therapy

## 2019-06-12 DIAGNOSIS — H52203 Unspecified astigmatism, bilateral: Secondary | ICD-10-CM | POA: Diagnosis not present

## 2019-06-12 DIAGNOSIS — Z961 Presence of intraocular lens: Secondary | ICD-10-CM | POA: Diagnosis not present

## 2019-06-14 ENCOUNTER — Ambulatory Visit: Payer: BC Managed Care – PPO | Admitting: Physical Therapy

## 2019-06-14 ENCOUNTER — Other Ambulatory Visit: Payer: Self-pay

## 2019-06-14 DIAGNOSIS — R2681 Unsteadiness on feet: Secondary | ICD-10-CM | POA: Diagnosis not present

## 2019-06-14 DIAGNOSIS — M6281 Muscle weakness (generalized): Secondary | ICD-10-CM | POA: Diagnosis not present

## 2019-06-14 DIAGNOSIS — R293 Abnormal posture: Secondary | ICD-10-CM

## 2019-06-14 DIAGNOSIS — Z9181 History of falling: Secondary | ICD-10-CM | POA: Diagnosis not present

## 2019-06-14 DIAGNOSIS — R2689 Other abnormalities of gait and mobility: Secondary | ICD-10-CM | POA: Diagnosis not present

## 2019-06-14 NOTE — Therapy (Signed)
[facility] [address] [place], [place], [address] Phone: [phone]   Fax:  [phone]  Physical Therapy Treatment  Patient Details  Name: Michelle Sims MRN: [id number] Date of Birth: 02-Apr-1929 Referring Provider (PT): Ernestine Mcmurray, NP    Encounter Date: 06/14/2019  PT End of Session - 06/14/19 1612    Visit Number  3    Number of Visits  17    Date for PT Re-Evaluation  08/29/19   written for 60 day POC   Authorization Type  BCBS    PT Start Time  1402    PT Stop Time  1444    PT Time Calculation (min)  42 min    Equipment Utilized During Treatment  Gait belt    Activity Tolerance  Patient tolerated treatment well;Patient limited by fatigue    Behavior During Therapy  [facility] for tasks assessed/performed       Past Medical History:  Diagnosis Date  . Anxiety   . Arthritis    knee & back   . Blood type, Rh negative   . Breast cancer ([place])   . Chronic fatigue   . Depression   . Diverticulosis   . Endocervical polyp   . GERD (gastroesophageal reflux disease)   . Hypertension   . Hypothyroidism   . IBS (irritable bowel syndrome)   . Insomnia   . Lymphedema   . Neuromuscular disorder ([place])    Parkinson's disease   . Osteopenia 06/2015   T score -2.4 distal third of the radius  . Parkinson disease ([place])   . RBBB 10/07/2013  . Thyroid cancer [facility])     Past Surgical History:  Procedure Laterality Date  . APPENDECTOMY  1979  . BREAST SURGERY  1979   MASTECTOMY AND RECONSTRUCTION  . DILATION AND CURETTAGE OF UTERUS     MISCARRIAGE  . EYE SURGERY Bilateral    w/ IOL  . HYSTEROSCOPY  [id number]   AND D&C  . MASTECTOMY Bilateral   . THYROIDECTOMY    . TONSILLECTOMY    . TOTAL KNEE ARTHROPLASTY Right 10/07/2013   Procedure: RIGHT TOTAL KNEE ARTHROPLASTY;  Surgeon: Kerin Salen, MD;  Location: [facility];  Service: Orthopedics;  Laterality: Right;  . TUBAL LIGATION      There were no vitals filed  for this visit.  Subjective Assessment - 06/14/19 1406    Subjective  Hasn't been sleeping well. Has been trying the melatonin - feels like she has a foggy feeling all morning. The exercise where she is bending forward hurt her back. Has been doing a lot of walking. Bumped her left arm in the bed and it was bleeding a couple days ago. Hasn't completely healed yet.    Patient is accompained by:  Family member   husband, [name]   Pertinent History  PMH: PD, anxiety, arthritis, breast cancer, HTN, thyroid cancer, osteopenia, depression, B mastectomy 1979, chronic fatigue    How long can you walk comfortably?  approx. 3/10 of a mile    Patient Stated Goals  wants to be able to move more easily    Currently in Pain?  No/denies                   Outpatient Re[facility]/02/[facility]  Lymphedema Life Impact Scale Total Score  13.24 %           [facility] Adult PT Treatment/Exercise - 06/14/19 0001  Transfers   Transfers  Sit to Stand;Stand to Sit    Sit to Stand  5: Supervision;4: Min guard;Without upper extremity assist;With upper extremity assist;From chair/3-in-1    Sit to Stand Details  Verbal cues for sequencing;Verbal cues for technique;Tactile cues for sequencing;Tactile cues for weight shifting;Visual cues/gestures for sequencing    Sit to Stand Details (indicate cue type and reason)  cues for scooting forward, proper foot placement, and nose over toes - using momentum and counting to 3 before coming to stand, pt doing well with visual cues of therapist also demonstrating rocking and counting to 3 with incr intensity to stand    Stand to Sit  Without upper extremity assist;5: Supervision;To chair/3-in-1    Stand to Sit Details (indicate cue type and reason)  Verbal cues for sequencing;Verbal cues for technique;Tactile cues for sequencing    Transfer Cueing  pt needing verbal and demo cues for proper rollator brake management before  stand > sit transfer. cues to keep rollator close and to fully turn before sitting down     Comments  multiple reps of sit <> stands performed throughout session, performing initially with BUE support from arm chair and then performed with B hands on knees. cues for tall posture in standing before slowly lowering to sit and cues to "hinge" from hips       Ambulation/Gait   Ambulation/Gait  Yes    Ambulation/Gait Assistance  5: Supervision    Ambulation/Gait Assistance Details  cues for increased step length B and heel strike - "keeping feet under the seat" in order to be closer to rollator and promote upright posture    Ambulation Distance (Feet)  230 Feet    Assistive device  Rollator    Gait Pattern  Step-through pattern;Step-to pattern;Decreased arm swing - right;Decreased arm swing - left;Decreased step length - left;Decreased step length - right;Decreased stride length;Decreased dorsiflexion - right;Decreased dorsiflexion - left;Right foot flat;Left foot flat;Narrow base of support;Trunk flexed;Shuffle    Ambulation Surface  Level;Indoor    Gait Comments  lowered rollator 2 notches for proper height with pt demonstrating improved posture with gait          Access Code: [email or web address] URL: [email or web address] Date: 06/14/2019 Prepared by: Janann August  Reviewed previous HEP as well as new additions (bolded below). Removed seated forward weight shift and then back up to sitting as pt reporting it was causing her an increase in low back pain.   Exercises Sit to Stand with Hands on Knees - 1-2 x daily - 7 x weekly - 2 sets - 5 reps Side to side weightshift - 1 x daily - 7 x weekly - 1 sets - 10 reps Seated March - 1 x daily - 7 x weekly - 2 sets - 10 reps - cues for intensity with movement, especially with RLE  Seated Hip Abduction with Resistance - 1 x daily - 7 x weekly - 2 sets - 10 reps Seated Heel Toe Raises - 2 x daily - 7 x weekly - 2 sets - 10 reps      PT  Education - 06/14/19 1611    Education Details  reviewed sit <> stand transfer training, updated HEP    Person(s) Educated  Patient;Spouse    Methods  Explanation;Demonstration;Handout;Verbal cues    Comprehension  Verbalized understanding;Returned demonstration;Verbal cues required;Need further instruction       PT Short Term Goals - 05/31/19 1426      PT SHORT TERM GOAL #1  Title  Pt will perform initial HEP with husband's supervision for improved balance and gait. ALL STGS DUE 06/28/19    Time  4    Period  Weeks    Status  New    Target Date  06/28/19      PT SHORT TERM GOAL #2   Title  Pt/husband will verbalize understanding of fall prevention in home environment.    Time  4    Period  Weeks    Status  New      PT SHORT TERM GOAL #3   Title  Pt will demo improved functional transfers by demonstrating 5 sit <> stands with proper weight shift and erect posture in standing.    Baseline  currently does not stand fully erect with sit <> stands    Time  4    Period  Weeks    Status  New      PT SHORT TERM GOAL #4   Title  Pt will perform TUG with rollator to 15 seconds or less to decr fall risk, pt will undergo assessment of TUG with SPC.    Time  4    Period  Weeks    Status  New        PT Long Term Goals - 05/31/19 1427      PT LONG TERM GOAL #1   Title  Pt will be independent with final HEP with husband's supervision in order to build upon functional gains made in therapy. ALL LTGS DUE 07/26/19    Time  8    Period  Weeks    Status  New    Target Date  07/26/19      PT LONG TERM GOAL #2   Title  Pt will improve TUG score to less than or equal to 18 seconds with SPC vs. no AD for improving mobility.    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    Baseline  2.34 ft/sec with rollator    Time  8    Period  Weeks       PT LONG TERM GOAL #4   Title  Pt will ambulate at least 500 ft over indoor/outdoor surfaces, with supervision and appropriate assistive device, for improved community gait.    Time  8    Period  Weeks    Status  New      PT LONG TERM GOAL #5   Title  Pt will perform at least 10 sit <> stands in 30 seconds with no UE support to demo improved functional LE strength and endurance.    Time  8    Period  Weeks    Status  New            Plan - 06/14/19 1623    Clinical Impression Statement  Reviewed prior HEP as well as added seated BLE strengthening exercises. Removed seated forward flexion and then to upright posture due to pt having incr low back pain. Lowered pt's rollator height by 2 notches to proper height with pt demonstrating improved posture, continues to need cues for incr step length B. Reviewed sit <> stand transfers with multi-modal cues for proper technique and use of counting/momentum to come to stand. Will continue to progress towards LTGs.  Personal Factors and Comorbidities  Age;Comorbidity 3+;Time since onset of injury/illness/exacerbation;Past/Current Experience    Comorbidities  PMH: PD, anxiety, arthritis, breast cancer, HTN, thyroid cancer, osteopenia, depression, B mastectomy 1979, chronic fatigue    Examination-Activity Limitations  Transfers;Stand;Stairs;Squat;Bathing    Examination-Participation Restrictions  Cleaning;Community Activity;Meal Prep    Stability/Clinical Decision Making  Evolving/Moderate complexity    Rehab Potential  Good    PT Frequency  2x / week   2x week for 4 weeks, 1-2x week for 4 weeks   PT Duration  8 weeks    PT Treatment/Interventions  ADLs/Self Care Home Management;Gait training;Stair training;Functional mobility training;Neuromuscular re-education;Balance training;Therapeutic exercise;Therapeutic activities;Patient/family education;Energy conservation    PT Next Visit Plan  fall prevention strategies in the home, update HEP as  appropriate; gait training with rollator/SPC, transfer training, standing balance /weight shifting. monitor fatigue    Consulted and Agree with Plan of Care  Patient;Family member/caregiver    Family Member Consulted  husband, [name]       Patient will benefit from skilled therapeutic intervention in order to improve the following deficits and impairments:  Abnormal gait, Decreased activity tolerance, Decreased balance, Decreased endurance, Decreased safety awareness, Decreased knowledge of use of DME, Decreased strength, Difficulty walking, Postural dysfunction  Visit Diagnosis: Muscle weakness (generalized)  Unsteadiness on feet  Other abnormalities of gait and mobility  Abnormal posture  History of falling     Problem List Patient Active Problem List   Diagnosis Date Noted  . Parkinson's disease (tremor, stiffness, slow motion, unstable posture) ([place]) 11/13/2014  . Piriformis syndrome of left side 05/08/2014  . DDD (degenerative disc disease), lumbosacral 03/25/2014  . Abnormality of gait 03/25/2014  . Hypothyroidism 10/30/2013  . RBBB 10/07/2013  . Left shoulder pain 12/15/2011  . Postmenopause atrophic vaginitis 11/16/2011  . At high risk for falls 04/20/2011  . Blood type, Rh negative   . Thyroid cancer ([place])   . Endocervical polyp   . Osteoarthrosis, unspecified whether generalized or localized, involving lower leg 05/12/2009  . DEGENERATIVE DISC DISEASE, CERVICAL SPINE, W/RADICULOPATHY 04/30/2009  . BACK PAIN, THORACIC REGION, CHRONIC 04/30/2009  . DEPRESSION 11/14/2007  . Essential hypertension 11/14/2007  . CELLULITIS 11/14/2007  . BREAST CANCER, HX OF 11/14/2007  . THYROID CANCER, HX OF 11/14/2007    Arliss Journey, PT, DPT  06/14/2019, 4:25 PM  [facility] [address] [place], [place], [address] Phone: [phone]   Fax:  [phone]  Name: ANNY GROGG MRN: [id number] Date of  Birth: 08-Oct-1929

## 2019-06-14 NOTE — Patient Instructions (Signed)
Access Code: [email or web address] URL: [email or web address] Date: 06/14/2019 Prepared by: Janann August  Exercises Sit to Stand with Hands on Knees - 1-2 x daily - 7 x weekly - 2 sets - 5 reps Side to side weightshift - 1 x daily - 7 x weekly - 1 sets - 10 reps Seated March - 1 x daily - 7 x weekly - 2 sets - 10 reps Seated Hip Abduction with Resistance - 1 x daily - 7 x weekly - 2 sets - 10 reps Seated Heel Toe Raises - 2 x daily - 7 x weekly - 2 sets - 10 reps

## 2019-06-21 ENCOUNTER — Ambulatory Visit: Payer: BC Managed Care – PPO | Admitting: Physical Therapy

## 2019-06-25 ENCOUNTER — Ambulatory Visit: Payer: BC Managed Care – PPO | Admitting: Physical Therapy

## 2019-06-27 ENCOUNTER — Ambulatory Visit: Payer: BC Managed Care – PPO | Admitting: Physical Therapy

## 2019-07-02 ENCOUNTER — Ambulatory Visit: Payer: BC Managed Care – PPO | Admitting: Physical Therapy

## 2019-07-05 ENCOUNTER — Ambulatory Visit: Payer: BC Managed Care – PPO | Admitting: Physical Therapy

## 2019-07-09 ENCOUNTER — Ambulatory Visit: Payer: BC Managed Care – PPO | Admitting: Physical Therapy

## 2019-07-16 ENCOUNTER — Ambulatory Visit: Payer: BC Managed Care – PPO | Admitting: Physical Therapy

## 2019-07-16 DIAGNOSIS — R5382 Chronic fatigue, unspecified: Secondary | ICD-10-CM | POA: Diagnosis not present

## 2019-07-16 DIAGNOSIS — G4700 Insomnia, unspecified: Secondary | ICD-10-CM | POA: Diagnosis not present

## 2019-07-16 DIAGNOSIS — R413 Other amnesia: Secondary | ICD-10-CM | POA: Diagnosis not present

## 2019-07-16 DIAGNOSIS — G214 Vascular parkinsonism: Secondary | ICD-10-CM | POA: Diagnosis not present

## 2019-07-17 ENCOUNTER — Telehealth: Payer: Self-pay | Admitting: Adult Health

## 2019-07-17 NOTE — Telephone Encounter (Signed)
I called Mr. Shipman back (on [place]). He stated the pt has worsened since she last saw [place]. More confused, unsteady on feet. She fell yesterday (mentioned she was dizzy), not a hard fall, on the carpet. She caught herself, no injuries. They were on the way to PCP office for check up so she was examined, fine, no bruises. They also discussed many things. He states PCP felt the depression was not a issue at this time. He was not told to have neurology see sooner. Pt is unsteady again today. He wanted to confirm pt has the appt scheduled for 08/06/19 at 2:30 pm and he would like to bring her in sooner if available. Pt no longer doing PT in outpatient rehab. She does go to the fitness center daily at [employer] and works with staff member. He is unsure if they are a PT. Spoke with MM NP. Continue plan for follow up 08/06/19. He verbalized appreciation for the call. He is available virtually anytime to bring her.

## 2019-07-17 NOTE — Telephone Encounter (Signed)
Pt's husband called needing to discus with RN a couple of things that are going on with the pt that are concerning to him including a fall that she just had. Please advise.

## 2019-07-23 ENCOUNTER — Ambulatory Visit: Payer: BC Managed Care – PPO | Admitting: Physical Therapy

## 2019-07-25 ENCOUNTER — Other Ambulatory Visit: Payer: Self-pay

## 2019-07-25 ENCOUNTER — Ambulatory Visit: Payer: BC Managed Care – PPO | Admitting: Adult Health

## 2019-07-25 ENCOUNTER — Encounter: Payer: Self-pay | Admitting: Adult Health

## 2019-07-25 ENCOUNTER — Ambulatory Visit (INDEPENDENT_AMBULATORY_CARE_PROVIDER_SITE_OTHER): Payer: BC Managed Care – PPO | Admitting: Adult Health

## 2019-07-25 VITALS — BP 161/78 | HR 78 | Ht 63.0 in | Wt 130.0 lb

## 2019-07-25 DIAGNOSIS — G20 Parkinson's disease: Secondary | ICD-10-CM

## 2019-07-25 MED ORDER — RIVASTIGMINE 4.6 MG/24HR TD PT24: 4.6000 mg | MEDICATED_PATCH | Freq: Every day | TRANSDERMAL | 5 refills | Status: DC

## 2019-07-25 NOTE — Patient Instructions (Signed)
Your Plan:  Continue Sinemet IR and Sinemet CR Reduce Exelon Dose to 4.6 patch  Thank you for coming to see [place] at [facility]. I hope we have been able to provide you high quality care today.  You may receive a patient satisfaction survey over the next few weeks. We would appreciate your feedback and comments so that we may continue to improve ourselves and the health of our patients.

## 2019-07-25 NOTE — Progress Notes (Addendum)
PATIENT: Michelle Sims DOB: 03-Jul-1929  REASON FOR VISIT: follow up HISTORY FROM: patient  HISTORY OF PRESENT ILLNESS: Today 07/25/19:  Michelle Sims is an 86 year old female with a history of parkinsonism.  She returns today for follow-up.  She feels that her balance has continued to worsen.  She did have a fall approximately 1 week ago.  She did see Michelle Sims and had an exam right after her fall.  She states that she felt weak and saw white spots and fell backwards.  Patient states that she fell on her back but did not hit her head.  She continues on Sinemet IR and Sinemet CR.  She also is on Exelon patch.  She reports extreme fatigue.  States that she goes to bed at 10 PM but when she wakes up she is still tired.  Reports that she is tired throughout the day.  She required several naps.  She does participate in a daily exercise class at [facility] for mobility and balance.  She returns today for an evaluation.  HISTORY 05/01/19:  Michelle Sims is an 86 year old female with a history of Parkinson's disease.  She returns today for follow-up.  Her husband reports that they have been seeing Michelle Sims in [place].  Reports that she feels that the patient has vascular Parkinson's.  Patient reports that she does not have a tremor in the hands.  Occasionally she will have a tremor in the legs when she stands.  Denies any trouble swallowing food or liquids.  She uses a Rollator when ambulating.  She reports that she had a fall a few months ago.  Reports that she tripped over her rug.  Fortunately she did not suffer any significant injuries.  Reports that she continues to struggle to fall asleep and stay asleep.  Often feels tired and has to nap during the day.  PCP is weaning her off of Cymbalta to see if this helps with daytime sleepiness.  She remains on Sinemet taking 1 tablet 4 times a day and Sinemet CR at bedtime.  She also remains on the Exelon patch.  REVIEW OF SYSTEMS: Out of a complete  14 system review of symptoms, the patient complains only of the following symptoms, and all other reviewed systems are negative.  See HPI  ALLERGIES: Allergies  Allergen Reactions  . Sulfa Antibiotics Rash  . Biaxin [Clarithromycin] Other (See Comments)    Pt does not remember reaction   . Ciprofloxacin Nausea And Vomiting  . Hctz [Hydrochlorothiazide] Other (See Comments)    weakness and hyponatremia  . Macrobid [Nitrofurantoin] Nausea Only  . Other Nausea Only  . Prednisone Other (See Comments)    Swelling and facial redness   . Sulfamethoxazole-Trimethoprim Other (See Comments) and Rash    Sore throat    HOME MEDICATIONS: Outpatient Medications Prior to Visit  Medication Sig Dispense Refill  . amLODipine (NORVASC) 2.5 MG tablet Take 2.5 mg by mouth daily.    . Ascorbic Acid (VITAMIN C PO) Take by mouth.    Marland Kitchen aspirin EC 81 MG tablet Take 81 mg by mouth daily.    . Calcium Carb-Cholecalciferol (CALCIUM 600 + D PO) Take 1 tablet by mouth daily with lunch.    . carbidopa-levodopa (SINEMET IR) 25-100 MG tablet TAKE 1 & 1/2 TAB IN THE AM, 1 TAB MID AM, 1 & 1/2 TAB IN THE AFTERNOON, 1 TAB IN THE EVENING 450 tablet 5  . Carbidopa-Levodopa ER (SINEMET CR) 25-100 MG tablet controlled  release TAKE 1 TABLET BY MOUTH EVERYDAY AT BEDTIME 90 tablet 1  . cholecalciferol (VITAMIN D) 1000 units tablet Take 2,000 Units by mouth daily.    Marland Kitchen co-enzyme Q-10 30 MG capsule Take 30 mg by mouth 3 (three) times daily.    . Cranberry (SM CRANBERRY) 300 MG tablet Take 300 mg by mouth daily.    . fosfomycin (MONUROL) 3 g PACK Mix in 4 ounces of water and Administer immediately 3 g 0  . irbesartan (AVAPRO) 75 MG tablet Take 37.5 mg by mouth at bedtime.     Marland Kitchen levothyroxine (SYNTHROID, LEVOTHROID) 112 MCG tablet Take 112 mcg by mouth daily before breakfast.    . MYRBETRIQ 25 MG TB24 tablet Take 25 mg by mouth daily.  1  . NONFORMULARY OR COMPOUNDED ITEM Estradiol vag cream 0.02% (0.2mg /ml)  S:  Insert 1 ml  (1 syringe) into the vagina hs 2 times weekly as directed. 24 each 4  . nystatin-triamcinolone ointment (MYCOLOG) Apply 1 application topically 2 (two) times daily. 30 g 0  . raloxifene (EVISTA) 60 MG tablet TAKE 1 TABLET (60 MG TOTAL) BY MOUTH DAILY. 90 tablet 2  . rivastigmine (EXELON) 9.5 mg/24hr PLACE 1 PATCH (9.5 MG TOTAL) ONTO THE SKIN DAILY. 90 patch 1  . DULoxetine (CYMBALTA) 30 MG capsule Take 1 capsule (30 mg total) by mouth 2 (two) times daily. 180 capsule 0   No facility-administered medications prior to visit.    PAST MEDICAL HISTORY: Past Medical History:  Diagnosis Date  . Anxiety   . Arthritis    knee & back   . Blood type, Rh negative   . Breast cancer ([place])   . Chronic fatigue   . Depression   . Diverticulosis   . Endocervical polyp   . GERD (gastroesophageal reflux disease)   . Hypertension   . Hypothyroidism   . IBS (irritable bowel syndrome)   . Insomnia   . Lymphedema   . Neuromuscular disorder ([place])    Parkinson's disease   . Osteopenia 06/2015   T score -2.4 distal third of the radius  . Parkinson disease ([place])   . RBBB 10/07/2013  . Thyroid cancer ([place])     PAST SURGICAL HISTORY: Past Surgical History:  Procedure Laterality Date  . APPENDECTOMY  1979  . BREAST SURGERY  1979   MASTECTOMY AND RECONSTRUCTION  . DILATION AND CURETTAGE OF UTERUS     MISCARRIAGE  . EYE SURGERY Bilateral    w/ IOL  . HYSTEROSCOPY  [id number]   AND D&C  . MASTECTOMY Bilateral   . THYROIDECTOMY    . TONSILLECTOMY    . TOTAL KNEE ARTHROPLASTY Right 10/07/2013   Procedure: RIGHT TOTAL KNEE ARTHROPLASTY;  Surgeon: Kerin Salen, MD;  Location: [facility];  Service: Orthopedics;  Laterality: Right;  . TUBAL LIGATION      FAMILY HISTORY: Family History  Problem Relation Age of Onset  . Hypertension Mother   . Hyperlipidemia Mother   . Heart failure Mother   . Hypertension Father   . Hyperlipidemia Father   . Heart failure Father   . Hyperlipidemia Brother   .  Hypertension Brother   . Heart attack Brother   . Breast cancer Neg Hx     SOCIAL HISTORY: Social History   Socioeconomic History  . Marital status: Married    Spouse name: Not on file  . Number of children: Not on file  . Years of education: Not on file  . Highest education level:  Not on file  Occupational History  . Occupation: retired    Comment: "odd jobs"  Tobacco Use  . Smoking status: Never Smoker  . Smokeless tobacco: Never Used  Vaping Use  . Vaping Use: Never used  Substance and Sexual Activity  . Alcohol use: Yes    Alcohol/week: 0.0 standard drinks    Comment: rare  . Drug use: No  . Sexual activity: Never    Birth control/protection: Surgical, Post-menopausal    Comment: 1st intercourse 86 yo-Fewer than 5 partners  Other Topics Concern  . Not on file  Social History Narrative  . Not on file   Social Determinants of Health   Financial Resource Strain:   . Difficulty of Paying Living Expenses:   Food Insecurity:   . Worried About Charity fundraiser in the Last Year:   . Arboriculturist in the Last Year:   Transportation Needs:   . Film/video editor (Medical):   Marland Kitchen Lack of Transportation (Non-Medical):   Physical Activity:   . Days of Exercise per Week:   . Minutes of Exercise per Session:   Stress:   . Feeling of Stress :   Social Connections:   . Frequency of Communication with Friends and Family:   . Frequency of Social Gatherings with Friends and Family:   . Attends Religious Services:   . Active Member of Clubs or Organizations:   . Attends Archivist Meetings:   Marland Kitchen Marital Status:   Intimate Partner Violence:   . Fear of Current or Ex-Partner:   . Emotionally Abused:   Marland Kitchen Physically Abused:   . Sexually Abused:       PHYSICAL EXAM  Vitals:   07/25/19 1038  BP: (!) 161/78  Pulse: 78  Weight: 130 lb (59 kg)  Height: 5\' 3"  (1.6 m)   Body mass index is 23.03 kg/m.  Generalized: Well developed, in no acute distress     Neurological examination  Mentation: Alert oriented to time, place, history taking. Follows all commands speech and language fluent Cranial nerve II-XII: Pupils were equal round reactive to light. Extraocular movements were full, visual field were full on confrontational test.  Head turning and shoulder shrug  were normal and symmetric. Motor: The motor testing reveals 5 over 5 strength of all 4 extremities. Good symmetric motor tone is noted throughout.  Sensory: Sensory testing is intact to soft touch on all 4 extremities. No evidence of extinction is noted.  Coordination: Cerebellar testing reveals good finger-nose-finger and heel-to-shin bilaterally.  Gait and station: Patient is able to stand from a seated position with assistance.  She uses a Rollator when ambulating.  Good stride with occasional shuffling gait.  Multiple steps for turns.   DIAGNOSTIC DATA (LABS, IMAGING, TESTING) - I reviewed patient records, labs, notes, testing and imaging myself where available.  Lab Results  Component Value Date   WBC 8.6 10/15/2013   HGB 10.4 (A) 10/15/2013   HCT 31 (A) 10/15/2013   MCV 92.5 10/10/2013   PLT 314 10/15/2013      Component Value Date/Time   NA 139 10/15/2013 0000   K 3.9 10/15/2013 0000   CL 101 10/08/2013 0440   CO2 24 10/08/2013 0440   GLUCOSE 151 (H) 10/08/2013 0440   BUN 18 10/15/2013 0000   CREATININE 0.7 10/15/2013 0000   CREATININE 0.57 10/08/2013 0440   CALCIUM 8.1 (L) 10/08/2013 0440   GFRNONAA 84 (L) 10/08/2013 0440   GFRAA >90 10/08/2013  0440   No results found for: CHOL, HDL, LDLCALC, LDLDIRECT, TRIG, CHOLHDL Lab Results  Component Value Date   HGBA1C 5.6 10/15/2013   No results found for: VITAMINB12 No results found for: TSH    ASSESSMENT AND PLAN 86 y.o. year old female  has a past medical history of Anxiety, Arthritis, Blood type, Rh negative, Breast cancer ([place]), Chronic fatigue, Depression, Diverticulosis, Endocervical polyp, GERD  (gastroesophageal reflux disease), Hypertension, Hypothyroidism, IBS (irritable bowel syndrome), Insomnia, Lymphedema, Neuromuscular disorder ([place]), Osteopenia (06/2015), Parkinson disease ([place]), RBBB (10/07/2013), and Thyroid cancer ([place]). here with:  Parkinson's disease   Continue Sinemet IR and Sinemet CR  Will reduce dose of Exelon to 4.6 mg to see if this helps with fatigue  Encouraged the patient to stay well-hydrated.  Discussed physical therapy but they did not feel that the last round of physical therapy was beneficial  We will follow-up in 6 months  The patient will call in a month to let me know how she is doing on the decreased dose of Exelon.  I reviewed the above note and documentation by the Nurse Practitioner and agree with the history, exam, assessment and plan as outlined above. I was available for consultation. Star Age, MD, PhD [facility]   I spent 30 minutes of face-to-face and non-face-to-face time with patient.  This included previsit chart review, lab review, study review, order entry, electronic health record documentation, patient education.  Ward Givens, MSN, NP-C 07/25/2019, 10:58 AM [facility] [address], [place], [place] [address] [phone]

## 2019-08-06 ENCOUNTER — Ambulatory Visit: Payer: Self-pay | Admitting: Adult Health

## 2019-08-15 DIAGNOSIS — N952 Postmenopausal atrophic vaginitis: Secondary | ICD-10-CM | POA: Diagnosis not present

## 2019-08-15 DIAGNOSIS — N3281 Overactive bladder: Secondary | ICD-10-CM | POA: Diagnosis not present

## 2019-08-15 DIAGNOSIS — N390 Urinary tract infection, site not specified: Secondary | ICD-10-CM | POA: Diagnosis not present

## 2019-08-15 DIAGNOSIS — N3941 Urge incontinence: Secondary | ICD-10-CM | POA: Diagnosis not present

## 2019-08-27 ENCOUNTER — Telehealth: Payer: Self-pay | Admitting: Adult Health

## 2019-08-27 NOTE — Telephone Encounter (Signed)
I spoke to husband,  He relayed that was checking in exelon patch did not affect fatigue.  She continues with fatigue.  Her gait/mobility worse and cannot get out of chair.  She has freezing spells at times.  Using walker and cane.  Does [facility] classes [facility] times week at [facility].  Did not feel like PT was helpful.  See's Dr. Inda Merlin 09-10-19.  Taking Sinemet 25/100 IR  8-11-3-8 and then 25/100 ER qhs.

## 2019-08-27 NOTE — Telephone Encounter (Signed)
Pt's husband, September Mormile (on [place]) calling back in month to let nurse know how Pt is doing.Mr. Charlie said wife is getting worse. Would like a call from the nurse.

## 2019-08-28 NOTE — Telephone Encounter (Signed)
I think Dr. Rexene Alberts may need to see this patient to offer suggestions. Compton Brigance: Does she have any openings?

## 2019-08-28 NOTE — Telephone Encounter (Signed)
Can we call the patient and see if he is amendable with me consulting with Dr. Rexene Alberts when she returns on 8/17- if he feels that this is urgent then I can involve another MD.

## 2019-08-28 NOTE — Telephone Encounter (Signed)
Right now Dr. Guadelupe Sabin next available OV is oct 6th. I can offer appt if you agree to time frame.  Dr. Rexene Alberts will be back in the office on 09/10/2019.

## 2019-08-29 NOTE — Telephone Encounter (Signed)
I called husband.  Ok to wait until 09-10-19 when Dr. Rexene Alberts returns for recommendations.  I did make appt with Dr. Rexene Alberts on 10-28-19 at 1500.  Placed also on waitlist.

## 2019-09-02 NOTE — Telephone Encounter (Signed)
Husband left a voicemail asking for a call from [facility].  Husband is asking if pt can go back to the original strength of the patch because pt actually did some better on it versus the confusion she is having on this lesser dose in the current patch

## 2019-09-03 ENCOUNTER — Emergency Department (HOSPITAL_COMMUNITY)
Admission: EM | Admit: 2019-09-03 | Discharge: 2019-09-03 | Disposition: A | Discharge status: Home / Self Care | Payer: BC Managed Care – PPO | Attending: Emergency Medicine | Admitting: Emergency Medicine

## 2019-09-03 DIAGNOSIS — R109 Unspecified abdominal pain: Secondary | ICD-10-CM | POA: Diagnosis not present

## 2019-09-03 DIAGNOSIS — R197 Diarrhea, unspecified: Secondary | ICD-10-CM | POA: Diagnosis present

## 2019-09-03 DIAGNOSIS — Z5321 Procedure and treatment not carried out due to patient leaving prior to being seen by health care provider: Secondary | ICD-10-CM | POA: Diagnosis not present

## 2019-09-03 LAB — URINALYSIS, ROUTINE W REFLEX MICROSCOPIC
Bacteria, UA: NONE SEEN
Bilirubin Urine: NEGATIVE
Glucose, UA: NEGATIVE mg/dL
Hgb urine dipstick: NEGATIVE
Ketones, ur: 5 mg/dL — AB
Nitrite: NEGATIVE
Protein, ur: NEGATIVE mg/dL
Specific Gravity, Urine: 1.017 (ref 1.005–1.030)
pH: 6 (ref 5.0–8.0)

## 2019-09-03 LAB — COMPREHENSIVE METABOLIC PANEL
ALT: 6 U/L (ref 0–44)
AST: 16 U/L (ref 15–41)
Albumin: 3.9 g/dL (ref 3.5–5.0)
Alkaline Phosphatase: 67 U/L (ref 38–126)
Anion gap: 8 (ref 5–15)
BUN: 25 mg/dL — ABNORMAL HIGH (ref 8–23)
CO2: 24 mmol/L (ref 22–32)
Calcium: 8.5 mg/dL — ABNORMAL LOW (ref 8.9–10.3)
Chloride: 106 mmol/L (ref 98–111)
Creatinine, Ser: 0.69 mg/dL (ref 0.44–1.00)
GFR calc Af Amer: >60 mL/min (ref >60)
GFR calc non Af Amer: >60 mL/min (ref >60)
Glucose, Bld: 96 mg/dL (ref 70–99)
Potassium: 4.2 mmol/L (ref 3.5–5.1)
Sodium: 138 mmol/L (ref 135–145)
Total Bilirubin: 0.8 mg/dL (ref 0.3–1.2)
Total Protein: 6.8 g/dL (ref 6.5–8.1)

## 2019-09-03 LAB — CBC
HCT: 40 % (ref 36.0–46.0)
Hemoglobin: 13 g/dL (ref 12.0–15.0)
MCH: 32 pg (ref 26.0–34.0)
MCHC: 32.5 g/dL (ref 30.0–36.0)
MCV: 98.5 fL (ref 80.0–100.0)
Platelets: 186 10*3/uL (ref 150–400)
RBC: 4.06 MIL/uL (ref 3.87–5.11)
RDW: 13.2 % (ref 11.5–15.5)
WBC: 9.4 10*3/uL (ref 4.0–10.5)
nRBC: 0 % (ref 0.0–0.2)

## 2019-09-03 LAB — LIPASE, BLOOD: Lipase: 27 U/L (ref 11–51)

## 2019-09-03 MED ORDER — RIVASTIGMINE 9.5 MG/24HR TD PT24: 9.5000 mg | MEDICATED_PATCH | Freq: Every day | TRANSDERMAL | 5 refills | Status: DC

## 2019-09-03 NOTE — Addendum Note (Signed)
Addended by: Brandon Melnick on: 09/03/2019 02:04 PM   Modules accepted: Orders

## 2019-09-03 NOTE — Telephone Encounter (Signed)
I called husband and he is requesting to go back to 9.4 mg patch exelon, feels like this helped more with her confusion even though no change with fatigue when decreased.  Is this ok?  [facility] [address]

## 2019-09-03 NOTE — Telephone Encounter (Signed)
I called and no answer.  Prescription was done.

## 2019-09-03 NOTE — ED Triage Notes (Signed)
Per EMS, patient from [facility], c/o abdominal pain with diarrhea today. Staff reports giving prune juice and laxative suppository this morning without constipation. Denies N/V. Hx parkinson's. Intermittent confusion, baseline per family.

## 2019-09-03 NOTE — ED Notes (Signed)
Patient has a urine culture in the main lab 

## 2019-09-03 NOTE — Telephone Encounter (Signed)
This is ok

## 2019-09-04 NOTE — Telephone Encounter (Signed)
That was mentioned as pt had issues and went to ED but not for [place] to contend with.

## 2019-09-04 NOTE — Telephone Encounter (Signed)
Patient should see PCP if diarrhea persists.

## 2019-09-04 NOTE — Telephone Encounter (Signed)
I called husband and relayed that increased dose call to pharmacy of exelon patch.  He mentioned that she was in ED for diarrhea but they did not stay as was in 1400 to 2100 and not seen.  Having hard time walking will be in touch with dr Rexene Alberts when she returns.

## 2019-09-09 NOTE — Telephone Encounter (Signed)
If the diarrhea is related to the Exelon patch, it was recently reduced at the visit in July 2021.  I would recommend we keep the Exelon the same and the patient discuss the possibility of seeing a GI specialist for chronic diarrhea, if PCP agrees with this recommendation.  I would recommend she follow-up with the primary care first.  Please relay information back to patient.

## 2019-09-17 ENCOUNTER — Telehealth: Payer: Self-pay | Admitting: Adult Health

## 2019-09-17 NOTE — Telephone Encounter (Signed)
I called husband.  Pt having issues with mobility, falls.  Husband concerned.  Pt now has alert button pendant to use if falls and needs help.  He is very concerned that she is going down hill.  Her diarrhea was an impaction, and has resolved and seen her pcp, Dr. Inda Merlin.  His concern is -mobility.  Made appt since so many calls 09-19-19 at 1500.  Will keep the 10/21 with Dr. Rexene Alberts.

## 2019-09-17 NOTE — Telephone Encounter (Signed)
Pt's husband, Tyniah Kastens (on [place]) called, Pt is deteriorating in a short period of time. Have started falling. Mr. Crockett would like a call from the nurse.

## 2019-09-19 ENCOUNTER — Telehealth: Payer: Self-pay | Admitting: Adult Health

## 2019-09-19 ENCOUNTER — Ambulatory Visit: Payer: Self-pay | Admitting: Adult Health

## 2019-09-19 NOTE — Telephone Encounter (Signed)
I called husband,  He felt so bad about cancelling appt but was so dizziness, afib issues with him, he is better but not fully.  Was so sorry to cancel the appt.  I will look out for another one with MM/NP in afternoon if cancellation.  Appt kept with Dr. Rexene Alberts.

## 2019-09-19 NOTE — Telephone Encounter (Signed)
Pt's husband called needing to cancel appt due to him not feeling well and would like to discuss with RN. Husband was very apologetic about having to cancel appt. Please call [phone] or [phone] when available.

## 2019-10-28 ENCOUNTER — Encounter: Payer: Self-pay | Admitting: Neurology

## 2019-10-28 ENCOUNTER — Ambulatory Visit (INDEPENDENT_AMBULATORY_CARE_PROVIDER_SITE_OTHER): Payer: BC Managed Care – PPO | Admitting: Neurology

## 2019-10-28 VITALS — BP 121/81 | HR 86 | Ht 63.0 in | Wt 128.5 lb

## 2019-10-28 DIAGNOSIS — G20 Parkinson's disease: Secondary | ICD-10-CM

## 2019-10-28 NOTE — Progress Notes (Signed)
Subjective:    Patient ID: Michelle Sims is a 86 y.o. female.  HPI     Interim history:   Michelle Sims is an 86 year-old right-handed woman with an underlying medical history of chronic back pain, hypertension, thyroid cancer, breast cancer, status post thyroidectomy, tonsillectomy, appendectomy, tubal ligation and mastectomy with reconstruction, who presents for followup consultation of her parkinsonism, probable right-sided predominant Parkinson's disease of the akinetic-rigid type. She is accompanied by her husband again today. I last saw her on 09/04/2017, at which time she complained of fatigue and foggy headedness.  She had had several appointments with [name].  She was on Exelon patch for memory loss but had trouble tolerating it.  She was on Sinemet 1-1/2 pills alternating with 1 pill for 4 doses and Sinemet CR at night.  She had stopped driving.  She saw Vaughan Browner, nurse practitioner in the interim on 11/08/2017 at which time she was on Sinemet 1-1/2 pills 4 times a day.  She started seeing Dr. Lezlie Octave at [facility] neurology.  She tried Remeron for sleep.  She was started on Namenda for memory loss.  She has been seeing Dr. Mervyn Skeeters and has a follow-up appointment in November.  She saw Vaughan Browner on 05/01/2019 as well as 07/25/2019.  Today, 10/28/2019: She reports feeling more fatigued.  She has to take more than 1 nap on an average day.  She continues to take her Sinemet 4 times a day and Sinemet CR at bedtime.  She also takes Exelon patch.  She tried Remeron for sleep but could not tolerated.  Her husband provides additional information and reports that she has not been able to exercise very much but she did benefit from [facility].  She does not like to drink water very much due to having to urinate frequently.  He would like for her to drink more water, she averages 1 or 2 bottles per day.  She has not fallen thankfully.  She does use her 2 wheeled  walker inside and outside.  The patient's allergies, current medications, family history, past medical history, past social history, past surgical history and problem list were reviewed and updated as appropriate.    Previously (copied from all previous notes for reference):  I saw her on 02/22/2016, at which time she was primarily complaining about fatigue. She was not on Sinemet CR. She was unable to tolerate Sinemet 1-1/2 pills 4 times a day. She was also at times confused and sleepy during the day. She was advised to continue with Sinemet 1-1/2 pills alternating with one pill for a total of 5 per day. She was advised to restart Sinemet CR at bedtime.     I saw her on 10/14/2015, at which time she reported doing fairly well but increasing the Sinemet to 2 pills for the first dose felt too much and she reduced her back to within a half pills. She was participating in boxing for Parkinson's disease. She has more complaints about confusion, thankfully no falls, she was alternating 1-1/2 pills of Sinemet with one pill for a total of 4 doses. I asked her to try to increase all 4 doses to 1-1/2 pills. However, she called back on 11/11/2015 reporting that she did not feel right with this dose and she was advised to reduce it back to 1-1/2 pills alternating with one pill rather than 1-1/2 pills 4 times a day.    I saw her on 05/25/2015, at which  time she reported not feeling too well overall, reported more fatigue, more slowness, more cognitive issues, felt less confident with walking. She reported no recent falls, she had no choking episodes. She was not drinking enough fluid she admitted. She had some hip pain bilaterally as well. I suggested a cautious increase in her Sinemet to 2 pills for the first dose then 1 for the next 3 doses for the day. We talked about coenzyme Q10 supplementation. She was encouraged to drink more water and exercise regularly.     I saw her on 11/24/2014, at which time she  reported that she was trying to continue with water aerobics and her walking exercises. She did not like the spinning class at the [facility]. She had some visual illusions but no frank hallucinations. She has not fallen but was fearful of falling. She had some dream enactments. These were few and far between. She was on a small dose of clonazepam in the past and took 0.5 mg for years. She felt that her memory was worse mood was stable. Overall, she felt that she had not progressed very much but she did for about the future. I suggested she continue with Sinemet 1 pill 4 times a day.   I saw her on 07/22/2014, at which time she reported a recent fall about a week prior at the hairdresser's. She bruised her knees. She was not using a cane at the time. Her husband noted that she would probably do better using a cane. She had blood work with her primary care physician which per husband was mostly fine. She was not drinking enough water. She was complaining of fatigue. She was not always sleeping well. She was not always rested. She had not tried melatonin yet. I suggested she try to exercise regularly. For sleep, I suggested she try melatonin. I asked her to drink more water and add more fiber to her diet. [facility].    I saw her on 04/17/2014, at which time she reported more freezing and start hesitation. She had not followed recently. She had more fatigue. She was not drinking enough water. She was exercising regularly she was having bilateral hip pain. I suggested we increase her Sinemet to 4 times a day, at 7, 11, 3 PM and 8 PM.   In the interim, she was seen by Dr. Oneida Alar in sports medicine for left-sided piriformis syndrome. I reviewed his office notes from 05/27/2014 as well as 07/02/2014. Her Cymbalta was increased.   I saw her on 01/14/2014, at which time she reported doing well after her right total knee replacement in September 2015 and  she was in physical therapy through her orthopedics office. She was on Sinemet 3 times a day. She did notice that her posture was worse. Sometimes she had confusion. I did not make medication changes at the time. She presents for a sooner than scheduled appointment secondary to more fatigue and balance issues.    I saw her on 07/30/2013, at which time she felt her PD symptoms were stable. She was taking Sinemet 1 pill 3 times a day. She was on half a pill of clonazepam, 0.25 mg each night. She was on Cymbalta 30 mg daily per her psychiatrist, Dr. Caprice Beaver. She had finished her study at [facility]. I asked her to start using a cane for safety. I did not make any changes to her medications at the  time.    I first met her on 01/30/2013, at which time it felt her exam was stable. She had participated in a study at [facility] which included taking a DaT scan. She had been taking Sinemet 3 times a day but was taking it with her meals. The only thing I suggested at the time of her first visit was to take the Sinemet away from her mealtimes to ensure better absorption. In the interim, her husband called back stating that she had been experiencing some nausea. Her primary care physician reduced her Sinemet to half a pill 3 times a day. I talked to the patient in April and suggested that she continue with half a pill 3 times a day and try taking a whole pill 3 times a day if she noted an increase in her parkinsonian symptoms.    She previously followed with Dr. Morene Antu and was last seen by him on 12/06/2011, at which time he recommended a DaT scan and a trial of Sinemet to help differentiate her symptoms and help diagnose Parkinson's disease. She was advised that before she could have a DaT scan she would have to come off of Celexa. She did not end up having that test, as she enrolled in a study at [place]. She was supposed to followup after a couple of months, but did not. However, in the interim she started going to [employer] and got into a clinical trial for Parkinson's there. She was placed on Requip, then Sinemet. Requip caused severe sleepiness. She finished the study at [facility] under Dr. Maxine Glenn and as part of the study, she had 2 DaT scans. She did not take any study drugs. She reported severe R knee pain and had injections into her knee. She may need a knee replacement surgery, but is afraid. She goes to [employer] at [employer]. She lives at [place] with her husband in independent living and still drives. Her husband states she is a good driver. She has fatigue. She has seen Dr. Oneida Alar in sports medicine for her arthritis.     She was initially seen by Dr. Erling Cruz on 05/15/2002 for pain in her hips with evidence of lumbar spinal radiculopathy. She had degenerative disc disease. 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 She did well with conservative therapy. She has a long-standing history of back pain and was treated by Dr. Niel Hummer, who recommended she use amitriptyline. She has a over a 5 year history of deteriorating handwriting. Her balance has been declining as well. She has to hold onto the rail, going up and down steps. She has fallen infrequently. Her husband and her son started noticing that she shuffles her feet while walking. She started complaining of fatigue and lack of energy. She has remained fairly independent in her ADLs. She has problems with her memory. She denies a family history of tremor. She has long-standing loss of sense of smell. She does not have constipation or acting out dreams. She denies bowel or bladder incontinence. She has been seen by Dr. Tamala Julian for palpitations. CMP,CBC, TSH, T4, and B12 were normal in 2011 and 2012. She had been on doses of vitamin B6 200 to 100 mg per day. Her vitamin B6 level 08/13/2010 was greater than 100. Her B12 was 695. She discontinued B6. EMG/NCV  8./16/12 was normal except for lumbosacral deinnervation, compatible with lumbosacral radiculopathy. MRI  brain from 08/26/2010 showed chronic microvascular changes and mild atrophy. She has seen Dr. Casimiro Needle for depression and was changed from citalopram to sertraline 50 mg daily without any benefit and then started seeing Dr. Harley Alto at [facility]. She went off of tramadol because of the possibility of serotonergic syndrome, but restarted it. She has been in counseling. She denies numbness in her feet and legs. She describes involuntary movements. She has right knee pain. Basic metabolic panel normal 03/31/44. A course of physical therapy for her gait did not help.    Her Past Medical History Is Significant For: Past Medical History:  Diagnosis Date  . Anxiety   . Arthritis    knee & back   . Blood type, Rh negative   . Breast cancer ([place])   . Chronic fatigue   . Depression   . Diverticulosis   . Endocervical polyp   . GERD (gastroesophageal reflux disease)   . Hypertension   . Hypothyroidism   . IBS (irritable bowel syndrome)   . Insomnia   . Lymphedema   . Neuromuscular disorder ([place])    Parkinson's disease   . Osteopenia 06/2015   T score -2.4 distal third of the radius  . Parkinson disease ([facility])   . RBBB 10/07/2013  . Thyroid cancer [facility])     Her Past Surgical History Is Significant For: Past Surgical History:  Procedure Laterality Date  . APPENDECTOMY  1979  . BREAST SURGERY  1979   MASTECTOMY AND RECONSTRUCTION  . DILATION AND CURETTAGE OF UTERUS     MISCARRIAGE  . EYE SURGERY Bilateral    w/ IOL  . HYSTEROSCOPY  [id number]   AND D&C  . MASTECTOMY Bilateral   . THYROIDECTOMY    . TONSILLECTOMY    . TOTAL KNEE ARTHROPLASTY Right 10/07/2013   Procedure: RIGHT TOTAL KNEE ARTHROPLASTY;  Surgeon: Kerin Salen, MD;  Location: [facility];  Service: Orthopedics;  Laterality: Right;  . TUBAL LIGATION      Her Family History Is Significant For: Family History  Problem Relation  Age of Onset  . Hypertension Mother   . Hyperlipidemia Mother   . Heart failure Mother   . Hypertension Father   . Hyperlipidemia Father   . Heart failure Father   . Hyperlipidemia Brother   . Hypertension Brother   . Heart attack Brother   . Breast cancer Neg Hx     Her Social History Is Significant For: Social History   Socioeconomic History  . Marital status: Married    Spouse name: Not on file  . Number of children: Not on file  . Years of education: Not on file  . Highest education level: Not on file  Occupational History  . Occupation: retired    Comment: "odd jobs"  Tobacco Use  . Smoking status: Never Smoker  . Smokeless tobacco: Never Used  Vaping Use  . Vaping Use: Never used  Substance and Sexual Activity  . Alcohol use: Yes    Alcohol/week: 0.0 standard drinks    Comment: rare  . Drug use: No  . Sexual activity: Never    Birth control/protection: Surgical, Post-menopausal    Comment: 1st intercourse 86 yo-Fewer than 5 partners  Other Topics Concern  . Not on file  Social History Narrative  . Not on file   Social Determinants of Health   Financial Resource Strain:   . Difficulty of Paying Living Expenses: Not on file  Food Insecurity:   . Worried About Running  Out of Food in the Last Year: Not on file  . Ran Out of Food in the Last Year: Not on file  Transportation Needs:   . Lack of Transportation (Medical): Not on file  . Lack of Transportation (Non-Medical): Not on file  Physical Activity:   . Days of Exercise per Week: Not on file  . Minutes of Exercise per Session: Not on file  Stress:   . Feeling of Stress : Not on file  Social Connections:   . Frequency of Communication with Friends and Family: Not on file  . Frequency of Social Gatherings with Friends and Family: Not on file  . Attends Religious Services: Not on file  . Active Member of Clubs or Organizations: Not on file  . Attends Archivist Meetings: Not on file  .  Marital Status: Not on file    Her Allergies Are:  Allergies  Allergen Reactions  . Sulfa Antibiotics Rash  . Biaxin [Clarithromycin] Other (See Comments)    Pt does not remember reaction   . Ciprofloxacin Nausea And Vomiting  . Hctz [Hydrochlorothiazide] Other (See Comments)    weakness and hyponatremia  . Macrobid [Nitrofurantoin] Nausea Only  . Other Nausea Only  . Prednisone Other (See Comments)    Swelling and facial redness   . Sulfamethoxazole-Trimethoprim Other (See Comments) and Rash    Sore throat  :   Her Current Medications Are:  Outpatient Encounter Medications as of 10/28/2019  Medication Sig  . amLODipine (NORVASC) 2.5 MG tablet Take 2.5 mg by mouth daily.  . Ascorbic Acid (VITAMIN C PO) Take by mouth.  Marland Kitchen aspirin EC 81 MG tablet Take 81 mg by mouth daily.  . Calcium Carb-Cholecalciferol (CALCIUM 600 + D PO) Take 1 tablet by mouth daily with lunch.  . carbidopa-levodopa (SINEMET IR) 25-100 MG tablet TAKE 1 & 1/2 TAB IN THE AM, 1 TAB MID AM, 1 & 1/2 TAB IN THE AFTERNOON, 1 TAB IN THE EVENING  . Carbidopa-Levodopa ER (SINEMET CR) 25-100 MG tablet controlled release TAKE 1 TABLET BY MOUTH EVERYDAY AT BEDTIME  . cholecalciferol (VITAMIN D) 1000 units tablet Take 2,000 Units by mouth daily.  Marland Kitchen co-enzyme Q-10 30 MG capsule Take 30 mg by mouth 3 (three) times daily.  . Cranberry (SM CRANBERRY) 300 MG tablet Take 300 mg by mouth daily.  . fosfomycin (MONUROL) 3 g PACK Mix in 4 ounces of water and Administer immediately  . irbesartan (AVAPRO) 75 MG tablet Take 37.5 mg by mouth at bedtime.   Marland Kitchen levothyroxine (SYNTHROID, LEVOTHROID) 112 MCG tablet Take 112 mcg by mouth daily before breakfast.  . MYRBETRIQ 25 MG TB24 tablet Take 25 mg by mouth daily.  . NONFORMULARY OR COMPOUNDED ITEM Estradiol vag cream 0.02% (0.2m/ml)  S:  Insert 1 ml (1 syringe) into the vagina hs 2 times weekly as directed.  . nystatin-triamcinolone ointment (MYCOLOG) Apply 1 application topically 2 (two)  times daily.  . raloxifene (EVISTA) 60 MG tablet TAKE 1 TABLET (60 MG TOTAL) BY MOUTH DAILY.  . rivastigmine (EXELON) 9.5 mg/24hr Place 1 patch (9.5 mg total) onto the skin daily.   No facility-administered encounter medications on file as of 10/28/2019.  :  Review of Systems:  Out of a complete 14 point review of systems, all are reviewed and negative with the exception of these symptoms as listed below:      Review of Systems  Neurological:       RM 2 with husband. They live at  [facility]. Last seen 07/25/2019. Here for follow up on Parkinsonism. Takes sinemet/exelon.     Objective:  Neurological Exam  Physical Exam Physical Examination:   Vitals:   10/28/19 1446  BP: 121/81  Pulse: 86  SpO2: 96%    General Examination: The patient is a very pleasant 86 y.o. female in no acute distress. She appears well-developed and well-nourished and well groomed.   HEENT:Normocephalic, atraumatic, pupils are equal, round and reactive to light, mild saccadic breakdown on extraocular testing, she has mild limitation to upper gaze. She has a mildly decreased eye blink rate. Hearing is mildly impaired, face is symmetric with mild facial masking noted, normal facial sensation. She has mild to moderate nuchal rigidity, decreased active and passive range of motion. Oropharynx exam reveals mild to moderate mouth dryness, tongue protrudes centrally and palate elevates symmetrically, she has no evidence of drooling, left shoulder slightly higher than right, not new.   Chest:is clear to auscultation without wheezing or crackles noted.  Heart:sounds areregular without murmurs, rubs or gallops noted.   Abdomen:is soft, non-tender and non-distended with normal bowel sounds appreciated on auscultation.: Changes and updated.  Extremities:There is no pitting edema in the distal lower extremities bilaterally.   Skin: is warm and dry with no trophic changes noted. Age-related changes are  noted on the skin. Shewears anelastic sleeve on the right arm for compression.   Musculoskeletal: exam reveals no obvious joint deformities, tenderness, joint swelling or erythema.Mild arthritic changes in her hands.   Neurologically:  Mental status: The patient is awake and alert, paying good attention. She is able to provide the history. Her husband provides more details.Mild bradyphrenia. Speech is mild to moderately hypophonic with no dysarthria noted. Mood is congruent and affect isnormal.  Cranial nerves are as described above under HEENT exam.   Motor exam: Normal bulk, and strength for age is noted.Strength and tone are same as last time, very mild rigidity noted in the right upper extremity, minimal cogwheeling noted, overall mild to moderate bradykinesia, no dyskinesias noted. She has no drift or rebound, she has no significant tremor, Romberg is not testable safely. She has intact sensation in the upper and lower extremities, to light touch.  Fine motor skills are mild to moderately impaired, right perhaps worse than left. Cerebellar testing shows no additional signs of dysmetria or intention tremor, no ataxia.  Gait, station and balance:she stands up with difficulty, has to push herself up, requires no help, posture is moderately stooped, but appears stable. No telltale shuffling, but does not pick up her right foot as well. Balance is impaired. She has a 2 wheeled walker.   Assessment and Plan:   In summary, RIDDHI GRETHER is an 86 year old femalewithan underlying medical history of chronic back pain, hypertension, thyroid cancer, breast cancer, status post thyroidectomy, tonsillectomy, appendectomy, tubal ligation and mastectomy with reconstruction, whopresents for follow-up consultation of her parkinsonism, with some right-sided lateralization, symptoms dating back to several years ago. She has probable right-sided predominant Parkinson's disease, no tremor  predominance.  She has experienced mild progression over time.  She had side effects when she was on Sinemet 1-1/2 pills 4 times a day.  She is currently on 1 pill 4 times a day with a Sinemet CR at bedtime.  She has had some memory loss and is on Exelon patch. She has seen [name] in the past and Dr. Mervyn Skeeters at [facility] in the recent past. She continues to tolerate Sinemet immediate release and long-acting fairly well  at the current dose.  She has been struggling with fatigue.  She does not always hydrate very well and is again encouraged to increase her water intake.  She has had some benefit from physical therapy but is not very active physically per husband's report.  She is encouraged to try to walk on a regular basis.  She is advised to continue with her current medication regimen and follow-up with Ward Givens, nurse practitioner in 3 to 4 months.  I answered all the questions today and the patient and her husband were in agreement.  I spent 25 minutes in total face-to-face time and in reviewing records during pre-charting, more than 50% of which was spent in counseling and coordination of care, reviewing test results, reviewing medications and treatment regimen and/or in discussing or reviewing the diagnosis of parkinsonism, the prognosis and treatment options. Pertinent laboratory and imaging test results that were available during this visit with the patient were reviewed by me and considered in my medical decision making (see chart for details).

## 2019-10-28 NOTE — Patient Instructions (Signed)
Please continue with your Sinemet at the current dose.   We will have you follow up with Megan in 3-4 months.   Please continue with your daily exercises and healthy lifestyle.   Use your walker at all times.   Please try to drink more water.

## 2019-10-31 ENCOUNTER — Ambulatory Visit: Payer: BC Managed Care – PPO | Admitting: Adult Health

## 2020-01-07 ENCOUNTER — Other Ambulatory Visit: Payer: Self-pay

## 2020-01-07 ENCOUNTER — Ambulatory Visit
Admission: RE | Admit: 2020-01-07 | Discharge: 2020-01-07 | Disposition: A | Discharge status: Home / Self Care | Payer: BC Managed Care – PPO | Source: Ambulatory Visit | Attending: Sports Medicine | Admitting: Sports Medicine

## 2020-01-07 ENCOUNTER — Ambulatory Visit (INDEPENDENT_AMBULATORY_CARE_PROVIDER_SITE_OTHER): Payer: BC Managed Care – PPO | Admitting: Sports Medicine

## 2020-01-07 ENCOUNTER — Other Ambulatory Visit: Payer: Self-pay | Admitting: Sports Medicine

## 2020-01-07 VITALS — BP 136/81 | Ht 63.0 in | Wt 128.0 lb

## 2020-01-07 DIAGNOSIS — M549 Dorsalgia, unspecified: Secondary | ICD-10-CM

## 2020-01-07 DIAGNOSIS — M5137 Other intervertebral disc degeneration, lumbosacral region: Secondary | ICD-10-CM

## 2020-01-07 NOTE — Progress Notes (Signed)
CC; Thoracic area back pain/ 86 YO F  Pain for many years in thoracic area Worse recently with sharp pain at T10 area This radiates along rib cage to left  Aggravated by bending over to wash dishes or fold clothes Relieved by lying back flat and then goes away in seconds  No fall or injury  Past HX Parkinson's DDD DJD  ROS Felt better before pandemic when she could do pool exercises Tried chair exercise but too weak to get out of chair without arms  PE Pleasant older F in NAD BP 136/81   Ht 5\' 3"  (1.6 m)   Wt 128 lb (58.1 kg)   BMI 22.67 kg/m    Thoracic kyphosis No real TTP along thoracic spine Pressure at T 10 area causes mild pain Movement of arms does not worsen Back extension relieves this pain  Back XR Several levels of DDD and particularly noted at T10 and L1/2 Compared to 2012 and 2016 there has been some progression ( I called and reviewed with patient)

## 2020-01-07 NOTE — Assessment & Plan Note (Signed)
XR today is slightly worse than prior No compression fracture DDD likely triggers radicular pain when she leans trunk forward  Avoid positions that trigger pain Try an back extension T stretch Topical NSAIDs Ice or heat for comfort  See how this does over 1 month

## 2020-01-07 NOTE — Patient Instructions (Signed)
It was great to see you today!  -Please get some voltaren gel and rub it on your low back around the level of T10 two to three times a day. -Do the T stretch we showed you 3 times a day  Call us with any questions!

## 2020-01-31 ENCOUNTER — Encounter: Payer: Self-pay | Admitting: Physical Therapy

## 2020-01-31 ENCOUNTER — Telehealth: Payer: Self-pay | Admitting: Physical Therapy

## 2020-01-31 NOTE — Therapy (Signed)
[facility] [address] [place], [place], [address] Phone: [phone]   Fax:  [phone]  Patient Details  Name: LATITIA HOUSEWRIGHT MRN: [id number] Date of Birth: [birth year] Referring Provider:  No ref. provider found  Encounter Date: 01/31/2020  Discharge Summary  PHYSICAL THERAPY DISCHARGE SUMMARY  Visits from Start of Care: 3  Current functional level related to goals / functional outcomes: Unable to assess goals - pt did not return back to PT.     Plan: Patient agrees to discharge.  Patient goals were not met. Patient is being discharged due to not returning since the last visit.  ?????       Arliss Journey , PT, DPT  01/31/2020, 1:44 PM  [facility] [address] [place], [place], [address] Phone: [phone]   Fax:  [phone]

## 2020-02-01 ENCOUNTER — Other Ambulatory Visit: Payer: Self-pay | Admitting: Nurse Practitioner

## 2020-02-19 ENCOUNTER — Telehealth: Payer: Self-pay | Admitting: Adult Health

## 2020-02-19 NOTE — Telephone Encounter (Signed)
Spoke with pt's husband to cancel upcoming appointment on 01/31 per NP's request. He stated that pt is having a very hard time and "deteriorating" and really needs to see [place] sooner than 02/21 appointment. Please advise if there are any cancellations.

## 2020-02-19 NOTE — Telephone Encounter (Signed)
Noted  

## 2020-02-19 NOTE — Telephone Encounter (Signed)
I called the pt's husband and offered an appt for 02/20/2020 at 230 pm with Dr. Rexene Alberts but pt has a prior appt scheduled. I will monitor the schedule and see if we can get the pt worked in sooner.

## 2020-02-24 ENCOUNTER — Ambulatory Visit: Payer: BC Managed Care – PPO | Admitting: Adult Health

## 2020-02-26 ENCOUNTER — Ambulatory Visit (INDEPENDENT_AMBULATORY_CARE_PROVIDER_SITE_OTHER): Payer: BC Managed Care – PPO | Admitting: Neurology

## 2020-02-26 ENCOUNTER — Other Ambulatory Visit: Payer: Self-pay

## 2020-02-26 ENCOUNTER — Encounter: Payer: Self-pay | Admitting: Neurology

## 2020-02-26 VITALS — BP 112/64 | HR 65 | Wt 129.3 lb

## 2020-02-26 DIAGNOSIS — G20 Parkinson's disease: Secondary | ICD-10-CM | POA: Diagnosis not present

## 2020-02-26 DIAGNOSIS — R413 Other amnesia: Secondary | ICD-10-CM

## 2020-02-26 DIAGNOSIS — R5383 Other fatigue: Secondary | ICD-10-CM

## 2020-02-26 MED ORDER — RIVASTIGMINE 13.3 MG/24HR TD PT24: 13.3000 mg | MEDICATED_PATCH | Freq: Every day | TRANSDERMAL | 3 refills | Status: DC

## 2020-02-26 NOTE — Patient Instructions (Signed)
It was nice to see you again today.  As discussed, we will try to increase your memory medication, the Exelon patch to the next dose: 13.3 mg daily.  I have changed your prescription and [facility].  If you have any side effects with that we can certainly consider going back to the 9.5 mg strength.  Let us know.  It may take several days for you to notice a change, if you have no side effects and seemed to tolerate the new strength well, please continue with the new dose.  Please continue with your Sinemet 1 pill 4 times a day and the long-acting Sinemet 1 time at bedtime.  Please follow-up in 4 months to see Ward Givens, nurse practitioner.  Try to use your walker at all times and stay better hydrated with water, 6 to 8 cups of water per day are recommended, generally speaking, 8 ounce size each.

## 2020-02-26 NOTE — Progress Notes (Signed)
Subjective:    Patient ID: Michelle Sims is a 87 y.o. female.  HPI     Interim history:   Michelle Sims is an 87 year old right-handed woman with an underlying medical history of chronic back pain, hypertension, thyroid cancer, breast cancer, status post thyroidectomy, tonsillectomy, appendectomy, tubal ligation and mastectomy with reconstruction, who presents for followup consultation of her parkinsonism. She is accompanied by her husband again today. I last saw her on 10/28/2019, At which time her main complaint was fatigue.  She needed to take a nap every day.  She was on Sinemet 1 pill 4 times a day and Sinemet CR at bedtime.  She was on Exelon patch and tried Remeron at night for sleep but could not tolerate it.  She was not able to exercise very much.  She had had some physical therapy at her retirement community.  She was not hydrating very well.  She had no recent falls.  She was advised to continue with her medication regimen.  She had a follow-up scheduled with Dr. Mervyn Skeeters at [facility] in [place].  Today, 02/26/2020: She reports feeling more off balance, she has fallen, thankfully without major injuries, she typically falls when she does not use her walker.  She has a 2 wheeled walker and does not always use it inside the house.  She has ongoing issues with fatigue.  Her husband is concerned about more forgetfulness.  He is wondering if she can increase the Exelon patch.  She is currently on 9.5 mg daily and is able to tolerate this.  Appetite is good, he tries to encourage her to drink more water.  She may not always do well enough.  She does struggle with constipation from time to time and uses MiraLAX as needed.  She also tries to eat prunes for more natural approach to constipation management.  She has been followed by Dr. Mervyn Skeeters at [facility] in [place].  She also had a consultation via telemedicine with Dr. Ida Rogue for cognitive evaluation and I reviewed the telemedicine visit note from  02/20/2020.    The patient's allergies, current medications, family history, past medical history, past social history, past surgical history and problem list were reviewed and updated as appropriate.    Previously (copied from all previous notes for reference):   I saw her on 09/04/2017, at which time she complained of fatigue and foggy headedness.  She had had several appointments with [name].  She was on Exelon patch for memory loss but had trouble tolerating it.  She was on Sinemet 1-1/2 pills alternating with 1 pill for 4 doses and Sinemet CR at night.  She had stopped driving.   She saw Vaughan Browner, nurse practitioner in the interim on 11/08/2017 at which time she was on Sinemet 1-1/2 pills 4 times a day.   She started seeing Dr. Lezlie Octave at [facility] neurology.  She tried Remeron for sleep.  She was started on Namenda for memory loss.  She has been seeing Dr. Mervyn Skeeters and has a follow-up appointment in November.  She saw Vaughan Browner on 05/01/2019 as well as 07/25/2019.   I saw her on 02/22/2016, at which time she was primarily complaining about fatigue. She was not on Sinemet CR. She was unable to tolerate Sinemet 1-1/2 pills 4 times a day. She was also at times confused and sleepy during the day. She was advised to continue with Sinemet 1-1/2 pills alternating with one pill for a total of 5 per day.  She was advised to restart Sinemet CR at bedtime.     I saw her on 10/14/2015, at which time she reported doing fairly well but increasing the Sinemet to 2 pills for the first dose felt too much and she reduced her back to within a half pills. She was participating in boxing for Parkinson's disease. She has more complaints about confusion, thankfully no falls, she was alternating 1-1/2 pills of Sinemet with one pill for a total of 4 doses. I asked her to try to increase all 4 doses to 1-1/2 pills. However, she called back on 11/11/2015 reporting that she did not feel right with this dose and she was  advised to reduce it back to 1-1/2 pills alternating with one pill rather than 1-1/2 pills 4 times a day.    I saw her on 05/25/2015, at which time she reported not feeling too well overall, reported more fatigue, more slowness, more cognitive issues, felt less confident with walking. She reported no recent falls, she had no choking episodes. She was not drinking enough fluid she admitted. She had some hip pain bilaterally as well. I suggested a cautious increase in her Sinemet to 2 pills for the first dose then 1 for the next 3 doses for the day. We talked about coenzyme Q10 supplementation. She was encouraged to drink more water and exercise regularly.     I saw her on 11/24/2014, at which time she reported that she was trying to continue with water aerobics and her walking exercises. She did not like the spinning class at the [facility]. She had some visual illusions but no frank hallucinations. She has not fallen but was fearful of falling. She had some dream enactments. These were few and far between. She was on a small dose of clonazepam in the past and took 0.5 mg for years. She felt that her memory was worse mood was stable. Overall, she felt that she had not progressed very much but she did for about the future. I suggested she continue with Sinemet 1 pill 4 times a day.   I saw her on 07/22/2014, at which time she reported a recent fall about a week prior at the hairdresser's. She bruised her knees. She was not using a cane at the time. Her husband noted that she would probably do better using a cane. She had blood work with her primary care physician which per husband was mostly fine. She was not drinking enough water. She was complaining of fatigue. She was not always sleeping well. She was not always rested. She had not tried melatonin yet. I suggested she try to exercise regularly. For sleep, I suggested she try melatonin. I asked her to drink more water and add more fiber to her diet. [facility].    I saw her on 04/17/2014, at which time she reported more freezing and start hesitation. She had not followed recently. She had more fatigue. She was not drinking enough water. She was exercising regularly she was having bilateral hip pain. I suggested we increase her Sinemet to 4 times a day, at 7, 11, 3 PM and 8 PM.   In the interim, she was seen by Dr. Oneida Alar in sports medicine for left-sided piriformis syndrome. I reviewed his office notes from 05/27/2014 as well as 07/02/2014. Her Cymbalta was increased.   I saw her on 01/14/2014, at which time she reported  doing well after her right total knee replacement in September 2015 and she was in physical therapy through her orthopedics office. She was on Sinemet 3 times a day. She did notice that her posture was worse. Sometimes she had confusion. I did not make medication changes at the time. She presents for a sooner than scheduled appointment secondary to more fatigue and balance issues.    I saw her on 07/30/2013, at which time she felt her PD symptoms were stable. She was taking Sinemet 1 pill 3 times a day. She was on half a pill of clonazepam, 0.25 mg each night. She was on Cymbalta 30 mg daily per her psychiatrist, Dr. Caprice Beaver. She had finished her study at [facility]. I asked her to start using a cane for safety. I did not make any changes to her medications at the time.    I first met her on 01/30/2013, at which time it felt her exam was stable. She had participated in a study at [facility] which included taking a DaT scan. She had been taking Sinemet 3 times a day but was taking it with her meals. The only thing I suggested at the time of her first visit was to take the Sinemet away from her mealtimes to ensure better absorption. In the interim, her husband called back stating that she had been experiencing some nausea. Her primary care physician reduced her Sinemet to half a pill 3 times a  day. I talked to the patient in April and suggested that she continue with half a pill 3 times a day and try taking a whole pill 3 times a day if she noted an increase in her parkinsonian symptoms.    She previously followed with Dr. Morene Antu and was last seen by him on 12/06/2011, at which time he recommended a DaT scan and a trial of Sinemet to help differentiate her symptoms and help diagnose Parkinson's disease. She was advised that before she could have a DaT scan she would have to come off of Celexa. She did not end up having that test, as she enrolled in a study at [place]. She was supposed to followup after a couple of months, but did not. However, in the interim she started going to [employer] and got into a clinical trial for Parkinson's there. She was placed on Requip, then Sinemet. Requip caused severe sleepiness. She finished the study at [facility] under Dr. Maxine Glenn and as part of the study, she had 2 DaT scans. She did not take any study drugs. She reported severe R knee pain and had injections into her knee. She may need a knee replacement surgery, but is afraid. She goes to [employer] at [employer]. She lives at [place] with her husband in independent living and still drives. Her husband states she is a good driver. She has fatigue. She has seen Dr. Oneida Alar in sports medicine for her arthritis.     She was initially seen by Dr. Erling Cruz on 05/15/2002 for pain in her hips with evidence of lumbar spinal radiculopathy. She had degenerative disc disease. 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 She did well with conservative therapy. She has a long-standing history of back pain and was treated by Dr. Niel Hummer, who recommended she use amitriptyline. She has a over a 5 year history of deteriorating handwriting. Her balance has been declining as well. She has  to hold onto the rail, going up and down  steps. She has fallen infrequently. Her husband and her son started noticing that she shuffles her feet while walking. She started complaining of fatigue and lack of energy. She has remained fairly independent in her ADLs. She has problems with her memory. She denies a family history of tremor. She has long-standing loss of sense of smell. She does not have constipation or acting out dreams. She denies bowel or bladder incontinence. She has been seen by Dr. Tamala Julian for palpitations. CMP,CBC, TSH, T4, and B12 were normal in 2011 and 2012. She had been on doses of vitamin B6 200 to 100 mg per day. Her vitamin B6 level 08/13/2010 was greater than 100. Her B12 was 695. She discontinued B6. EMG/NCV 8./16/12 was normal except for lumbosacral deinnervation, compatible with lumbosacral radiculopathy. MRI brain from 08/26/2010 showed chronic microvascular changes and mild atrophy. She has seen Dr. Casimiro Needle for depression and was changed from citalopram to sertraline 50 mg daily without any benefit and then started seeing Dr. Harley Alto at [facility]. She went off of tramadol because of the possibility of serotonergic syndrome, but restarted it. She has been in counseling. She denies numbness in her feet and legs. She describes involuntary movements. She has right knee pain. Basic metabolic panel normal 05/02/80. A course of physical therapy for her gait did not help.    Her Past Medical History Is Significant For: Past Medical History:  Diagnosis Date  . Anxiety   . Arthritis    knee & back   . Blood type, Rh negative   . Breast cancer ([place])   . Chronic fatigue   . Depression   . Diverticulosis   . Endocervical polyp   . GERD (gastroesophageal reflux disease)   . Hypertension   . Hypothyroidism   . IBS (irritable bowel syndrome)   . Insomnia   . Lymphedema   . Neuromuscular disorder ([place])    Parkinson's disease   . Osteopenia 06/2015   T score -2.4 distal third of the radius  . Parkinson disease ([place])   .  RBBB 10/07/2013  . Thyroid cancer [facility])     Her Past Surgical History Is Significant For: Past Surgical History:  Procedure Laterality Date  . APPENDECTOMY  1979  . BREAST SURGERY  1979   MASTECTOMY AND RECONSTRUCTION  . DILATION AND CURETTAGE OF UTERUS     MISCARRIAGE  . EYE SURGERY Bilateral    w/ IOL  . HYSTEROSCOPY  [id number]   AND D&C  . MASTECTOMY Bilateral   . THYROIDECTOMY    . TONSILLECTOMY    . TOTAL KNEE ARTHROPLASTY Right 10/07/2013   Procedure: RIGHT TOTAL KNEE ARTHROPLASTY;  Surgeon: Kerin Salen, MD;  Location: [facility];  Service: Orthopedics;  Laterality: Right;  . TUBAL LIGATION      Her Family History Is Significant For: Family History  Problem Relation Age of Onset  . Hypertension Mother   . Hyperlipidemia Mother   . Heart failure Mother   . Hypertension Father   . Hyperlipidemia Father   . Heart failure Father   . Hyperlipidemia Brother   . Hypertension Brother   . Heart attack Brother   . Breast cancer Neg Hx     Her Social History Is Significant For: Social History   Socioeconomic History  . Marital status: Married    Spouse name: Not on file  . Number of children: Not on file  . Years of education: Not on  file  . Highest education level: Not on file  Occupational History  . Occupation: retired    Comment: "odd jobs"  Tobacco Use  . Smoking status: Never Smoker  . Smokeless tobacco: Never Used  Vaping Use  . Vaping Use: Never used  Substance and Sexual Activity  . Alcohol use: Yes    Alcohol/week: 0.0 standard drinks    Comment: rare  . Drug use: No  . Sexual activity: Never    Birth control/protection: Surgical, Post-menopausal    Comment: 1st intercourse 87 yo-Fewer than 5 partners  Other Topics Concern  . Not on file  Social History Narrative  . Not on file   Social Determinants of Health   Financial Resource Strain: Not on file  Food Insecurity: Not on file  Transportation Needs: Not on file  Physical Activity: Not on  file  Stress: Not on file  Social Connections: Not on file    Her Allergies Are:  Allergies  Allergen Reactions  . Sulfa Antibiotics Rash  . Biaxin [Clarithromycin] Other (See Comments)    Pt does not remember reaction   . Ciprofloxacin Nausea And Vomiting  . Hctz [Hydrochlorothiazide] Other (See Comments)    weakness and hyponatremia  . Macrobid [Nitrofurantoin] Nausea Only  . Other Nausea Only  . Prednisone Other (See Comments)    Swelling and facial redness   . Sulfamethoxazole-Trimethoprim Other (See Comments) and Rash    Sore throat  :   Her Current Medications Are:  Outpatient Encounter Medications as of 02/26/2020  Medication Sig  . amLODipine (NORVASC) 2.5 MG tablet Take 2.5 mg by mouth daily.  . Ascorbic Acid (VITAMIN C PO) Take by mouth.  Marland Kitchen aspirin EC 81 MG tablet Take 81 mg by mouth daily.  . Calcium Carb-Cholecalciferol (CALCIUM 600 + D PO) Take 1 tablet by mouth daily with lunch.  . carbidopa-levodopa (SINEMET IR) 25-100 MG tablet TAKE 1 & 1/2 TAB IN THE AM, 1 TAB MID AM, 1 & 1/2 TAB IN THE AFTERNOON, 1 TAB IN THE EVENING (Patient taking differently: TAKE 1 TAB IN THE AM, 1 TAB MID AM, 1 TAB IN THE AFTERNOON, 1 TAB IN THE EVENING)  . Carbidopa-Levodopa ER (SINEMET CR) 25-100 MG tablet controlled release TAKE 1 TABLET BY MOUTH EVERYDAY AT BEDTIME  . cholecalciferol (VITAMIN D) 1000 units tablet Take 2,000 Units by mouth daily.  Marland Kitchen co-enzyme Q-10 30 MG capsule Take 30 mg by mouth 3 (three) times daily.  . Cranberry 300 MG tablet Take 300 mg by mouth daily.  . fosfomycin (MONUROL) 3 g PACK Mix in 4 ounces of water and Administer immediately  . irbesartan (AVAPRO) 75 MG tablet Take 37.5 mg by mouth at bedtime.   Marland Kitchen levothyroxine (SYNTHROID, LEVOTHROID) 112 MCG tablet Take 112 mcg by mouth daily before breakfast.  . MYRBETRIQ 25 MG TB24 tablet Take 25 mg by mouth daily.  . NONFORMULARY OR COMPOUNDED ITEM Estradiol vag cream 0.02% (0.9m/ml)  S:  Insert 1 ml (1 syringe)  into the vagina hs 2 times weekly as directed.  . nystatin-triamcinolone ointment (MYCOLOG) Apply 1 application topically 2 (two) times daily.  . raloxifene (EVISTA) 60 MG tablet TAKE 1 TABLET BY MOUTH EVERY DAY  . rivastigmine (EXELON) 13.3 MG/24HR Place 1 patch (13.3 mg total) onto the skin daily.  . [DISCONTINUED] rivastigmine (EXELON) 9.5 mg/24hr Place 1 patch (9.5 mg total) onto the skin daily.   No facility-administered encounter medications on file as of 02/26/2020.  :  Review of Systems:  Out of a complete 14 point review of systems, all are reviewed and negative with the exception of these symptoms as listed below:  Review of Systems  Neurological:       Here f/u on PD- husband and wife have multiple questions.    Objective:  Neurological Exam  Physical Exam Physical Examination:   Vitals:   02/26/20 1401  BP: 112/64  Pulse: 65  SpO2: 97%    General Examination: The patient is a very pleasant 87 y.o. female in no acute distress. She appears somewhat frail and deconditioned. Well groomed.   HEENT:Normocephalic, atraumatic, pupils are equal, round and reactive to light, mild saccadic breakdown on extraocular testing, she has mild limitation to upper gaze. She has a mildly decreased eye blink rate. Hearing is mildly impaired, face is symmetric with mild facial masking noted, normal facial sensation. She has moderate nuchal rigidity, decreased activeand passiverange of motion. Oropharynx exam reveals mild to moderate mouth dryness, tongue protrudes centrally and palate elevates symmetrically, she has no evidence of drooling, left shoulder slightly higher than right, not new.   Chest:is clear to auscultation without wheezing or crackles noted.  Heart:sounds areregular without murmurs, rubs or gallops noted.   Abdomen:is soft, non-tender and non-distended with normal bowel sounds appreciated on auscultation.: Changes and updated.  Extremities:There is no pitting  edema in the distal lower extremities bilaterally.   Skin: is warm and dry with no trophic changes noted. Age-related changes are noted on the skin. Shewears anelastic sleeve on therightarm for compression.   Musculoskeletal: exam reveals no obvious joint deformities, tenderness, joint swelling or erythema.Mild arthritic changes in her hands.   Neurologically:  Mental status: The patient is awake and alert, paying good attention. She is able to provide some of her history but her husband provides more details.  Memory is mildly impaired.  Mild bradyphrenia. Speech is mild to moderately hypophonic with no dysarthria noted. Mood is congruent and affect isnormal.  Cranial nerves are as described above under HEENT exam.   Motor exam: Normal bulk, and strength forThin bulk, global strength of 4+ out of 5, tone is mildly increased in the right more than left upper extremity, no obvious cogwheeling noted today, no resting tremor.  She has moderate bradykinesia, no dyskinesias noted.  No drift or rebound, Romberg was not tested for safety concerns.  Sensory exam is intact to light touch in the upper and lower extremities.  Fine motor skills are mild to moderately impaired, right seems slightly worse than left. Cerebellar testing shows no additional signs of dysmetria or intention tremor, no ataxia.  Gait, station and balance:she stands up with difficulty, has to push herself up, requires no help, posture is moderately stooped, but appears stable. Notelltaleshuffling, but does not pick up her right foot as well. Balance is impaired.  Some trouble turning.  Overall walks slowly, with a 2 wheeled walker.   Assessment and Plan:   In summary, Michelle Sims is an 87 year old femalewithan underlying medical history of chronic back pain, hypertension, thyroid cancer, breast cancer, status post thyroidectomy, tonsillectomy, appendectomy, tubal ligation and mastectomy with reconstruction,  whopresents for follow-up consultation of her parkinsonism, withsomeright-sided lateralization, symptoms dating back to several years ago.  She may have akinetic-rigid type Parkinson's disease but certainly has not had all the telltale signs of idiopathic Parkinson's disease, not impossible that she has atypical parkinsonism or vascular parkinsonism.  She has experienced progression over time.  She had side effects when she was on Sinemet  1-1/2 pills 4 times a day. She is currently on 1 pill 4 times a day with a Sinemet CR at bedtime.  She has had some memory loss and is on Exelon patch.  We mutually agreed to increase it to the neck strength of 13.3 mg daily.  She has had consultations with other neurologists over time, she saw [name] and then started seeing Dr. Mervyn Skeeters at [facility].  She also had a consultation with a cognitive specialist at [facility].  She continues to tolerate Sinemet immediate release and long-acting fairly well at the current dose.  She has been struggling with fatigue.  She does not always hydrate very well and is again encouraged to increase her water intake.  She has had some benefit from physical therapy.  She has had some falls.  She is reminded to be consistent with her walker and use it at all times.  I provided new instructions and a new prescription for the Exelon patch 13.3 mg daily.  If she has any side effects from the increased dose, we can certainly revert back to the 9.5 mg strength.  She is advised to continue with her other medications.  She is advised to follow-up with Ward Givens, nurse practitioner in 4 months.  I answered all the questions today and the patient and her husband were in agreement.

## 2020-03-16 ENCOUNTER — Ambulatory Visit: Payer: BC Managed Care – PPO | Admitting: Neurology

## 2020-03-20 ENCOUNTER — Ambulatory Visit
Admission: RE | Admit: 2020-03-20 | Discharge: 2020-03-20 | Disposition: A | Discharge status: Home / Self Care | Payer: BC Managed Care – PPO | Source: Ambulatory Visit | Attending: Internal Medicine | Admitting: Internal Medicine

## 2020-03-20 ENCOUNTER — Other Ambulatory Visit: Payer: Self-pay

## 2020-03-20 ENCOUNTER — Other Ambulatory Visit: Payer: Self-pay | Admitting: Internal Medicine

## 2020-03-20 DIAGNOSIS — R1032 Left lower quadrant pain: Secondary | ICD-10-CM

## 2020-04-02 ENCOUNTER — Institutional Professional Consult (permissible substitution): Payer: Medicare Other | Admitting: Neurology

## 2020-05-26 ENCOUNTER — Ambulatory Visit (INDEPENDENT_AMBULATORY_CARE_PROVIDER_SITE_OTHER): Payer: BC Managed Care – PPO | Admitting: Adult Health

## 2020-05-26 ENCOUNTER — Other Ambulatory Visit: Payer: Self-pay

## 2020-05-26 VITALS — BP 141/86 | HR 82 | Ht 63.0 in | Wt 124.0 lb

## 2020-05-26 DIAGNOSIS — R413 Other amnesia: Secondary | ICD-10-CM

## 2020-05-26 DIAGNOSIS — G20 Parkinson's disease: Secondary | ICD-10-CM

## 2020-05-26 MED ORDER — RIVASTIGMINE 9.5 MG/24HR TD PT24: 9.5000 mg | MEDICATED_PATCH | Freq: Every day | TRANSDERMAL | 5 refills | Status: DC

## 2020-05-26 NOTE — Patient Instructions (Addendum)
Your Plan:  --Continue Sinemet 25-100 mg 4 times a day -- Continue Sinemet CR 25-100 mg at bedtime -- Continue Exelon 9.5 mg If your symptoms worsen or you develop new symptoms please let us know.    Thank you for coming to see [place] at [facility]. I hope we have been able to provide you high quality care today.  You may receive a patient satisfaction survey over the next few weeks. We would appreciate your feedback and comments so that we may continue to improve ourselves and the health of our patients.

## 2020-05-26 NOTE — Progress Notes (Addendum)
PATIENT: Michelle Sims DOB: 1929/04/16  REASON FOR VISIT: follow up HISTORY FROM: patient  HISTORY OF PRESENT ILLNESS: Today 05/26/20:   [name] is an 87 year old female with a history of parkinsonism.  She returns today for follow-up.  She reports that her tremor is only on occasion.  She uses a walker when ambulating.  Denies any recent falls.  Reports that her gait is slow but no other changes.  Reports good appetite.  States that she naps a lot during the day subsequently does not sleep well at night.  She tried increasing Exelon to 13.3 mg but this caused nausea.  She is now back on 9.5 mg.  She continues Sinemet 1 tablet 4 times a day and Sinemet CR at bedtime.  The patient does feel that she is depressed.  Reports most days she has no desire to do activities.  She stays in bed a lot.  Has not discussed her depression with her PCP.  She has a second opinion regarding her parkinsonism June 1 at [facility].  Patient's husband states that they are not working to change practices just want a second opinion.  HISTORY 02/26/2020: She reports feeling more off balance, she has fallen, thankfully without major injuries, she typically falls when she does not use her walker.  She has a 2 wheeled walker and does not always use it inside the house.  She has ongoing issues with fatigue.  Her husband is concerned about more forgetfulness.  He is wondering if she can increase the Exelon patch.  She is currently on 9.5 mg daily and is able to tolerate this.  Appetite is good, he tries to encourage her to drink more water.  She may not always do well enough.  She does struggle with constipation from time to time and uses MiraLAX as needed.  She also tries to eat prunes for more natural approach to constipation management.  She has been followed by Dr. Mervyn Skeeters at [facility] in [place].  She also had a consultation via telemedicine with Dr. Ida Rogue for cognitive evaluation and I reviewed the telemedicine visit  note from 02/20/2020.   REVIEW OF SYSTEMS: Out of a complete 14 system review of symptoms, the patient complains only of the following symptoms, and all other reviewed systems are negative.  ALLERGIES: Allergies  Allergen Reactions  . Sulfa Antibiotics Rash  . Biaxin [Clarithromycin] Other (See Comments)    Pt does not remember reaction   . Ciprofloxacin Nausea And Vomiting  . Hctz [Hydrochlorothiazide] Other (See Comments)    weakness and hyponatremia  . Macrobid [Nitrofurantoin] Nausea Only  . Other Nausea Only  . Prednisone Other (See Comments)    Swelling and facial redness   . Sulfamethoxazole-Trimethoprim Other (See Comments) and Rash    Sore throat    HOME MEDICATIONS: Outpatient Medications Prior to Visit  Medication Sig Dispense Refill  . amLODipine (NORVASC) 2.5 MG tablet Take 2.5 mg by mouth daily.    . Ascorbic Acid (VITAMIN C PO) Take by mouth.    Marland Kitchen aspirin EC 81 MG tablet Take 81 mg by mouth daily.    . Calcium Carb-Cholecalciferol (CALCIUM 600 + D PO) Take 1 tablet by mouth daily with lunch.    . carbidopa-levodopa (SINEMET IR) 25-100 MG tablet TAKE 1 & 1/2 TAB IN THE AM, 1 TAB MID AM, 1 & 1/2 TAB IN THE AFTERNOON, 1 TAB IN THE EVENING (Patient taking differently: TAKE 1 TAB IN THE AM, 1 TAB  MID AM, 1 TAB IN THE AFTERNOON, 1 TAB IN THE EVENING) 450 tablet 5  . Carbidopa-Levodopa ER (SINEMET CR) 25-100 MG tablet controlled release TAKE 1 TABLET BY MOUTH EVERYDAY AT BEDTIME 90 tablet 1  . cholecalciferol (VITAMIN D) 1000 units tablet Take 2,000 Units by mouth daily.    Marland Kitchen co-enzyme Q-10 30 MG capsule Take 30 mg by mouth 3 (three) times daily.    . Cranberry 300 MG tablet Take 300 mg by mouth daily.    . fosfomycin (MONUROL) 3 g PACK Mix in 4 ounces of water and Administer immediately 3 g 0  . irbesartan (AVAPRO) 75 MG tablet Take 37.5 mg by mouth at bedtime.     Marland Kitchen levothyroxine (SYNTHROID, LEVOTHROID) 112 MCG tablet Take 112 mcg by mouth daily before breakfast.    .  MYRBETRIQ 25 MG TB24 tablet Take 25 mg by mouth daily.  1  . NONFORMULARY OR COMPOUNDED ITEM Estradiol vag cream 0.02% (0.2mg /ml)  S:  Insert 1 ml (1 syringe) into the vagina hs 2 times weekly as directed. 24 each 4  . rivastigmine (EXELON) 13.3 MG/24HR Place 1 patch (13.3 mg total) onto the skin daily. 30 patch 3  . nystatin-triamcinolone ointment (MYCOLOG) Apply 1 application topically 2 (two) times daily. 30 g 0  . raloxifene (EVISTA) 60 MG tablet TAKE 1 TABLET BY MOUTH EVERY DAY 90 tablet 2   No facility-administered medications prior to visit.    PAST MEDICAL HISTORY: Past Medical History:  Diagnosis Date  . Anxiety   . Arthritis    knee & back   . Blood type, Rh negative   . Breast cancer ([place])   . Chronic fatigue   . Depression   . Diverticulosis   . Endocervical polyp   . GERD (gastroesophageal reflux disease)   . Hypertension   . Hypothyroidism   . IBS (irritable bowel syndrome)   . Insomnia   . Lymphedema   . Neuromuscular disorder ([place])    Parkinson's disease   . Osteopenia 06/2015   T score -2.4 distal third of the radius  . Parkinson disease ([place])   . RBBB 10/07/2013  . Thyroid cancer ([place])     PAST SURGICAL HISTORY: Past Surgical History:  Procedure Laterality Date  . APPENDECTOMY  1979  . BREAST SURGERY  1979   MASTECTOMY AND RECONSTRUCTION  . DILATION AND CURETTAGE OF UTERUS     MISCARRIAGE  . EYE SURGERY Bilateral    w/ IOL  . HYSTEROSCOPY  [id number]   AND D&C  . MASTECTOMY Bilateral   . THYROIDECTOMY    . TONSILLECTOMY    . TOTAL KNEE ARTHROPLASTY Right 10/07/2013   Procedure: RIGHT TOTAL KNEE ARTHROPLASTY;  Surgeon: Kerin Salen, MD;  Location: [place];  Service: Orthopedics;  Laterality: Right;  . TUBAL LIGATION      FAMILY HISTORY: Family History  Problem Relation Age of Onset  . Hypertension Mother   . Hyperlipidemia Mother   . Heart failure Mother   . Hypertension Father   . Hyperlipidemia Father   . Heart failure Father   .  Hyperlipidemia Brother   . Hypertension Brother   . Heart attack Brother   . Breast cancer Neg Hx     SOCIAL HISTORY: Social History   Socioeconomic History  . Marital status: Married    Spouse name: Not on file  . Number of children: Not on file  . Years of education: Not on file  . Highest education level: Not  on file  Occupational History  . Occupation: retired    Comment: "odd jobs"  Tobacco Use  . Smoking status: Never Smoker  . Smokeless tobacco: Never Used  Vaping Use  . Vaping Use: Never used  Substance and Sexual Activity  . Alcohol use: Yes    Alcohol/week: 0.0 standard drinks    Comment: rare  . Drug use: No  . Sexual activity: Never    Birth control/protection: Surgical, Post-menopausal    Comment: 1st intercourse 87 yo-Fewer than 5 partners  Other Topics Concern  . Not on file  Social History Narrative  . Not on file   Social Determinants of Health   Financial Resource Strain: Not on file  Food Insecurity: Not on file  Transportation Needs: Not on file  Physical Activity: Not on file  Stress: Not on file  Social Connections: Not on file  Intimate Partner Violence: Not on file      PHYSICAL EXAM  Vitals:   05/26/20 1400  BP: (!) 141/86  Pulse: 82  Weight: 124 lb (56.2 kg)  Height: 5\' 3"  (1.6 m)   Body mass index is 21.97 kg/m.  Generalized: Well developed, in no acute distress   Neurological examination  Mentation: Alert oriented to time, place, history taking. Follows all commands speech and language fluent Cranial nerve II-XII: Pupils were equal round reactive to light. Extraocular movements were full, visual field were full on confrontational test. Facial sensation and strength were normal. Uvula tongue midline. Head turning and shoulder shrug  were normal and symmetric. Motor: The motor testing reveals 5 over 5 strength of all 4 extremities. No tremor noted Sensory: Sensory testing is intact to soft touch on all 4 extremities. No  evidence of extinction is noted.  Coordination: Cerebellar testing reveals good finger-nose-finger and heel-to-shin bilaterally.  Gait and station: Uses a walker when ambulating.  Gait is slow and cautious.  3-4 steps with turns Reflexes: Deep tendon reflexes are symmetric and normal bilaterally.   DIAGNOSTIC DATA (LABS, IMAGING, TESTING) - I reviewed patient records, labs, notes, testing and imaging myself where available.  Lab Results  Component Value Date   WBC 9.4 09/03/2019   HGB 13.0 09/03/2019   HCT 40.0 09/03/2019   MCV 98.5 09/03/2019   PLT 186 09/03/2019      Component Value Date/Time   NA 138 09/03/2019 1515   NA 139 10/15/2013 0000   K 4.2 09/03/2019 1515   CL 106 09/03/2019 1515   CO2 24 09/03/2019 1515   GLUCOSE 96 09/03/2019 1515   BUN 25 (H) 09/03/2019 1515   BUN 18 10/15/2013 0000   CREATININE 0.69 09/03/2019 1515   CALCIUM 8.5 (L) 09/03/2019 1515   PROT 6.8 09/03/2019 1515   ALBUMIN 3.9 09/03/2019 1515   AST 16 09/03/2019 1515   ALT 6 09/03/2019 1515   ALKPHOS 67 09/03/2019 1515   BILITOT 0.8 09/03/2019 1515   GFRNONAA >60 09/03/2019 1515   GFRAA >60 09/03/2019 1515      ASSESSMENT AND PLAN 87 y.o. year old female  has a past medical history of Anxiety, Arthritis, Blood type, Rh negative, Breast cancer ([place]), Chronic fatigue, Depression, Diverticulosis, Endocervical polyp, GERD (gastroesophageal reflux disease), Hypertension, Hypothyroidism, IBS (irritable bowel syndrome), Insomnia, Lymphedema, Neuromuscular disorder ([place]), Osteopenia (06/2015), Parkinson disease ([place]), RBBB (10/07/2013), and Thyroid cancer ([place]). here with:  1.  Primary parkinsonism  --Continue Sinemet 25-100 mg 4 times a day -- Continue Sinemet CR 25-100 mg at bedtime  2.  Memory disturbance  --  Continue Exelon patch 9.5  mg every 24 hours  Follow-up in 6 months or sooner if needed Advised if symptoms worsen or she develops new symptoms she should let us know.   I spent 30  minutes of face-to-face and non-face-to-face time with patient.  This included previsit chart review, lab review, study review, order entry, electronic health record documentation, patient education.  Ward Givens, MSN, NP-C 05/26/2020, 2:08 PM [facility] [address] [place], [place] [address] [phone]   I reviewed the above note and documentation by the Nurse Practitioner and agree with the history, exam, assessment and plan as outlined above. I was available for consultation. Star Age, MD, PhD [facility])

## 2020-05-27 ENCOUNTER — Encounter: Payer: BC Managed Care – PPO | Admitting: Internal Medicine

## 2020-06-01 ENCOUNTER — Encounter: Payer: Self-pay | Admitting: Internal Medicine

## 2020-06-01 ENCOUNTER — Non-Acute Institutional Stay: Payer: BC Managed Care – PPO | Admitting: Internal Medicine

## 2020-06-01 ENCOUNTER — Other Ambulatory Visit: Payer: Self-pay

## 2020-06-01 VITALS — HR 86 | Temp 96.3°F | Ht 63.0 in | Wt 125.0 lb

## 2020-06-01 DIAGNOSIS — E890 Postprocedural hypothyroidism: Secondary | ICD-10-CM

## 2020-06-01 DIAGNOSIS — G20A1 Parkinson's disease without dyskinesia, without mention of fluctuations: Secondary | ICD-10-CM

## 2020-06-01 DIAGNOSIS — R4189 Other symptoms and signs involving cognitive functions and awareness: Secondary | ICD-10-CM | POA: Diagnosis not present

## 2020-06-01 DIAGNOSIS — F339 Major depressive disorder, recurrent, unspecified: Secondary | ICD-10-CM | POA: Diagnosis not present

## 2020-06-01 DIAGNOSIS — R5382 Chronic fatigue, unspecified: Secondary | ICD-10-CM

## 2020-06-01 DIAGNOSIS — I10 Essential (primary) hypertension: Secondary | ICD-10-CM

## 2020-06-01 DIAGNOSIS — G20 Parkinson's disease: Secondary | ICD-10-CM | POA: Diagnosis not present

## 2020-06-01 DIAGNOSIS — K5909 Other constipation: Secondary | ICD-10-CM

## 2020-06-01 DIAGNOSIS — W19XXXA Unspecified fall, initial encounter: Secondary | ICD-10-CM

## 2020-06-01 NOTE — Progress Notes (Signed)
Location:  [place] of Service:  Clinic (12)  Provider:   Code Status:  Goals of Care:  Advanced Directives 09/03/2019  Does Patient Have a Medical Advance Directive? No  Type of Advance Directive -  Does patient want to make changes to medical advance directive? -  Copy of [place] in Chart? -  Would patient like information on creating a medical advance directive? -  Pre-existing out of facility DNR order (yellow form or pink MOST form) -     Chief Complaint  Patient presents with  . Medical Management of Chronic Issues    Patient returns to the clinic to establish care. 4th covid vaccine was 05/28/20.    HPI: Patient is a 87 y.o. female seen today for medical management of chronic diseases.   Patient has h/o Parkinson disease, hypertension hypothyroidism, history of breast cancer s/p mastectomy, history of chronic back pain.  Patient lives with her husband in the [place].  They wanted to come and and see if they wanted to switch to [facility].   Dr. Delfina [place]wood is their PCP.  But it is getting harder for her to go for her appointments.  Her husband thinks it would be easier for her to see me in the clinic in facility.  Patient sees Dr. Rexene Alberts for her Parkinson.  Has seen [name] before.  Has seen Dr. Mervyn Skeeters at [facility].  Planning to see another physician in [facility] for opinion. Patient has had extensive work-up.  The husband states that they are still not sure if she has Parkinson.  She is on Sinemet but states that sometimes she misses the doses do not take it .  She does not see any difference even with missing the doses..   cognitive impairment .The husband states that she repeats herself and forgets what he told her few minutes ago.  She is on Exelon patch which was increased but she did not tolerated. Has had an MRI done before which shows chronic microvascular changes and mild atrophy   depression Had been on citalopram and  sertraline before but not on any antidepressant at this time husband states that she has been on Cymbalta also Chronic fatigue She says she has no energy and wants to sleep most of the time Recurrent falls Denies any dizziness just states that her legs gave away and she loses her balance and falls.  Most of the time seems to happen when she is not using her walker.  Husband is the Main caregiver  Past Medical History:  Diagnosis Date  . Anxiety   . Arthritis    knee & back   . Blood type, Rh negative   . Breast cancer ([place])   . Chronic fatigue   . Depression   . Diverticulosis   . Endocervical polyp   . GERD (gastroesophageal reflux disease)   . Hypertension   . Hypothyroidism   . IBS (irritable bowel syndrome)   . Insomnia   . Lymphedema   . Neuromuscular disorder ([place])    Parkinson's disease   . Osteopenia 06/2015   T score -2.4 distal third of the radius  . Parkinson disease ([place])   . RBBB 10/07/2013  . Thyroid cancer [facility])     Past Surgical History:  Procedure Laterality Date  . APPENDECTOMY  1979  . BREAST SURGERY  1979   MASTECTOMY AND RECONSTRUCTION  . DILATION AND CURETTAGE OF UTERUS     MISCARRIAGE  .  EYE SURGERY Bilateral    w/ IOL  . HYSTEROSCOPY  [id number]   AND D&C  . MASTECTOMY Bilateral   . THYROIDECTOMY    . TONSILLECTOMY    . TOTAL KNEE ARTHROPLASTY Right 10/07/2013   Procedure: RIGHT TOTAL KNEE ARTHROPLASTY;  Surgeon: Kerin Salen, MD;  Location: [place];  Service: Orthopedics;  Laterality: Right;  . TUBAL LIGATION      Allergies  Allergen Reactions  . Sulfa Antibiotics Rash  . Biaxin [Clarithromycin] Other (See Comments)    Pt does not remember reaction   . Ciprofloxacin Nausea And Vomiting  . Hctz [Hydrochlorothiazide] Other (See Comments)    weakness and hyponatremia  . Macrobid [Nitrofurantoin] Nausea Only  . Other Nausea Only  . Prednisone Other (See Comments)    Swelling and facial redness   . Sulfamethoxazole-Trimethoprim Other  (See Comments) and Rash    Sore throat    Outpatient Encounter Medications as of 06/01/2020  Medication Sig  . amLODipine (NORVASC) 2.5 MG tablet Take 2.5 mg by mouth daily.  . Ascorbic Acid (VITAMIN C PO) Take by mouth.  Marland Kitchen aspirin EC 81 MG tablet Take 81 mg by mouth daily.  . Calcium Carb-Cholecalciferol (CALCIUM 600 + D PO) Take 1 tablet by mouth daily with lunch.  . carbidopa-levodopa (SINEMET IR) 25-100 MG tablet Take by mouth. 1 in am, 1 at 11a, 1 at 3p and 1 at 8p  . Carbidopa-Levodopa ER (SINEMET CR) 25-100 MG tablet controlled release TAKE 1 TABLET BY MOUTH EVERYDAY AT BEDTIME  . cholecalciferol (VITAMIN D) 1000 units tablet Take 2,000 Units by mouth daily.  Marland Kitchen co-enzyme Q-10 30 MG capsule Take 30 mg by mouth daily.  . Cranberry 300 MG tablet Take 300 mg by mouth every other day.  . irbesartan (AVAPRO) 150 MG tablet Take 150 mg by mouth at bedtime.  Marland Kitchen levothyroxine (SYNTHROID, LEVOTHROID) 112 MCG tablet Take 112 mcg by mouth daily before breakfast.  . MYRBETRIQ 25 MG TB24 tablet Take 25 mg by mouth daily.  . NONFORMULARY OR COMPOUNDED ITEM Estradiol vag cream 0.02% (0.2mg /ml)  S:  Insert 1 ml (1 syringe) into the vagina hs 2 times weekly as directed.  . rivastigmine (EXELON) 9.5 mg/24hr Place 1 patch (9.5 mg total) onto the skin daily.  . [DISCONTINUED] carbidopa-levodopa (SINEMET IR) 25-100 MG tablet TAKE 1 & 1/2 TAB IN THE AM, 1 TAB MID AM, 1 & 1/2 TAB IN THE AFTERNOON, 1 TAB IN THE EVENING (Patient taking differently: TAKE 1 TAB IN THE AM, 1 TAB MID AM, 1 TAB IN THE AFTERNOON, 1 TAB IN THE EVENING)  . [DISCONTINUED] fosfomycin (MONUROL) 3 g PACK Mix in 4 ounces of water and Administer immediately   No facility-administered encounter medications on file as of 06/01/2020.    Review of Systems:  Review of Systems  Constitutional: Positive for activity change.  HENT: Negative.   Respiratory: Negative.   Cardiovascular: Negative.   Gastrointestinal: Positive for constipation.   Genitourinary: Negative.   Musculoskeletal: Positive for back pain and gait problem.  Skin: Negative.   Neurological: Positive for weakness. Negative for dizziness and light-headedness.  Psychiatric/Behavioral: Positive for confusion and dysphoric mood.    Health Maintenance  Topic Date Due  . PAP SMEAR-Modifier  03/15/2013  . COVID-19 Vaccine (3 - Moderna risk 4-dose series) 03/28/2019  . TETANUS/TDAP  03/11/2020  . INFLUENZA VACCINE  08/24/2020  . DEXA SCAN  Completed  . PNA vac Low Risk Adult  Completed  . HPV VACCINES  Aged Out    Physical Exam: Vitals:   06/01/20 1128  Pulse: 86  Temp: (!) 96.3 F (35.7 C)  Weight: 125 lb (56.7 kg)  Height: 5\' 3"  (1.6 m)   Body mass index is 22.14 kg/m. Physical Exam Vitals reviewed. Nursing note reviewed: Was not orthostaticbut very hard ot get BP.  Constitutional:      Appearance: Normal appearance.  HENT:     Head: Normocephalic.     Nose: Nose normal.     Mouth/Throat:     Mouth: Mucous membranes are moist.     Pharynx: Oropharynx is clear.  Eyes:     Pupils: Pupils are equal, round, and reactive to light.  Cardiovascular:     Rate and Rhythm: Normal rate and regular rhythm.     Pulses: Normal pulses.     Heart sounds: Normal heart sounds.  Pulmonary:     Effort: Pulmonary effort is normal.     Breath sounds: Normal breath sounds.  Abdominal:     General: Abdomen is flat. Bowel sounds are normal.     Palpations: Abdomen is soft.  Musculoskeletal:        General: No swelling.     Cervical back: Neck supple.  Skin:    General: Skin is warm and dry.  Neurological:     General: No focal deficit present.     Mental Status: She is alert and oriented to person, place, and time.     Comments: Able to walk with walker. Able to stand by herself No tremor noticed  Psychiatric:        Mood and Affect: Mood normal.        Thought Content: Thought content normal.    MMSE - Mini Mental State Exam 06/01/2020 04/28/2017  12/21/2016  Not completed: - Refused Unable to complete  Orientation to time 5 - -  Orientation to Place 5 - -  Registration 3 - -  Attention/ Calculation 5 - -  Recall 2 - -  Language- name 2 objects 2 - -  Language- repeat 1 - -  Language- follow 3 step command 3 - -  Language- read & follow direction 1 - -  Write a sentence 0 - -  Copy design 0 - -  Total score 27 - -     Labs reviewed: Basic Metabolic Panel: Recent Labs    09/03/19 1515  NA 138  K 4.2  CL 106  CO2 24  GLUCOSE 96  BUN 25*  CREATININE 0.69  CALCIUM 8.5*   Liver Function Tests: Recent Labs    09/03/19 1515  AST 16  ALT 6  ALKPHOS 67  BILITOT 0.8  PROT 6.8  ALBUMIN 3.9   Recent Labs    09/03/19 1515  LIPASE 27   No results for input(s): AMMONIA in the last 8760 hours. CBC: Recent Labs    09/03/19 1515  WBC 9.4  HGB 13.0  HCT 40.0  MCV 98.5  PLT 186   Lipid Panel: No results for input(s): CHOL, HDL, LDLCALC, TRIG, CHOLHDL, LDLDIRECT in the last 8760 hours. Lab Results  Component Value Date   HGBA1C 5.6 10/15/2013    Procedures since last visit: No results found.  Assessment/Plan 1. Parkinson's disease (tremor, stiffness, slow motion, unstable posture) ([place]) [facility] Appointment No Improvement with Sinemet per Patient I am not even sure if she takes it scheduled or Misses the dose in between Not interested in any therapy. Want to know what [facility]  Physician will do  2. Cognitive impairment Did not tolerate Higher dose of Exelon due to Nausea Wants to continue this dose MMSE today was 27/30  3. Chronic fatigue ? Etiology ? Due to Sinemet Symptoms Does not seem to be related to Parkinson  Most Fatigue in the morning. Slight improvement with the day  4. Depression, recurrent ([place]) ? Restart Cymbalta She seems open to it Will not make any change today  5. Postoperative hypothyroidism TSH has been normal with her PCP  6. Primary hypertension Was not  orthostatic If Fall continue ? Discontinue Norvasc  7. Other constipation Encourage Hydration Restart Miralax more consitently  8. Fall, initial encounter Refuses Therapy Eval at this time   Wants to decide if wants to follow with [facility] or Dr Delfina Redwood So would not give follow up yet Will call to see me for next appointment   Labs/tests ordered:  * No order type specified * Next appt:  Visit date not found  Total time spent in this patient care encounter was  60_  minutes; greater than 50% of the visit spent counseling patient and staff, reviewing records , Labs and coordinating care for problems addressed at this encounter.

## 2020-06-04 ENCOUNTER — Other Ambulatory Visit: Payer: Self-pay | Admitting: Internal Medicine

## 2020-06-04 DIAGNOSIS — M8580 Other specified disorders of bone density and structure, unspecified site: Secondary | ICD-10-CM

## 2020-07-22 ENCOUNTER — Other Ambulatory Visit: Payer: Self-pay

## 2020-07-22 ENCOUNTER — Encounter: Payer: Self-pay | Admitting: Internal Medicine

## 2020-07-22 ENCOUNTER — Non-Acute Institutional Stay: Payer: BC Managed Care – PPO | Admitting: Internal Medicine

## 2020-07-22 VITALS — HR 78 | Temp 96.0°F | Ht 63.0 in | Wt 124.8 lb

## 2020-07-22 DIAGNOSIS — E890 Postprocedural hypothyroidism: Secondary | ICD-10-CM | POA: Diagnosis not present

## 2020-07-22 DIAGNOSIS — G20 Parkinson's disease: Secondary | ICD-10-CM

## 2020-07-22 DIAGNOSIS — R5382 Chronic fatigue, unspecified: Secondary | ICD-10-CM

## 2020-07-22 DIAGNOSIS — R4189 Other symptoms and signs involving cognitive functions and awareness: Secondary | ICD-10-CM

## 2020-07-22 DIAGNOSIS — F339 Major depressive disorder, recurrent, unspecified: Secondary | ICD-10-CM | POA: Diagnosis not present

## 2020-07-22 DIAGNOSIS — Z9181 History of falling: Secondary | ICD-10-CM

## 2020-07-22 DIAGNOSIS — I10 Essential (primary) hypertension: Secondary | ICD-10-CM

## 2020-07-23 ENCOUNTER — Ambulatory Visit
Admission: RE | Admit: 2020-07-23 | Discharge: 2020-07-23 | Disposition: A | Discharge status: Home / Self Care | Payer: BC Managed Care – PPO | Source: Ambulatory Visit | Attending: Internal Medicine | Admitting: Internal Medicine

## 2020-07-23 ENCOUNTER — Other Ambulatory Visit: Payer: Self-pay

## 2020-07-23 ENCOUNTER — Encounter: Payer: Self-pay | Admitting: Internal Medicine

## 2020-07-23 DIAGNOSIS — M8580 Other specified disorders of bone density and structure, unspecified site: Secondary | ICD-10-CM

## 2020-07-23 NOTE — Progress Notes (Signed)
Location: [place] of Service:  Clinic (12)  Provider:   Code Status:  Goals of Care:  Advanced Directives 07/22/2020  Does Patient Have a Medical Advance Directive? No  Type of Advance Directive -  Does patient want to make changes to medical advance directive? -  Copy of [place] in Chart? -  Would patient like information on creating a medical advance directive? No - Patient declined  Pre-existing out of facility DNR order (yellow form or pink MOST form) -     Chief Complaint  Patient presents with   Medical Management of Chronic Issues    Patient returns to the clinic to discuss updates from parkinsons visit.    HPI: Patient is a 87 y.o. female seen today for an acute visit for Follow up visit  Patient has h/o Parkinson disease, hypertension hypothyroidism, history of breast cancer s/p mastectomy, history of chronic back pain.   Patient lives with her husband in the [place]  Patient sees Dr. Rexene Alberts for her [place].  Has seen [name] before.  Has seen Dr. Mervyn Skeeters at [facility] Patient has had extensive work-up  Parkinson Was seen by Dr Jenene Slicker in [facility]. Per her notes patient has definite diagnosis of Parkinson disease.  She recommended increasing the carbidopa/levodopa to 1.5 tablets for the first 2 doses and then eventually increased to 1.5 for all 4 doses Patient's husband has not made that change for her yet.  He wanted to talk to me about the change and wanted to make sure it was okay to make these changes. Patient now also is taking Lexapro by Dr. Delfina Redwood. She also was told that she has Cognitive impairment     Past Medical History:  Diagnosis Date   Anxiety    Arthritis    knee & back    Blood type, Rh negative    Breast cancer (HCC)    Chronic fatigue    Depression    Diverticulosis    Endocervical polyp    GERD (gastroesophageal reflux disease)    Hypertension    Hypothyroidism    IBS  (irritable bowel syndrome)    Insomnia    Lymphedema    Neuromuscular disorder ([place])    Parkinson's disease    Osteopenia 06/2015   T score -2.4 distal third of the radius   Parkinson disease ([place])    RBBB 10/07/2013   Thyroid cancer ([place])     Past Surgical History:  Procedure Laterality Date   APPENDECTOMY  1979   BREAST SURGERY  1979   MASTECTOMY AND RECONSTRUCTION   DILATION AND CURETTAGE OF UTERUS     MISCARRIAGE   EYE SURGERY Bilateral    w/ IOL   HYSTEROSCOPY  2005,2009   AND D&C   MASTECTOMY Bilateral    THYROIDECTOMY     TONSILLECTOMY     TOTAL KNEE ARTHROPLASTY Right 10/07/2013   Procedure: RIGHT TOTAL KNEE ARTHROPLASTY;  Surgeon: Kerin Salen, MD;  Location: [place];  Service: Orthopedics;  Laterality: Right;   TUBAL LIGATION      Allergies  Allergen Reactions   Sulfa Antibiotics Rash   Biaxin [Clarithromycin] Other (See Comments)    Pt does not remember reaction    Ciprofloxacin Nausea And Vomiting   Hctz [Hydrochlorothiazide] Other (See Comments)    weakness and hyponatremia   Macrobid [Nitrofurantoin] Nausea Only   Other Nausea Only   Prednisone Other (See Comments)    Swelling and facial redness  Sulfamethoxazole-Trimethoprim Other (See Comments) and Rash    Sore throat    Outpatient Encounter Medications as of 07/22/2020  Medication Sig   amLODipine (NORVASC) 2.5 MG tablet Take 2.5 mg by mouth daily.   Ascorbic Acid (VITAMIN C PO) Take by mouth.   aspirin EC 81 MG tablet Take 81 mg by mouth daily.   carbidopa-levodopa (SINEMET IR) 25-100 MG tablet Take by mouth. 1 in am, 1 at 11a, 1 at 3p and 1 at 8p   Carbidopa-Levodopa ER (SINEMET CR) 25-100 MG tablet controlled release TAKE 1 TABLET BY MOUTH EVERYDAY AT BEDTIME   cholecalciferol (VITAMIN D) 1000 units tablet Take 2,000 Units by mouth daily.   co-enzyme Q-10 30 MG capsule Take 30 mg by mouth daily.   Cranberry 300 MG tablet Take 300 mg by mouth every other day.   irbesartan (AVAPRO) 150 MG  tablet Take 150 mg by mouth at bedtime.   levothyroxine (SYNTHROID, LEVOTHROID) 112 MCG tablet Take 112 mcg by mouth daily before breakfast.   MYRBETRIQ 25 MG TB24 tablet Take 25 mg by mouth daily.   NONFORMULARY OR COMPOUNDED ITEM Estradiol vag cream 0.02% (0.2mg /ml)  S:  Insert 1 ml (1 syringe) into the vagina hs 2 times weekly as directed.   rivastigmine (EXELON) 9.5 mg/24hr Place 1 patch (9.5 mg total) onto the skin daily.   Calcium Carb-Cholecalciferol (CALCIUM 600 + D PO) Take 1 tablet by mouth daily with lunch. (Patient not taking: Reported on 07/22/2020)   No facility-administered encounter medications on file as of 07/22/2020.    Review of Systems:  Review of Systems  Constitutional:  Positive for activity change.  HENT: Negative.    Respiratory: Negative.    Cardiovascular: Negative.   Gastrointestinal: Negative.   Genitourinary: Negative.   Musculoskeletal:  Positive for gait problem.  Skin: Negative.   Neurological:  Positive for weakness.  Psychiatric/Behavioral:  Positive for confusion and dysphoric mood.    Health Maintenance  Topic Date Due   Zoster Vaccines- Shingrix (1 of 2) Never done   PAP SMEAR-Modifier  03/15/2013   COVID-19 Vaccine (3 - Moderna risk series) 03/28/2019   TETANUS/TDAP  03/11/2020   INFLUENZA VACCINE  08/24/2020   DEXA SCAN  Completed   PNA vac Low Risk Adult  Completed   HPV VACCINES  Aged Out    Physical Exam: Vitals:   07/22/20 1107  Pulse: 78  Temp: (!) 96 F (35.6 C)  SpO2: 98%  Weight: 124 lb 12.8 oz (56.6 kg)  Height: 5\' 3"  (1.6 m)   Body mass index is 22.11 kg/m. Physical Exam Constitutional: Oriented to person, place, and time. Well-developed and well-nourished.  HENT:  Head: Normocephalic.  Mouth/Throat: Oropharynx is clear and moist.  Eyes: Pupils are equal, round, and reactive to light.  Neck: Neck supple.  Cardiovascular: Normal rate and normal heart sounds.  No murmur heard. Pulmonary/Chest: Effort normal and  breath sounds normal. No respiratory distress. No wheezes. She has no rales.  Abdominal: Soft. Bowel sounds are normal. No distension. There is no tenderness. There is no rebound.  Musculoskeletal: No edema.  Lymphadenopathy: none Neurological: Alert and oriented to person, place, and time.  Able to walk with walker. Able to stand by herself No tremor noticed   Skin: Skin is warm and dry.  Psychiatric: Normal mood and affect. Behavior is normal. Thought content normal.   Labs reviewed: Basic Metabolic Panel: Recent Labs    09/03/19 1515  NA 138  K 4.2  CL 106  CO2 24  GLUCOSE 96  BUN 25*  CREATININE 0.69  CALCIUM 8.5*   Liver Function Tests: Recent Labs    09/03/19 1515  AST 16  ALT 6  ALKPHOS 67  BILITOT 0.8  PROT 6.8  ALBUMIN 3.9   Recent Labs    09/03/19 1515  LIPASE 27   No results for input(s): AMMONIA in the last 8760 hours. CBC: Recent Labs    09/03/19 1515  WBC 9.4  HGB 13.0  HCT 40.0  MCV 98.5  PLT 186   Lipid Panel: No results for input(s): CHOL, HDL, LDLCALC, TRIG, CHOLHDL, LDLDIRECT in the last 8760 hours. Lab Results  Component Value Date   HGBA1C 5.6 10/15/2013    Procedures since last visit: No results found.  Assessment/Plan Parkinson's disease (tremor, stiffness, slow motion, unstable posture) (HCC) D/w Patient and her Husband they can change the dose of Sinemet as suggested by [facility] They are going to follow with Dr Rexene Alberts office They do not want ot go to DR Theganatt again due to Distance Cognitive impairment Most likely due to Parkinson disease On Low dose of Exelon Did not tolerate higher dose due to Nausea  Depression, recurrent ([place]) Doing well on Lexapro continue for now Postoperative hypothyroidism Follows with Dr Delfina Redwood Primary hypertension On Low dose fo Norvasc At high risk for falls Does not want therapy right now Refuse Therapy eval right now Chronic fatigue ? Improve with Sinemet  Patient will follow  with Dr Delfina Redwood and Dr Rexene Alberts She will let me know of she decides to change her care to [facility]  Labs/tests ordered:  * No order type specified * Next appt:  Visit date not found

## 2020-08-19 IMAGING — MG DIGITAL SCREENING UNILATERAL LEFT MAMMOGRAM WITH IMPLANTS, CAD A
6 series · 6 of 14 positions shown · non-contrast
Comparison: Previous exam(s).

CLINICAL DATA: Screening.

EXAM:
DIGITAL SCREENING UNILATERAL LEFT MAMMOGRAM WITH IMPLANTS, CAD AND
TOMO
The patient has retropectoral implants. Standard and implant
displaced views were performed.

[L CC]
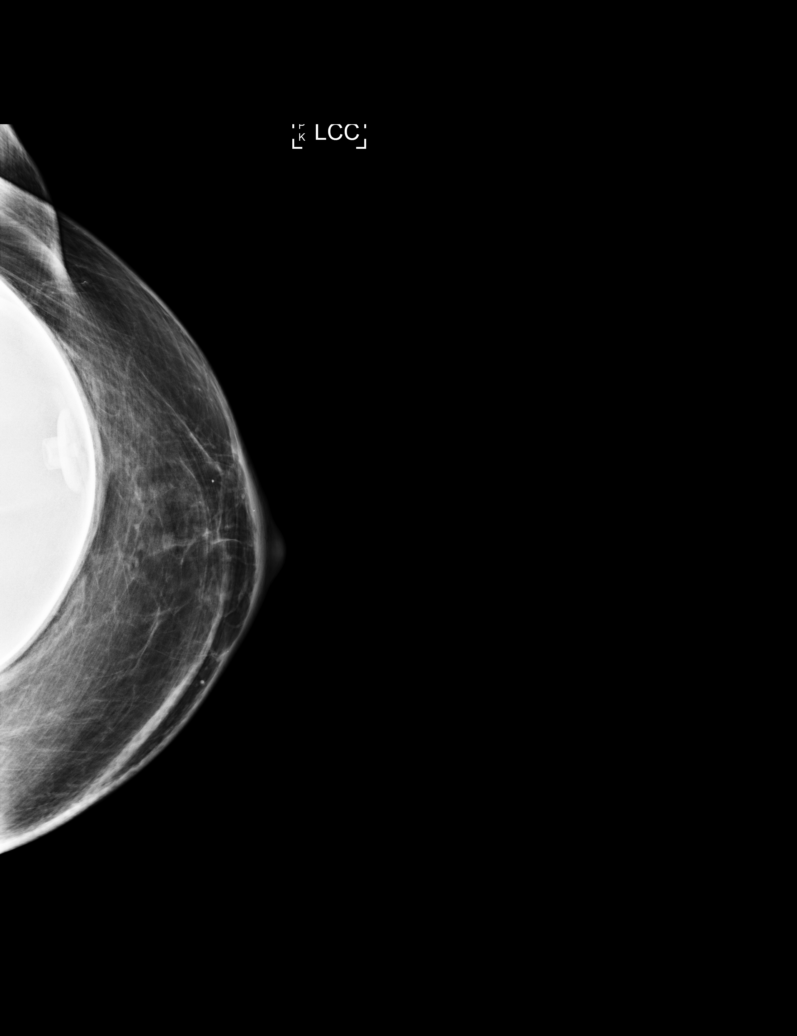

[L MLO]
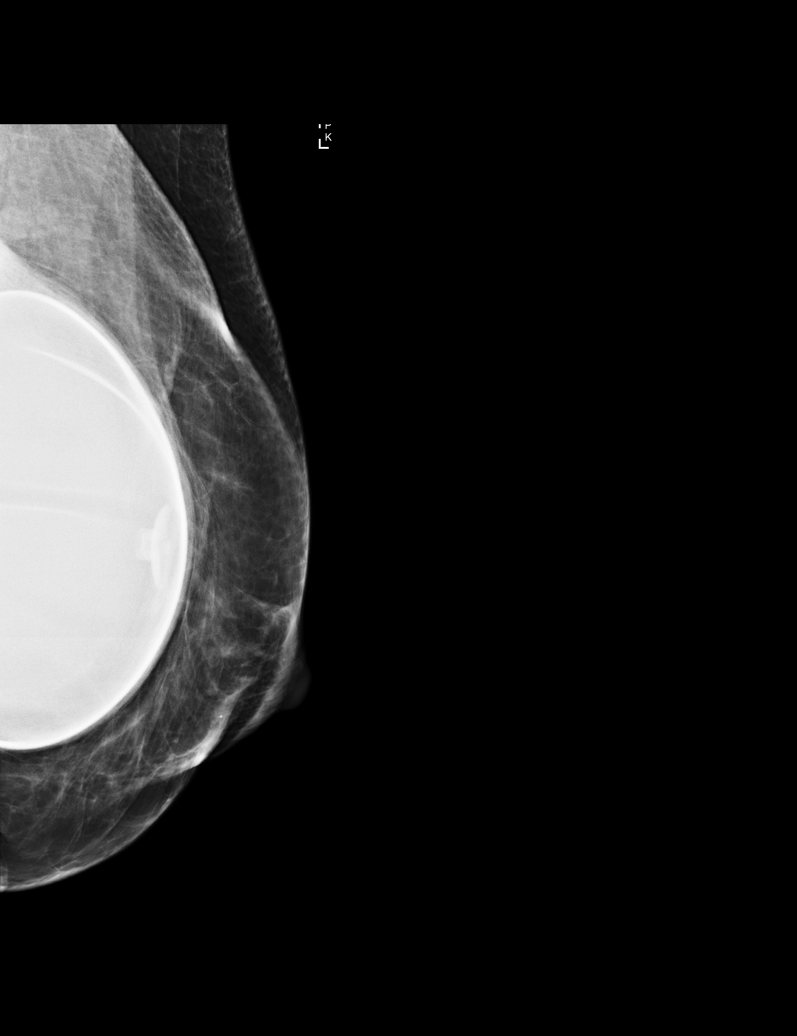

[L CC synth-2D]
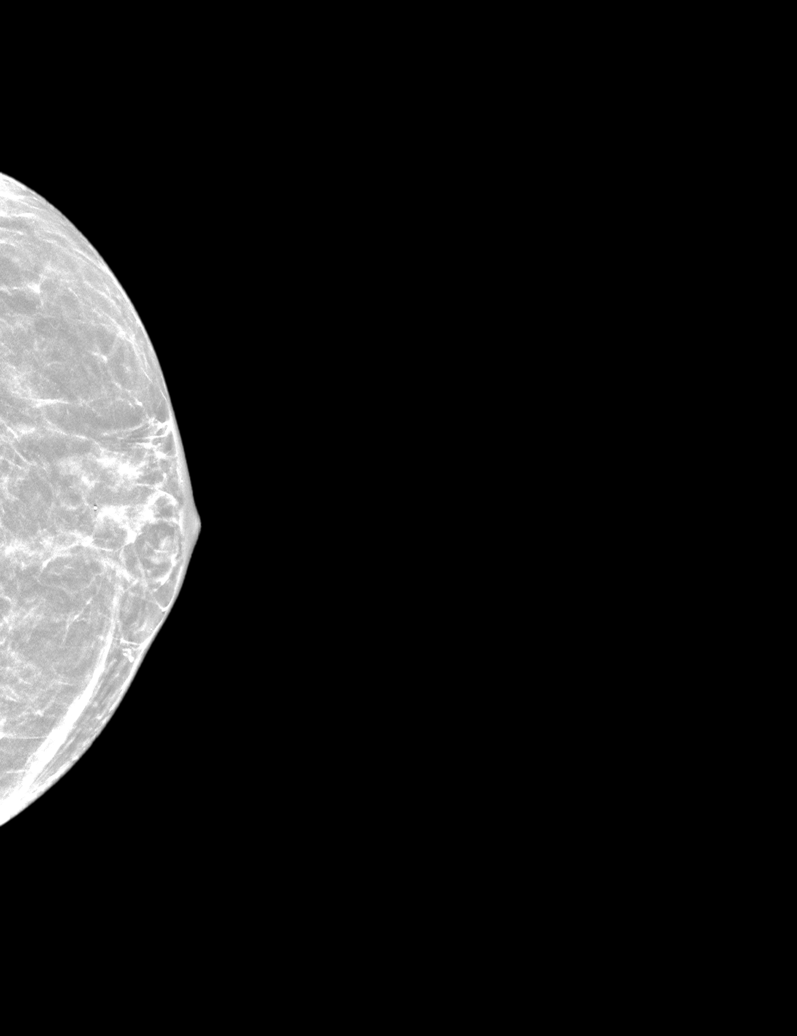

[L MLO synth-2D]
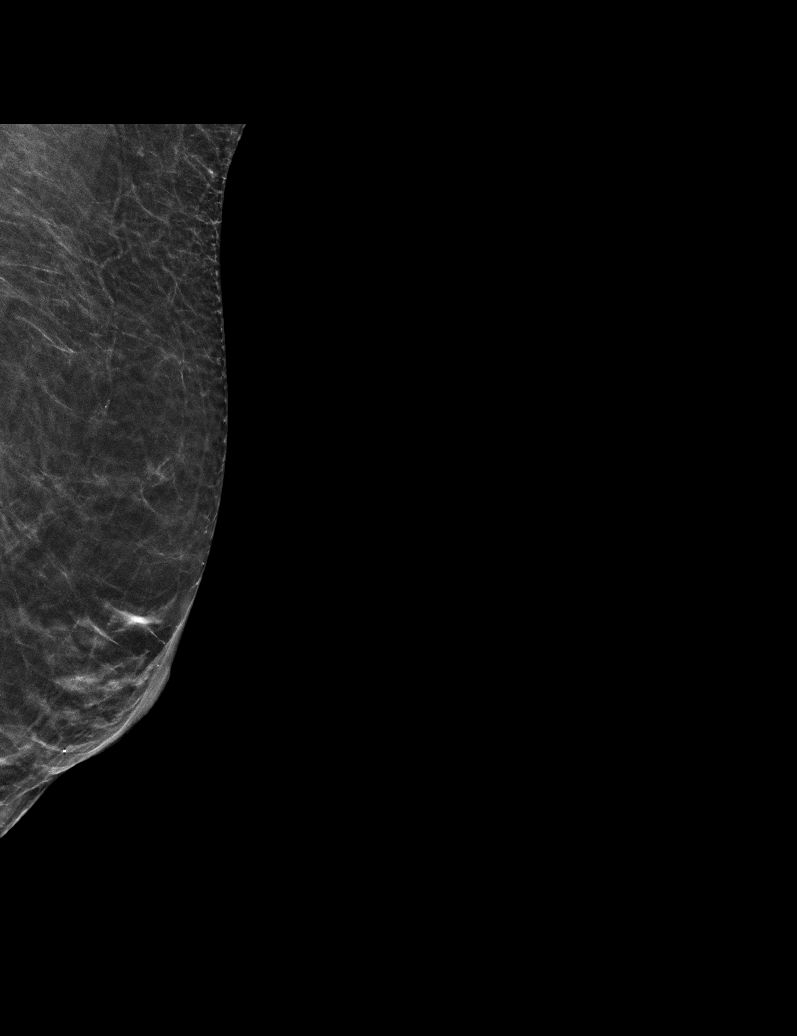

[L MLOID BREAST TOMOSYNTHESIS IMAGE tomo · tomo slice 22/43.0]
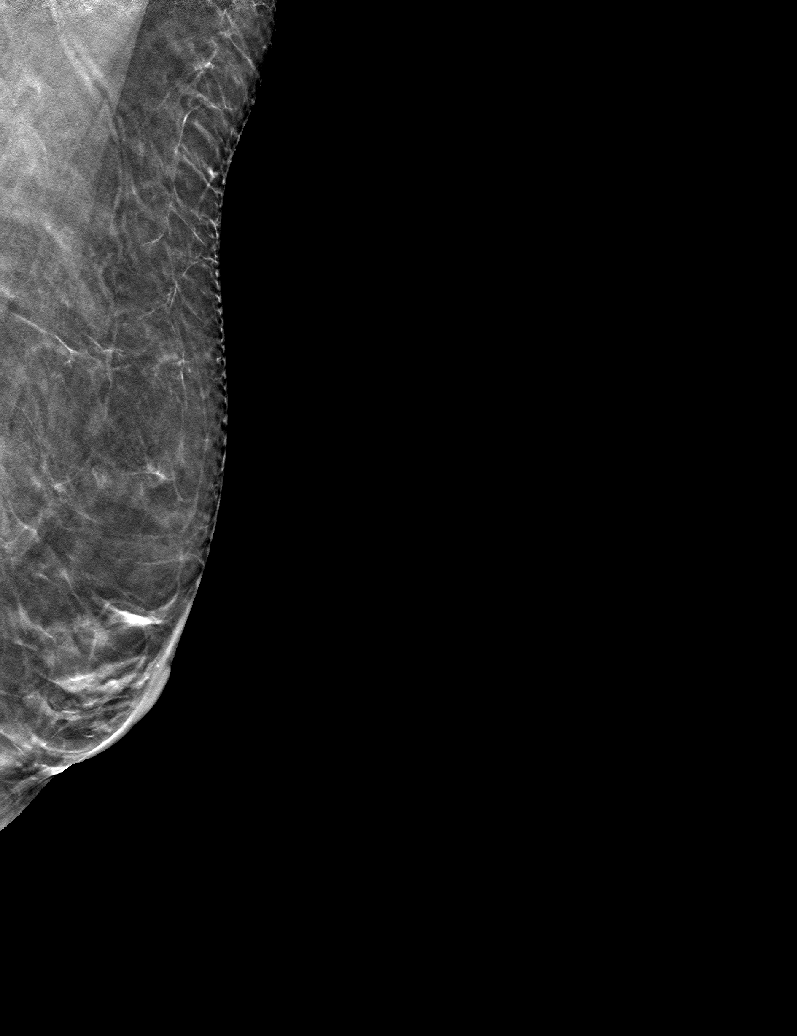

[L CCID BREAST TOMOSYNTHESIS IMAGE tomo · tomo slice 21/41.0]
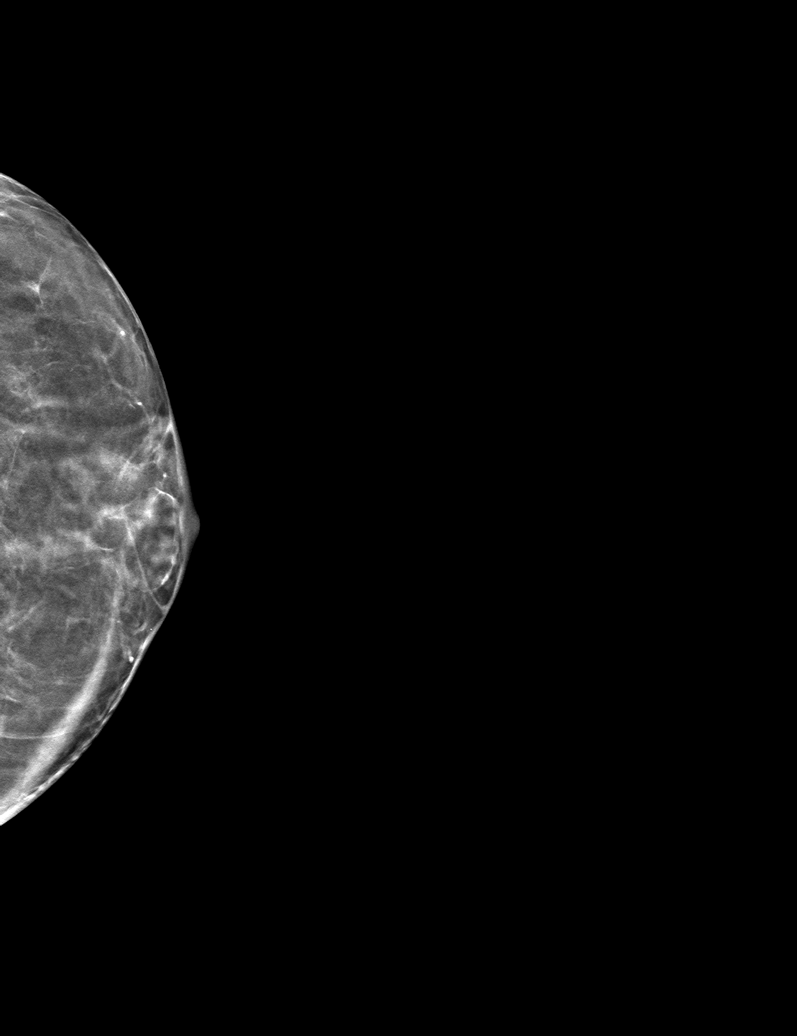

[6 of 14 positions shown; findings below may reference images not displayed]

ACR Breast Density Category b: There are scattered areas of
fibroglandular density.
FINDINGS: There are no findings suspicious for malignancy. Images were
processed with CAD.
IMPRESSION: No mammographic evidence of malignancy. A result letter of this
screening mammogram will be mailed directly to the patient.

RECOMMENDATION:
Screening mammogram in one year. (Code:[id number])

BI-RADS CATEGORY  1:  Negative.

## 2020-08-25 ENCOUNTER — Other Ambulatory Visit (HOSPITAL_COMMUNITY): Payer: Self-pay | Admitting: *Deleted

## 2020-08-26 ENCOUNTER — Encounter (HOSPITAL_COMMUNITY)
Admission: RE | Admit: 2020-08-26 | Discharge: 2020-08-26 | Disposition: A | Discharge status: Home / Self Care | Payer: BC Managed Care – PPO | Source: Ambulatory Visit | Attending: Internal Medicine | Admitting: Internal Medicine

## 2020-08-26 DIAGNOSIS — M810 Age-related osteoporosis without current pathological fracture: Secondary | ICD-10-CM | POA: Diagnosis present

## 2020-08-26 MED ORDER — ZOLEDRONIC ACID 5 MG/100ML IV SOLN: 5.0000 mg | Freq: Once | INTRAVENOUS | Status: AC

## 2020-08-26 MED ORDER — ZOLEDRONIC ACID 5 MG/100ML IV SOLN
INTRAVENOUS | Status: AC
  Administered 2020-08-26: 5 mg via INTRAVENOUS
  Filled 2020-08-26: qty 100

## 2020-10-07 ENCOUNTER — Other Ambulatory Visit: Payer: Self-pay | Admitting: Nurse Practitioner

## 2020-10-07 ENCOUNTER — Other Ambulatory Visit: Payer: Self-pay | Admitting: Adult Health

## 2020-10-07 NOTE — Telephone Encounter (Signed)
87 yo Last seen in office 04/15/2019 for AEX w [place].

## 2020-11-13 ENCOUNTER — Other Ambulatory Visit: Payer: BC Managed Care – PPO

## 2020-12-01 ENCOUNTER — Other Ambulatory Visit: Payer: Self-pay | Admitting: Adult Health

## 2020-12-07 ENCOUNTER — Ambulatory Visit (INDEPENDENT_AMBULATORY_CARE_PROVIDER_SITE_OTHER): Payer: BC Managed Care – PPO | Admitting: Adult Health

## 2020-12-07 ENCOUNTER — Encounter: Payer: Self-pay | Admitting: Adult Health

## 2020-12-07 VITALS — BP 132/76 | HR 77 | Ht 63.0 in | Wt 120.6 lb

## 2020-12-07 DIAGNOSIS — R413 Other amnesia: Secondary | ICD-10-CM | POA: Diagnosis not present

## 2020-12-07 DIAGNOSIS — G20 Parkinson's disease: Secondary | ICD-10-CM | POA: Diagnosis not present

## 2020-12-07 NOTE — Progress Notes (Signed)
PATIENT: Michelle Sims DOB: [birth year]  REASON FOR VISIT: follow up HISTORY FROM: patient  HISTORY OF PRESENT ILLNESS: Today 12/07/20:  Michelle Sims is an 87 year old female with a history of parkinsonism.  She returns today for follow-up.  Overall the patient feels that her symptoms have progressed.  She has noticed more trouble with her walking.  Specifically when she stands she has a hard time getting started.  Feels that her tremor in the legs have gotten worse.  Denies any trouble swallowing food or liquids.  Reports that she continues to have trouble sleeping.  This has been an ongoing issue.  Denies any changes in her mood or behavior.  States that her PCP started her on Lexapro 10 mg daily.  Husband has noticed the benefit.  She did neurologist at [facility] in June for second opinion.  They suggested that she increased Sinemet to 1-1/2 tablets for the first 2 doses and continue 1 tablet for the last 2 doses.  The patient and her husband has not noticed the benefit.  She also notes trouble with her memory.  Reports that she is forgetful.  She returns today for an evaluation.   05/26/20: Michelle Sims is an 87 year old female with a history of parkinsonism.  She returns today for follow-up.  She reports that her tremor is only on occasion.  She uses a walker when ambulating.  Denies any recent falls.  Reports that her gait is slow but no other changes.  Reports good appetite.  States that she naps a lot during the day subsequently does not sleep well at night.  She tried increasing Exelon to 13.3 mg but this caused nausea.  She is now back on 9.5 mg.  She continues Sinemet 1 tablet 4 times a day and Sinemet CR at bedtime.  The patient does feel that she is depressed.  Reports most days she has no desire to do activities.  She stays in bed a lot.  Has not discussed her depression with her PCP.  She has a second opinion regarding her parkinsonism June 1 at [facility].  Patient's husband states  that they are not working to change practices just want a second opinion.  HISTORY 02/26/2020: She reports feeling more off balance, she has fallen, thankfully without major injuries, she typically falls when she does not use her walker.  She has a 2 wheeled walker and does not always use it inside the house.  She has ongoing issues with fatigue.  Her husband is concerned about more forgetfulness.  He is wondering if she can increase the Exelon patch.  She is currently on 9.5 mg daily and is able to tolerate this.  Appetite is good, he tries to encourage her to drink more water.  She may not always do well enough.  She does struggle with constipation from time to time and uses MiraLAX as needed.  She also tries to eat prunes for more natural approach to constipation management.  She has been followed by Dr. Mervyn Skeeters at [name] in [place].  She also had a consultation via telemedicine with Dr. Ida Rogue for cognitive evaluation and I reviewed the telemedicine visit note from 02/20/2020.   REVIEW OF SYSTEMS: Out of a complete 14 system review of symptoms, the patient complains only of the following symptoms, and all other reviewed systems are negative.  ALLERGIES: Allergies  Allergen Reactions   Sulfa Antibiotics Rash   Biaxin [Clarithromycin] Other (See Comments)    Pt does  not remember reaction    Ciprofloxacin Nausea And Vomiting   Hctz [Hydrochlorothiazide] Other (See Comments)    weakness and hyponatremia   Macrobid [Nitrofurantoin] Nausea Only   Other Nausea Only   Prednisone Other (See Comments)    Swelling and facial redness    Sulfamethoxazole-Trimethoprim Other (See Comments) and Rash    Sore throat    HOME MEDICATIONS: Outpatient Medications Prior to Visit  Medication Sig Dispense Refill   amLODipine (NORVASC) 2.5 MG tablet Take 2.5 mg by mouth daily.     Ascorbic Acid (VITAMIN C PO) Take by mouth.     aspirin EC 81 MG tablet Take 81 mg by mouth daily.     Calcium  Carb-Cholecalciferol (CALCIUM 600 + D PO) Take 1 tablet by mouth daily with lunch. (Patient not taking: Reported on 07/22/2020)     carbidopa-levodopa (SINEMET IR) 25-100 MG tablet Take by mouth. 1 in am, 1 at 11a, 1 at 3p and 1 at 8p     Carbidopa-Levodopa ER (SINEMET CR) 25-100 MG tablet controlled release TAKE 1 TABLET BY MOUTH EVERYDAY AT BEDTIME 90 tablet 1   cholecalciferol (VITAMIN D) 1000 units tablet Take 2,000 Units by mouth daily.     co-enzyme Q-10 30 MG capsule Take 30 mg by mouth daily.     Cranberry 300 MG tablet Take 300 mg by mouth every other day.     escitalopram (LEXAPRO) 10 MG tablet Take 10 mg by mouth daily.     irbesartan (AVAPRO) 150 MG tablet Take 150 mg by mouth at bedtime.     levothyroxine (SYNTHROID, LEVOTHROID) 112 MCG tablet Take 112 mcg by mouth daily before breakfast.     MYRBETRIQ 25 MG TB24 tablet Take 25 mg by mouth daily.  1   NONFORMULARY OR COMPOUNDED ITEM Estradiol vag cream 0.02% (0.2mg /ml)  S:  Insert 1 ml (1 syringe) into the vagina hs 2 times weekly as directed. 24 each 4   raloxifene (EVISTA) 60 MG tablet TAKE 1 TABLET BY MOUTH EVERY DAY 90 tablet 1   rivastigmine (EXELON) 9.5 mg/24hr PLACE 1 PATCH ONTO THE SKIN DAILY. 90 patch 0   No facility-administered medications prior to visit.    PAST MEDICAL HISTORY: Past Medical History:  Diagnosis Date   Anxiety    Arthritis    knee & back    Blood type, Rh negative    Breast cancer (HCC)    Chronic fatigue    Depression    Diverticulosis    Endocervical polyp    GERD (gastroesophageal reflux disease)    Hypertension    Hypothyroidism    IBS (irritable bowel syndrome)    Insomnia    Lymphedema    Neuromuscular disorder ([place])    Parkinson's disease    Osteopenia 06/2015   T score -2.4 distal third of the radius   Parkinson disease (HCC)    RBBB 10/07/2013   Thyroid cancer ([place])     PAST SURGICAL HISTORY: Past Surgical History:  Procedure Laterality Date   APPENDECTOMY  1979   BREAST  SURGERY  1979   MASTECTOMY AND RECONSTRUCTION   DILATION AND CURETTAGE OF UTERUS     MISCARRIAGE   EYE SURGERY Bilateral    w/ IOL   HYSTEROSCOPY  2005,2009   AND D&C   MASTECTOMY Bilateral    THYROIDECTOMY     TONSILLECTOMY     TOTAL KNEE ARTHROPLASTY Right 10/07/2013   Procedure: RIGHT TOTAL KNEE ARTHROPLASTY;  Surgeon: Kerin Salen, MD;  Location: [place];  Service: Orthopedics;  Laterality: Right;   TUBAL LIGATION      FAMILY HISTORY: Family History  Problem Relation Age of Onset   Hypertension Mother    Hyperlipidemia Mother    Heart failure Mother    Hypertension Father    Hyperlipidemia Father    Heart failure Father    Hyperlipidemia Brother    Hypertension Brother    Heart attack Brother    Breast cancer Neg Hx     SOCIAL HISTORY: Social History   Socioeconomic History   Marital status: Married    Spouse name: Not on file   Number of children: Not on file   Years of education: Not on file   Highest education level: Not on file  Occupational History   Occupation: retired    Comment: "odd jobs"  Tobacco Use   Smoking status: Never   Smokeless tobacco: Never  Vaping Use   Vaping Use: Never used  Substance and Sexual Activity   Alcohol use: Yes    Alcohol/week: 0.0 standard drinks    Comment: rare   Drug use: No   Sexual activity: Never    Birth control/protection: Surgical, Post-menopausal    Comment: 1st intercourse 87 yo-Fewer than 5 partners  Other Topics Concern   Not on file  Social History Narrative   Not on file   Social Determinants of Health   Financial Resource Strain: Not on file  Food Insecurity: Not on file  Transportation Needs: Not on file  Physical Activity: Not on file  Stress: Not on file  Social Connections: Not on file  Intimate Partner Violence: Not on file      PHYSICAL EXAM  Vitals:   12/07/20 1316  BP: 132/76  Pulse: 77  Weight: 120 lb 9.6 oz (54.7 kg)  Height: 5\' 3"  (1.6 m)    Body mass index is 21.36  kg/m.  MMSE - Mini Mental State Exam 12/07/2020 06/01/2020 04/28/2017  Not completed: - - Refused  Orientation to time 2 5 -  Orientation to Place 5 5 -  Registration 3 3 -  Attention/ Calculation 5 5 -  Recall 2 2 -  Language- name 2 objects 2 2 -  Language- repeat 1 1 -  Language- follow 3 step command 2 3 -  Language- read & follow direction 1 1 -  Write a sentence 0 0 -  Copy design 0 0 -  Total score 23 27 -     Generalized: Well developed, in no acute distress   Neurological examination  Mentation: Alert oriented to time, place, history taking. Follows all commands speech and language fluent Cranial nerve II-XII: Pupils were equal round reactive to light. Extraocular movements were full, visual field were full on confrontational test. Facial sensation and strength were normal. Uvula tongue midline. Head turning and shoulder shrug  were normal and symmetric. Motor: The motor testing reveals 5 over 5 strength of all 4 extremities. No tremor noted.  Moderate impairment of finger taps bilaterally.  Mild to moderate impairment of toe taps bilaterally Sensory: Sensory testing is intact to soft touch on all 4 extremities. No evidence of extinction is noted.  Coordination: Cerebellar testing reveals good finger-nose-finger and heel-to-shin bilaterally.  Gait and station: Uses a walker when ambulating.  Needs assistance with standing gait is slow and cautious.  3-4 steps with turns Reflexes: Deep tendon reflexes are symmetric and normal bilaterally.   DIAGNOSTIC DATA (LABS, IMAGING, TESTING) - I reviewed patient records,  labs, notes, testing and imaging myself where available.  Lab Results  Component Value Date   WBC 9.4 09/03/2019   HGB 13.0 09/03/2019   HCT 40.0 09/03/2019   MCV 98.5 09/03/2019   PLT 186 09/03/2019      Component Value Date/Time   NA 138 09/03/2019 1515   NA 139 10/15/2013 0000   K 4.2 09/03/2019 1515   CL 106 09/03/2019 1515   CO2 24 09/03/2019 1515    GLUCOSE 96 09/03/2019 1515   BUN 25 (H) 09/03/2019 1515   BUN 18 10/15/2013 0000   CREATININE 0.69 09/03/2019 1515   CALCIUM 8.5 (L) 09/03/2019 1515   PROT 6.8 09/03/2019 1515   ALBUMIN 3.9 09/03/2019 1515   AST 16 09/03/2019 1515   ALT 6 09/03/2019 1515   ALKPHOS 67 09/03/2019 1515   BILITOT 0.8 09/03/2019 1515   GFRNONAA >60 09/03/2019 1515   GFRAA >60 09/03/2019 1515      ASSESSMENT AND PLAN 87 y.o. year old female  has a past medical history of Anxiety, Arthritis, Blood type, Rh negative, Breast cancer ([place]), Chronic fatigue, Depression, Diverticulosis, Endocervical polyp, GERD (gastroesophageal reflux disease), Hypertension, Hypothyroidism, IBS (irritable bowel syndrome), Insomnia, Lymphedema, Neuromuscular disorder ([place]), Osteopenia (06/2015), Parkinson disease ([place]), RBBB (10/07/2013), and Thyroid cancer ([place]). here with:  1.  Primary parkinsonism  --Continue Sinemet 25-100 mg 4 times a day.  Advised it was okay to go back to 1 tablet versus 1-1/2 -- Continue Sinemet CR 25-100 mg at bedtime  2.  Memory disturbance  --Continue Exelon patch 9.5  mg every 24 hours -- MMSE 23/30  Follow-up in 6 months or sooner if needed Advised if symptoms worsen or she develops new symptoms she should let us know.     Ward Givens, MSN, NP-C 12/07/2020, 1:15 PM [facility] [address] [place], [place] [address] [phone]

## 2020-12-16 ENCOUNTER — Encounter: Payer: Self-pay | Admitting: Internal Medicine

## 2020-12-16 ENCOUNTER — Non-Acute Institutional Stay: Payer: BC Managed Care – PPO | Admitting: Internal Medicine

## 2020-12-16 ENCOUNTER — Other Ambulatory Visit: Payer: Self-pay

## 2020-12-16 VITALS — BP 108/78 | HR 85 | Temp 96.8°F | Ht 63.0 in | Wt 119.8 lb

## 2020-12-16 DIAGNOSIS — E890 Postprocedural hypothyroidism: Secondary | ICD-10-CM

## 2020-12-16 DIAGNOSIS — I10 Essential (primary) hypertension: Secondary | ICD-10-CM

## 2020-12-16 DIAGNOSIS — R4189 Other symptoms and signs involving cognitive functions and awareness: Secondary | ICD-10-CM | POA: Diagnosis not present

## 2020-12-16 DIAGNOSIS — F339 Major depressive disorder, recurrent, unspecified: Secondary | ICD-10-CM

## 2020-12-16 DIAGNOSIS — M810 Age-related osteoporosis without current pathological fracture: Secondary | ICD-10-CM

## 2020-12-16 DIAGNOSIS — G20 Parkinson's disease: Secondary | ICD-10-CM | POA: Diagnosis not present

## 2020-12-16 DIAGNOSIS — R5382 Chronic fatigue, unspecified: Secondary | ICD-10-CM

## 2020-12-16 DIAGNOSIS — N3946 Mixed incontinence: Secondary | ICD-10-CM

## 2020-12-16 NOTE — Patient Instructions (Signed)
Start Boost slowly may be twice a week days that you don't eat well Let me know if you want therapy to come and work with you Get BP checked once every 2 weeks and bring it next appointment

## 2020-12-20 NOTE — Progress Notes (Signed)
Location:  [facility] of Service:  Clinic (12)  Provider:   Code Status:  Goals of Care:  Advanced Directives 07/22/2020  Does Patient Have a Medical Advance Directive? No  Type of Advance Directive -  Does patient want to make changes to medical advance directive? -  Copy of [place] in Chart? -  Would patient like information on creating a medical advance directive? No - Patient declined  Pre-existing out of facility DNR order (yellow form or pink MOST form) -     Chief Complaint  Patient presents with   Medical Management of Chronic Issues    Patient returns to the clinic to discuss Parkinsons.     HPI: Patient is a 87 y.o. female seen today for medical management of chronic diseases.    Patient has h/o Parkinson disease, hypertension hypothyroidism, history of breast cancer s/p mastectomy, history of chronic back pain.   Patient lives with her husband in the [place]   Patient sees Dr. Rexene Alberts for her [place].  Has seen [name] before.  Has seen Dr. Mervyn Skeeters at [facility] Patient has had extensive work-up   Parkinson Was seen by Dr Jenene Slicker in [facility]. Per her notes patient has definite diagnosis of Parkinson disease.  She recommended increasing the carbidopa/levodopa to 1.5 tablets for the first 2 doses and then eventually increased to 1.5 for all 4 doses  He made the change initially but did not seem to help so went back to original dose Continues to miss her sinemet doses sometimes.  She states she does not see any change.  She is doing well and walking with her walker but continues to have issues with fatigue.  No falls.  No dysphagia no dizziness.  States independent in her ADLs. Cognitive impairment  On Exelon Patch [per Neuro Depression Her mood seems better today.  Is taking Lexapro. Urinary incontinence Follows with urology Dr. Zigmund Daniel was started on DDAVP for Nocturia.  Her sodium is stable on the repeat  BMP. She also follows with Dr. Delfina Redwood in [place] as PCP for her other needs  Weight loss Has lost almost 10 lbs in past 6 months She says she has good Appetite Osteoporosis Seen by Dr Buddy Duty and gets Reclast in 8/22  Past Medical History:  Diagnosis Date   Anxiety    Arthritis    knee & back    Blood type, Rh negative    Breast cancer (HCC)    Chronic fatigue    Depression    Diverticulosis    Endocervical polyp    GERD (gastroesophageal reflux disease)    Hypertension    Hypothyroidism    IBS (irritable bowel syndrome)    Insomnia    Lymphedema    Neuromuscular disorder ([place])    Parkinson's disease    Osteopenia 06/2015   T score -2.4 distal third of the radius   Parkinson disease ([place])    RBBB 10/07/2013   Thyroid cancer ([place])     Past Surgical History:  Procedure Laterality Date   APPENDECTOMY  1979   BREAST SURGERY  1979   MASTECTOMY AND RECONSTRUCTION   DILATION AND CURETTAGE OF UTERUS     MISCARRIAGE   EYE SURGERY Bilateral    w/ IOL   HYSTEROSCOPY  2005,2009   AND D&C   MASTECTOMY Bilateral    THYROIDECTOMY     TONSILLECTOMY     TOTAL KNEE ARTHROPLASTY Right 10/07/2013   Procedure: RIGHT TOTAL KNEE  ARTHROPLASTY;  Surgeon: Kerin Salen, MD;  Location: [facility];  Service: Orthopedics;  Laterality: Right;   TUBAL LIGATION      Allergies  Allergen Reactions   Sulfa Antibiotics Rash   Biaxin [Clarithromycin] Other (See Comments)    Pt does not remember reaction    Ciprofloxacin Nausea And Vomiting   Hctz [Hydrochlorothiazide] Other (See Comments)    weakness and hyponatremia   Macrobid [Nitrofurantoin] Nausea Only   Other Nausea Only   Prednisone Other (See Comments)    Swelling and facial redness    Sulfamethoxazole-Trimethoprim Other (See Comments) and Rash    Sore throat    Outpatient Encounter Medications as of 12/16/2020  Medication Sig   amLODipine (NORVASC) 2.5 MG tablet Take 2.5 mg by mouth daily.   Ascorbic Acid (VITAMIN C PO) Take by  mouth.   aspirin EC 81 MG tablet Take 81 mg by mouth daily.   carbidopa-levodopa (SINEMET IR) 25-100 MG tablet Take by mouth. 1 in am, 1 at 11a, 1 at 3p and 1 at 8p   Carbidopa-Levodopa ER (SINEMET CR) 25-100 MG tablet controlled release TAKE 1 TABLET BY MOUTH EVERYDAY AT BEDTIME   cholecalciferol (VITAMIN D) 1000 units tablet Take 2,000 Units by mouth daily.   co-enzyme Q-10 30 MG capsule Take 30 mg by mouth daily.   Cranberry 300 MG tablet Take 300 mg by mouth every other day.   desmopressin (DDAVP) 0.2 MG tablet Take 0.2 mg by mouth daily.   escitalopram (LEXAPRO) 10 MG tablet Take 10 mg by mouth daily.   irbesartan (AVAPRO) 150 MG tablet Take 150 mg by mouth at bedtime.   levothyroxine (SYNTHROID, LEVOTHROID) 112 MCG tablet Take 112 mcg by mouth daily before breakfast.   MYRBETRIQ 25 MG TB24 tablet Take 25 mg by mouth daily.   NONFORMULARY OR COMPOUNDED ITEM Estradiol vag cream 0.02% (0.2mg /ml)  S:  Insert 1 ml (1 syringe) into the vagina hs 2 times weekly as directed.   rivastigmine (EXELON) 9.5 mg/24hr PLACE 1 PATCH ONTO THE SKIN DAILY.   [DISCONTINUED] Calcium Carb-Cholecalciferol (CALCIUM 600 + D PO) Take 1 tablet by mouth daily with lunch.   [DISCONTINUED] raloxifene (EVISTA) 60 MG tablet TAKE 1 TABLET BY MOUTH EVERY DAY   No facility-administered encounter medications on file as of 12/16/2020.    Review of Systems:  Review of Systems  Constitutional:  Positive for unexpected weight change.  HENT: Negative.    Respiratory: Negative.    Cardiovascular: Negative.   Gastrointestinal: Negative.   Genitourinary:  Positive for frequency.  Musculoskeletal:  Positive for gait problem.  Skin: Negative.   Neurological:  Positive for weakness.  Psychiatric/Behavioral:  Positive for confusion and dysphoric mood.   All other systems reviewed and are negative.  Health Maintenance  Topic Date Due   Zoster Vaccines- Shingrix (1 of 2) 03/09/1948   PAP SMEAR-Modifier  03/15/2013    TETANUS/TDAP  03/11/2020   COVID-19 Vaccine (3 - Moderna risk series) 12/04/2020   Pneumonia Vaccine 46+ Years old  Completed   INFLUENZA VACCINE  Completed   DEXA SCAN  Completed   HPV VACCINES  Aged Out    Physical Exam: Vitals:   12/16/20 1054  BP: 108/78  Pulse: 85  Temp: (!) 96.8 F (36 C)  SpO2: 99%  Weight: 119 lb 12.8 oz (54.3 kg)  Height: 5\' 3"  (1.6 m)   Body mass index is 21.22 kg/m. Physical Exam Constitutional: Oriented to person, place, and time. Well-developed and well-nourished.  HENT:  Head: Normocephalic.  Mouth/Throat: Oropharynx is clear and moist.  Eyes: Pupils are equal, round, and reactive to light.  Neck: Neck supple.  Cardiovascular: Normal rate and normal heart sounds.  No murmur heard. Pulmonary/Chest: Effort normal and breath sounds normal. No respiratory distress. No wheezes. She has no rales.  Abdominal: Soft. Bowel sounds are normal. No distension. There is no tenderness. There is no rebound.  Musculoskeletal: No edema.  Lymphadenopathy: none Neurological: Alert and oriented to person, place, and time.  Gait was good with her walker No Tremors Noticed Skin: Skin is warm and dry.  Psychiatric: Normal mood and affect. Behavior is normal. Thought content normal.   Labs reviewed: Basic Metabolic Panel: No results for input(s): NA, K, CL, CO2, GLUCOSE, BUN, CREATININE, CALCIUM, MG, PHOS, TSH in the last 8760 hours. Liver Function Tests: No results for input(s): AST, ALT, ALKPHOS, BILITOT, PROT, ALBUMIN in the last 8760 hours. No results for input(s): LIPASE, AMYLASE in the last 8760 hours. No results for input(s): AMMONIA in the last 8760 hours. CBC: No results for input(s): WBC, NEUTROABS, HGB, HCT, MCV, PLT in the last 8760 hours. Lipid Panel: No results for input(s): CHOL, HDL, LDLCALC, TRIG, CHOLHDL, LDLDIRECT in the last 8760 hours. Lab Results  Component Value Date   HGBA1C 5.6 10/15/2013    Procedures since last visit: No  results found.  Assessment/Plan Parkinson's disease (tremor, stiffness, slow motion, unstable posture) (HCC) Continue to be non compliant with her Sinemet as she says it does not help her Follows with Neurology Refusing Therapy eval again for Gait strengthening  Cognitive impairment MMSE 23/30 in Neurology On Exelon Patch  Depression, recurrent ([place]) Doing well on Lexapro Postoperative hypothyroidism TSH follows with Dr Delfina Redwood in [place] Primary hypertension BP was low here Will get it  checked by Facility nurse bring for follow up nest visit Chronic fatigue Continues to be issue Mixed stress and urge urinary incontinence Follows with Urology Onb DDAVP and Myrbetriq  Age-related osteoporosis without current pathological fracture Reclast per dr Buddy Duty   Labs/tests ordered:  * No order type specified * Next appt:  04/14/2021

## 2021-01-19 ENCOUNTER — Telehealth: Payer: Self-pay | Admitting: Adult Health

## 2021-01-19 NOTE — Telephone Encounter (Signed)
I called husband of pt.  Pt has had issues with Bp and meds lowered to assist with this.  She also had falled x 3 about 2 wks ago (in one day).  Diagnosed with UTI treated with ceftin.  Had episodes of trembling/ frozen spells, quivering legs, needing assistance with transfers.  Today pcp is back and is trying to get is pt needs another ABX.   Today her PD sx are better.  She is doing much better.  I relayed that if has infection could affect how she feels relating to her PD sx.  He will call back if needed but for now this was a FYI.  I relayed that MM/NP and SA/MD out this week.  He appreciated call back.

## 2021-01-19 NOTE — Telephone Encounter (Signed)
Pt's husband, Felesha Moncrieffe (on [place]) called, Parkinson's symptom are worsening; mobility decreasing, legs shaking, difficulty walking. Would like a call from the nurse.

## 2021-02-11 NOTE — Telephone Encounter (Signed)
Pt's husband is asking for a call from [place], [place] to discuss pt worsening.  Husband states pt's legs will lock up on her can cause her to not be able to move.  Please call husband on [phone]

## 2021-02-11 NOTE — Telephone Encounter (Signed)
I called husband.  Last 6 wks noted worsening of freezing episodes (greater at night and in morning).  Husband has to assist up to BR (getting OOB).  Had one fall, [facility] and was ok, no injury.  -Just finished ceftin then keflex (2 days ago).  Taking CL 25/100 1 qid (8-11-3-8p) and 25/100 CR at bedtime 9-10pm.  Were seen 12/07/2020.  (Noted some same issues then).  ? If infection caused more episodes. Recommendations?

## 2021-02-11 NOTE — Telephone Encounter (Signed)
I called pts husband I relayed that per MM/NP infection could exacerbate sx.  Monitor sx , hydrate more. Let us know how she does next week.  Hopefully she will be better.  He appreciated call back.  Will let us know.

## 2021-02-22 ENCOUNTER — Other Ambulatory Visit: Payer: Self-pay

## 2021-02-22 ENCOUNTER — Encounter (HOSPITAL_BASED_OUTPATIENT_CLINIC_OR_DEPARTMENT_OTHER): Payer: Self-pay | Admitting: Urology

## 2021-02-22 ENCOUNTER — Emergency Department (HOSPITAL_BASED_OUTPATIENT_CLINIC_OR_DEPARTMENT_OTHER)
Admission: EM | Admit: 2021-02-22 | Discharge: 2021-02-22 | Disposition: A | Discharge status: Home / Self Care | Payer: BC Managed Care – PPO | Attending: Emergency Medicine | Admitting: Emergency Medicine

## 2021-02-22 DIAGNOSIS — Z7982 Long term (current) use of aspirin: Secondary | ICD-10-CM | POA: Insufficient documentation

## 2021-02-22 DIAGNOSIS — R531 Weakness: Secondary | ICD-10-CM

## 2021-02-22 DIAGNOSIS — Z79899 Other long term (current) drug therapy: Secondary | ICD-10-CM | POA: Insufficient documentation

## 2021-02-22 DIAGNOSIS — R296 Repeated falls: Secondary | ICD-10-CM | POA: Diagnosis not present

## 2021-02-22 DIAGNOSIS — M6281 Muscle weakness (generalized): Secondary | ICD-10-CM | POA: Insufficient documentation

## 2021-02-22 LAB — CBC
HCT: 39.7 % (ref 36.0–46.0)
Hemoglobin: 12.7 g/dL (ref 12.0–15.0)
MCH: 30.8 pg (ref 26.0–34.0)
MCHC: 32 g/dL (ref 30.0–36.0)
MCV: 96.4 fL (ref 80.0–100.0)
Platelets: 212 10*3/uL (ref 150–400)
RBC: 4.12 MIL/uL (ref 3.87–5.11)
RDW: 13.5 % (ref 11.5–15.5)
WBC: 5.6 10*3/uL (ref 4.0–10.5)
nRBC: 0 % (ref 0.0–0.2)

## 2021-02-22 LAB — URINALYSIS, ROUTINE W REFLEX MICROSCOPIC
Bilirubin Urine: NEGATIVE
Glucose, UA: NEGATIVE mg/dL
Hgb urine dipstick: NEGATIVE
Leukocytes,Ua: NEGATIVE
Nitrite: NEGATIVE
Protein, ur: 30 mg/dL — AB
Specific Gravity, Urine: 1.026 (ref 1.005–1.030)
pH: 6 (ref 5.0–8.0)

## 2021-02-22 LAB — BASIC METABOLIC PANEL
Anion gap: 7 (ref 5–15)
BUN: 22 mg/dL (ref 8–23)
CO2: 28 mmol/L (ref 22–32)
Calcium: 8.8 mg/dL — ABNORMAL LOW (ref 8.9–10.3)
Chloride: 102 mmol/L (ref 98–111)
Creatinine, Ser: 0.91 mg/dL (ref 0.44–1.00)
GFR, Estimated: >60 mL/min (ref >60)
Glucose, Bld: 170 mg/dL — ABNORMAL HIGH (ref 70–99)
Potassium: 3.9 mmol/L (ref 3.5–5.1)
Sodium: 137 mmol/L (ref 135–145)

## 2021-02-22 NOTE — ED Notes (Signed)
Dc instructions reviewed with pt no questions or concerns at this time. Will follow up with pcp 

## 2021-02-22 NOTE — Discharge Instructions (Addendum)
Please read and follow all provided instructions.  Your diagnoses today include:  1. Generalized weakness     Tests performed today include: Blood counts and electrolytes Urine testing: No signs of infection today Vital signs. See below for your results today.   Medications prescribed:  None  Take any prescribed medications only as directed.  Home care instructions:  Follow any educational materials contained in this packet.  BE VERY CAREFUL not to take multiple medicines containing Tylenol (also called acetaminophen). Doing so can lead to an overdose which can damage your liver and cause liver failure and possibly death.   Follow-up instructions: Please follow-up with your primary care provider as needed for further evaluation of your symptoms.   Return instructions:  Please return to the Emergency Department if you experience worsening symptoms.  Please return if you have any other emergent concerns.  Additional Information:  Your vital signs today were: BP (!) 179/99    Pulse 68    Temp 98.4 F (36.9 C) (Oral)    Resp 16    Ht 5\' 3"  (1.6 m)    Wt 54.3 kg    SpO2 100%    BMI 21.21 kg/m  If your blood pressure (BP) was elevated above 135/85 this visit, please have this repeated by your doctor within one month. --------------

## 2021-02-22 NOTE — ED Triage Notes (Signed)
Per EMS multiple falls over past 2 days with generalized weakness,  Fall with gradual fall to ground denies hitting head, no injury today Sent by facility to evaluation H/o parkinsons, 2 UTIs recently  NAD now, A&O x 4

## 2021-02-22 NOTE — ED Provider Notes (Signed)
[facility] EMERGENCY DEPT Provider Note   CSN: [id number] Arrival date & time: 02/22/21  1222     History  Chief Complaint  Patient presents with   muliple falls   Weakness    Michelle Sims is a 88 y.o. female.  Patient with history of Parkinson's disease, right bundle branch block frequent urinary tract infections--presents to the emergency department after a fall earlier today.  Patient states that Michelle Sims woke up and felt well.  Michelle Sims had a good breakfast.  Michelle Sims was trying to stand up and felt weak.  Michelle Sims slumped to the ground, assisted by her husband.  Michelle Sims grazed the side of her head but did not hit hard.  Michelle Sims did not lose consciousness.  Michelle Sims did not have associated chest pain or shortness of breath.  After discussion with the nurse where they live, was decided that Michelle Sims best be evaluated for the weakness that Michelle Sims is feeling.  Currently Michelle Sims feels improved, close to baseline. Patient denies signs of stroke including: facial droop, slurred speech, aphasia, weakness/numbness in extremities, imbalance/trouble walking.  Michelle Sims denies URI symptoms or fever.  No headaches, vomiting, diarrhea.  Currently no urinary symptoms, although Michelle Sims states that sometimes Michelle Sims has UTIs without symptoms.  Was recently on Septra and then Keflex per her husband.  Patient does report that Michelle Sims did not really want to come to the emergency department for this but was convinced after discussion to be checked.      Home Medications Prior to Admission medications   Medication Sig Start Date End Date Taking? Authorizing Provider  amLODipine (NORVASC) 2.5 MG tablet Take 2.5 mg by mouth daily. 06/13/16   [provider]  Ascorbic Acid (VITAMIN C PO) Take by mouth.    [provider]  aspirin EC 81 MG tablet Take 81 mg by mouth daily.    [provider]  carbidopa-levodopa (SINEMET IR) 25-100 MG tablet Take by mouth. 1 in am, 1 at 11a, 1 at 3p and 1 at 8p    [provider]   Carbidopa-Levodopa ER (SINEMET CR) 25-100 MG tablet controlled release TAKE 1 TABLET BY MOUTH EVERYDAY AT BEDTIME 05/30/18   Star Age, MD  cholecalciferol (VITAMIN D) 1000 units tablet Take 2,000 Units by mouth daily.    [provider]  co-enzyme Q-10 30 MG capsule Take 30 mg by mouth daily.    [provider]  Cranberry 300 MG tablet Take 300 mg by mouth every other day.    [provider]  desmopressin (DDAVP) 0.2 MG tablet Take 0.2 mg by mouth daily.    [provider]  escitalopram (LEXAPRO) 10 MG tablet Take 10 mg by mouth daily.    [provider]  irbesartan (AVAPRO) 150 MG tablet Take 150 mg by mouth at bedtime. 11/09/10   [provider]  levothyroxine (SYNTHROID, LEVOTHROID) 112 MCG tablet Take 112 mcg by mouth daily before breakfast.    [provider]  MYRBETRIQ 25 MG TB24 tablet Take 25 mg by mouth daily. 03/24/17   [provider]  NONFORMULARY OR COMPOUNDED ITEM Estradiol vag cream 0.02% (0.2mg /ml)  S:  Insert 1 ml (1 syringe) into the vagina hs 2 times weekly as directed. 05/20/19   Huel Cote, NP  rivastigmine (EXELON) 9.5 mg/24hr PLACE 1 PATCH ONTO THE SKIN DAILY. 12/02/20   Ward Givens, NP      Allergies    Sulfa antibiotics, Biaxin [clarithromycin], Ciprofloxacin, Hctz [hydrochlorothiazide], Macrobid [nitrofurantoin], Other, Prednisone, and Sulfamethoxazole-trimethoprim  Review of Systems   Review of Systems  Physical Exam Updated Vital Signs BP (!) 173/99    Pulse 70    Temp 98.4 F (36.9 C) (Oral)    Resp 16    Ht 5\' 3"  (1.6 m)    Wt 54.3 kg    SpO2 98%    BMI 21.21 kg/m  Physical Exam Vitals and nursing note reviewed.  Constitutional:      General: Michelle Sims is not in acute distress.    Appearance: Michelle Sims is well-developed.  HENT:     Head: Normocephalic and atraumatic.     Right Ear: External ear normal.     Left Ear: External ear normal.     Nose: Nose normal.  Eyes:      Conjunctiva/sclera: Conjunctivae normal.  Cardiovascular:     Rate and Rhythm: Normal rate and regular rhythm.     Heart sounds: No murmur heard. Pulmonary:     Effort: No respiratory distress.     Breath sounds: No wheezing, rhonchi or rales.  Abdominal:     Palpations: Abdomen is soft.     Tenderness: There is no abdominal tenderness. There is no guarding or rebound.  Musculoskeletal:     Cervical back: Normal range of motion and neck supple.     Right lower leg: No edema.     Left lower leg: No edema.  Skin:    General: Skin is warm and dry.     Findings: No rash.  Neurological:     General: No focal deficit present.     Mental Status: Michelle Sims is alert. Mental status is at baseline.     Motor: No weakness.  Psychiatric:        Mood and Affect: Mood normal.    ED Results / Procedures / Treatments   Labs (all labs ordered are listed, but only abnormal results are displayed) Labs Reviewed  BASIC METABOLIC PANEL - Abnormal; Notable for the following components:      Result Value   Glucose, Bld 170 (*)    Calcium 8.8 (*)    All other components within normal limits  URINALYSIS, ROUTINE W REFLEX MICROSCOPIC - Abnormal; Notable for the following components:   Ketones, ur TRACE (*)    Protein, ur 30 (*)    All other components within normal limits  CBC    ED ECG REPORT   Date: 02/22/2021  Rate: 79  Rhythm: normal sinus rhythm  QRS Axis: left  Intervals: normal  ST/T Wave abnormalities: nonspecific T wave changes  Conduction Disutrbances:right bundle branch block  Narrative Interpretation:   Old EKG Reviewed: from 2015, lateral t-wave inversions now present  I have personally reviewed the EKG tracing and agree with the computerized printout as noted.   Radiology No results found.  Procedures Procedures    Medications Ordered in ED Medications - No data to display  ED Course/ Medical Decision Making/ A&P    Patient seen and examined. History obtained directly  from patient. Work-up including labs, imaging, EKG ordered in triage, if performed, were reviewed.    Labs/EKG: Independently reviewed and interpreted.  This included: Normal CBC, BMP with slightly elevated glucose and normal electrolytes and kidney function, urinalysis which is not suggestive of infection today.  Imaging: None ordered.  Medications/Fluids: None ordered.  Most recent vital signs reviewed and are as follows: BP (!) 173/99    Pulse 70    Temp 98.4 F (36.9 C) (Oral)    Resp 16  Ht 5\' 3"  (1.6 m)    Wt 54.3 kg    SpO2 98%    BMI 21.21 kg/m   Initial impression: Generalized weakness, Parkinson's disease  I had a good discussion with patient and husband at bedside.  Patient states that Michelle Sims did not feel much different than Michelle Sims typically does.  Michelle Sims states that every day with Parkinson's disease Michelle Sims feels a bit unwell.  We discussed results.  They seem reassured that her urine is clear today after recent UTI.  Patient does not have any other specific complaints.  They are comfortable with discharge to home.  Home treatment plan: Monitoring, upcoming appointment with urologist per patient.  Return instructions discussed with patient: Any strokelike symptoms, weakness or new symptoms.  Follow-up instructions discussed with patient: PCP follow-up as needed.                           Medical Decision Making Amount and/or Complexity of Data Reviewed Labs: ordered.   Patient with weakness and a fall today.  Michelle Sims has a history of Parkinson's disease and attributes most of her symptoms to this.  Michelle Sims denies any focused symptoms.  Michelle Sims denies any strokelike symptoms.  Work-up here including CBC, BMP are reassuring.  There was concern of recurrent UTI given recent history, however her urine looks fairly clear today.  Michelle Sims is not complaining about any pain.  Michelle Sims seems to be back to her baseline.  They agree, no further testing indicated at this time.  Patient and family comfortable with  discharge home.  They seem reliable to return if something does change.           Final Clinical Impression(s) / ED Diagnoses Final diagnoses:  Generalized weakness    Rx / DC Orders ED Discharge Orders     None         Carlisle Cater, PA-C 02/22/21 1659    Regan Lemming, MD 02/22/21 2052

## 2021-03-02 ENCOUNTER — Other Ambulatory Visit: Payer: Self-pay | Admitting: Adult Health

## 2021-03-02 NOTE — Telephone Encounter (Signed)
Pt's husband, Jamyah Folk (on [place]) pt's right leg is quivering a lot and both legs get frozen. Would like a call from the nurse Lovey Newcomer) to discuss scheduling a sooner appt than May.

## 2021-03-03 ENCOUNTER — Institutional Professional Consult (permissible substitution): Payer: Medicare Other | Admitting: Neurology

## 2021-03-03 NOTE — Telephone Encounter (Signed)
I called husband of pt.  I offered appt with Dr. Rexene Alberts at 1245 today, but he was not able to come due to other appt.  I relayed will look on MM/NP schedule if any later afternoon available appt come up.  She has CL 25/100 qid, then CL CR 25/100 at bedtime.  He has to get up and assist her in BRP at night )has more freezing episodes.  Will look for later appt, but earlier appt.  ? Increase CR dose at bedtime.

## 2021-03-03 NOTE — Telephone Encounter (Signed)
Spoke to husband pt takes 25/100 CL 0800-1100-1500-2000 , bedtime (9-10PM), then has freezing episodes seems more at night (erratic) coiuld be 2-5 AM when up to bathroom.  Seems to be worsening.

## 2021-03-03 NOTE — Telephone Encounter (Signed)
LMVM for pts husband on home and mobile that need specific times she takes the Carbidopa Levodopa in  am, 1100-1500-2000, then  extended release at bedtime.  What time freezing at night when gets up to bathroom? May LM with phone staff.

## 2021-03-04 NOTE — Telephone Encounter (Signed)
Ward Givens, NP  [facility] Calls 44 minutes ago (11:21 AM)   Make sure taking sinemet CR dose before bedtime. Make sure patient is staying well hydrated during the day. Resting during the day if needed. Not sure that we should increase medication at this time. Lets continue to monitor.    I called the pt's husband back, ok per dpr and relayed message from NP, also ok per dpr.  Husband advised to call back if he had any questions/concerns.

## 2021-04-12 ENCOUNTER — Telehealth: Payer: Self-pay | Admitting: *Deleted

## 2021-04-12 NOTE — Telephone Encounter (Signed)
I called husband back and he could not make appt that day in the afternoon.  He would still like to see if can come in sooner and promised to make it work next time afternoon appt was offered.  He appreciated call back.  ?

## 2021-04-12 NOTE — Telephone Encounter (Signed)
I called afternoon appt opening with Dr. Rexene Alberts at 1415 Wednesday 04-14-2021. He stated that they had appt already with another provider and could not take this.   ?

## 2021-04-14 ENCOUNTER — Encounter: Payer: Self-pay | Admitting: Internal Medicine

## 2021-04-14 ENCOUNTER — Non-Acute Institutional Stay: Payer: BC Managed Care – PPO | Admitting: Internal Medicine

## 2021-04-14 ENCOUNTER — Other Ambulatory Visit: Payer: Self-pay

## 2021-04-14 VITALS — BP 98/66 | HR 92 | Temp 97.2°F | Ht 63.0 in

## 2021-04-14 DIAGNOSIS — G20 Parkinson's disease: Secondary | ICD-10-CM

## 2021-04-14 DIAGNOSIS — M810 Age-related osteoporosis without current pathological fracture: Secondary | ICD-10-CM

## 2021-04-14 DIAGNOSIS — R4189 Other symptoms and signs involving cognitive functions and awareness: Secondary | ICD-10-CM

## 2021-04-14 DIAGNOSIS — E890 Postprocedural hypothyroidism: Secondary | ICD-10-CM

## 2021-04-14 DIAGNOSIS — I10 Essential (primary) hypertension: Secondary | ICD-10-CM | POA: Diagnosis not present

## 2021-04-14 DIAGNOSIS — N3946 Mixed incontinence: Secondary | ICD-10-CM

## 2021-04-14 DIAGNOSIS — F339 Major depressive disorder, recurrent, unspecified: Secondary | ICD-10-CM

## 2021-04-14 NOTE — Progress Notes (Signed)
? ?Location:  [facility] ?  ?Place of Service:  Clinic (12) ? ?Provider:  ? ?Code Status:  ?Goals of Care:  ? ?  04/14/2021  ? 10:56 AM  ?Advanced Directives  ?Does Patient Have a Medical Advance Directive? No  ?Would patient like information on creating a medical advance directive? No - Patient declined  ? ? ? ?Chief Complaint  ?Patient presents with  ?? Medical Management of Chronic Issues  ?  Patient returns to the clinic for her 4 month follow up.   ?? Quality Metric Gaps  ?  Verified [facility] and [facility] patient is due for Shingrix, Pap smear and TDAP  ? ? ?HPI: Patient is a 88 y.o. female seen today for medical management of chronic diseases.   ? ? ?Patient has h/o Parkinson disease, hypertension hypothyroidism, history of breast cancer s/p mastectomy, history of chronic back pain. ?  ?Patient lives with her husband in the [place] ?  ?Patient sees Dr. Rexene Alberts for her Parkinson.  Has seen [name] before.  Has seen Dr. Mervyn Skeeters at [facility] ?Patient has had extensive work-up ? Parkinson ?Was seen by Dr Jenene Slicker in [facility]. Per her notes patient has definite diagnosis of Parkinson disease.  She recommended increasing the carbidopa/levodopa to 1.5 tablets for the first 2 doses and then eventually increased to 1.5 for all 4 doses ? ?He never made the change.  She is also not very compliant with her Sinemet. ? they say it does not help.  Since her last visit with me patient has progressively gotten weaker.  She freezes her voices gets hoarse.  She gets dizzy and unstable.  She has fallen a number of times. ?Has had 1 ED visit on 01/30 ?Has been states that he is providing more care to her now.  She is unable to do her ADLs without his help ?She has not been able to do therapy due to some insurance issues ?Marland Kitchen  They do have caregivers coming few hours couple of times a week to help her. ?She does have some good days when she is able to walk with her walker.  But mostly stays in her wheelchair. ?Wt  Readings from Last 3 Encounters:  ?02/22/21 119 lb 11.4 oz (54.3 kg)  ?12/16/20 119 lb 12.8 oz (54.3 kg)  ?12/07/20 120 lb 9.6 oz (54.7 kg)  ?  ?Past Medical History:  ?Diagnosis Date  ?? Anxiety   ?? Arthritis   ? knee & back   ?? Blood type, Rh negative   ?? Breast cancer ([place])   ?? Chronic fatigue   ?? Depression   ?? Diverticulosis   ?? Endocervical polyp   ?? GERD (gastroesophageal reflux disease)   ?? Hypertension   ?? Hypothyroidism   ?? IBS (irritable bowel syndrome)   ?? Insomnia   ?? Lymphedema   ?? Neuromuscular disorder ([place])   ? Parkinson's disease   ?? Osteopenia 06/2015  ? T score -2.4 distal third of the radius  ?? Parkinson disease ([place])   ?? RBBB 10/07/2013  ?? Thyroid cancer ([place])   ? ? ?Past Surgical History:  ?Procedure Laterality Date  ?? APPENDECTOMY  1979  ?? BREAST SURGERY  1979  ? MASTECTOMY AND RECONSTRUCTION  ?? DILATION AND CURETTAGE OF UTERUS    ? MISCARRIAGE  ?? EYE SURGERY Bilateral   ? w/ IOL  ?? HYSTEROSCOPY  [id number]  ? AND D&C  ?? MASTECTOMY Bilateral   ?? THYROIDECTOMY    ?? TONSILLECTOMY    ??  TOTAL KNEE ARTHROPLASTY Right 10/07/2013  ? Procedure: RIGHT TOTAL KNEE ARTHROPLASTY;  Surgeon: Kerin Salen, MD;  Location: [place];  Service: Orthopedics;  Laterality: Right;  ?? TUBAL LIGATION    ? ? ?Allergies  ?Allergen Reactions  ?? Sulfa Antibiotics Rash  ?? Biaxin [Clarithromycin] Other (See Comments)  ?  Pt does not remember reaction ?  ?? Ciprofloxacin Nausea And Vomiting  ?? Hctz [Hydrochlorothiazide] Other (See Comments)  ?  weakness and hyponatremia  ?? Macrobid [Nitrofurantoin] Nausea Only  ?? Other Nausea Only  ?? Prednisone Other (See Comments)  ?  Swelling and facial redness ?  ?? Sulfamethoxazole-Trimethoprim Other (See Comments) and Rash  ?  Sore throat  ? ? ?Outpatient Encounter Medications as of 04/14/2021  ?Medication Sig  ?? Ascorbic Acid (VITAMIN C PO) Take by mouth.  ?? aspirin EC 81 MG tablet Take 81 mg by mouth daily.  ?? carbidopa-levodopa (SINEMET IR) 25-100 MG  tablet Take by mouth. 1 in am, 1 at 11a, 1 at 3p and 1 at 8p  ?? Carbidopa-Levodopa ER (SINEMET CR) 25-100 MG tablet controlled release TAKE 1 TABLET BY MOUTH EVERYDAY AT BEDTIME  ?? cholecalciferol (VITAMIN D) 1000 units tablet Take 2,000 Units by mouth daily.  ?? co-enzyme Q-10 30 MG capsule Take 30 mg by mouth daily.  ?? Cranberry 300 MG tablet Take 300 mg by mouth every other day.  ?? CRANBERRY-VITAMIN C-D MANNOSE PO Take 3 tablets by mouth daily.  ?? escitalopram (LEXAPRO) 10 MG tablet Take 20 mg by mouth daily.  ?? irbesartan (AVAPRO) 75 MG tablet Take 75 mg by mouth at bedtime.  ?? levothyroxine (SYNTHROID, LEVOTHROID) 112 MCG tablet Take 112 mcg by mouth daily before breakfast.  ?? MYRBETRIQ 25 MG TB24 tablet Take 25-50 mg by mouth daily.  ?? NONFORMULARY OR COMPOUNDED ITEM Estradiol vag cream 0.02% (0.'2mg'$ /ml)  ?S:  Insert 1 ml (1 syringe) into the vagina hs 2 times weekly as directed.  ?? rivastigmine (EXELON) 9.5 mg/24hr APPLY 1 PATCH ONTO THE SKIN EVERY DAY  ?? [DISCONTINUED] desmopressin (DDAVP) 0.2 MG tablet Take 0.2 mg by mouth daily.  ?? [DISCONTINUED] amLODipine (NORVASC) 2.5 MG tablet Take 2.5 mg by mouth daily.  ? ?No facility-administered encounter medications on file as of 04/14/2021.  ? ? ?Review of Systems:  ?Review of Systems  ?Constitutional:  Positive for activity change and appetite change.  ?HENT: Negative.    ?Respiratory:  Negative for cough and shortness of breath.   ?Cardiovascular:  Negative for leg swelling.  ?Gastrointestinal:  Positive for constipation.  ?Genitourinary:  Positive for frequency.  ?Musculoskeletal:  Positive for gait problem. Negative for arthralgias and myalgias.  ?Skin: Negative.   ?Neurological:  Positive for weakness. Negative for dizziness.  ?Psychiatric/Behavioral:  Positive for confusion. Negative for dysphoric mood and sleep disturbance.   ? ?Health Maintenance  ?Topic Date Due  ?? Zoster Vaccines- Shingrix (1 of 2) 03/09/1948  ?? PAP SMEAR-Modifier   03/15/2013  ?? TETANUS/TDAP  03/11/2020  ?? COVID-19 Vaccine (3 - Moderna risk series) 12/15/2021 (Originally 12/04/2020)  ?? Pneumonia Vaccine 72+ Years old  Completed  ?? INFLUENZA VACCINE  Completed  ?? DEXA SCAN  Completed  ?? HPV VACCINES  Aged Out  ? ? ?Physical Exam: ?Vitals:  ? 04/14/21 1128  ?BP: 98/66  ?Pulse: 92  ?Temp: (!) 97.2 ?F (36.2 ?C)  ?SpO2: 93%  ?Height: '5\' 3"'$  (1.6 m)  ? ?Body mass index is 21.21 kg/m?Marland Kitchen ?Physical Exam ?Vitals reviewed.  ?Constitutional:   ?  Appearance: Normal appearance.  ?HENT:  ?   Head: Normocephalic.  ?   Nose: Nose normal.  ?   Mouth/Throat:  ?   Mouth: Mucous membranes are moist.  ?   Pharynx: Oropharynx is clear.  ?Eyes:  ?   Pupils: Pupils are equal, round, and reactive to light.  ?Cardiovascular:  ?   Rate and Rhythm: Normal rate and regular rhythm.  ?   Pulses: Normal pulses.  ?   Heart sounds: Normal heart sounds. No murmur heard. ?Pulmonary:  ?   Effort: Pulmonary effort is normal.  ?   Breath sounds: Normal breath sounds.  ?Abdominal:  ?   General: Abdomen is flat. Bowel sounds are normal.  ?   Palpations: Abdomen is soft.  ?Musculoskeletal:     ?   General: No swelling.  ?   Cervical back: Neck supple.  ?Skin: ?   General: Skin is warm.  ?Neurological:  ?   Mental Status: She is alert and oriented to person, place, and time.  ?   Comments: Was unable to get up from the wheelchair by herself and needed 2 hand assist.   ?Psychiatric:     ?   Mood and Affect: Mood normal.     ?   Thought Content: Thought content normal.  ? ? ?Labs reviewed: ?Basic Metabolic Panel: ?Recent Labs  ?  02/22/21 ?1241  ?NA 137  ?K 3.9  ?CL 102  ?CO2 28  ?GLUCOSE 170*  ?BUN 22  ?CREATININE 0.91  ?CALCIUM 8.8*  ? ?Liver Function Tests: ?No results for input(s): AST, ALT, ALKPHOS, BILITOT, PROT, ALBUMIN in the last 8760 hours. ?No results for input(s): LIPASE, AMYLASE in the last 8760 hours. ?No results for input(s): AMMONIA in the last 8760 hours. ?CBC: ?Recent Labs  ?  02/22/21 ?1241  ?WBC  5.6  ?HGB 12.7  ?HCT 39.7  ?MCV 96.4  ?PLT 212  ? ?Lipid Panel: ?No results for input(s): CHOL, HDL, LDLCALC, TRIG, CHOLHDL, LDLDIRECT in the last 8760 hours. ?Lab Results  ?Component Value Date  ? HGBA1C 5.6 10/16/18

## 2021-04-19 ENCOUNTER — Encounter (HOSPITAL_COMMUNITY): Payer: Self-pay | Admitting: *Deleted

## 2021-04-19 ENCOUNTER — Emergency Department (HOSPITAL_COMMUNITY): Payer: BC Managed Care – PPO

## 2021-04-19 ENCOUNTER — Observation Stay (HOSPITAL_COMMUNITY): Payer: BC Managed Care – PPO

## 2021-04-19 ENCOUNTER — Other Ambulatory Visit: Payer: Self-pay

## 2021-04-19 ENCOUNTER — Observation Stay (HOSPITAL_COMMUNITY)
Admission: EM | Admit: 2021-04-19 | Discharge: 2021-04-20 | Disposition: A | Discharge status: Skilled Nursing Facility | Payer: BC Managed Care – PPO | Attending: Internal Medicine | Admitting: Internal Medicine

## 2021-04-19 DIAGNOSIS — G9340 Encephalopathy, unspecified: Principal | ICD-10-CM | POA: Insufficient documentation

## 2021-04-19 DIAGNOSIS — F32A Depression, unspecified: Secondary | ICD-10-CM | POA: Diagnosis not present

## 2021-04-19 DIAGNOSIS — Z853 Personal history of malignant neoplasm of breast: Secondary | ICD-10-CM | POA: Insufficient documentation

## 2021-04-19 DIAGNOSIS — Z79899 Other long term (current) drug therapy: Secondary | ICD-10-CM | POA: Insufficient documentation

## 2021-04-19 DIAGNOSIS — R4182 Altered mental status, unspecified: Secondary | ICD-10-CM

## 2021-04-19 DIAGNOSIS — Z96651 Presence of right artificial knee joint: Secondary | ICD-10-CM | POA: Diagnosis not present

## 2021-04-19 DIAGNOSIS — G20 Parkinson's disease: Secondary | ICD-10-CM | POA: Insufficient documentation

## 2021-04-19 DIAGNOSIS — E890 Postprocedural hypothyroidism: Secondary | ICD-10-CM | POA: Insufficient documentation

## 2021-04-19 DIAGNOSIS — Z85850 Personal history of malignant neoplasm of thyroid: Secondary | ICD-10-CM | POA: Diagnosis not present

## 2021-04-19 DIAGNOSIS — Z7982 Long term (current) use of aspirin: Secondary | ICD-10-CM | POA: Insufficient documentation

## 2021-04-19 DIAGNOSIS — I10 Essential (primary) hypertension: Secondary | ICD-10-CM | POA: Diagnosis present

## 2021-04-19 LAB — CBC
HCT: 38.7 % (ref 36.0–46.0)
Hemoglobin: 12.7 g/dL (ref 12.0–15.0)
MCH: 31.7 pg (ref 26.0–34.0)
MCHC: 32.8 g/dL (ref 30.0–36.0)
MCV: 96.5 fL (ref 80.0–100.0)
Platelets: 201 10*3/uL (ref 150–400)
RBC: 4.01 MIL/uL (ref 3.87–5.11)
RDW: 13 % (ref 11.5–15.5)
WBC: 5.4 10*3/uL (ref 4.0–10.5)
nRBC: 0 % (ref 0.0–0.2)

## 2021-04-19 LAB — COMPREHENSIVE METABOLIC PANEL
ALT: 9 U/L (ref 0–44)
AST: 15 U/L (ref 15–41)
Albumin: 3.4 g/dL — ABNORMAL LOW (ref 3.5–5.0)
Alkaline Phosphatase: 59 U/L (ref 38–126)
Anion gap: 7 (ref 5–15)
BUN: 26 mg/dL — ABNORMAL HIGH (ref 8–23)
CO2: 25 mmol/L (ref 22–32)
Calcium: 8.6 mg/dL — ABNORMAL LOW (ref 8.9–10.3)
Chloride: 106 mmol/L (ref 98–111)
Creatinine, Ser: 0.88 mg/dL (ref 0.44–1.00)
GFR, Estimated: >60 mL/min (ref >60)
Glucose, Bld: 95 mg/dL (ref 70–99)
Potassium: 3.5 mmol/L (ref 3.5–5.1)
Sodium: 138 mmol/L (ref 135–145)
Total Bilirubin: 1.2 mg/dL (ref 0.3–1.2)
Total Protein: 6.1 g/dL — ABNORMAL LOW (ref 6.5–8.1)

## 2021-04-19 LAB — URINALYSIS, ROUTINE W REFLEX MICROSCOPIC
Bilirubin Urine: NEGATIVE
Glucose, UA: NEGATIVE mg/dL
Hgb urine dipstick: NEGATIVE
Ketones, ur: NEGATIVE mg/dL
Leukocytes,Ua: NEGATIVE
Nitrite: NEGATIVE
Protein, ur: NEGATIVE mg/dL
Specific Gravity, Urine: 1.011 (ref 1.005–1.030)
pH: 6 (ref 5.0–8.0)

## 2021-04-19 LAB — PROTIME-INR
INR: 1 (ref 0.8–1.2)
Prothrombin Time: 13.1 seconds (ref 11.4–15.2)

## 2021-04-19 LAB — AMMONIA: Ammonia: 19 umol/L (ref 9–35)

## 2021-04-19 MED ORDER — ENOXAPARIN SODIUM 40 MG/0.4ML IJ SOSY
40.0000 mg | PREFILLED_SYRINGE | INTRAMUSCULAR | Status: DC
  Administered 2021-04-19 – 2021-04-20 (×2): 40 mg via SUBCUTANEOUS
  Filled 2021-04-19 (×2): qty 0.4

## 2021-04-19 MED ORDER — SODIUM CHLORIDE 0.9 % IV BOLUS
500.0000 mL | Freq: Once | INTRAVENOUS | Status: AC
  Administered 2021-04-19: 500 mL via INTRAVENOUS

## 2021-04-19 MED ORDER — SODIUM CHLORIDE 0.9 % IV SOLN: INTRAVENOUS | Status: DC

## 2021-04-19 MED ORDER — ESCITALOPRAM OXALATE 10 MG PO TABS
20.0000 mg | ORAL_TABLET | Freq: Every day | ORAL | Status: DC
  Administered 2021-04-19: 20 mg via ORAL
  Filled 2021-04-19: qty 2

## 2021-04-19 MED ORDER — SODIUM CHLORIDE 0.9% FLUSH
3.0000 mL | Freq: Two times a day (BID) | INTRAVENOUS | Status: DC
  Administered 2021-04-20: 3 mL via INTRAVENOUS

## 2021-04-19 MED ORDER — LORAZEPAM 2 MG/ML IJ SOLN
0.5000 mg | Freq: Once | INTRAMUSCULAR | Status: AC
  Administered 2021-04-19: 0.5 mg via INTRAVENOUS

## 2021-04-19 MED ORDER — GADOBUTROL 1 MMOL/ML IV SOLN
5.5000 mL | Freq: Once | INTRAVENOUS | Status: AC | PRN
  Administered 2021-04-19: 5.5 mL via INTRAVENOUS

## 2021-04-19 MED ORDER — ONDANSETRON HCL 4 MG PO TABS: 4.0000 mg | ORAL_TABLET | Freq: Four times a day (QID) | ORAL | Status: DC | PRN

## 2021-04-19 MED ORDER — LORAZEPAM 2 MG/ML IJ SOLN: 0.5000 mg | Freq: Four times a day (QID) | INTRAMUSCULAR | Status: DC | PRN

## 2021-04-19 MED ORDER — CARBIDOPA-LEVODOPA ER 25-100 MG PO TBCR
1.0000 | EXTENDED_RELEASE_TABLET | Freq: Every day | ORAL | Status: DC
  Filled 2021-04-19 (×3): qty 1

## 2021-04-19 MED ORDER — ACETAMINOPHEN 650 MG RE SUPP: 650.0000 mg | Freq: Four times a day (QID) | RECTAL | Status: DC | PRN

## 2021-04-19 MED ORDER — ALBUTEROL SULFATE (2.5 MG/3ML) 0.083% IN NEBU: 2.5000 mg | INHALATION_SOLUTION | Freq: Four times a day (QID) | RESPIRATORY_TRACT | Status: DC | PRN

## 2021-04-19 MED ORDER — MIRABEGRON ER 50 MG PO TB24
50.0000 mg | ORAL_TABLET | Freq: Every day | ORAL | Status: DC
  Filled 2021-04-19 (×3): qty 1

## 2021-04-19 MED ORDER — HYDRALAZINE HCL 20 MG/ML IJ SOLN
10.0000 mg | INTRAMUSCULAR | Status: DC | PRN
Start: 2021-04-19 — End: 2021-04-20
  Administered 2021-04-19 (×2): 10 mg via INTRAVENOUS
  Filled 2021-04-19 (×4): qty 1

## 2021-04-19 MED ORDER — CARBIDOPA-LEVODOPA 25-100 MG PO TABS
1.0000 | ORAL_TABLET | Freq: Four times a day (QID) | ORAL | Status: DC
  Administered 2021-04-19 – 2021-04-20 (×2): 1 via ORAL
  Filled 2021-04-19 (×2): qty 1

## 2021-04-19 MED ORDER — LEVOTHYROXINE SODIUM 112 MCG PO TABS
112.0000 ug | ORAL_TABLET | Freq: Every day | ORAL | Status: DC
  Administered 2021-04-20: 112 ug via ORAL
  Filled 2021-04-19: qty 1

## 2021-04-19 MED ORDER — IRBESARTAN 75 MG PO TABS
37.5000 mg | ORAL_TABLET | Freq: Every day | ORAL | Status: DC
  Administered 2021-04-20: 37.5 mg via ORAL
  Filled 2021-04-19 (×3): qty 0.5

## 2021-04-19 MED ORDER — LORAZEPAM 2 MG/ML IJ SOLN
INTRAMUSCULAR | Status: AC
Start: 2021-04-19 — End: 2021-04-20
  Filled 2021-04-19: qty 1

## 2021-04-19 MED ORDER — ACETAMINOPHEN 325 MG PO TABS
650.0000 mg | ORAL_TABLET | Freq: Four times a day (QID) | ORAL | Status: DC | PRN
  Administered 2021-04-20: 650 mg via ORAL
  Filled 2021-04-19: qty 2

## 2021-04-19 MED ORDER — RIVASTIGMINE 9.5 MG/24HR TD PT24
9.5000 mg | MEDICATED_PATCH | Freq: Every day | TRANSDERMAL | Status: DC
  Administered 2021-04-20: 9.5 mg via TRANSDERMAL
  Filled 2021-04-19 (×3): qty 1

## 2021-04-19 MED ORDER — ONDANSETRON HCL 4 MG/2ML IJ SOLN
4.0000 mg | Freq: Four times a day (QID) | INTRAMUSCULAR | Status: DC | PRN
Start: 2021-04-19 — End: 2021-04-20

## 2021-04-19 MED ORDER — ASPIRIN EC 81 MG PO TBEC
81.0000 mg | DELAYED_RELEASE_TABLET | Freq: Every day | ORAL | Status: DC
Start: 2021-04-19 — End: 2021-04-20
  Administered 2021-04-20: 81 mg via ORAL
  Filled 2021-04-19: qty 1

## 2021-04-19 NOTE — H&P (Addendum)
History and Physical    Patient: Michelle Sims WUJ:[id number] DOB: May 22, 1929 DOA: 04/19/2021 DOS: the patient was seen and examined on 04/19/2021 PCP: Butch Penny, NP  Patient coming from: [facility] via EMS  Chief Complaint:  Chief Complaint  Patient presents with   Altered Mental Status   HPI: Michelle Sims is a 88 y.o. female with medical history significant of hypertension, Parkinson's disease, thyroid cancer s/p resection with postsurgical hypothyroidism, anxiety, depression, and breast cancer who presented after being found to be acutely altered this morning.  All information is obtained from the patient's husband present at bedside as she is currently unable to provide history.  Husband notes that due to her Parkinson's she is reliant on him for all of her ADLs, but has been able to feed herself.  She normally is alert and oriented to at least person and place, but not normally oriented to time.  Since yesterday evening has not eaten or drinking anything.  Over the last couple of years her gait and balance have significantly declined.  He makes note that there have been intermittent times where the patient is unresponsive for a couple of minutes before he is able to get her to awaken.  However, this morning he was unable to wake her for over an hour until the home health nurse arrived.  He noticed that the patient had bitten her lip this morning and notes that that it never happened before in the past to his knowledge.  He denies seeing any seizure-like activity.  Upon admission into the emergency department patient was noted to be afebrile with blood pressures 130/103-180/98, and all other vital signs maintained.  Labs significant for BUN 26 and creatinine 0.80.  Urinalysis did not show any signs of infection.  Chest x-ray within normal limits.  CT scan of the head negative for any acute abnormality.  Blood culture was obtained.  Review of Systems: unable to review all  systems due to the inability of the patient to answer questions. Past Medical History:  Diagnosis Date   Anxiety    Arthritis    knee & back    Blood type, Rh negative    Breast cancer ([facility])    Chronic fatigue    Depression    Diverticulosis    Endocervical polyp    GERD (gastroesophageal reflux disease)    Hypertension    Hypothyroidism    IBS (irritable bowel syndrome)    Insomnia    Lymphedema    Neuromuscular disorder ([facility])    Parkinson's disease    Osteopenia 06/2015   T score -2.4 distal third of the radius   Parkinson disease ([facility])    RBBB 10/07/2013   Thyroid cancer ([facility])    Past Surgical History:  Procedure Laterality Date   APPENDECTOMY  1979   BREAST SURGERY  1979   MASTECTOMY AND RECONSTRUCTION   DILATION AND CURETTAGE OF UTERUS     MISCARRIAGE   EYE SURGERY Bilateral    w/ IOL   HYSTEROSCOPY  2005,2009   AND D&C   MASTECTOMY Bilateral    THYROIDECTOMY     TONSILLECTOMY     TOTAL KNEE ARTHROPLASTY Right 10/07/2013   Procedure: RIGHT TOTAL KNEE ARTHROPLASTY;  Surgeon: Nestor Lewandowsky, MD;  Location: [facility];  Service: Orthopedics;  Laterality: Right;   TUBAL LIGATION     Social History:  reports that she has never smoked. She has never used smokeless tobacco. She reports current alcohol use. She reports that she  does not use drugs.  Allergies  Allergen Reactions   Sulfa Antibiotics Rash   Biaxin [Clarithromycin] Other (See Comments)    Pt does not remember reaction    Ciprofloxacin Nausea And Vomiting   Hctz [Hydrochlorothiazide] Other (See Comments)    weakness and hyponatremia   Macrobid [Nitrofurantoin] Nausea Only   Other Nausea Only   Prednisone Other (See Comments)    Swelling and facial redness    Sulfamethoxazole-Trimethoprim Other (See Comments) and Rash    Sore throat    Family History  Problem Relation Age of Onset   Hypertension Mother    Hyperlipidemia Mother    Heart failure Mother    Hypertension Father    Hyperlipidemia Father     Heart failure Father    Hyperlipidemia Brother    Hypertension Brother    Heart attack Brother    Breast cancer Neg Hx    Parkinson's disease Neg Hx     Prior to Admission medications   Medication Sig Start Date End Date Taking? Authorizing Provider  Ascorbic Acid (VITAMIN C PO) Take by mouth.    [provider]  aspirin EC 81 MG tablet Take 81 mg by mouth daily.    [provider]  carbidopa-levodopa (SINEMET IR) 25-100 MG tablet Take by mouth. 1 in am, 1 at 11a, 1 at 3p and 1 at 8p    [provider]  Carbidopa-Levodopa ER (SINEMET CR) 25-100 MG tablet controlled release TAKE 1 TABLET BY MOUTH EVERYDAY AT BEDTIME 05/30/18   Huston Foley, MD  cholecalciferol (VITAMIN D) 1000 units tablet Take 2,000 Units by mouth daily.    [provider]  co-enzyme Q-10 30 MG capsule Take 30 mg by mouth daily.    [provider]  Cranberry 300 MG tablet Take 300 mg by mouth every other day.    [provider]  CRANBERRY-VITAMIN C-D MANNOSE PO Take 3 tablets by mouth daily.    [provider]  escitalopram (LEXAPRO) 10 MG tablet Take 20 mg by mouth daily.    [provider]  irbesartan (AVAPRO) 75 MG tablet Take 75 mg by mouth at bedtime. 11/09/10   [provider]  levothyroxine (SYNTHROID, LEVOTHROID) 112 MCG tablet Take 112 mcg by mouth daily before breakfast.    [provider]  MYRBETRIQ 25 MG TB24 tablet Take 25-50 mg by mouth daily. 03/24/17   [provider]  NONFORMULARY OR COMPOUNDED ITEM Estradiol vag cream 0.02% (0.2mg /ml)  S:  Insert 1 ml (1 syringe) into the vagina hs 2 times weekly as directed. 05/20/19   Harrington Challenger, NP  rivastigmine (EXELON) 9.5 mg/24hr APPLY 1 PATCH ONTO THE SKIN EVERY DAY 03/02/21   Butch Penny, NP    Physical Exam: Vitals:   04/19/21 1030 04/19/21 1045 04/19/21 1115 04/19/21 1130  BP: (!) 180/90 (!) 130/103 (!) 180/98 (!) 158/95  Pulse: 74 68 64 66  Resp: 14 20  16 17   Temp:      TempSrc:      SpO2: 99% 97% 98% 96%   Constitutional: Lethargic elderly female Eyes: PERRL, lids and conjunctivae normal ENMT: Mucous membranes are dry with blood present on her lip and teeth.  Posterior pharynx clear of any exudate or lesions.  Neck: normal, supple, no masses, no thyromegaly Respiratory: clear to auscultation bilaterally, no wheezing, no crackles. Normal respiratory effort. No accessory muscle use.  Cardiovascular: Regular rate and rhythm, no murmurs / rubs / gallops. No extremity edema.  Abdomen: no  tenderness, no masses palpated. No hepatosplenomegaly. Bowel sounds positive.  Musculoskeletal: no clubbing / cyanosis. No joint deformity upper and lower extremities.  Skin: no rashes, lesions, ulcers. No induration Neurologic: CN 2-12 grossly intact.  Appears able to move all extremities. Psychiatric: Lethargic unable to assess orientation at this time.  Data Reviewed:  Reviewed labs, imaging, and pertinent records as noted above in HPI. Assessment and Plan: * Acute encephalopathy Patient was noted to have decreased responsiveness this morning.  Patient reported to have blood on her lips for which he was concern for possibility of seizure.  Neurology have been formally consulted..  No acute findings on chest x-ray, urinalysis, or CT scan of the head.  Labs significant for elevated BUN to creatinine ratio to suggest patient may be dehydrated.  -Admit to a telemetry bed  -Seizure precautions -Neurochecks -Add on TSH -EEG, vitamin B12 level, ammonia, and MRI of the brain per neurology -Bolus 500 mL of IV fluids then placed on rate of 75 mL/h -Transitions of care, PT, and OT consulted in case patient is in need of skilled nursing and is no longer able to be in assisted living -Appreciate neurology,  will follow-up for any further recommendations  Essential hypertension On admission blood pressures were noted to elevated up to 180/98.  Home blood pressure  regimen includes irbesartan 37.5 mg daily nightly. -Continue irbesartan -Hydralazine IV as needed for elevated blood pressure  Parkinson's disease (tremor, stiffness, slow motion, unstable posture) ([facility]) Patient has been previously diagnosed with Parkinson's disease.  She is reported to normally be alert and oriented at least to person and place.  Over the last several years she has had progressive decline in her gait and balance  needing assistance for all of ADLs. -Continue carbidopa levodopa and rivastigmine  Hypothyroidism, postsurgical history of thyroid cancer Patient with prior history of thyroid cancer status post resection with residual hypothyroidism. -Follow-up TSH -Continue levothyroxine  Depression -Continue Lexapro      Advance Care Planning:   Code Status: Full Code by husband at bedside.  Consults: neurology  Family Communication: Husband updated at bedside  Severity of Illness: The appropriate patient status for this patient is OBSERVATION. Observation status is judged to be reasonable and necessary in order to provide the required intensity of service to ensure the patient's safety. The patient's presenting symptoms, physical exam findings, and initial radiographic and laboratory data in the context of their medical condition is felt to place them at decreased risk for further clinical deterioration. Furthermore, it is anticipated that the patient will be medically stable for discharge from the hospital within 2 midnights of admission.   Author: Clydie Braun, MD 04/19/2021 11:53 AM  For on call review [email or web address].

## 2021-04-19 NOTE — ED Triage Notes (Signed)
Patient presents to ED via [facility] from [place]. Per ems patient has increased weakness since Fri. Upon ems arrival patient was supine in the bed with swallow resp then became more resp. With ems. Upon arrival patient is alert oriented to name and place. Noted to have dried blood on her lips states she bites on her lips.  ?

## 2021-04-19 NOTE — Assessment & Plan Note (Addendum)
Patient has been previously diagnosed with Parkinson's disease.  She is reported to normally be alert and oriented at least to person and place.  Over the last several years she has had progressive decline in her gait and balance  needing assistance for all of ADLs. ?-Continue carbidopa levodopa and rivastigmine ?

## 2021-04-19 NOTE — Assessment & Plan Note (Addendum)
Patient with prior history of thyroid cancer status post resection with residual hypothyroidism. ?-Follow-up TSH ?-Continue levothyroxine ?

## 2021-04-19 NOTE — Assessment & Plan Note (Addendum)
Patient was noted to have decreased responsiveness this morning.  Patient reported to have blood on her lips for which he was concern for possibility of seizure.  Neurology have been formally consulted..  No acute findings on chest x-ray, urinalysis, or CT scan of the head.  Labs significant for elevated BUN to creatinine ratio to suggest patient may be dehydrated.   ?-Admit to a telemetry bed  ?-Seizure precautions ?-Neurochecks ?-Add on TSH ?-EEG, vitamin B12 level, ammonia, and MRI of the brain per neurology ?-Bolus 500 mL of IV fluids then placed on rate of 75 mL/h ?-Appreciate neurology, we will follow-up for any further recommendations ?

## 2021-04-19 NOTE — ED Notes (Signed)
Patient transported to MRI 

## 2021-04-19 NOTE — Consult Note (Signed)
NEUROLOGY CONSULTATION NOTE  ? ?Date of service: April 19, 2021 ?Patient Name: Michelle Sims ?MRN:  [id number] ?DOB:  07/04/1929 ?Reason for consult: "encephalopathy" ?Requesting Provider: Norval Morton, MD ? ?History of Present Illness  ?YOLANI VO is a 88 y.o. female with a past medical history of Parkinson disease, presumed mild cognitive impairment versus dementia, Hx breast cancer, Hx thyroid cancer with resulting hypothyroidism, hypertension, and overactive bladder.  She was brought to the emergency department today for evaluation of encephalopathy. ? ?Information is obtained through chart review and interview with the husband who is at bedside.  At baseline, the patient is reliant for ADLs, unable to ambulate independently or change her clothes or bathe.  She is reportedly able to feed herself.  The patient is normally communicative and alert and oriented.  The patient lives at [place] with her husband and he is considering moving her to a SNF.  He reports that her mental status has been gradually deteriorating over the past several years.  More recently, he reports physical problems such as increasing gait instability, rigidity, and a decrease in manual dexterity. ? ?Regarding the events leading to presentation, husband reports that he was unable to wake up the patient this morning.  When the patient's home health nurse arrived, she was able to awaken the patient.  He reports noticing dried blood on the patient's lips, but states that he did not observe any tonic-clonic movements. ?  ?ROS  ? ?Constitutional Denies weight loss, fever and chills.   ?HEENT Denies changes in vision and hearing.   ?Respiratory Denies SOB and cough.   ?CV Denies palpitations and CP   ?GI Denies abdominal pain, nausea, vomiting and diarrhea.   ?GU Denies dysuria and urinary frequency.   ?MSK Denies myalgia and joint pain.   ?Skin Denies rash and pruritus.   ?Neurological Denies headache and  syncope.   ?   ? ?Past History  ? ?Past Medical History:  ?Diagnosis Date  ? Anxiety   ? Arthritis   ? knee & back   ? Blood type, Rh negative   ? Breast cancer ([place])   ? Chronic fatigue   ? Depression   ? Diverticulosis   ? Endocervical polyp   ? GERD (gastroesophageal reflux disease)   ? Hypertension   ? Hypothyroidism   ? IBS (irritable bowel syndrome)   ? Insomnia   ? Lymphedema   ? Neuromuscular disorder ([place])   ? Parkinson's disease   ? Osteopenia 06/2015  ? T score -2.4 distal third of the radius  ? Parkinson disease ([place])   ? RBBB 10/07/2013  ? Thyroid cancer ([place])   ? ?Past Surgical History:  ?Procedure Laterality Date  ? APPENDECTOMY  1979  ? BREAST SURGERY  1979  ? MASTECTOMY AND RECONSTRUCTION  ? DILATION AND CURETTAGE OF UTERUS    ? MISCARRIAGE  ? EYE SURGERY Bilateral   ? w/ IOL  ? HYSTEROSCOPY  [id number]  ? AND D&C  ? MASTECTOMY Bilateral   ? THYROIDECTOMY    ? TONSILLECTOMY    ? TOTAL KNEE ARTHROPLASTY Right 10/07/2013  ? Procedure: RIGHT TOTAL KNEE ARTHROPLASTY;  Surgeon: Kerin Salen, MD;  Location: [facility];  Service: Orthopedics;  Laterality: Right;  ? TUBAL LIGATION    ? ?Family History  ?Problem Relation Age of Onset  ? Hypertension Mother   ? Hyperlipidemia Mother   ? Heart failure Mother   ? Hypertension Father   ? Hyperlipidemia Father   ?  Heart failure Father   ? Hyperlipidemia Brother   ? Hypertension Brother   ? Heart attack Brother   ? Breast cancer Neg Hx   ? Parkinson's disease Neg Hx   ? ?Social History  ? ?Socioeconomic History  ? Marital status: Married  ?  Spouse name: Not on file  ? Number of children: Not on file  ? Years of education: Not on file  ? Highest education level: Not on file  ?Occupational History  ? Occupation: retired  ?  Comment: "odd jobs"  ?Tobacco Use  ? Smoking status: Never  ? Smokeless tobacco: Never  ?Vaping Use  ? Vaping Use: Never used  ?Substance and Sexual Activity  ? Alcohol use: Yes  ?  Alcohol/week: 0.0 standard drinks  ?  Comment: rare  ? Drug use: No  ?  Sexual activity: Never  ?  Birth control/protection: Surgical, Post-menopausal  ?  Comment: 1st intercourse 88 yo-Fewer than 5 partners  ?Other Topics Concern  ? Not on file  ?Social History Narrative  ? Not on file  ? ?Social Determinants of Health  ? ?Financial Resource Strain: Not on file  ?Food Insecurity: Not on file  ?Transportation Needs: Not on file  ?Physical Activity: Not on file  ?Stress: Not on file  ?Social Connections: Not on file  ? ?Allergies  ?Allergen Reactions  ? Sulfa Antibiotics Rash  ? Biaxin [Clarithromycin] Other (See Comments)  ?  Pt does not remember reaction ?  ? Ciprofloxacin Nausea And Vomiting  ? Hctz [Hydrochlorothiazide] Other (See Comments)  ?  weakness and hyponatremia  ? Macrobid [Nitrofurantoin] Nausea Only  ? Other Nausea Only  ? Prednisone Other (See Comments)  ?  Swelling and facial redness ?  ? Sulfamethoxazole-Trimethoprim Other (See Comments) and Rash  ?  Sore throat  ? ? ?Medications  ?(Not in a hospital admission) ?  ? ?Vitals  ? ?Vitals:  ? 04/19/21 1045 04/19/21 1115 04/19/21 1130 04/19/21 1200  ?BP: (!) 130/103 (!) 180/98 (!) 158/95 (!) 176/84  ?Pulse: 68 64 66 68  ?Resp: '20 16 17 19  '$ ?Temp:      ?TempSrc:      ?SpO2: 97% 98% 96% 100%  ?  ? ?There is no height or weight on file to calculate BMI. ? ?Physical Exam  ? ?General: lying supine in bed, loud mouth-breathing ?HENT: Normal oropharynx and mucosa. Normal external appearance of ears and nose.   ?Neck: Supple, no pain or tenderness  ?CV: No peripheral edema.  ?Pulmonary: Symmetric Chest rise. Normal respiratory effort.  ?Ext: No cyanosis, edema, or deformity  ?Skin: No rash. Normal palpation of skin.   ? ?Neurologic Examination  ?Mental status/Cognition: awakens to painful stimulus, engages briefly for exam, able to state location, disoriented to who the president is Shon Millet").  ?Speech/language: muffled and strained, no dysarthria ?Cranial nerves:  ? CN II Pupils equal and reactive to light, no VF deficits    ? CN III,IV,VI Unable to test  ? CN V Unable to test  ? CN VII Unable to test  ? CN VIII normal hearing to speech   ? CN IX & X Unable to test  ? CN XI Unable to test  ? CN XII Unable to test  ? ?Motor:  ?Muscle bulk: decreased  ?Able to squeeze examiner's finger with the right hand, unable to lift right arm  ?Diffuse spasticity  ? ?Reflexes: ? Right Left Comments  ?Pectoralis     ? Biceps (C5/6) 2+ 2+   ?  Brachioradialis (C5/6)     ? Triceps (C6/7)     ? Patellar (L3/4) 2+ 2+   ? Achilles (S1)     ? Hoffman     ? Plantar     ?Jaw jerk   ? ?Sensation: ? Light touch Withdraws to pain x4  ? Pin prick   ? Temperature   ? Vibration   ?Proprioception   ? ?Coordination/Complex Motor:  ?- Finger to Nose Unable to test ?- Heel to shin Unable to test ?- Rapid alternating movement Unable to test ?- Gait: deferred ? ?Labs  ? ?CBC:  ?Recent Labs  ?Lab 04/19/21 ?1010  ?WBC 5.4  ?HGB 12.7  ?HCT 38.7  ?MCV 96.5  ?PLT 201  ? ? ?Basic Metabolic Panel:  ?Lab Results  ?Component Value Date  ? NA 138 04/19/2021  ? K 3.5 04/19/2021  ? CO2 25 04/19/2021  ? GLUCOSE 95 04/19/2021  ? BUN 26 (H) 04/19/2021  ? CREATININE 0.88 04/19/2021  ? CALCIUM 8.6 (L) 04/19/2021  ? GFRNONAA >60 04/19/2021  ? GFRAA >60 09/03/2019  ? ?Lipid Panel: No results found for: [place] ?HgbA1c:  ?Lab Results  ?Component Value Date  ? HGBA1C 5.6 10/15/2013  ? ?Urine Drug Screen: No results found for: LABOPIA, COCAINSCRNUR, [place], [place], THCU, LABBARB  ?Alcohol Level No results found for: ETH ? ?CT Head without contrast(: ?IMPRESSION: ?1. No evidence of acute intracranial abnormality. ?2. Moderate chronic small vessel ischemic disease and cerebral ?atrophy. ? ? ? ?Impression  ? ?VERNEDA HOLLOPETER is a 87 y.o. female with a past medical history of Parkinson disease, presumed mild cognitive impairment versus dementia, Hx breast cancer, Hx thyroid cancer with resulting hypothyroidism, hypertension, and overactive bladder.  She was brought to the emergency department  today for evaluation of encephalopathy.  ? ?Potential seizure high on the differential given the abrupt nature of mental status change and the blood on her lips.  Some contribution from vitamin deficiency is

## 2021-04-19 NOTE — ED Notes (Signed)
MRI called and patient remains unable to lay still for test, Hospitalist aware and another order for 0.'5mg'$  Ativan given. ?

## 2021-04-19 NOTE — Progress Notes (Signed)
EEG COMPLETE, PENDING RESULTS. ?

## 2021-04-19 NOTE — ED Notes (Signed)
Pt unable to lay still for MRI, Hospitalist aware. Order for Ativan 0.'5mg'$   received.  ?

## 2021-04-19 NOTE — ED Notes (Signed)
Report called to [facility] ?

## 2021-04-19 NOTE — ED Notes (Signed)
Got patient into a gown on the monitor did ekg shown to Dr Jeanell Sparrow got patient a warm blanket patient is resting with call bell in reach  ?

## 2021-04-19 NOTE — Assessment & Plan Note (Signed)
On admission blood pressures were noted to elevated up to 180/98.  Home blood pressure regimen includes irbesartan 37.5 mg daily nightly. ?-Continue irbesartan ?-Hydralazine IV as needed for elevated blood pressure ?

## 2021-04-19 NOTE — ED Provider Notes (Signed)
?[facility] ?Provider Note ? ? ?CSN: [id number] ?Arrival date & time: 04/19/21  2831 ? ?  ? ?History ? ?Chief Complaint  ?Patient presents with  ? Altered Mental Status  ? ? ?Michelle Sims is a 88 y.o. female. ? ?HPI ?Patient is poor historian due to her history of Parkinson's and inability to recall recent events or give detailed history ?History obtained from RN via EMS ?History obtained from husband at bedside ?88 year old female with a history of Parkinson's who is reported to have had an episode of decreased responsiveness this morning.  Her husband states that during the night she got up and was helped to the bathroom which was normal for her.  When he awoke this morning at around 7 AM she was "Catty corner in the bed".  He states he tried to awaken her but was unable to.  When their nurse got there at 8 AM, she was able to wake her up.  Patient's blood sugar was reported as normal.  She was noted to have some dried blood on her lips questionably being secondary to biting her lips.  Her husband states she was seen by her primary care doctor on Thursday and it was thought that she was somewhat dehydrated.  They have been cutting back on her irbesartan and she has decreased from 75 to 37.5 mg.  Husband states that her blood pressure has been running low.  No other recent medication changes have been noted ?  ? ?Home Medications ?Prior to Admission medications   ?Medication Sig Start Date End Date Taking? Authorizing Provider  ?Ascorbic Acid (VITAMIN C PO) Take by mouth.    [provider]  ?aspirin EC 81 MG tablet Take 81 mg by mouth daily.    [provider]  ?carbidopa-levodopa (SINEMET IR) 25-100 MG tablet Take by mouth. 1 in am, 1 at 11a, 1 at 3p and 1 at 8p    [provider]  ?Carbidopa-Levodopa ER (SINEMET CR) 25-100 MG tablet controlled release TAKE 1 TABLET BY MOUTH EVERYDAY AT BEDTIME 05/30/18   Star Age, MD  ?cholecalciferol  (VITAMIN D) 1000 units tablet Take 2,000 Units by mouth daily.    [provider]  ?co-enzyme Q-10 30 MG capsule Take 30 mg by mouth daily.    [provider]  ?Cranberry 300 MG tablet Take 300 mg by mouth every other day.    [provider]  ?CRANBERRY-VITAMIN C-D MANNOSE PO Take 3 tablets by mouth daily.    [provider]  ?escitalopram (LEXAPRO) 10 MG tablet Take 20 mg by mouth daily.    [provider]  ?irbesartan (AVAPRO) 75 MG tablet Take 75 mg by mouth at bedtime. 11/09/10   [provider]  ?levothyroxine (SYNTHROID, LEVOTHROID) 112 MCG tablet Take 112 mcg by mouth daily before breakfast.    [provider]  ?MYRBETRIQ 25 MG TB24 tablet Take 25-50 mg by mouth daily. 03/24/17   [provider]  ?NONFORMULARY OR COMPOUNDED ITEM Estradiol vag cream 0.02% (0.'2mg'$ /ml)  ?S:  Insert 1 ml (1 syringe) into the vagina hs 2 times weekly as directed. 05/20/19   Huel Cote, NP  ?rivastigmine (EXELON) 9.5 mg/24hr APPLY 1 PATCH ONTO THE SKIN EVERY DAY 03/02/21   Ward Givens, NP  ?   ? ?Allergies    ?Sulfa antibiotics, Biaxin [clarithromycin], Ciprofloxacin, Hctz [hydrochlorothiazide], Macrobid [nitrofurantoin], Other, Prednisone, and Sulfamethoxazole-trimethoprim   ? ?Review of Systems   ?Review of Systems  ?All  other systems reviewed and are negative. ? ?Physical Exam ?Updated Vital Signs ?BP (!) 158/95   Pulse 66   Temp 98.1 ?F (36.7 ?C) (Rectal)   Resp 17   SpO2 96%  ?Physical Exam ?Vitals and nursing note reviewed.  ?Constitutional:   ?   Appearance: Normal appearance.  ?HENT:  ?   Head: Normocephalic.  ?   Right Ear: External ear normal.  ?   Left Ear: External ear normal.  ?   Nose: Nose normal.  ?   Mouth/Throat:  ?   Mouth: Mucous membranes are dry.  ?   Pharynx: Oropharynx is clear.  ?   Comments: Some dried blood noted on lower lip ?Eyes:  ?   Extraocular Movements: Extraocular movements intact.  ?   Pupils: Pupils are equal, round,  and reactive to light.  ?Cardiovascular:  ?   Rate and Rhythm: Normal rate and regular rhythm.  ?Pulmonary:  ?   Effort: Pulmonary effort is normal.  ?   Breath sounds: Normal breath sounds.  ?Abdominal:  ?   General: Abdomen is flat. Bowel sounds are normal.  ?   Palpations: Abdomen is soft.  ?Musculoskeletal:     ?   General: Normal range of motion.  ?   Cervical back: Normal range of motion.  ?Skin: ?   General: Skin is warm and dry.  ?   Capillary Refill: Capillary refill takes less than 2 seconds.  ?Neurological:  ?   General: No focal deficit present.  ?   Mental Status: She is alert.  ?   Cranial Nerves: No cranial nerve deficit.  ?   Coordination: Coordination normal.  ?   Deep Tendon Reflexes: Reflexes normal.  ?   Comments: Some generalized weakness ?No lateralized deficits ?Specifically, no facial asymmetry, no palmar drift, no leg drift noted  ?Psychiatric:     ?   Mood and Affect: Mood normal.  ? ? ?ED Results / Procedures / Treatments   ?Labs ?(all labs ordered are listed, but only abnormal results are displayed) ?Labs Reviewed  ?COMPREHENSIVE METABOLIC PANEL - Abnormal; Notable for the following components:  ?    Result Value  ? BUN 26 (*)   ? Calcium 8.6 (*)   ? Total Protein 6.1 (*)   ? Albumin 3.4 (*)   ? All other components within normal limits  ?CULTURE, BLOOD (SINGLE)  ?CBC  ?PROTIME-INR  ?URINALYSIS, ROUTINE W REFLEX MICROSCOPIC  ?TSH  ? ? ?EKG ?EKG Interpretation ? ?Date/Time:  Monday April 19 2021 09:39:58 EDT ?Ventricular Rate:  73 ?PR Interval:  162 ?QRS Duration: 168 ?QT Interval:  463 ?QTC Calculation: 511 ?R Axis:   -31 ?Text Interpretation: Sinus rhythm Right bundle branch block when compared with ecg of 22 February 2021, nsst changes in v4-v6 have improved Confirmed by Pattricia Boss [phone]) on 04/19/2021 9:53:33 AM ? ?Radiology ?CT Head Wo Contrast ? ?Result Date: 04/19/2021 ?CLINICAL DATA:  Mental status change, unknown cause. EXAM: CT HEAD WITHOUT CONTRAST TECHNIQUE: Contiguous axial  images were obtained from the base of the skull through the vertex without intravenous contrast. RADIATION DOSE REDUCTION: This exam was performed according to the departmental dose-optimization program which includes automated exposure control, adjustment of the mA and/or kV according to patient size and/or use of iterative reconstruction technique. COMPARISON:  Head MRI 08/26/2010 FINDINGS: Brain: There is no evidence of an acute infarct, acute intracranial hemorrhage, mass, or midline shift. There is progressive, moderate cerebral atrophy. Prominent extra-axial CSF spaces over  the frontoparietal convexities are favored to be secondary to cerebral atrophy although very small subdural hygromas are not excluded. Patchy hypodensities in the cerebral white matter bilaterally are nonspecific but compatible with moderate chronic small vessel ischemic disease, also progressed from the prior MRI. Vascular: Calcified atherosclerosis at the skull base. No hyperdense vessel. Skull: No acute fracture or suspicious osseous lesion. Sinuses/Orbits: Visualized paranasal sinuses and mastoid air cells are clear. Bilateral cataract extraction. Other: None. IMPRESSION: 1. No evidence of acute intracranial abnormality. 2. Moderate chronic small vessel ischemic disease and cerebral atrophy. Electronically Signed   By: Logan Bores M.D.   On: 04/19/2021 11:15  ? ?DG Chest Port 1 View ? ?Result Date: 04/19/2021 ?CLINICAL DATA:  Weakness EXAM: PORTABLE CHEST 1 VIEW COMPARISON:  Chest x-Muneer Leider dated September 27, 2013 FINDINGS: Cardiac and mediastinal contours are unchanged when accounting for differences in technique. Lungs are clear. No pleural effusion pneumothorax. IMPRESSION: No active disease. Electronically Signed   By: Yetta Glassman M.D.   On: 04/19/2021 10:32   ? ?Procedures ?Procedures  ? ? ?Medications Ordered in ED ?Medications  ?enoxaparin (LOVENOX) injection 40 mg (has no administration in time range)  ?sodium chloride flush  (NS) 0.9 % injection 3 mL (has no administration in time range)  ?acetaminophen (TYLENOL) tablet 650 mg (has no administration in time range)  ?  Or  ?acetaminophen (TYLENOL) suppository 650 mg (has no administratio

## 2021-04-19 NOTE — Assessment & Plan Note (Signed)
Continue Lexapro

## 2021-04-20 DIAGNOSIS — G9340 Encephalopathy, unspecified: Secondary | ICD-10-CM | POA: Diagnosis not present

## 2021-04-20 DIAGNOSIS — R4182 Altered mental status, unspecified: Secondary | ICD-10-CM | POA: Diagnosis not present

## 2021-04-20 DIAGNOSIS — I10 Essential (primary) hypertension: Secondary | ICD-10-CM | POA: Diagnosis not present

## 2021-04-20 DIAGNOSIS — E890 Postprocedural hypothyroidism: Secondary | ICD-10-CM | POA: Diagnosis not present

## 2021-04-20 DIAGNOSIS — F32A Depression, unspecified: Secondary | ICD-10-CM | POA: Diagnosis not present

## 2021-04-20 LAB — CBC WITH DIFFERENTIAL/PLATELET
Abs Immature Granulocytes: 0.02 10*3/uL (ref 0.00–0.07)
Basophils Absolute: 0 10*3/uL (ref 0.0–0.1)
Basophils Relative: 1 %
Eosinophils Absolute: 0.2 10*3/uL (ref 0.0–0.5)
Eosinophils Relative: 3 %
HCT: 36.3 % (ref 36.0–46.0)
Hemoglobin: 12.3 g/dL (ref 12.0–15.0)
Immature Granulocytes: 0 %
Lymphocytes Relative: 18 %
Lymphs Abs: 1.2 10*3/uL (ref 0.7–4.0)
MCH: 31.9 pg (ref 26.0–34.0)
MCHC: 33.9 g/dL (ref 30.0–36.0)
MCV: 94 fL (ref 80.0–100.0)
Monocytes Absolute: 0.4 10*3/uL (ref 0.1–1.0)
Monocytes Relative: 7 %
Neutro Abs: 4.5 10*3/uL (ref 1.7–7.7)
Neutrophils Relative %: 71 %
Platelets: 202 10*3/uL (ref 150–400)
RBC: 3.86 MIL/uL — ABNORMAL LOW (ref 3.87–5.11)
RDW: 13.2 % (ref 11.5–15.5)
WBC: 6.4 10*3/uL (ref 4.0–10.5)
nRBC: 0 % (ref 0.0–0.2)

## 2021-04-20 LAB — BASIC METABOLIC PANEL
Anion gap: 5 (ref 5–15)
BUN: 24 mg/dL — ABNORMAL HIGH (ref 8–23)
CO2: 25 mmol/L (ref 22–32)
Calcium: 8.5 mg/dL — ABNORMAL LOW (ref 8.9–10.3)
Chloride: 108 mmol/L (ref 98–111)
Creatinine, Ser: 0.78 mg/dL (ref 0.44–1.00)
GFR, Estimated: >60 mL/min (ref >60)
Glucose, Bld: 98 mg/dL (ref 70–99)
Potassium: 3.2 mmol/L — ABNORMAL LOW (ref 3.5–5.1)
Sodium: 138 mmol/L (ref 135–145)

## 2021-04-20 LAB — VITAMIN B12: Vitamin B-12: 779 pg/mL (ref 180–914)

## 2021-04-20 LAB — TSH: TSH: 1.635 u[IU]/mL (ref 0.350–4.500)

## 2021-04-20 MED ORDER — POLYETHYLENE GLYCOL 3350 17 G PO PACK: 17.0000 g | PACK | Freq: Every day | ORAL | 0 refills | Status: DC

## 2021-04-20 MED ORDER — POTASSIUM CHLORIDE 20 MEQ PO PACK: 40.0000 meq | PACK | Freq: Once | ORAL | Status: DC

## 2021-04-20 MED ORDER — POLYETHYLENE GLYCOL 3350 17 G PO PACK: 17.0000 g | PACK | Freq: Every day | ORAL | Status: DC

## 2021-04-20 NOTE — Progress Notes (Signed)
?NEUROLOGY CONSULTATION PROGRESS NOTE  ? ?Date of service: April 20, 2021 ?Patient Name: Michelle Sims ?MRN:  [id number] ?DOB:  [birth year] ? ?Brief HPI  ?Michelle Sims is a 88 y.o. female with a past medical history of Parkinson disease, presumed mild cognitive impairment versus dementia, Hx breast cancer, Hx thyroid cancer with resulting hypothyroidism, hypertension, and overactive bladder.  She was admitted for evaluation of encephalopathy.  ?  ?Interval Hx  ? ?On assessment this morning, patient appears similar to attending's evaluation yesterday. She is alert and oriented to location and is able to state who the president is. Husband reports patient is back to baseline and is requesting D/C. Patient and husband updated on assessment and care plan as below.  ? ?Vitals  ? ?Vitals:  ? 04/20/21 0520 04/20/21 0710 04/20/21 0819 04/20/21 1059  ?BP:   (!) 145/127 (!) 152/80  ?Pulse:   71 72  ?Resp:   19 19  ?Temp:   97.7 ?F (36.5 ?C)   ?TempSrc:   Oral   ?SpO2:  97% 99% 99%  ?Height: '5\' 3"'$  (1.6 m)     ?  ? ?Body mass index is 21.21 kg/m?. ? ?Physical Exam  ? ?General: Lying comfortably in bed; in no acute distress.  ?HENT: Normal oropharynx and mucosa. Normal external appearance of ears and nose.  ?Neck: Supple, no pain or tenderness  ?CV: No peripheral edema.  ?Pulmonary: Symmetric Chest rise. Normal respiratory effort.  ?Ext: No cyanosis, edema, or deformity  ?Skin: No rash. Normal palpation of skin.   ?  ?  ?Neurologic Examination  ?Mental status/Cognition: Alert, oriented to self, place and president ?Speech/language: non-spontaneous, answers with short phrases ?Cranial nerves:  ? CN II Pupils equal and reactive to light, no VF deficits   ? CN III,IV,VI EOM intact, no gaze preference or deviation, no nystagmus   ? CN V normal sensation in V1, V2, and V3 segments bilaterally   ? CN VII no asymmetry, no nasolabial fold flattening   ? CN VIII normal hearing to speech   ? CN IX & X normal palatal elevation, no  uvular deviation   ? CN XI 5/5 shoulder shrug bilaterally   ? CN XII midline tongue protrusion   ?  ?Motor:  ?Mvmt Root Nerve  Muscle Right Left Comments  ?SA C5/6 Ax Deltoid 4/5 4/5    ?EF C5/6 Mc Biceps 4/5 4/5    ?EE C6/7/8 Rad Triceps 4/5 4/5    ?WF C6/7 Med FCR      ?WE C7/8 PIN ECU      ?F Ab C8/T1 U ADM/FDI      ?HF L1/2/3 Fem Illopsoas      ?KE L2/3/4 Fem Quad      ?DF L4/5 D Peron Tib Ant        ?PF S1/2 Tibial Grc/Sol        ?  ?Reflexes: ?  Right Left Comments  ?Pectoralis        ? Biceps (C5/6) 2+ 2+    ?Brachioradialis (C5/6)      ? Triceps (C6/7)      ? Patellar (L3/4) 2+ 2+    ? Achilles (S1)      ? Hoffman        ? Plantar        ?Jaw jerk     ?  ?Sensation: ? Light touch intact  ? Pin prick    ? Temperature    ? Vibration    ?Proprioception    ?  ?  Coordination/Complex Motor:  ?- Finger to Nose intact ?- Heel to shin intact ?- Gait: deferred ? ? ?Labs  ? ?Basic Metabolic Panel:  ?Lab Results  ?Component Value Date  ? NA 138 04/20/2021  ? K 3.2 (L) 04/20/2021  ? CO2 25 04/20/2021  ? GLUCOSE 98 04/20/2021  ? BUN 24 (H) 04/20/2021  ? CREATININE 0.78 04/20/2021  ? CALCIUM 8.5 (L) 04/20/2021  ? GFRNONAA >60 04/20/2021  ? GFRAA >60 09/03/2019  ? ?HbA1c:  ?Lab Results  ?Component Value Date  ? HGBA1C 5.6 10/15/2013  ? ?LDL: No results found for: LDLCALC ?Urine Drug Screen: No results found for: LABOPIA, COCAINSCRNUR, [place], [place], THCU, LABBARB  ?Alcohol Level No results found for: ETH ?No results found for: PHENYTOIN, ZONISAMIDE, LAMOTRIGINE, LEVETIRACETA ?No results found for: PHENYTOIN, PHENOBARB, VALPROATE, CBMZ ? ?Imaging and Diagnostic studies  ? ?CT Head without contrast: ?IMPRESSION: ?1. No evidence of acute intracranial abnormality. ?2. Moderate chronic small vessel ischemic disease and cerebral ?atrophy. ? ?MRI-Brain: ?IMPRESSION: ?1. Moderately motion limited study without evidence of acute ?intracranial abnormality. ?2. Moderate chronic microvascular ischemic disease and cerebral ?atrophy  (ICD10-G31.9). ? ?Impression  ? ?Michelle Sims is a 88 y.o. female with a past medical history of Parkinson disease, presumed mild cognitive impairment versus dementia, Hx breast cancer, Hx thyroid cancer with resulting hypothyroidism, hypertension, and overactive bladder.  She was brought to the emergency department today for evaluation of encephalopathy.  ? ?Stroke ruled out with negative MRI brain.  EEG negative for seizure, though this is still formally possible.  Even if the patient did have a seizure, feel that the risks of initiating antiepileptic treatment outweigh the benefits given the patient's age.  This can certainly be considered if the patient comes in with another presentation suggestive of seizure. ? ?Recommendations  ? ?-No further workup on an inpatient basis ?-Neurology will no longer follow but will be available for questions ? ? ?Corky Sox, MD ?PGY-1 ? ? ?Attending Neurologist's note: ? ?Patient is fully back to baseline at this time.  She is able to provide additional history that she frequently inadvertently bites her tongue while eating or chewing something.  She has no tongue lesions and no tongue pain.  Therefore the blood on her lips and teeth is not concerning for potential seizure activity to me.  With negative MRI and EEG as detailed above, no indication to start antiseizure medications at this time. ? ?I personally saw this patient, gathering history, performing a neurologic examination, reviewing relevant labs, personally reviewing relevant imaging including MRI brain, and formulated the assessment and plan, adding to the excellent note above for completeness and clarity to accurately reflect my thoughts ? ?Greater than 25 minutes were spent in care of this patient today, greater than 50% of bedside, independent of time spent supervising the resident ? ?

## 2021-04-20 NOTE — Progress Notes (Signed)
Pt and husband requested External Cath because of incontinence and not wanting to wet the bed. Nurse educated and husband still wanted to talk to Doctor about getting on placed. MD consulted and and orders given.   ?

## 2021-04-20 NOTE — Progress Notes (Deleted)
Neurology Attending Attestation ?  ?I discussed plan with Dr. Corky Sox resident and edited the note below to reflect my findings and recommendations. MRI brain wwo contrast motion degraded but with that caveat showed no acute abnormalities. rEEG showed moderate diffuse slowing, no epileptiform abnl. Patient is back to baseline. No indication for further inpatient neurologic workup. Patient may f/u with established outpatient neurologist. ?  ?Su Monks, MD ?[facility] ?[phone] ?  ?If 7pm- 7am, please page neurology on call as listed in [place]. ? ? ?NEUROLOGY CONSULTATION PROGRESS NOTE  ? ?Date of service: April 20, 2021 ?Patient Name: Michelle Sims ?MRN:  [id number] ?DOB:  05/03/1929 ? ?Brief HPI  ?DHARA SCHEPP is a 88 y.o. female with a past medical history of Parkinson disease, presumed mild cognitive impairment versus dementia, Hx breast cancer, Hx thyroid cancer with resulting hypothyroidism, hypertension, and overactive bladder.  She was admitted for evaluation of encephalopathy.  ?  ?Interval Hx  ? ?On assessment this morning, patient appears similar to attending's evaluation yesterday. She is alert and oriented to location and is able to state who the president is. Husband reports patient is back to baseline and is requesting D/C. Patient and husband updated on assessment and care plan as below.  ? ?Vitals  ? ?Vitals:  ? 04/20/21 0520 04/20/21 0710 04/20/21 0819 04/20/21 1059  ?BP:   (!) 145/127 (!) 152/80  ?Pulse:   71 72  ?Resp:   19 19  ?Temp:   97.7 ?F (36.5 ?C)   ?TempSrc:   Oral   ?SpO2:  97% 99% 99%  ?Height: '5\' 3"'$  (1.6 m)     ?  ? ?Body mass index is 21.21 kg/m?. ? ?Physical Exam  ? ?General: Lying comfortably in bed; in no acute distress.  ?HENT: Normal oropharynx and mucosa. Normal external appearance of ears and nose.  ?Neck: Supple, no pain or tenderness  ?CV: No peripheral edema.  ?Pulmonary: Symmetric Chest rise. Normal respiratory effort.  ?Ext: No cyanosis,  edema, or deformity  ?Skin: No rash. Normal palpation of skin.   ?  ?  ?Neurologic Examination  ?Mental status/Cognition: Alert, oriented to self, place and president ?Speech/language: non-spontaneous, answers with short phrases ?Cranial nerves:  ? CN II Pupils equal and reactive to light, no VF deficits   ? CN III,IV,VI EOM intact, no gaze preference or deviation, no nystagmus   ? CN V normal sensation in V1, V2, and V3 segments bilaterally   ? CN VII no asymmetry, no nasolabial fold flattening   ? CN VIII normal hearing to speech   ? CN IX & X normal palatal elevation, no uvular deviation   ? CN XI 5/5 shoulder shrug bilaterally   ? CN XII midline tongue protrusion   ?  ?Motor:  ?Mvmt Root Nerve  Muscle Right Left Comments  ?SA C5/6 Ax Deltoid 4/5 4/5    ?EF C5/6 Mc Biceps 4/5 4/5    ?EE C6/7/8 Rad Triceps 4/5 4/5    ?WF C6/7 Med FCR      ?WE C7/8 PIN ECU      ?F Ab C8/T1 U ADM/FDI      ?HF L1/2/3 Fem Illopsoas      ?KE L2/3/4 Fem Quad      ?DF L4/5 D Peron Tib Ant        ?PF S1/2 Tibial Grc/Sol        ?  ?Reflexes: ?  Right Left Comments  ?Pectoralis        ?  Biceps (C5/6) 2+ 2+    ?Brachioradialis (C5/6)      ? Triceps (C6/7)      ? Patellar (L3/4) 2+ 2+    ? Achilles (S1)      ? Hoffman        ? Plantar        ?Jaw jerk     ?  ?Sensation: ? Light touch intact  ? Pin prick    ? Temperature    ? Vibration    ?Proprioception    ?  ?Coordination/Complex Motor:  ?- Finger to Nose intact ?- Heel to shin intact ?- Gait: deferred ? ? ?Labs  ? ?Basic Metabolic Panel:  ?Lab Results  ?Component Value Date  ? NA 138 04/20/2021  ? K 3.2 (L) 04/20/2021  ? CO2 25 04/20/2021  ? GLUCOSE 98 04/20/2021  ? BUN 24 (H) 04/20/2021  ? CREATININE 0.78 04/20/2021  ? CALCIUM 8.5 (L) 04/20/2021  ? GFRNONAA >60 04/20/2021  ? GFRAA >60 09/03/2019  ? ?HbA1c:  ?Lab Results  ?Component Value Date  ? HGBA1C 5.6 10/15/2013  ? ?LDL: No results found for: LDLCALC ?Urine Drug Screen: No results found for: LABOPIA, COCAINSCRNUR, [place], [place],  THCU, LABBARB  ?Alcohol Level No results found for: ETH ?No results found for: PHENYTOIN, ZONISAMIDE, LAMOTRIGINE, LEVETIRACETA ?No results found for: PHENYTOIN, PHENOBARB, VALPROATE, CBMZ ? ?Imaging and Diagnostic studies  ? ?CT Head without contrast: ?IMPRESSION: ?1. No evidence of acute intracranial abnormality. ?2. Moderate chronic small vessel ischemic disease and cerebral ?atrophy. ? ?MRI-Brain: ?IMPRESSION: ?1. Moderately motion limited study without evidence of acute ?intracranial abnormality. ?2. Moderate chronic microvascular ischemic disease and cerebral ?atrophy (ICD10-G31.9). ? ?Impression  ? ?BRIGID VANDEKAMP is a 88 y.o. female with a past medical history of Parkinson disease, presumed mild cognitive impairment versus dementia, Hx breast cancer, Hx thyroid cancer with resulting hypothyroidism, hypertension, and overactive bladder.  She was brought to the emergency department today for evaluation of encephalopathy.  ? ?Stroke ruled out with negative MRI brain.  EEG negative for seizure, but this is still possible.  Even if the patient did have a seizure, feel that the risks of initiating antiepileptic treatment outweigh the benefits given the patient's age.  This can certainly be considered if the patient comes in with another presentation suggestive of seizure. ? ?Recommendations  ? ?-No further workup ?-Neurology will no longer follow but will be available for questions ? ? ?Corky Sox, MD ?PGY-1 ? ?

## 2021-04-20 NOTE — TOC Initial Note (Addendum)
Transition of Care (TOC) - Initial/Assessment Note  ? ? ?Patient Details  ?Name: Michelle Sims ?MRN: [id number] ?Date of Birth: [birth year] ? ?Transition of Care [facility]) CM/SW Contact:    ?Joanne Chars, LCSW ?Phone Number: ?04/20/2021, 2:57 PM ? ?Clinical Narrative:     CSW met with pt and husband [name].  They are residents at [facility] and plan already made for pt to return to their rehab unit.  Permission given to speak with husband present.  Husband planning to transport pt back to [facility] himself.   ? ?CSW spoke with Butch Penny at [facility] and she confirmed plan, can accept pt today, just needs FL2, no passr needed.            ? ? ?Expected Discharge Plan: [place] ?Barriers to Discharge: No Barriers Identified ? ? ?Patient Goals and CMS Choice ?Patient states their goals for this hospitalization and ongoing recovery are:: back to normal ?CMS Medicare.gov Compare Post Acute Care list provided to::  (NA: pt is [facility] resident and wants to attend rehab there) ?  ? ?Expected Discharge Plan and Services ?Expected Discharge Plan: [place] ?In-house Referral: Clinical Social Work ?  ?Post Acute Care Choice: [place] ?Living arrangements for the past 2 months: [place] ?Expected Discharge Date: 04/20/21               ?  ?  ?  ?  ?  ?  ?  ?  ?  ?  ? ?Prior Living Arrangements/Services ?Living arrangements for the past 2 months: [place] ?Lives with:: Spouse ?Patient language and need for interpreter reviewed:: Yes ?Do you feel safe going back to the place where you live?: Yes      ?Need for Family Participation in Patient Care: Yes (Comment) ?Care giver support system in place?: Yes (comment) ?Current home services: Homehealth aide ?Criminal Activity/Legal Involvement Pertinent to Current Situation/Hospitalization: No - Comment as needed ? ?Activities of Daily Living ?Home Assistive Devices/Equipment: Gilford Rile  (specify type) ?ADL Screening (condition at time of admission) ?Patient's cognitive ability adequate to safely complete daily activities?: No ?Is the patient deaf or have difficulty hearing?: No ?Does the patient have difficulty seeing, even when wearing glasses/contacts?: No ?Does the patient have difficulty concentrating, remembering, or making decisions?: Yes ?Patient able to express need for assistance with ADLs?: Yes ?Does the patient have difficulty dressing or bathing?: Yes ?Independently performs ADLs?: No ?Communication: Independent ?Dressing (OT): Needs assistance ?Is this a change from baseline?: Pre-admission baseline ?Grooming: Needs assistance ?Is this a change from baseline?: Pre-admission baseline ?Feeding: Independent ?Bathing: Needs assistance ?Is this a change from baseline?: Pre-admission baseline ?Toileting: Needs assistance ?Is this a change from baseline?: Pre-admission baseline ?In/Out Bed: Needs assistance ?Is this a change from baseline?: Pre-admission baseline ?Walks in Home: Needs assistance ?Is this a change from baseline?: Pre-admission baseline ?Does the patient have difficulty walking or climbing stairs?: Yes ?Weakness of Legs: Both ?Weakness of Arms/Hands: Both ? ?Permission Sought/Granted ?Permission sought to share information with : Family Supports ?Permission granted to share information with : Yes, Verbal Permission Granted ? Share Information with NAME: husband [name] ? Permission granted to share info w AGENCY: [facility] ?   ?   ? ?Emotional Assessment ?Appearance:: Appears stated age ?Attitude/Demeanor/Rapport: Engaged ?Affect (typically observed): Pleasant, Appropriate ?Orientation: : Oriented to Self, Oriented to Place, Oriented to  Time, Oriented to Situation ?Alcohol / Substance Use: Not Applicable ?Psych Involvement: No (comment) ? ?Admission diagnosis:  Acute encephalopathy [G93.40] ?Altered mental status, unspecified altered mental status type [R41.82] ?Patient Active  Problem List  ? Diagnosis Date Noted  ? Acute encephalopathy 04/19/2021  ? Parkinson's disease (tremor, stiffness, slow motion, unstable posture) ([place]) 11/13/2014  ? Piriformis syndrome of left side 05/08/2014  ? DDD (degenerative disc disease), lumbosacral 03/25/2014  ? Abnormality of gait 03/25/2014  ? Hypothyroidism, postsurgical history of thyroid cancer 10/30/2013  ? RBBB 10/07/2013  ? Left shoulder pain 12/15/2011  ? Postmenopause atrophic vaginitis 11/16/2011  ? At high risk for falls 04/20/2011  ? Blood type, Rh negative   ? Thyroid cancer ([place])   ? Endocervical polyp   ? Osteoarthrosis, unspecified whether generalized or localized, involving lower leg 05/12/2009  ? DEGENERATIVE DISC DISEASE, CERVICAL SPINE, W/RADICULOPATHY 04/30/2009  ? BACK PAIN, THORACIC REGION, CHRONIC 04/30/2009  ? Depression 11/14/2007  ? Essential hypertension 11/14/2007  ? CELLULITIS 11/14/2007  ? BREAST CANCER, HX OF 11/14/2007  ? THYROID CANCER, HX OF 11/14/2007  ? ?PCP:  Ward Givens, NP ?Pharmacy:   ?[id number] [place] [place] - [address] ?[id number]?Phone: [phone]: [phone]? ?[facility] [address] [facility] ?[id number]?Phone: [phone]: [phone]? ?[address]?Phone: [phone]: [phone]? ? ? ? ?Social Determinants of Health (SDOH) Interventions ?  ? ?Readmission Risk Interventions ?   ? View : No data to display.  ?  ?  ?  ? ? ? ?

## 2021-04-20 NOTE — Evaluation (Signed)
Physical Therapy Evaluation ?Patient Details ?Name: Michelle Sims ?MRN: [id number] ?DOB: May 09, 1929 ?Today's Date: 04/20/2021 ? ?History of Present Illness ? Pt is a 88 y/o F presenting to ED ([facility]) from [place] with increased weakness and AMS. PMH includes Parkinson's disease, HTN, GERD, IBS, and arthritis  ?Clinical Impression ? Pt was seen for mobility on side of bed but is asymptomatically hypotensive.  Pt cannot organize motor planning currently to sit reliably or to stand, but with direct assistance is safer.  Recommend SNF despite PLOF with help at home for ADL's due to her hypotension, her weakness now for standing and walking and her struggle to maintain focus on her task at hand.  Husband is very available, plans to retire fully to assist her at home.  Follow acutely for goals of PT with focus on standing balance and transfers as her vitals permit.   ?   ? ?Recommendations for follow up therapy are one component of a multi-disciplinary discharge planning process, led by the attending physician.  Recommendations may be updated based on patient status, additional functional criteria and insurance authorization. ? ?Follow Up Recommendations Skilled nursing-short term rehab (<3 hours/day) ? ?  ?Assistance Recommended at Discharge Frequent or constant Supervision/Assistance  ?Patient can return home with the following ? Two people to help with walking and/or transfers;A lot of help with bathing/dressing/bathroom;Direct supervision/assist for medications management;Direct supervision/assist for financial management;Assistance with feeding;Assist for transportation ? ?  ?Equipment Recommendations None recommended by PT  ?Recommendations for Other Services ?    ?  ?Functional Status Assessment Patient has had a recent decline in their functional status and demonstrates the ability to make significant improvements in function in a reasonable and predictable amount of time.  ? ?  ?Precautions / Restrictions  Precautions ?Precautions: Fall ?Precaution Comments: watch BP ?Restrictions ?Weight Bearing Restrictions: No  ? ?  ? ?Mobility ? Bed Mobility ?Overal bed mobility: Needs Assistance ?Bed Mobility: Supine to Sit, Sit to Supine ?  ?  ?Supine to sit: Mod assist ?Sit to supine: Max assist ?  ?General bed mobility comments: relatively passive about back to bed, perhaps was more fatigued ?  ? ?Transfers ?Overall transfer level: Needs assistance ?Equipment used: Rolling walker (2 wheels), 1 person hand held assist ?Transfers: Sit to/from Stand ?Sit to Stand: Mod assist ?  ?  ?  ?  ?  ?General transfer comment: mod assist with poor compliance with RW but direct assist was complete to stand although unable to sidestep ?  ? ?Ambulation/Gait ?  ?  ?  ?  ?  ?  ?  ?General Gait Details: unable to step on command ? ?Stairs ?  ?  ?  ?  ?  ? ?Wheelchair Mobility ?  ? ?Modified Rankin (Stroke Patients Only) ?  ? ?  ? ?Balance Overall balance assessment: Needs assistance ?Sitting-balance support: Bilateral upper extremity supported, Feet supported ?Sitting balance-Leahy Scale: Fair ?  ?  ?Standing balance support: During functional activity ?Standing balance-Leahy Scale: Poor ?  ?  ?  ?  ?  ?  ?  ?  ?  ?  ?  ?  ?   ? ? ? ?Pertinent Vitals/Pain Pain Assessment ?Pain Assessment: No/denies pain  ? ? ?Home Living Family/patient expects to be discharged to:: Skilled nursing facility ?  ?  ?  ?  ?  ?  ?  ?  ?  ?Additional Comments: From home [place] with husband at [facility]  ?  ?Prior Function Prior  Level of Function : Needs assist ?  ?  ?  ?Physical Assist : Mobility (physical) ?Mobility (physical): Gait ?  ?Mobility Comments: RW inside rollator going out ?ADLs Comments: Aide for care at home, husband is there otherwise ?  ? ? ?Hand Dominance  ? Dominant Hand: Right ? ?  ?Extremity/Trunk Assessment  ? Upper Extremity Assessment ?Upper Extremity Assessment: Defer to OT evaluation ?  ? ?Lower Extremity Assessment ?Lower Extremity Assessment:  Generalized weakness (primarily hips) ?  ? ?Cervical / Trunk Assessment ?Cervical / Trunk Assessment: Kyphotic  ?Communication  ? Communication: No difficulties  ?Cognition Arousal/Alertness: Awake/alert, Lethargic ?Behavior During Therapy: Flat affect ?Overall Cognitive Status: Impaired/Different from baseline ?Area of Impairment: Problem solving, Awareness, Safety/judgement, Following commands, Memory ?  ?  ?  ?  ?  ?  ?  ?  ?Orientation Level: Time, Situation ?Current Attention Level: Selective ?Memory: Decreased recall of precautions, Decreased short-term memory ?Following Commands: Follows one step commands with increased time, Follows one step commands inconsistently ?Safety/Judgement: Decreased awareness of safety, Decreased awareness of deficits ?Awareness: Intellectual ?Problem Solving: Slow processing, Requires verbal cues, Requires tactile cues ?General Comments: `sat well on side of bed initially then let herself drift back on the bed a bit ?  ?  ? ?  ?General Comments General comments (skin integrity, edema, etc.): asymptomatic on side of bed and standing for BP ? ?  ?Exercises    ? ?Assessment/Plan  ?  ?PT Assessment Patient needs continued PT services  ?PT Problem List Decreased strength;Decreased range of motion;Decreased activity tolerance;Decreased balance;Decreased mobility;Decreased cognition;Decreased coordination;Decreased knowledge of use of DME;Decreased safety awareness;Cardiopulmonary status limiting activity ? ?   ?  ?PT Treatment Interventions DME instruction;Gait training;Functional mobility training;Therapeutic activities;Therapeutic exercise;Balance training;Neuromuscular re-education;Patient/family education   ? ?PT Goals (Current goals can be found in the Care Plan section)  ?Acute Rehab PT Goals ?Patient Stated Goal: did not state ?PT Goal Formulation: With family ?Time For Goal Achievement: 05/04/21 ?Potential to Achieve Goals: Good ? ?  ?Frequency Min 2X/week ?  ? ? ?Co-evaluation    ?  ?  ?  ?  ? ? ?  ?AM-PAC PT "6 Clicks" Mobility  ?Outcome Measure Help needed turning from your back to your side while in a flat bed without using bedrails?: A Lot ?Help needed moving from lying on your back to sitting on the side of a flat bed without using bedrails?: A Lot ?Help needed moving to and from a bed to a chair (including a wheelchair)?: Total ?Help needed standing up from a chair using your arms (e.g., wheelchair or bedside chair)?: Total ?Help needed to walk in hospital room?: Total ?Help needed climbing 3-5 steps with a railing? : Total ?6 Click Score: 8 ? ?  ?End of Session Equipment Utilized During Treatment: Gait belt ?Activity Tolerance: Patient limited by fatigue;Treatment limited secondary to medical complications (Comment) ?Patient left: in bed;with call bell/phone within reach;with bed alarm set;with family/visitor present ?Nurse Communication: Mobility status ?PT Visit Diagnosis: Unsteadiness on feet (R26.81);Muscle weakness (generalized) (M62.81);Other (comment) (hypotensive) ?  ? ?Time: 5038-8828 ?PT Time Calculation (min) (ACUTE ONLY): 31 min ? ? ?Charges:   PT Evaluation ?$PT Eval Moderate Complexity: 1 Mod ?PT Treatments ?$Therapeutic Activity: 8-22 mins ?  ?   ? ?Ramond Dial ?04/20/2021, 12:40 PM ? ?Mee Hives, PT PhD ?Acute Rehab Dept. Number: [facility] [phone] and [place] [phone] ? ? ?

## 2021-04-20 NOTE — Evaluation (Signed)
Occupational Therapy Evaluation ?Patient Details ?Name: Michelle Sims ?MRN: [id number] ?DOB: 1929-12-03 ?Today's Date: 04/20/2021 ? ? ?History of Present Illness Pt is a 88 y/o F presenting to ED ([facility]) from [place] with increased weakness and AMS. PMH includes Parkinson's disease, HTN, GERD, IBS, and arthritis  ? ?Clinical Impression ?  ?Pt/spouse report pt requires assistance at baseline for ADLs and uses RW/Rollator for mobility. Pt and spouse live in [place]) and pt has an aide that comes 4x/week to assist with bathing, dressing, and IADLs. Spouse reports he or the aide is always with the pt 24/7, she is never left alone. Pt currently disoriented to place and time, increased time for recall and following cues during session. Pt mod -max A for ADLs, mod-max A for bed mobility, and max A for sit to stand attempt x1 with RW. Standing attempts limited due to pt needing to have BM using bedpan. Pt presenting with impairments listed below, will follow acutely. Recommend SNF at d/c, spouse states they would prefer to stay at [facility]. ?   ? ?Recommendations for follow up therapy are one component of a multi-disciplinary discharge planning process, led by the attending physician.  Recommendations may be updated based on patient status, additional functional criteria and insurance authorization.  ? ?Follow Up Recommendations ? Skilled nursing-short term rehab (<3 hours/day)  ?  ?Assistance Recommended at Discharge Intermittent Supervision/Assistance  ?Patient can return home with the following Two people to help with walking and/or transfers;A lot of help with bathing/dressing/bathroom;Assistance with feeding;Assistance with cooking/housework;Assist for transportation;Help with stairs or ramp for entrance;Direct supervision/assist for medications management;Direct supervision/assist for financial management ? ?  ?Functional Status Assessment ? Patient has had a recent decline in their functional status and  demonstrates the ability to make significant improvements in function in a reasonable and predictable amount of time.  ?Equipment Recommendations ? None recommended by OT;Other (comment) (defer to next venue of care)  ?  ?Recommendations for Other Services PT consult ? ? ?  ?Precautions / Restrictions Precautions ?Precautions: Fall;Other (comment) ?Precaution Comments: watch BP ?Restrictions ?Weight Bearing Restrictions: No  ? ?  ? ?Mobility Bed Mobility ?Overal bed mobility: Needs Assistance ?Bed Mobility: Supine to Sit, Sit to Supine ?  ?  ?Supine to sit: Mod assist ?Sit to supine: Max assist ?  ?General bed mobility comments: to scoot hips/negotiate BLE into bed ?  ? ?Transfers ?Overall transfer level: Needs assistance ?Equipment used: Rolling walker (2 wheels) ?Transfers: Sit to/from Stand ?Sit to Stand: +2 physical assistance, Max assist ?  ?  ?  ?  ?  ?General transfer comment: unable to lift hips from bed, standing attemps limited due to pt needing to have BM/urinate ?  ? ?  ?Balance Overall balance assessment: Needs assistance ?Sitting-balance support: Bilateral upper extremity supported ?Sitting balance-Leahy Scale: Fair ?Sitting balance - Comments: can cross legs/reach down towards feet for LB dressing ?  ?Standing balance support: During functional activity, Reliant on assistive device for balance ?Standing balance-Leahy Scale: Zero ?  ?  ?  ?  ?  ?  ?  ?  ?  ?  ?  ?  ?   ? ?ADL either performed or assessed with clinical judgement  ? ?ADL Overall ADL's : Needs assistance/impaired ?Eating/Feeding: Minimal assistance;Sitting;Bed level ?  ?Grooming: Minimal assistance;Bed level;Sitting ?  ?Upper Body Bathing: Moderate assistance;Sitting;Bed level ?  ?Lower Body Bathing: Sitting/lateral leans;Bed level;Maximal assistance ?  ?Upper Body Dressing : Moderate assistance;Sitting;Bed level ?  ?Lower Body Dressing:  Bed level;Adhering to back precautions;Moderate assistance ?Lower Body Dressing Details (indicate cue  type and reason): to don socks ?Toilet Transfer: Maximal assistance;+2 for physical assistance;BSC/3in1;Stand-pivot;Rolling walker (2 wheels) ?  ?Toileting- Clothing Manipulation and Hygiene: Maximal assistance;Bed level ?  ?  ?  ?Functional mobility during ADLs: Maximal assistance ?   ? ? ? ?Vision Baseline Vision/History: 1 Wears glasses ?Vision Assessment?: No apparent visual deficits  ?   ?Perception   ?  ?Praxis   ?  ? ?Pertinent Vitals/Pain Pain Assessment ?Pain Assessment: No/denies pain  ? ? ? ?Hand Dominance Right ?  ?Extremity/Trunk Assessment Upper Extremity Assessment ?Upper Extremity Assessment: Generalized weakness ?  ?Lower Extremity Assessment ?Lower Extremity Assessment: Defer to PT evaluation ?  ?  ?  ?Communication Communication ?Communication: No difficulties ?  ?Cognition Arousal/Alertness: Lethargic ?Behavior During Therapy: Flat affect ?Overall Cognitive Status: Impaired/Different from baseline ?Area of Impairment: Orientation, Memory, Attention, Following commands ?  ?  ?  ?  ?  ?  ?  ?  ?Orientation Level: Place, Time ?Current Attention Level: Selective ?Memory: Decreased short-term memory ?Following Commands: Follows one step commands with increased time, Follows one step commands inconsistently ?  ?  ?  ?General Comments: requiring heavy verbal an tactile cues throughout session ?  ?  ?General Comments  pt orthostatic with sitting EOB, BP WNL once returned to supine/after LB exercise sitting EOB ? ?  ?Exercises Exercises: General Lower Extremity ?General Exercises - Lower Extremity ?Long Arc Quad: AROM, Both, 5 reps, Seated ?  ?Shoulder Instructions    ? ? ?Home Living Family/patient expects to be discharged to:: Skilled nursing facility ?  ?  ?  ?  ?  ?  ?  ?  ?  ?  ?  ?  ?  ?  ?  ?  ?Additional Comments: Pt from [place], wants pt to receive therapy there ?  ? ?  ?Prior Functioning/Environment Prior Level of Function : Independent/Modified Independent ?  ?  ?  ?  ?  ?  ?Mobility  Comments: uses RW in aparment, uses rollator outside of apartment ?ADLs Comments: has aide that comes to assist with showers, IADLs, dressing, comes 4x/week, pt has 24/7 supervision from either spouse or aide ?  ? ?  ?  ?OT Problem List:   ?  ?   ?OT Treatment/Interventions: Self-care/ADL training;Therapeutic exercise;Energy conservation;DME and/or AE instruction;Therapeutic activities;Patient/family education;Balance training  ?  ?OT Goals(Current goals can be found in the care plan section) Acute Rehab OT Goals ?Patient Stated Goal: none stated ?OT Goal Formulation: With patient ?Time For Goal Achievement: 05/04/21 ?Potential to Achieve Goals: Fair ?ADL Goals ?Pt Will Perform Grooming: with set-up;bed level;sitting ?Pt Will Perform Upper Body Dressing: with min assist;sitting;bed level ?Pt Will Perform Lower Body Dressing: with min assist;sitting/lateral leans;sit to/from stand ?Pt Will Transfer to Toilet: with mod assist;bedside commode;stand pivot transfer  ?OT Frequency: Min 2X/week ?  ? ?Co-evaluation   ?  ?  ?  ?  ? ?  ?AM-PAC OT "6 Clicks" Daily Activity     ?Outcome Measure Help from another person eating meals?: A Little ?Help from another person taking care of personal grooming?: A Little ?Help from another person toileting, which includes using toliet, bedpan, or urinal?: Total ?Help from another person bathing (including washing, rinsing, drying)?: Total ?Help from another person to put on and taking off regular upper body clothing?: A Lot ?Help from another person to put on and taking off regular lower body clothing?: A  Lot ?6 Click Score: 12 ?  ?End of Session Equipment Utilized During Treatment: Gait belt;Rolling walker (2 wheels) ?Nurse Communication: Mobility status;Other (comment) (spouse's concerns regarding pt's need for miralax) ? ?Activity Tolerance: Patient limited by fatigue ?Patient left: in bed;with call bell/phone within reach;with bed alarm set;with family/visitor present ? ?OT Visit  Diagnosis: Unsteadiness on feet (R26.81);Other abnormalities of gait and mobility (R26.89);Muscle weakness (generalized) (M62.81)  ?              ?Time: 5638-7564 ?OT Time Calculation (min): 54 min ?Charges

## 2021-04-20 NOTE — TOC Transition Note (Signed)
Transition of Care (TOC) - CM/SW Discharge Note ? ? ?Patient Details  ?Name: MAZELL AYLESWORTH ?MRN: [id number] ?Date of Birth: November 05, 1929 ? ?Transition of Care [facility]) CM/SW Contact:  ?Joanne Chars, LCSW ?Phone Number: ?04/20/2021, 3:02 PM ? ? ?Clinical Narrative:  Pt discharging to [facility] unit.  RN call report to [phone].  Husband to transport pt himself.    ? ? ? ?Final next level of care: [place] ?Barriers to Discharge: No Barriers Identified ? ? ?Patient Goals and CMS Choice ?Patient states their goals for this hospitalization and ongoing recovery are:: back to normal ?CMS Medicare.gov Compare Post Acute Care list provided to::  (NA: pt is [facility] resident and wants to attend rehab there) ?  ? ?Discharge Placement ?  ?           ?Patient chooses bed at:  [facility]) ?Patient to be transferred to facility by: husband ?Name of family member notified: husband [name] in room ?Patient and family notified of of transfer: 04/20/21 ? ?Discharge Plan and Services ?In-house Referral: Clinical Social Work ?  ?Post Acute Care Choice: [place]          ?  ?  ?  ?  ?  ?  ?  ?  ?  ?  ? ?Social Determinants of Health (SDOH) Interventions ?  ? ? ?Readmission Risk Interventions ?   ? View : No data to display.  ?  ?  ?  ? ? ? ? ? ?

## 2021-04-20 NOTE — Progress Notes (Signed)
Called to [facility] to give report two time [phone] phone ring and no answer both times 1513 and 1540.  ?

## 2021-04-20 NOTE — Discharge Summary (Signed)
? ?Physician Discharge Summary  ?Michelle Sims NAT:[id number] DOB: 1929/05/29 DOA: 04/19/2021 ? ?PCP: Ward Givens, NP ? ?Admit date: 04/19/2021 ?Discharge date: 04/20/2021 ? ?Admitted From: [place] independent living facility ?Discharge disposition: [facility] ? ?Recommendations at discharge:  ?Encourage oral hydration. ? ?Brief narrative: ?Michelle Sims is a 88 y.o. female with PMH significant for HTN, Parkinson's disease, thyroid cancer s/p resection with postsurgical hypothyroidism, anxiety, depression, and breast cancer  ?Patient was brought to ED from [facility] on 3/27 with progressively worsening weakness for 3 days.  EMS noted her supine in bed, with altered mental status and shallow respiration. ? ?Per report, patient is reliant on her husband for all ADLs but able to feed herself, normally alert and oriented to place and person, not to time.  For 24 hours prior to presentation, patient was unable to eat or drink anything ?Patient has had progressive decline in mental and physical status in last couple of years with her gait and balance being significantly affected. ? ?In the ED, patient was afebrile, hemodynamically stable ?Urinalysis unremarkable. ?BUN/creatinine ratio elevated to 26/0.8 ?Chest x-ray normal ?CT head negative for any acute abnormality ?MRI brain did not show any acute abnormality, showed moderate chronic small vessel ischemic disease and brain atrophy. ?Blood culture was obtained ?Kept in observation to hospitalist service ? ?Subjective: ?Patient was seen and examined this morning.  Pleasant elderly Caucasian female.  Alert, awake, demented at baseline.  Husband at bedside.  Patient is not restless or agitated. ?Overnight, blood pressure elevated to 150s to 180s ?Labs this morning with potassium low at 3.2 ? ?Principal Problem: ?  Acute encephalopathy ?Active Problems: ?  Essential hypertension ?  Depression ?  THYROID CANCER, HX OF ?  Hypothyroidism,  postsurgical history of thyroid cancer ?  Parkinson's disease (tremor, stiffness, slow motion, unstable posture) ([place]) ?  ?Assessment and Plan: ?Acute encephalopathy ?-Brought into ED for decreased responsiveness.  There was initial concern of seizure as well as patient was noted to have dried blood in the lips.   ?-Neurology was consulted.   ?-Brain imaging as well as EEG unremarkable.   ?-BUN/creatinine ratio was elevated suggestive of dehydration.   ?-Adequately hydrated.  Encourage oral hydration.  Patient's husband states that she has to be constantly reminded to drink water. ? ?Parkinson's disease ?-Continue carbidopa, levodopa and rivastigmine ? ?Progressive physical and mental decline ?-Per report, patient has been progressively declining over the years in her gait, balance and ability to perform activities of daily living ?-Remains full code.  Palliative care consultation recommended as an outpatient. ? ?Essential hypertension ?-PTA, patient was on irbesartan.  Continue the same.  I would not target strict blood pressure control at her age.  Okay to have systolic blood pressure in 150s. ? ?history of thyroid cancer s/p resection ?Hypothyroidism, postsurgical ?-Continue levothyroxine ? ?Depression ?-Continue Lexapro ? ? ?Wounds:  ?- ?Incision (Closed) 10/07/13 Leg Right (Active)  ?Date First Assessed/Time First Assessed: 10/07/13 1127   Location: Leg  Location Orientation: Right  ?  ?Assessments 10/07/2013 12:35 PM 10/10/2013  7:45 AM  ?Dressing Type Compression wrap Compression wrap  ?Dressing Clean;Dry;Intact Clean;Dry;Intact  ?Drainage Amount -- None  ?   ?No Linked orders to display  ? ? ?Discharge Exam:  ? ?Vitals:  ? 04/20/21 0520 04/20/21 0710 04/20/21 0819 04/20/21 1059  ?BP:   (!) 145/127 (!) 152/80  ?Pulse:   71 72  ?Resp:   19 19  ?Temp:   97.7 ?F (36.5 ?C)   ?  TempSrc:   Oral   ?SpO2:  97% 99% 99%  ?Height: '5\' 3"'$  (1.6 m)     ? ? ?Body mass index is 21.21 kg/m?.  ?General exam: Pleasant, elderly  Caucasian female.  Not in physical distress ?Skin: No rashes, lesions or ulcers. ?HEENT: Atraumatic, normocephalic, no obvious bleeding ?Lungs: Clear to auscultation bilaterally ?CVS: Regular rate and rhythm, no murmur ?GI/Abd soft, nontender, nondistended, bowel sound present ?CNS: Alert, awake, oriented to place and person.  Demented.  Not restless or agitated ?Psychiatry: Cheerfully demented ?Extremities: No pedal edema, no calf tenderness ? ?Follow ups:  ? ? Follow-up Information   ? ? Ward Givens, NP Follow up.   ?Specialty: Neurology ?Contact information: ?[address] ?[address] ?[place] [address] ?[phone] ? ? ?  ?  ? ?  ?  ? ?  ? ? ?Discharge Instructions:  ? ?Discharge Instructions   ? ? Call MD for:  difficulty breathing, headache or visual disturbances   Complete by: As directed ?  ? Call MD for:  extreme fatigue   Complete by: As directed ?  ? Call MD for:  hives   Complete by: As directed ?  ? Call MD for:  persistant dizziness or light-headedness   Complete by: As directed ?  ? Call MD for:  persistant nausea and vomiting   Complete by: As directed ?  ? Call MD for:  severe uncontrolled pain   Complete by: As directed ?  ? Call MD for:  temperature >100.4   Complete by: As directed ?  ? Diet general   Complete by: As directed ?  ? Discharge instructions   Complete by: As directed ?  ? Encourage oral hydration. ? ?General discharge instructions: ?Follow with Primary MD Ward Givens, NP in 7 days  ?Please request your PCP  to go over your hospital tests, procedures, radiology results at the follow up. Please get your medicines reviewed and adjusted.  Your PCP may decide to repeat certain labs or tests as needed. ?Do not drive, operate heavy machinery, perform activities at heights, swimming or participation in water activities or provide baby sitting services if your were admitted for syncope or siezures until you have seen by Primary MD or a Neurologist and advised to do so again. ?[place] Controlled Substance Reporting System database was reviewed. Do not drive, operate heavy machinery, perform activities at heights, swim, participate in water activities or provide baby-sitting services while on medications for pain, sleep and mood until your outpatient physician has reevaluated you and advised to do so again.  You are strongly recommended to comply with the dose, frequency and duration of prescribed medications. ?Activity: As tolerated with Full fall precautions use walker/cane & assistance as needed ?Avoid using any recreational substances like cigarette, tobacco, alcohol, or non-prescribed drug. ?If you experience worsening of your admission symptoms, develop shortness of breath, life threatening emergency, suicidal or homicidal thoughts you must seek medical attention immediately by calling 911 or calling your MD immediately  if symptoms less severe. ?You must read complete instructions/literature along with all the possible adverse reactions/side effects for all the medicines you take and that have been prescribed to you. Take any new medicine only after you have completely understood and accepted all the possible adverse reactions/side effects.  ?Wear Seat belts while driving. ?You were cared for by a hospitalist during your hospital stay. If you have any questions about your discharge medications or the care you received while you were in  the hospital after you are discharged, you can call the unit and ask to speak with the hospitalist or the covering physician. Once you are discharged, your primary care physician will handle any further medical issues. Please note that NO REFILLS for any discharge medications will be authorized once you are discharged, as it is imperative that you return to your primary care physician (or establish a relationship with a primary care physician if you do not have one).  ? Increase activity slowly   Complete by: As directed ?  ? ?  ? ? ?Discharge  Medications:  ? ?Allergies as of 04/20/2021   ? ?   Reactions  ? Sulfa Antibiotics Rash  ? Biaxin [clarithromycin] Other (See Comments)  ? Pt does not remember reaction  ? Ciprofloxacin Nausea And Vomiting  ? Hctz

## 2021-04-20 NOTE — NC FL2 (Signed)
?[place] MEDICAID FL2 LEVEL OF CARE SCREENING TOOL  ?  ? ?IDENTIFICATION  ?Patient Name: ?Michelle Sims Birthdate: [birth year] Sex: female Admission Date (Current Location): ?04/19/2021  ?[place] and [place] Number: ? [place] ?  Facility and Address:  ?[facility], [place] [address] [place], [place] [address] ?     Provider Number: ?[id number]  ?Attending Physician Name and Address:  ?Terrilee Croak, MD ? Relative Name and Phone Number:  ?Zari, Cly [phone] ?   ?Current Level of Care: ?Hospital Recommended Level of Care: ?[place] Prior Approval Number: ?  ? ?Date Approved/Denied: ?  PASRR Number: ?  ? ?Discharge Plan: ?SNF ?  ? ?Current Diagnoses: ?Patient Active Problem List  ? Diagnosis Date Noted  ? Acute encephalopathy 04/19/2021  ? Parkinson's disease (tremor, stiffness, slow motion, unstable posture) ([place]) 11/13/2014  ? Piriformis syndrome of left side 05/08/2014  ? DDD (degenerative disc disease), lumbosacral 03/25/2014  ? Abnormality of gait 03/25/2014  ? Hypothyroidism, postsurgical history of thyroid cancer 10/30/2013  ? RBBB 10/07/2013  ? Left shoulder pain 12/15/2011  ? Postmenopause atrophic vaginitis 11/16/2011  ? At high risk for falls 04/20/2011  ? Blood type, Rh negative   ? Thyroid cancer ([place])   ? Endocervical polyp   ? Osteoarthrosis, unspecified whether generalized or localized, involving lower leg 05/12/2009  ? DEGENERATIVE DISC DISEASE, CERVICAL SPINE, W/RADICULOPATHY 04/30/2009  ? BACK PAIN, THORACIC REGION, CHRONIC 04/30/2009  ? Depression 11/14/2007  ? Essential hypertension 11/14/2007  ? CELLULITIS 11/14/2007  ? BREAST CANCER, HX OF 11/14/2007  ? THYROID CANCER, HX OF 11/14/2007  ? ? ?Orientation RESPIRATION BLADDER Height & Weight   ?  ?Self, Time, Situation, Place ? Normal Incontinent, External catheter Weight:   ?Height:  '5\' 3"'$  (160 cm)  ?BEHAVIORAL SYMPTOMS/MOOD NEUROLOGICAL BOWEL NUTRITION STATUS  ?     Continent Diet (see discharge summary)  ?AMBULATORY STATUS COMMUNICATION OF NEEDS Skin   ?Total Care Verbally Normal ?  ?  ?  ?    ?     ?     ? ? ?Personal Care Assistance Level of Assistance  ?Bathing, Feeding, Dressing Bathing Assistance: Maximum assistance ?Feeding assistance: Limited assistance ?Dressing Assistance: Limited assistance ?   ? ?Functional Limitations Info  ?Sight, Hearing, Speech Sight Info: Adequate ?Hearing Info: Adequate ?Speech Info: Adequate  ? ? ?SPECIAL CARE FACTORS FREQUENCY  ?PT (By licensed PT), OT (By licensed OT)   ?  ?PT Frequency: 5x week ?OT Frequency: 5x week ?  ?  ?  ?   ? ? ?Contractures Contractures Info: Not present  ? ? ?Additional Factors Info  ?Code Status, Allergies Code Status Info: full ?Allergies Info: Sulfa Antibiotics, Biaxin (Clarithromycin), Ciprofloxacin, Hctz (Hydrochlorothiazide), Macrobid (Nitrofurantoin), Other, Prednisone, Sulfamethoxazole-trimethoprim ?  ?  ?  ?   ? ?Current Medications (04/20/2021):  This is the current hospital active medication list ?Current Facility-Administered Medications  ?Medication Dose Route Frequency Provider Last Rate Last Admin  ? 0.9 %  sodium chloride infusion   Intravenous Continuous Fuller Plan A, MD 75 mL/hr at 04/19/21 2153 New Bag at 04/19/21 2153  ? acetaminophen (TYLENOL) tablet 650 mg  650 mg Oral Q6H PRN Norval Morton, MD   650 mg at 04/20/21 3893  ? Or  ? acetaminophen (TYLENOL) suppository 650 mg  650 mg Rectal Q6H PRN Fuller Plan A, MD      ? albuterol (PROVENTIL) (2.5 MG/3ML) 0.083% nebulizer solution 2.5 mg  2.5 mg Nebulization  Q6H PRN Norval Morton, MD      ? aspirin EC tablet 81 mg  81 mg Oral Daily Fuller Plan A, MD   81 mg at 04/20/21 1607  ? carbidopa-levodopa (SINEMET IR) 25-100 MG per tablet immediate release 1 tablet  1 tablet Oral QID Norval Morton, MD   1 tablet at 04/20/21 0844  ? Carbidopa-Levodopa ER (SINEMET CR) 25-100 MG tablet controlled release 1 tablet  1 tablet Oral QHS Smith,  Rondell A, MD      ? enoxaparin (LOVENOX) injection 40 mg  40 mg Subcutaneous Q24H Tamala Julian, Rondell A, MD   40 mg at 04/20/21 0845  ? escitalopram (LEXAPRO) tablet 20 mg  20 mg Oral QHS Smith, Rondell A, MD   20 mg at 04/19/21 2310  ? hydrALAZINE (APRESOLINE) injection 10 mg  10 mg Intravenous Q4H PRN Fuller Plan A, MD   10 mg at 04/19/21 2208  ? irbesartan (AVAPRO) tablet 37.5 mg  37.5 mg Oral QHS Smith, Rondell A, MD   37.5 mg at 04/20/21 0847  ? levothyroxine (SYNTHROID) tablet 112 mcg  112 mcg Oral QAC breakfast Norval Morton, MD   112 mcg at 04/20/21 0636  ? mirabegron ER (MYRBETRIQ) tablet 50 mg  50 mg Oral Daily Smith, Rondell A, MD      ? ondansetron (ZOFRAN) tablet 4 mg  4 mg Oral Q6H PRN Fuller Plan A, MD      ? Or  ? ondansetron (ZOFRAN) injection 4 mg  4 mg Intravenous Q6H PRN Smith, Rondell A, MD      ? polyethylene glycol (MIRALAX / GLYCOLAX) packet 17 g  17 g Oral Daily Dahal, Binaya, MD      ? potassium chloride (KLOR-CON) packet 40 mEq  40 mEq Oral Once Terrilee Croak, MD      ? rivastigmine (EXELON) 9.5 mg/24hr 9.5 mg  9.5 mg Transdermal Daily Tamala Julian, Rondell A, MD   9.5 mg at 04/20/21 0849  ? sodium chloride flush (NS) 0.9 % injection 3 mL  3 mL Intravenous Q12H Smith, Rondell A, MD   3 mL at 04/20/21 0849  ? ? ? ?Discharge Medications: ?Please see discharge summary for a list of discharge medications. ? ?Relevant Imaging Results: ? ?Relevant Lab Results: ? ? ?Additional Information ?SSN: [id number] ? ?Joanne Chars, LCSW ? ? ? ? ?

## 2021-04-20 NOTE — Progress Notes (Deleted)
? ?NEUROLOGY CONSULTATION PROGRESS NOTE  ? ?Date of service: April 20, 2021 ?Patient Name: Michelle Sims ?MRN:  [id number] ?DOB:  November 09, 1929 ? ?Brief HPI  ?Michelle Sims is a 88 y.o. female with a past medical history of Parkinson disease, presumed mild cognitive impairment versus dementia, Hx breast cancer, Hx thyroid cancer with resulting hypothyroidism, hypertension, and overactive bladder.  She was admitted for evaluation of encephalopathy.  ?  ?Interval Hx  ? ?On assessment this morning, patient appears similar to attending's evaluation yesterday. She is alert and oriented to location and is able to state who the president is. Husband reports patient is back to baseline and is requesting D/C. Patient and husband updated on assessment and care plan as below.  ? ?Vitals  ? ?Vitals:  ? 04/20/21 0520 04/20/21 0710 04/20/21 0819 04/20/21 1059  ?BP:   (!) 145/127 (!) 152/80  ?Pulse:   71 72  ?Resp:   19 19  ?Temp:   97.7 ?F (36.5 ?C)   ?TempSrc:   Oral   ?SpO2:  97% 99% 99%  ?Height: '5\' 3"'$  (1.6 m)     ?  ? ?Body mass index is 21.21 kg/m?. ? ?Physical Exam  ? ?General: Lying comfortably in bed; in no acute distress.  ?HENT: Normal oropharynx and mucosa. Normal external appearance of ears and nose.  ?Neck: Supple, no pain or tenderness  ?CV: No peripheral edema.  ?Pulmonary: Symmetric Chest rise. Normal respiratory effort.  ?Ext: No cyanosis, edema, or deformity  ?Skin: No rash. Normal palpation of skin.   ?  ?  ?Neurologic Examination  ?Mental status/Cognition: Alert, oriented to self, place and president ?Speech/language: non-spontaneous, answers with short phrases ?Cranial nerves:  ? CN II Pupils equal and reactive to light, no VF deficits   ? CN III,IV,VI EOM intact, no gaze preference or deviation, no nystagmus   ? CN V normal sensation in V1, V2, and V3 segments bilaterally   ? CN VII no asymmetry, no nasolabial fold flattening   ? CN VIII normal hearing to speech   ? CN IX & X normal palatal elevation, no  uvular deviation   ? CN XI 5/5 shoulder shrug bilaterally   ? CN XII midline tongue protrusion   ?  ?Motor:  ?Mvmt Root Nerve  Muscle Right Left Comments  ?SA C5/6 Ax Deltoid 4/5 4/5    ?EF C5/6 Mc Biceps 4/5 4/5    ?EE C6/7/8 Rad Triceps 4/5 4/5    ?WF C6/7 Med FCR      ?WE C7/8 PIN ECU      ?F Ab C8/T1 U ADM/FDI      ?HF L1/2/3 Fem Illopsoas      ?KE L2/3/4 Fem Quad      ?DF L4/5 D Peron Tib Ant        ?PF S1/2 Tibial Grc/Sol        ?  ?Reflexes: ?  Right Left Comments  ?Pectoralis        ? Biceps (C5/6) 2+ 2+    ?Brachioradialis (C5/6)      ? Triceps (C6/7)      ? Patellar (L3/4) 2+ 2+    ? Achilles (S1)      ? Hoffman        ? Plantar        ?Jaw jerk     ?  ?Sensation: ? Light touch intact  ? Pin prick    ? Temperature    ? Vibration    ?Proprioception    ?  ?  Coordination/Complex Motor:  ?- Finger to Nose intact ?- Heel to shin intact ?- Gait: deferred ? ? ?Labs  ? ?Basic Metabolic Panel:  ?Lab Results  ?Component Value Date  ? NA 138 04/20/2021  ? K 3.2 (L) 04/20/2021  ? CO2 25 04/20/2021  ? GLUCOSE 98 04/20/2021  ? BUN 24 (H) 04/20/2021  ? CREATININE 0.78 04/20/2021  ? CALCIUM 8.5 (L) 04/20/2021  ? GFRNONAA >60 04/20/2021  ? GFRAA >60 09/03/2019  ? ?HbA1c:  ?Lab Results  ?Component Value Date  ? HGBA1C 5.6 10/15/2013  ? ?LDL: No results found for: LDLCALC ?Urine Drug Screen: No results found for: LABOPIA, COCAINSCRNUR, [place], [place], THCU, LABBARB  ?Alcohol Level No results found for: ETH ?No results found for: PHENYTOIN, ZONISAMIDE, LAMOTRIGINE, LEVETIRACETA ?No results found for: PHENYTOIN, PHENOBARB, VALPROATE, CBMZ ? ?Imaging and Diagnostic studies  ? ?CT Head without contrast: ?IMPRESSION: ?1. No evidence of acute intracranial abnormality. ?2. Moderate chronic small vessel ischemic disease and cerebral ?atrophy. ? ?MRI-Brain: ?IMPRESSION: ?1. Moderately motion limited study without evidence of acute ?intracranial abnormality. ?2. Moderate chronic microvascular ischemic disease and cerebral ?atrophy  (ICD10-G31.9). ? ?Impression  ? ?Michelle Sims is a 88 y.o. female with a past medical history of Parkinson disease, presumed mild cognitive impairment versus dementia, Hx breast cancer, Hx thyroid cancer with resulting hypothyroidism, hypertension, and overactive bladder.  She was brought to the emergency department today for evaluation of encephalopathy.  ? ?Stroke ruled out with negative MRI brain.  EEG negative for seizure, but this is still possible.  Even if the patient did have a seizure, feel that the risks of initiating antiepileptic treatment outweigh the benefits given the patient's age.  This can certainly be considered if the patient comes in with another presentation suggestive of seizure. ? ?Recommendations  ? ?-No further workup ?-Neurology will no longer follow but will be available for questions ? ? ?Corky Sox, MD ?PGY-1 ? ?

## 2021-04-20 NOTE — Procedures (Signed)
Patient Name: Michelle Sims  ?MRN: [id number]  ?Epilepsy Attending: Lora Havens  ?Referring Physician/Provider: Derek Jack, MD ?Date: 04/19/2021 ?Duration: 27.23 mins ? ?Patient history: 88 year old female with altered mental status.  EEG to evaluate for seizure. ? ?Level of alertness: Awake ? ?AEDs during EEG study: None ? ?Technical aspects: This EEG study was done with scalp electrodes positioned according to the 10-20 International system of electrode placement. Electrical activity was acquired at a sampling rate of '500Hz'$  and reviewed with a high frequency filter of '70Hz'$  and a low frequency filter of '1Hz'$ . EEG data were recorded continuously and digitally stored.  ? ?Description: No clear posterior dominant rhythm was seen.  EEG showed continuous generalized predominantly 5 to 6 Hz theta slowing as well as 2 to 3 Hz delta slowing.  Hyperventilation and photic stimulation were not performed.    ? ?ABNORMALITY ?- Continuous slow, generalized ? ?IMPRESSION: ?This study is suggestive of moderate diffuse encephalopathy, nonspecific etiology. No seizures or epileptiform discharges were seen throughout the recording. ? ?Lora Havens  ? ?

## 2021-04-24 LAB — CULTURE, BLOOD (SINGLE)
Culture: NO GROWTH
Special Requests: ADEQUATE

## 2021-04-29 ENCOUNTER — Emergency Department (HOSPITAL_COMMUNITY)
Admission: EM | Admit: 2021-04-29 | Discharge: 2021-04-29 | Disposition: A | Discharge status: Skilled Nursing Facility | Payer: BC Managed Care – PPO | Attending: Emergency Medicine | Admitting: Emergency Medicine

## 2021-04-29 ENCOUNTER — Other Ambulatory Visit: Payer: Self-pay

## 2021-04-29 DIAGNOSIS — Z79899 Other long term (current) drug therapy: Secondary | ICD-10-CM | POA: Insufficient documentation

## 2021-04-29 DIAGNOSIS — F0390 Unspecified dementia without behavioral disturbance: Secondary | ICD-10-CM | POA: Diagnosis not present

## 2021-04-29 DIAGNOSIS — R4189 Other symptoms and signs involving cognitive functions and awareness: Secondary | ICD-10-CM | POA: Diagnosis not present

## 2021-04-29 DIAGNOSIS — R404 Transient alteration of awareness: Secondary | ICD-10-CM | POA: Diagnosis not present

## 2021-04-29 DIAGNOSIS — G20 Parkinson's disease: Secondary | ICD-10-CM | POA: Insufficient documentation

## 2021-04-29 DIAGNOSIS — Z7982 Long term (current) use of aspirin: Secondary | ICD-10-CM | POA: Diagnosis not present

## 2021-04-29 DIAGNOSIS — R4182 Altered mental status, unspecified: Secondary | ICD-10-CM | POA: Diagnosis present

## 2021-04-29 DIAGNOSIS — I10 Essential (primary) hypertension: Secondary | ICD-10-CM | POA: Insufficient documentation

## 2021-04-29 LAB — CBC WITH DIFFERENTIAL/PLATELET
Abs Immature Granulocytes: 0.02 10*3/uL (ref 0.00–0.07)
Basophils Absolute: 0 10*3/uL (ref 0.0–0.1)
Basophils Relative: 1 %
Eosinophils Absolute: 0.2 10*3/uL (ref 0.0–0.5)
Eosinophils Relative: 3 %
HCT: 37.8 % (ref 36.0–46.0)
Hemoglobin: 12.3 g/dL (ref 12.0–15.0)
Immature Granulocytes: 0 %
Lymphocytes Relative: 23 %
Lymphs Abs: 1.3 10*3/uL (ref 0.7–4.0)
MCH: 31.3 pg (ref 26.0–34.0)
MCHC: 32.5 g/dL (ref 30.0–36.0)
MCV: 96.2 fL (ref 80.0–100.0)
Monocytes Absolute: 0.3 10*3/uL (ref 0.1–1.0)
Monocytes Relative: 5 %
Neutro Abs: 4.1 10*3/uL (ref 1.7–7.7)
Neutrophils Relative %: 68 %
Platelets: 194 10*3/uL (ref 150–400)
RBC: 3.93 MIL/uL (ref 3.87–5.11)
RDW: 13 % (ref 11.5–15.5)
WBC: 6 10*3/uL (ref 4.0–10.5)
nRBC: 0 % (ref 0.0–0.2)

## 2021-04-29 LAB — COMPREHENSIVE METABOLIC PANEL
ALT: 17 U/L (ref 0–44)
AST: 18 U/L (ref 15–41)
Albumin: 3.3 g/dL — ABNORMAL LOW (ref 3.5–5.0)
Alkaline Phosphatase: 59 U/L (ref 38–126)
Anion gap: 5 (ref 5–15)
BUN: 26 mg/dL — ABNORMAL HIGH (ref 8–23)
CO2: 26 mmol/L (ref 22–32)
Calcium: 8.4 mg/dL — ABNORMAL LOW (ref 8.9–10.3)
Chloride: 107 mmol/L (ref 98–111)
Creatinine, Ser: 0.87 mg/dL (ref 0.44–1.00)
GFR, Estimated: >60 mL/min (ref >60)
Glucose, Bld: 94 mg/dL (ref 70–99)
Potassium: 3.9 mmol/L (ref 3.5–5.1)
Sodium: 138 mmol/L (ref 135–145)
Total Bilirubin: 0.6 mg/dL (ref 0.3–1.2)
Total Protein: 5.9 g/dL — ABNORMAL LOW (ref 6.5–8.1)

## 2021-04-29 LAB — URINALYSIS, ROUTINE W REFLEX MICROSCOPIC
Bilirubin Urine: NEGATIVE
Glucose, UA: NEGATIVE mg/dL
Hgb urine dipstick: NEGATIVE
Ketones, ur: NEGATIVE mg/dL
Leukocytes,Ua: NEGATIVE
Nitrite: NEGATIVE
Protein, ur: NEGATIVE mg/dL
Specific Gravity, Urine: 1.01 (ref 1.005–1.030)
pH: 5 (ref 5.0–8.0)

## 2021-04-29 LAB — CBG MONITORING, ED: Glucose-Capillary: 88 mg/dL (ref 70–99)

## 2021-04-29 NOTE — ED Notes (Signed)
RN reviewed discharge instructions w/ pt. Follow up reviewed, pt had no further questions 

## 2021-04-29 NOTE — ED Provider Notes (Addendum)
?[facility] ?Provider Note ? ? ?CSN: [id number] ?Arrival date & time: 04/29/21  1221 ? ?  ? ?History ? ?Chief Complaint  ?Patient presents with  ? Altered Mental Status  ? ? ?Michelle Sims is a 88 y.o. female. ? ?Patient with history of Parkinson's, dementia, and hypertension presents today with complaints of AMS. She presents with her husband who is primary caregiver and provides majority of history. States that earlier this morning the patient's husband woke the patient up and was going to help her get dressed for the day, however she told her husband she was unable to get out of bed. Husband states she subsequently went unresponsive for about 45 minutes which cause him to call 911. Upon EMS arrival, patient became responsive again and alert and oriented x3 without any complaints. According to husband she has been having episodes similar to this over the past year with increasing duration. States that originally these episodes would occur for just a few minutes and now they are lasting for 45 minutes to an hour. Presented here on 3/27 for same and was admitted with CT head, MRI brain, and EEG all of which were unremarkable for acute findings.  ? ?The history is provided by the patient. No language interpreter was used.  ?Altered Mental Status ?Associated symptoms: no abdominal pain, no fever, no headaches, no light-headedness, no palpitations, no vomiting and no weakness   ? ?  ? ?Home Medications ?Prior to Admission medications   ?Medication Sig Start Date End Date Taking? Authorizing Provider  ?Ascorbic Acid (VITAMIN C PO) Take 1,000 mg by mouth daily.    [provider]  ?aspirin EC 81 MG tablet Take 81 mg by mouth daily.    [provider]  ?carbidopa-levodopa (SINEMET IR) 25-100 MG tablet Take 1 tablet by mouth 4 (four) times daily. 0800, 1100, 1500, 2000    [provider]  ?Carbidopa-Levodopa ER (SINEMET CR) 25-100 MG tablet controlled  release TAKE 1 TABLET BY MOUTH EVERYDAY AT BEDTIME ?Patient taking differently: Take 1 tablet by mouth at bedtime. 05/30/18   Star Age, MD  ?cholecalciferol (VITAMIN D) 1000 units tablet Take 2,000 Units by mouth daily.    [provider]  ?Coenzyme Q10 300 MG CAPS Take 300 mg by mouth daily.    [provider]  ?Cranberry 300 MG tablet Take 300 mg by mouth daily.    [provider]  ?CRANBERRY-VITAMIN C-D MANNOSE PO Take 3 tablets by mouth daily.    [provider]  ?escitalopram (LEXAPRO) 20 MG tablet Take 20 mg by mouth at bedtime.    [provider]  ?irbesartan (AVAPRO) 75 MG tablet Take 37.5 mg by mouth at bedtime. 11/09/10   [provider]  ?levothyroxine (SYNTHROID, LEVOTHROID) 112 MCG tablet Take 112 mcg by mouth daily before breakfast.    [provider]  ?mirabegron ER (MYRBETRIQ) 50 MG TB24 tablet Take 50 mg by mouth daily. 03/24/17   [provider]  ?NONFORMULARY OR COMPOUNDED ITEM Estradiol vag cream 0.02% (0.'2mg'$ /ml)  ?S:  Insert 1 ml (1 syringe) into the vagina hs 2 times weekly as directed. ?Patient taking differently: Place 1 application. vaginally every other day. Estradiol vag cream 0.02% (0.'2mg'$ /ml)  ?S:  Insert 1 ml (1 syringe) into the vagina hs 2 times weekly as directed. 05/20/19   Huel Cote, NP  ?polyethylene glycol (MIRALAX / GLYCOLAX) 17 g packet Take 17 g by mouth daily. 04/20/21   Terrilee Croak,  MD  ?rivastigmine (EXELON) 9.5 mg/24hr APPLY 1 PATCH ONTO THE SKIN EVERY DAY ?Patient taking differently: Place 9.5 mg onto the skin daily. 03/02/21   Ward Givens, NP  ?   ? ?Allergies    ?Sulfa antibiotics, Biaxin [clarithromycin], Ciprofloxacin, Hctz [hydrochlorothiazide], Macrobid [nitrofurantoin], Other, Prednisone, and Sulfamethoxazole-trimethoprim   ? ?Review of Systems   ?Review of Systems  ?Constitutional:  Negative for chills and fever.  ?Eyes:  Negative for visual disturbance.  ?Respiratory:  Negative for  cough and shortness of breath.   ?Cardiovascular:  Negative for chest pain and palpitations.  ?Gastrointestinal:  Negative for abdominal pain and vomiting.  ?Genitourinary:  Negative for dysuria and hematuria.  ?Neurological:  Negative for dizziness, tremors, syncope, facial asymmetry, speech difficulty, weakness, light-headedness, numbness and headaches.  ?All other systems reviewed and are negative. ? ?Physical Exam ?Updated Vital Signs ?BP (!) 180/98 (BP Location: Right Arm)   Pulse 75   Temp 98 ?F (36.7 ?C) (Oral)   Resp 12   SpO2 97%  ?Physical Exam ?Vitals and nursing note reviewed.  ?Constitutional:   ?   General: She is not in acute distress. ?   Appearance: Normal appearance. She is normal weight. She is not ill-appearing, toxic-appearing or diaphoretic.  ?   Comments: Patient resting comfortably in bed in no acute distress  ?HENT:  ?   Head: Normocephalic and atraumatic.  ?Eyes:  ?   Extraocular Movements: Extraocular movements intact.  ?   Pupils: Pupils are equal, round, and reactive to light.  ?Cardiovascular:  ?   Rate and Rhythm: Normal rate and regular rhythm.  ?   Heart sounds: Normal heart sounds.  ?Pulmonary:  ?   Effort: Pulmonary effort is normal.  ?   Breath sounds: Normal breath sounds.  ?Abdominal:  ?   General: Abdomen is flat.  ?   Palpations: Abdomen is soft.  ?Musculoskeletal:     ?   General: Normal range of motion.  ?   Cervical back: Normal range of motion.  ?   Right lower leg: No edema.  ?   Left lower leg: No edema.  ?Skin: ?   General: Skin is warm and dry.  ?Neurological:  ?   Mental Status: She is alert and oriented to person, place, and time.  ?   GCS: GCS eye subscore is 4. GCS verbal subscore is 5. GCS motor subscore is 6.  ?   Sensory: Sensation is intact.  ?   Motor: Motor function is intact.  ?   Coordination: Coordination is intact.  ?   Gait: Gait is intact.  ?   Comments: Alert and oriented to self, place, time and event.  ?  ?Speech is fluent, clear without  dysarthria or dysphasia.  ?  ?Strength 5/5 in upper/lower extremities   ?Sensation intact in upper/lower extremities  ?  ?CN I not tested  ?CN II grossly intact visual fields bilaterally. Did not visualize posterior eye.  ?CN III, IV, VI PERRLA and EOMs intact bilaterally  ?CN V Intact sensation to sharp and light touch to the face  ?CN VII facial movements symmetric  ?CN VIII not tested  ?CN IX, X no uvula deviation, symmetric rise of soft palate  ?CN XI 5/5 SCM and trapezius strength bilaterally  ?CN XII Midline tongue protrusion, symmetric L/R movements   ?Psychiatric:     ?   Mood and Affect: Mood normal.     ?   Behavior: Behavior normal.  ? ? ?ED  Results / Procedures / Treatments   ?Labs ?(all labs ordered are listed, but only abnormal results are displayed) ?Labs Reviewed  ?COMPREHENSIVE METABOLIC PANEL - Abnormal; Notable for the following components:  ?    Result Value  ? BUN 26 (*)   ? Calcium 8.4 (*)   ? Total Protein 5.9 (*)   ? Albumin 3.3 (*)   ? All other components within normal limits  ?CBC WITH DIFFERENTIAL/PLATELET  ?URINALYSIS, ROUTINE W REFLEX MICROSCOPIC  ?CBG MONITORING, ED  ? ? ?EKG ?EKG Interpretation ? ?Date/Time:  Thursday April 29 2021 12:23:19 EDT ?Ventricular Rate:  70 ?PR Interval:  167 ?QRS Duration: 160 ?QT Interval:  461 ?QTC Calculation: 498 ?R Axis:   -50 ?Text Interpretation: Sinus rhythm RBBB and LAFB Confirmed by Elnora Morrison [phone]) on 04/29/2021 1:07:05 PM ? ?Radiology ?No results found. ? ?Procedures ?Procedures  ? ? ?Medications Ordered in ED ?Medications - No data to display ? ?ED Course/ Medical Decision Making/ A&P ?  ?                        ?Medical Decision Making ?Amount and/or Complexity of Data Reviewed ?Labs: ordered. ? ? ?This patient presents to the ED for concern of AMS, this involves an extensive number of treatment options, and is a complaint that carries with it a high risk of complications and morbidity.  The differential diagnosis includes seizure,  medication reaction, infectious etiology, metabolic derangement, hypoglycemia, other etiologies of hypotension including cardiac. ? ? ?Co morbidities that complicate the patient evaluation ? ?Parkinsons, dementia ? ? ?Additional

## 2021-04-29 NOTE — Discharge Instructions (Signed)
As we discussed, your work-up in the ER is reassuring for acute abnormality today.  You were seen by our neurologist Dr. Leonel Ramsay who messaged your neurologist to been setting up a sleep study to evaluate possible causes of your symptoms.  I recommend calling your neurologist to schedule an appointment soon as possible to have this study and to discuss management of your symptoms moving forward. ? ?Return if development of any new or worsening symptoms. ?

## 2021-04-29 NOTE — ED Triage Notes (Signed)
Pt here via [facility] from [facility] after being difficult to wake up this AM by husband and facility nurse. Upon EMS arrival, pt would open eyes to pain, and would open eyes when EMS was speaking with husband. Upon getting into ambulance pt has been aox3 (baseline hx dementia), following commands and speaking. Pt denies pain. ?

## 2021-04-29 NOTE — Consult Note (Signed)
Neurology Consultation ?Reason for Consult: Difficulty arousing ?Referring Physician: Rosalyn Gess ? ?CC: Difficulty arousing ? ?History is obtained from: Patient, family ? ?HPI: Michelle Sims is a 88 y.o. female with a history of mild dementia, Parkinson's disease who presents with difficulty being awoken this morning.  Initially when he went into the room at about 9:50, she was somewhat responsive, but then he went in a little while later and she had fallen back asleep and was unresponsive.  Her family member describes that he tries to wake her up by slapping her in the face, shaking her, but she was not able to be aroused. ? ?This has been problematic from time to time for over a year, but the duration is getting longer.  He states that it is always when she is laying down, she never has behavioral arrest or episodes concerning for seizure. ? ? ? ?ROS: A 14 point ROS was performed and is negative except as noted in the HPI.  ?Past Medical History:  ?Diagnosis Date  ? Anxiety   ? Arthritis   ? knee & back   ? Blood type, Rh negative   ? Breast cancer ([place])   ? Chronic fatigue   ? Depression   ? Diverticulosis   ? Endocervical polyp   ? GERD (gastroesophageal reflux disease)   ? Hypertension   ? Hypothyroidism   ? IBS (irritable bowel syndrome)   ? Insomnia   ? Lymphedema   ? Neuromuscular disorder ([place])   ? Parkinson's disease   ? Osteopenia 06/2015  ? T score -2.4 distal third of the radius  ? Parkinson disease ([place])   ? RBBB 10/07/2013  ? Thyroid cancer ([place])   ? ? ? ?Family History  ?Problem Relation Age of Onset  ? Hypertension Mother   ? Hyperlipidemia Mother   ? Heart failure Mother   ? Hypertension Father   ? Hyperlipidemia Father   ? Heart failure Father   ? Hyperlipidemia Brother   ? Hypertension Brother   ? Heart attack Brother   ? Breast cancer Neg Hx   ? Parkinson's disease Neg Hx   ? ? ? ?Social History:  reports that she has never smoked. She has never used smokeless tobacco. She reports current  alcohol use. She reports that she does not use drugs. ? ?Exam: ?Current vital signs: ?BP (!) 166/83   Pulse 63   Temp 98 ?F (36.7 ?C) (Oral)   Resp 11   SpO2 98%  ?Vital signs in last 24 hours: ?Temp:  [98 ?F (36.7 ?C)] 98 ?F (36.7 ?C) (04/06 1223) ?Pulse Rate:  [63-75] 63 (04/06 1345) ?Resp:  [11-17] 11 (04/06 1345) ?BP: (148-180)/(75-98) 166/83 (04/06 1345) ?SpO2:  [96 %-98 %] 98 % (04/06 1345) ? ? ?Physical Exam  ?Constitutional: Appears elderly ?Neuro: ?Mental Status: ?Patient is awake, alert, oriented to person, place, month(initially responds July, but when I asked her if she is sure she changes it to April), year, and situation. ?Patient is able to give a clear and coherent history. ?No signs of aphasia or neglect ?Cranial Nerves: ?II: Visual Fields are full. Pupils are equal, round, and reactive to light.   ?III,IV, VI: EOMI without ptosis or diploplia.  ?V: Facial sensation is symmetric to temperature ?VII: Facial movement is symmetric.  ?VIII: hearing is intact to voice ?X: Uvula elevates symmetrically ?XI: Shoulder shrug is symmetric. ?XII: tongue is midline without atrophy or fasciculations.  ?Motor: ?She has 4/5 strength in bilateral legs, good grip  strength bilaterally, good strength in her upper extremities ?Sensory: ?Sensation is symmetric to light touch and temperature in the arms and legs. ? ? ? ? ? ? ?I have reviewed labs in epic and the results pertinent to this consultation are: ?CBG 88 ? ?I have reviewed the images obtained: Previous MRI and EEG were negative ? ?Impression: 88 year old female with recurrent episodes of difficulty being aroused from sleep.  Possible etiologies could include CO2 narcosis if she has some type of central hypoventilation, nocturnal seizure, persistent sleepiness due to poor nighttime sleep.  I think that I would start with a repeat sleep study prior to initiation of any type of antiepileptic empirically, given that the diagnosis is  unclear. ? ?Recommendations: ?1) I have messaged her outpatient neurologist to consider possible sleep study ?2) would not start antiepileptics at this time ?3) follow-up CMP/CBC, defer to ER. ? ? ?Roland Rack, MD ?[facility] ?[phone] ? ?If 7pm- 7am, please page neurology on call as listed in [place]. ? ?

## 2021-04-29 NOTE — ED Provider Notes (Incomplete)
I provided a substantive portion of the care of this patient.  I personally performed the entirety of the history, exam, and medical decision making for this encounter. ?{Remember to document shared critical care using "edcritical" dot phrase:1} ?EKG Interpretation ? ?Date/Time:  Thursday April 29 2021 12:23:19 EDT ?Ventricular Rate:  70 ?PR Interval:  167 ?QRS Duration: 160 ?QT Interval:  461 ?QTC Calculation: 498 ?R Axis:   -50 ?Text Interpretation: Sinus rhythm RBBB and LAFB Confirmed by Elnora Morrison [phone]) on 04/29/2021 1:07:05 PM ?   ?

## 2021-05-14 ENCOUNTER — Other Ambulatory Visit: Payer: Self-pay | Admitting: Adult Health

## 2021-05-23 ENCOUNTER — Other Ambulatory Visit: Payer: Self-pay

## 2021-05-23 ENCOUNTER — Emergency Department (HOSPITAL_COMMUNITY)
Admission: EM | Admit: 2021-05-23 | Discharge: 2021-05-23 | Disposition: A | Discharge status: Skilled Nursing Facility | Payer: BC Managed Care – PPO | Attending: Emergency Medicine | Admitting: Emergency Medicine

## 2021-05-23 ENCOUNTER — Emergency Department (HOSPITAL_COMMUNITY): Payer: BC Managed Care – PPO

## 2021-05-23 ENCOUNTER — Encounter (HOSPITAL_COMMUNITY): Payer: Self-pay

## 2021-05-23 DIAGNOSIS — Z23 Encounter for immunization: Secondary | ICD-10-CM | POA: Diagnosis not present

## 2021-05-23 DIAGNOSIS — W19XXXA Unspecified fall, initial encounter: Secondary | ICD-10-CM | POA: Diagnosis not present

## 2021-05-23 DIAGNOSIS — E039 Hypothyroidism, unspecified: Secondary | ICD-10-CM | POA: Diagnosis not present

## 2021-05-23 DIAGNOSIS — Z79899 Other long term (current) drug therapy: Secondary | ICD-10-CM | POA: Insufficient documentation

## 2021-05-23 DIAGNOSIS — G20 Parkinson's disease: Secondary | ICD-10-CM | POA: Diagnosis not present

## 2021-05-23 DIAGNOSIS — S0990XA Unspecified injury of head, initial encounter: Secondary | ICD-10-CM

## 2021-05-23 DIAGNOSIS — S0101XA Laceration without foreign body of scalp, initial encounter: Secondary | ICD-10-CM | POA: Insufficient documentation

## 2021-05-23 DIAGNOSIS — Z7982 Long term (current) use of aspirin: Secondary | ICD-10-CM | POA: Diagnosis not present

## 2021-05-23 DIAGNOSIS — I10 Essential (primary) hypertension: Secondary | ICD-10-CM | POA: Insufficient documentation

## 2021-05-23 MED ORDER — TETANUS-DIPHTH-ACELL PERTUSSIS 5-2.5-18.5 LF-MCG/0.5 IM SUSY
0.5000 mL | PREFILLED_SYRINGE | Freq: Once | INTRAMUSCULAR | Status: AC
  Administered 2021-05-23: 0.5 mL via INTRAMUSCULAR
  Filled 2021-05-23: qty 0.5

## 2021-05-23 MED ORDER — LIDOCAINE-EPINEPHRINE (PF) 2 %-1:200000 IJ SOLN
20.0000 mL | Freq: Once | INTRAMUSCULAR | Status: DC
  Filled 2021-05-23: qty 20

## 2021-05-23 NOTE — ED Provider Notes (Signed)
?[facility] ?Provider Note ? ? ?CSN: [id number] ?Arrival date & time: 05/23/21  1141 ? ?  ? ?History ? ?Chief Complaint  ?Patient presents with  ? Fall  ? ? ?Michelle Sims is a 88 y.o. female. ? ?HPI ? ?88 year old female with past medical history of Parkinson's disease, HTN, HLD, hypothyroidism presents emergency department mechanical fall and head injury.  Denies being on any anticoagulation.  Report is that patient was standing in the bathroom, brushing her teeth when she had a witnessed mechanical fall hitting her head on the back on the floor.  No reported loss of consciousness.  Patient denies any syncopal symptoms.  No seizure-like activity.  Patient arrives with a c-collar on with a head wrap, obvious bleeding from the posterior aspect of the head.  She is complaining of a mild headache.  Patient and husband at bedside states that she is otherwise been in her usual state of health, no recent fever/illness, medication adjustments or other acute changes. ? ?Home Medications ?Prior to Admission medications   ?Medication Sig Start Date End Date Taking? Authorizing Provider  ?Ascorbic Acid (VITAMIN C PO) Take 1,000 mg by mouth daily.    [provider]  ?aspirin EC 81 MG tablet Take 81 mg by mouth daily.    [provider]  ?carbidopa-levodopa (SINEMET IR) 25-100 MG tablet Take 1 tablet by mouth 4 (four) times daily. 0800, 1100, 1500, 2000    [provider]  ?Carbidopa-Levodopa ER (SINEMET CR) 25-100 MG tablet controlled release TAKE 1 TABLET BY MOUTH EVERYDAY AT BEDTIME ?Patient taking differently: Take 1 tablet by mouth at bedtime. 05/30/18   Star Age, MD  ?cholecalciferol (VITAMIN D) 1000 units tablet Take 2,000 Units by mouth daily.    [provider]  ?Coenzyme Q10 300 MG CAPS Take 300 mg by mouth daily.    [provider]  ?Cranberry 300 MG tablet Take 300 mg by mouth daily.    [provider]   ?CRANBERRY-VITAMIN C-D MANNOSE PO Take 3 tablets by mouth daily.    [provider]  ?escitalopram (LEXAPRO) 20 MG tablet Take 20 mg by mouth at bedtime.    [provider]  ?irbesartan (AVAPRO) 75 MG tablet Take 37.5 mg by mouth at bedtime. 11/09/10   [provider]  ?levothyroxine (SYNTHROID, LEVOTHROID) 112 MCG tablet Take 112 mcg by mouth daily before breakfast.    [provider]  ?mirabegron ER (MYRBETRIQ) 50 MG TB24 tablet Take 50 mg by mouth daily. 03/24/17   [provider]  ?NONFORMULARY OR COMPOUNDED ITEM Estradiol vag cream 0.02% (0.'2mg'$ /ml)  ?S:  Insert 1 ml (1 syringe) into the vagina hs 2 times weekly as directed. ?Patient taking differently: Place 1 application. vaginally every other day. Estradiol vag cream 0.02% (0.'2mg'$ /ml)  ?S:  Insert 1 ml (1 syringe) into the vagina hs 2 times weekly as directed. 05/20/19   Huel Cote, NP  ?polyethylene glycol (MIRALAX / GLYCOLAX) 17 g packet Take 17 g by mouth daily. 04/20/21   Terrilee Croak, MD  ?rivastigmine (EXELON) 9.5 mg/24hr APPLY 1 PATCH ONTO THE SKIN EVERY DAY 05/17/21   Ward Givens, NP  ?   ? ?Allergies    ?Sulfa antibiotics, Biaxin [clarithromycin], Ciprofloxacin, Hctz [hydrochlorothiazide], Macrobid [nitrofurantoin], Other, Prednisone, and Sulfamethoxazole-trimethoprim   ? ?Review of Systems   ?Review of Systems  ?Constitutional:  Negative for fever.  ?Respiratory:  Negative for shortness of breath.   ?Cardiovascular:  Negative for chest pain.  ?  Gastrointestinal:  Negative for abdominal pain, diarrhea and vomiting.  ?Musculoskeletal:  Negative for back pain, neck pain and neck stiffness.  ?Skin:  Positive for wound.  ?Neurological:  Positive for headaches. Negative for weakness and numbness.  ? ?Physical Exam ?Updated Vital Signs ?BP (!) 164/79   Pulse 70   Temp 97.7 ?F (36.5 ?C) (Oral)   Resp 16   Ht '5\' 3"'$  (1.6 m)   Wt 54.3 kg   SpO2 98%   BMI 21.21 kg/m?  ?Physical Exam ?Vitals and nursing note  reviewed.  ?Constitutional:   ?   General: She is not in acute distress. ?   Appearance: Normal appearance.  ?HENT:  ?   Head: Normocephalic.  ?   Comments: Bleeding from posterior scalp ?   Mouth/Throat:  ?   Mouth: Mucous membranes are moist.  ?Eyes:  ?   Pupils: Pupils are equal, round, and reactive to light.  ?Neck:  ?   Comments: C collar in place ?Cardiovascular:  ?   Rate and Rhythm: Normal rate.  ?Pulmonary:  ?   Effort: Pulmonary effort is normal. No respiratory distress.  ?Abdominal:  ?   Palpations: Abdomen is soft.  ?   Tenderness: There is no abdominal tenderness.  ?Musculoskeletal:     ?   General: No swelling or deformity.  ?   Comments: Pelvis stable and nontender, no obvious bony injury/abnormality  ?Skin: ?   General: Skin is warm.  ?Neurological:  ?   Mental Status: She is alert and oriented to person, place, and time. Mental status is at baseline.  ?Psychiatric:     ?   Mood and Affect: Mood normal.  ? ? ?ED Results / Procedures / Treatments   ?Labs ?(all labs ordered are listed, but only abnormal results are displayed) ?Labs Reviewed - No data to display ? ?EKG ?None ? ?Radiology ?No results found. ? ?Procedures ?Marland Kitchen.Laceration Repair ? ?Date/Time: 05/23/2021 5:15 PM ?Performed by: Lorelle Gibbs, DO ?Authorized by: Lorelle Gibbs, DO  ? ?Consent:  ?  Consent obtained:  Verbal ?  Consent given by:  Patient and spouse ?  Risks discussed:  Infection, poor cosmetic result, poor wound healing, need for additional repair and nerve damage ?  Alternatives discussed:  Delayed treatment ?Universal protocol:  ?  Procedure explained and questions answered to patient or proxy's satisfaction: yes   ?  Imaging studies available: yes   ?  Patient identity confirmed:  Verbally with patient and arm band ?Anesthesia:  ?  Anesthesia method:  Local infiltration ?  Local anesthetic:  Lidocaine 2% WITH epi ?Laceration details:  ?  Location:  Scalp ?  Scalp location:  Occipital ?  Length (cm):  8 ?Pre-procedure  details:  ?  Preparation:  Patient was prepped and draped in usual sterile fashion and imaging obtained to evaluate for foreign bodies ?Exploration:  ?  Hemostasis achieved with:  Direct pressure ?Treatment:  ?  Area cleansed with:  Saline ?  Irrigation solution:  Sterile saline ?  Irrigation method:  Pressure wash ?  Debridement:  Moderate ?  Undermining:  Extensive ?Skin repair:  ?  Repair method:  Sutures ?  Suture size:  4-0 ?  Suture material:  Prolene ?  Suture technique:  Simple interrupted ?  Number of sutures:  5 ?Approximation:  ?  Approximation:  Close ?Repair type:  ?  Repair type:  Complex ?Post-procedure details:  ?  Dressing:  Open (no dressing) ?  Procedure completion:  Tolerated  ? ? ?Medications Ordered in ED ?Medications  ?Tdap (BOOSTRIX) injection 0.5 mL (has no administration in time range)  ? ? ?ED Course/ Medical Decision Making/ A&P ?  ?                        ?Medical Decision Making ?Amount and/or Complexity of Data Reviewed ?Radiology: ordered. ? ?Risk ?Prescription drug management. ? ? ?88 year old female presents emergency department with mechanical fall and posterior head injury.  No anticoagulation.  Vitals are stable on arrival.  No recent illness, she is back to baseline.  Complaining of a mild headache, c-collar in place.  No syncope. ? ?CT of the head and neck showed no acute finding/traumatic injury.  C-collar cleared at bedside, no cervical spine tenderness to palpation. ? ?Posterior head scalp took extensive cleaning and soaking to reveal an approximately 8 cm linear laceration to the occipital region, intermittent bleeding but controlled with direct pressure.  No pulsatile/arterial bleeding.  Edges had to be cleaned up and revised but wound was closed and patient tolerated well.  Plan for outpatient follow-up, suture removal and symptomatic treatment. ? ?Patient at this time appears safe and stable for discharge and close outpatient follow up. Discharge plan and strict return  to ED precautions discussed, patient verbalizes understanding and agreement. ? ? ? ? ? ? ? ?Final Clinical Impression(s) / ED Diagnoses ?Final diagnoses:  ?None  ? ? ?Rx / DC Orders ?ED Discharge Orders   ? ? None  ? ?  ? ? ?

## 2021-05-23 NOTE — ED Notes (Signed)
Pt is able to move all extremities. Pt has 2+ radial pulses bilat, 2+ pedal pulses bilat, cap refill less than 3 sec, all extremities warm to touch. Pt denies numbness and tingling.   ?

## 2021-05-23 NOTE — ED Notes (Signed)
I cleaned pt's posterior head w/NS. Pt has a hematoma approx a little bigger than a half dollar w/tissue sticking out of it. ?

## 2021-05-23 NOTE — Discharge Instructions (Signed)
You have been seen and discharged from the emergency department.  The CT of your head and cervical spine did not show any acute injury.  You sustained a large laceration to the scalp.  This was repaired with 5 individual blue sutures.  Please keep this area dry for the next 24 hours.  After that you may get the area wet but avoid any direct exposure to soap/scrubbing.  Pat the area dry.  Over the next 2 days you can cover the laceration with bacitracin/antibiotic ointment.  Following that you may let the wound heal to the air. ? ?The sutures need to be reevaluated and removed in 7 days, your primary doctor may do this for you. ? ?You may experience the following symptoms in regards to head injury/concussion and scalp laceration: Headache, nausea, difficulty with concentration, difficulty sleeping.  Please take Tylenol, up to 1000 mg every 6-8 hours as needed.  Stay well-hydrated.  Your tetanus was updated at this visit. ? ?You may experience a small amount of oozing/bleeding from the laceration site.  This may be normal over the next 24 hours.  If you notice copious amounts of discharge/bleeding, pus or foul smell please be reevaluated for infection. ? ?Follow-up with your primary provider for further evaluation and further care. Take home medications as prescribed. If you have any worsening symptoms or further concerns for your health please return to an emergency department for further evaluation. ?

## 2021-05-23 NOTE — ED Notes (Signed)
Patient transported to CT 

## 2021-05-23 NOTE — ED Triage Notes (Signed)
Pt arrived via [facility] from [place] for a witnessed fall. Per EMS, pt was brushing teeth and lost her balance and fell back and hit posterior head. Per EMS, pt has a large hematoma and 2 lacs on posterior of head. EMS placed dressing on head, but states it's bleeding through. Pt arrived w/c-collar in place. Per EMS, pt denies LOC or blood thinners. Pt is A&Ox4.  ?

## 2021-05-26 ENCOUNTER — Encounter: Payer: Self-pay | Admitting: Adult Health

## 2021-05-26 ENCOUNTER — Ambulatory Visit (INDEPENDENT_AMBULATORY_CARE_PROVIDER_SITE_OTHER): Payer: Medicare Other | Admitting: Adult Health

## 2021-05-26 VITALS — BP 136/81 | HR 79 | Ht 63.0 in | Wt 131.0 lb

## 2021-05-26 DIAGNOSIS — G20 Parkinson's disease: Secondary | ICD-10-CM

## 2021-05-26 DIAGNOSIS — R400 Somnolence: Secondary | ICD-10-CM

## 2021-05-26 DIAGNOSIS — R413 Other amnesia: Secondary | ICD-10-CM

## 2021-05-26 MED ORDER — CARBIDOPA-LEVODOPA ER 25-100 MG PO TBCR: 1.0000 | EXTENDED_RELEASE_TABLET | Freq: Every day | ORAL | 3 refills | Status: AC

## 2021-05-26 NOTE — Progress Notes (Addendum)
? ? ?PATIENT: Michelle Sims ?DOB: October 26, 1929 ? ?REASON FOR VISIT: follow up ?HISTORY FROM: patient ? ?HISTORY OF PRESENT ILLNESS: ?Today 05/26/21: ?Michelle Sims is a 88 year old female with a history of parkinsonism.  She returns today for follow-up.  The patient did go to the hospital with altered mental status.  Her husband reports that he was having a hard time waking her up.  He typically tries to wake her up around 8 AM to take her Synthroid and Sinemet.  Patient typically goes to bed around 8:30 PM.  Work-up at the hospital was relatively unremarkable.  Dr. Leonel Ramsay sent a staff message inquiring about evaluation for CO2 retention in the sleep lab. ? ?Overall they feel that her mobility has declined.  She Needs assistance with ADLS. Uses a WC primarily. They Have decided that she is going to move to skilled nursing due to mobility and cognition.  Her facility is recommending rehab first.  She continues to have difficulty waking up.  Husband states that if he does not try to wake her up she typically gets up around 11 AM.  But she does sleep a lot during the day.  Patient had a fall Sunday and hit her head- went to ED had to have sutures.  ? ? ? 12/07/20: Michelle Sims is an 88 year old female with a history of parkinsonism.  She returns today for follow-up.  Overall the patient feels that her symptoms have progressed.  She has noticed more trouble with her walking.  Specifically when she stands she has a hard time getting started.  Feels that her tremor in the legs have gotten worse.  Denies any trouble swallowing food or liquids.  Reports that she continues to have trouble sleeping.  This has been an ongoing issue.  Denies any changes in her mood or behavior.  States that her PCP started her on Lexapro 10 mg daily.  Husband has noticed the benefit.  She did neurologist at [facility] in June for second opinion.  They suggested that she increased Sinemet to 1-1/2 tablets for the first 2 doses and continue 1  tablet for the last 2 doses.  The patient and her husband has not noticed the benefit.  She also notes trouble with her memory.  Reports that she is forgetful.  She returns today for an evaluation. ? ? 05/26/20: Michelle Sims is an 88 year old female with a history of parkinsonism.  She returns today for follow-up.  She reports that her tremor is only on occasion.  She uses a walker when ambulating.  Denies any recent falls.  Reports that her gait is slow but no other changes.  Reports good appetite.  States that she naps a lot during the day subsequently does not sleep well at night.  She tried increasing Exelon to 13.3 mg but this caused nausea.  She is now back on 9.5 mg.  She continues Sinemet 1 tablet 4 times a day and Sinemet CR at bedtime.  The patient does feel that she is depressed.  Reports most days she has no desire to do activities.  She stays in bed a lot.  Has not discussed her depression with her PCP.  She has a second opinion regarding her parkinsonism June 1 at [facility]  Patient's husband states that they are not working to change practices just want a second opinion. ? ?HISTORY 02/26/2020: She reports feeling more off balance, she has fallen, thankfully without major injuries, she typically falls when she does not use  her walker.  She has a 2 wheeled walker and does not always use it inside the house.  She has ongoing issues with fatigue.  Her husband is concerned about more forgetfulness.  He is wondering if she can increase the Exelon patch.  She is currently on 9.5 mg daily and is able to tolerate this.  Appetite is good, he tries to encourage her to drink more water.  She may not always do well enough.  She does struggle with constipation from time to time and uses MiraLAX as needed.  She also tries to eat prunes for more natural approach to constipation management.  She has been followed by Dr. Mervyn Skeeters at [facility] in [place].  She also had a consultation via telemedicine with Dr. Ida Rogue for  cognitive evaluation and I reviewed the telemedicine visit note from 02/20/2020.  ? ?REVIEW OF SYSTEMS: Out of a complete 14 system review of symptoms, the patient complains only of the following symptoms, and all other reviewed systems are negative. ? ?ALLERGIES: ?Allergies  ?Allergen Reactions  ? Sulfa Antibiotics Rash  ? Biaxin [Clarithromycin] Other (See Comments)  ?  Pt does not remember reaction ?  ? Ciprofloxacin Nausea And Vomiting  ? Hctz [Hydrochlorothiazide] Other (See Comments)  ?  weakness and hyponatremia  ? Macrobid [Nitrofurantoin] Nausea Only  ? Other Nausea Only  ? Prednisone Other (See Comments)  ?  Swelling and facial redness ?  ? Sulfamethoxazole-Trimethoprim Other (See Comments) and Rash  ?  Sore throat  ? ? ?HOME MEDICATIONS: ?Outpatient Medications Prior to Visit  ?Medication Sig Dispense Refill  ? Ascorbic Acid (VITAMIN C PO) Take 1,000 mg by mouth daily.    ? aspirin EC 81 MG tablet Take 81 mg by mouth daily.    ? carbidopa-levodopa (SINEMET IR) 25-100 MG tablet Take 1 tablet by mouth 4 (four) times daily. 0800, 1100, 1500, 2000    ? Carbidopa-Levodopa ER (SINEMET CR) 25-100 MG tablet controlled release TAKE 1 TABLET BY MOUTH EVERYDAY AT BEDTIME (Patient taking differently: Take 1 tablet by mouth at bedtime.) 90 tablet 1  ? cholecalciferol (VITAMIN D) 1000 units tablet Take 2,000 Units by mouth daily.    ? Coenzyme Q10 300 MG CAPS Take 300 mg by mouth daily.    ? Cranberry 300 MG tablet Take 300 mg by mouth daily.    ? CRANBERRY-VITAMIN C-D MANNOSE PO Take 3 tablets by mouth daily.    ? escitalopram (LEXAPRO) 20 MG tablet Take 20 mg by mouth at bedtime.    ? irbesartan (AVAPRO) 75 MG tablet Take 37.5 mg by mouth at bedtime.    ? levothyroxine (SYNTHROID, LEVOTHROID) 112 MCG tablet Take 112 mcg by mouth daily before breakfast.    ? mirabegron ER (MYRBETRIQ) 50 MG TB24 tablet Take 50 mg by mouth daily.  1  ? NONFORMULARY OR COMPOUNDED ITEM Estradiol vag cream 0.02% (0.'2mg'$ /ml)  ?S:  Insert 1 ml  (1 syringe) into the vagina hs 2 times weekly as directed. (Patient taking differently: Place 1 application. vaginally every other day. Estradiol vag cream 0.02% (0.'2mg'$ /ml)  ?S:  Insert 1 ml (1 syringe) into the vagina hs 2 times weekly as directed.) 24 each 4  ? polyethylene glycol (MIRALAX / GLYCOLAX) 17 g packet Take 17 g by mouth daily. 14 each 0  ? rivastigmine (EXELON) 9.5 mg/24hr APPLY 1 PATCH ONTO THE SKIN EVERY DAY 90 patch 0  ? ?No facility-administered medications prior to visit.  ? ? ?PAST MEDICAL HISTORY: ?Past Medical  History:  ?Diagnosis Date  ? Anxiety   ? Arthritis   ? knee & back   ? Blood type, Rh negative   ? Breast cancer ([place])   ? Chronic fatigue   ? Depression   ? Diverticulosis   ? Endocervical polyp   ? GERD (gastroesophageal reflux disease)   ? Hypertension   ? Hypothyroidism   ? IBS (irritable bowel syndrome)   ? Insomnia   ? Lymphedema   ? Neuromuscular disorder ([place])   ? Parkinson's disease   ? Osteopenia 06/2015  ? T score -2.4 distal third of the radius  ? Parkinson disease ([place])   ? RBBB 10/07/2013  ? Thyroid cancer ([place])   ? ? ?PAST SURGICAL HISTORY: ?Past Surgical History:  ?Procedure Laterality Date  ? APPENDECTOMY  1979  ? BREAST SURGERY  1979  ? MASTECTOMY AND RECONSTRUCTION  ? DILATION AND CURETTAGE OF UTERUS    ? MISCARRIAGE  ? EYE SURGERY Bilateral   ? w/ IOL  ? HYSTEROSCOPY  [id number]  ? AND D&C  ? MASTECTOMY Bilateral   ? THYROIDECTOMY    ? TONSILLECTOMY    ? TOTAL KNEE ARTHROPLASTY Right 10/07/2013  ? Procedure: RIGHT TOTAL KNEE ARTHROPLASTY;  Surgeon: Kerin Salen, MD;  Location: [facility];  Service: Orthopedics;  Laterality: Right;  ? TUBAL LIGATION    ? ? ?FAMILY HISTORY: ?Family History  ?Problem Relation Age of Onset  ? Hypertension Mother   ? Hyperlipidemia Mother   ? Heart failure Mother   ? Hypertension Father   ? Hyperlipidemia Father   ? Heart failure Father   ? Hyperlipidemia Brother   ? Hypertension Brother   ? Heart attack Brother   ? Breast cancer Neg Hx   ?  Parkinson's disease Neg Hx   ? ? ?SOCIAL HISTORY: ?Social History  ? ?Socioeconomic History  ? Marital status: Married  ?  Spouse name: Not on file  ? Number of children: Not on file  ? Years of education: No

## 2021-05-26 NOTE — Patient Instructions (Signed)
Your Plan: ? ?Continue Sinemet IR and CR ?Home sleep test ?Continue Exelon patch ?Memory score is stable ? ?Thank you for coming to see [place] at [facility]. I hope we have been able to provide you high quality care today. ? ?You may receive a patient satisfaction survey over the next few weeks. We would appreciate your feedback and comments so that we may continue to improve ourselves and the health of our patients. ? ?

## 2021-06-03 ENCOUNTER — Non-Acute Institutional Stay (SKILLED_NURSING_FACILITY): Payer: Medicare Other | Admitting: Adult Health

## 2021-06-03 ENCOUNTER — Encounter: Payer: Self-pay | Admitting: Adult Health

## 2021-06-03 DIAGNOSIS — E890 Postprocedural hypothyroidism: Secondary | ICD-10-CM | POA: Diagnosis not present

## 2021-06-03 DIAGNOSIS — G20A1 Parkinson's disease without dyskinesia, without mention of fluctuations: Secondary | ICD-10-CM

## 2021-06-03 DIAGNOSIS — G20 Parkinson's disease: Secondary | ICD-10-CM | POA: Diagnosis not present

## 2021-06-03 DIAGNOSIS — R413 Other amnesia: Secondary | ICD-10-CM | POA: Diagnosis not present

## 2021-06-03 DIAGNOSIS — R269 Unspecified abnormalities of gait and mobility: Secondary | ICD-10-CM

## 2021-06-03 DIAGNOSIS — I10 Essential (primary) hypertension: Secondary | ICD-10-CM

## 2021-06-03 DIAGNOSIS — G4719 Other hypersomnia: Secondary | ICD-10-CM

## 2021-06-03 DIAGNOSIS — F3341 Major depressive disorder, recurrent, in partial remission: Secondary | ICD-10-CM

## 2021-06-03 NOTE — Progress Notes (Signed)
?Location:  [facility] ?Nursing Home Room Number: 155 A ?Place of Service:  SNF (31) ?Provider:  Royal Hawthorn, NP  ? ?Patient Care Team: ?Seward Carol, MD as PCP - General (Internal Medicine) ?Belva Crome, MD as Consulting Physician (Cardiology) ? ?Extended Emergency Contact Information ?Primary Emergency Contact: Fennema,Erwin ?Address: [address] ?         [place], [place] [address] [place] ?Home Phone: [phone] ?Work Phone: [phone] ?Mobile Phone: [phone] ?Relation: Spouse ?Secondary Emergency Contact: Rozeboom,Theodore ?         [place] , [place] ?Home Phone: [phone] ?Relation: Granddaughter ? ?Code Status:  Full Code  ? ?Goals of care: Advanced Directive information ? ?  06/03/2021  ?  9:20 AM  ?Advanced Directives  ?Does Patient Have a Medical Advance Directive? Yes  ?Does patient want to make changes to medical advance directive? No - Patient declined  ?Copy of [place] in Chart? No - copy requested  ? ? ? ?Chief Complaint  ?Patient presents with  ? Acute Visit  ?  Decreased mobility   ? ? ?HPI:  ?Pt is a 88 y.o. female seen today for an acute visit for decreased mobility. PMH significant for PD, thyroid can s/p resection with post surgical hypothyroidism, anxiety, depression, breast cancer.  She has PD which has been progressing. She was living in [place] with her husband and her weakness progressed so she was admitted to rehab. She has had several ER visits and falls. Last ER visit was 05/23/21 where she had a mechanical fall and hit her head which led to a laceration. She had sutures placed which have since been removed. CT of the head showed no acute findings, atrophy and chronic small vessel disease noted.  ?She also was in the ER for a non responsive episode 04/29/21 with no acute findings.  ?She has needed for assistance with transferring. Also noted memory loss. MMSE pending. Labs were done in March and  April with no acute findings. Staff express concern that she is not taking her sinemet with regularity. She had planned to move to skilled care but came to rehab early while waiting on her room.  ?BP 91/69, recheck 127/73.  5/10 in the 160s ?She has lost 5 lbs in the past month. I don't believe the weight of 131 in epic is right.  ?Past Medical History:  ?Diagnosis Date  ? Anxiety   ? Arthritis   ? knee & back   ? Blood type, Rh negative   ? Breast cancer ([facility])   ? Chronic fatigue   ? Depression   ? Diverticulosis   ? Endocervical polyp   ? GERD (gastroesophageal reflux disease)   ? Hypertension   ? Hypothyroidism   ? IBS (irritable bowel syndrome)   ? Insomnia   ? Lymphedema   ? Neuromuscular disorder ([facility])   ? Parkinson's disease   ? Osteopenia 06/2015  ? T score -2.4 distal third of the radius  ? Parkinson disease ([facility])   ? RBBB 10/07/2013  ? Thyroid cancer ([facility])   ? ?Past Surgical History:  ?Procedure Laterality Date  ? APPENDECTOMY  1979  ? BREAST SURGERY  1979  ? MASTECTOMY AND RECONSTRUCTION  ? DILATION AND CURETTAGE OF UTERUS    ? MISCARRIAGE  ? EYE SURGERY Bilateral   ? w/ IOL  ? HYSTEROSCOPY  2440,1027  ? AND D&C  ? MASTECTOMY Bilateral   ? THYROIDECTOMY    ?  TONSILLECTOMY    ? TOTAL KNEE ARTHROPLASTY Right 10/07/2013  ? Procedure: RIGHT TOTAL KNEE ARTHROPLASTY;  Surgeon: Kerin Salen, MD;  Location: [facility];  Service: Orthopedics;  Laterality: Right;  ? TUBAL LIGATION    ? ? ?Allergies  ?Allergen Reactions  ? Sulfa Antibiotics Rash  ? Biaxin [Clarithromycin] Other (See Comments)  ?  Pt does not remember reaction ?  ? Ciprofloxacin Nausea And Vomiting  ? Hctz [Hydrochlorothiazide] Other (See Comments)  ?  weakness and hyponatremia  ? Macrobid [Nitrofurantoin] Nausea Only  ? Other Nausea Only  ? Prednisone Other (See Comments)  ?  Swelling and facial redness ?  ? Sulfamethoxazole-Trimethoprim Other (See Comments) and Rash  ?  Sore throat  ? ? ?Outpatient Encounter Medications as of 06/03/2021  ?Medication Sig   ? acetaminophen (TYLENOL) 500 MG tablet Take 1,000 mg by mouth every 8 (eight) hours as needed.  ? Ascorbic Acid (VITAMIN C PO) Take 1,000 mg by mouth daily.  ? aspirin EC 81 MG tablet Take 81 mg by mouth daily.  ? carbidopa-levodopa (SINEMET IR) 25-100 MG tablet Take 1 tablet by mouth 4 (four) times daily. 0800, 1100, 1500, 2000  ? Carbidopa-Levodopa ER (SINEMET CR) 25-100 MG tablet controlled release Take 1 tablet by mouth at bedtime.  ? cholecalciferol (VITAMIN D) 1000 units tablet Take 2,000 Units by mouth daily.  ? CRANBERRY-VITAMIN C-D MANNOSE PO Take 3 tablets by mouth daily.  ? escitalopram (LEXAPRO) 20 MG tablet Take 20 mg by mouth at bedtime.  ? irbesartan (AVAPRO) 75 MG tablet Take 75 mg by mouth at bedtime.  ? levothyroxine (SYNTHROID, LEVOTHROID) 112 MCG tablet Take 112 mcg by mouth daily before breakfast.  ? mirabegron ER (MYRBETRIQ) 50 MG TB24 tablet Take 50 mg by mouth daily.  ? Multiple Vitamin (MULTIVITAMIN) tablet Take 1 tablet by mouth daily.  ? NONFORMULARY OR COMPOUNDED ITEM Estradiol vag cream 0.02% (0.'2mg'$ /ml)  ?S:  Insert 1 ml (1 syringe) into the vagina hs 2 times weekly as directed.  ? polyethylene glycol (MIRALAX / GLYCOLAX) 17 g packet Take 17 g by mouth daily.  ? rivastigmine (EXELON) 9.5 mg/24hr APPLY 1 PATCH ONTO THE SKIN EVERY DAY  ? [DISCONTINUED] Coenzyme Q10 300 MG CAPS Take 300 mg by mouth daily.  ? [DISCONTINUED] Cranberry 300 MG tablet Take 300 mg by mouth daily. (Patient not taking: Reported on 06/03/2021)  ? [DISCONTINUED] estradiol (ESTRACE) 0.1 MG/GM vaginal cream Place 1 Applicatorful vaginally at bedtime. 1 mL on Sunday and Thursday (Patient not taking: Reported on 06/03/2021)  ? ?No facility-administered encounter medications on file as of 06/03/2021.  ? ? ?Review of Systems  ?Constitutional:  Positive for activity change, appetite change and unexpected weight change. Negative for chills, diaphoresis, fatigue and fever.  ?HENT:  Positive for voice change. Negative for  congestion and trouble swallowing.   ?Respiratory:  Negative for cough, shortness of breath and wheezing.   ?Cardiovascular:  Negative for chest pain, palpitations and leg swelling.  ?Gastrointestinal:  Negative for abdominal distention, abdominal pain, constipation and diarrhea.  ?Genitourinary:  Negative for difficulty urinating and dysuria.  ?Musculoskeletal:  Positive for gait problem. Negative for arthralgias, back pain, joint swelling and myalgias.  ?Neurological:  Positive for tremors, speech difficulty and weakness. Negative for dizziness, seizures, syncope, facial asymmetry, light-headedness, numbness and headaches.  ?Psychiatric/Behavioral:  Positive for confusion. Negative for agitation and behavioral problems.   ? ?Immunization History  ?Administered Date(s) Administered  ? DTP 01/24/1994  ? Influenza Split 10/17/2008, 10/23/2009, 11/18/2011,  11/21/2012, 12/10/2019  ? Influenza Whole 10/09/2007  ? Influenza,inj,Quad PF,6+ Mos 10/26/2010  ? Influenza-Unspecified 11/05/2013, 10/31/2014, 10/12/2015, 10/20/2016, 11/15/2017, 11/13/2020  ? Moderna SARS-COV2 Booster Vaccination 11/28/2019, 05/28/2020, 11/06/2020  ? Moderna Sars-Covid-2 Vaccination 01/29/2019, 02/28/2019  ? Pneumococcal Conjugate-13 10/23/2014  ? Pneumococcal Polysaccharide-23 10/25/2002  ? Tdap 05/23/2021  ? Zoster, Live 10/13/2005  ? Zoster, Unspecified 10/13/2005  ? ?Pertinent  Health Maintenance Due  ?Topic Date Due  ? PAP SMEAR-Modifier  03/15/2013  ? INFLUENZA VACCINE  08/24/2021  ? DEXA SCAN  Completed  ? ? ?  04/19/2021  ? 10:11 AM 04/19/2021  ? 11:00 PM 04/20/2021  ? 12:00 PM 04/29/2021  ? 12:25 PM 05/23/2021  ? 12:01 PM  ?Fall Risk  ?Patient Fall Risk Level High fall risk High fall risk High fall risk Low fall risk Moderate fall risk  ? ?Functional Status Survey: ?  ? ?Vitals:  ? 06/03/21 0844  ?BP: 91/69  ?Pulse: 75  ?Resp: 12  ?Temp: 97.9 ?F (36.6 ?C)  ?SpO2: 96%  ?Height: '5\' 3"'$  (1.6 m)  ? ?Body mass index is 23.21 kg/m?. ?Wt Readings from  Last 3 Encounters:  ?06/03/21 114 lb (51.7 kg)  ?05/26/21 131 lb (59.4 kg)  ?05/23/21 119 lb 11.4 oz (54.3 kg)  ? ? ?Physical Exam ?Vitals and nursing note reviewed.  ?Constitutional:   ?   General: She is

## 2021-06-07 ENCOUNTER — Non-Acute Institutional Stay (SKILLED_NURSING_FACILITY): Payer: Medicare Other | Admitting: Internal Medicine

## 2021-06-07 ENCOUNTER — Encounter: Payer: Self-pay | Admitting: Internal Medicine

## 2021-06-07 DIAGNOSIS — G20 Parkinson's disease: Secondary | ICD-10-CM

## 2021-06-07 DIAGNOSIS — G4719 Other hypersomnia: Secondary | ICD-10-CM | POA: Diagnosis not present

## 2021-06-07 DIAGNOSIS — R4189 Other symptoms and signs involving cognitive functions and awareness: Secondary | ICD-10-CM

## 2021-06-07 DIAGNOSIS — R296 Repeated falls: Secondary | ICD-10-CM

## 2021-06-07 DIAGNOSIS — F3341 Major depressive disorder, recurrent, in partial remission: Secondary | ICD-10-CM

## 2021-06-07 DIAGNOSIS — E890 Postprocedural hypothyroidism: Secondary | ICD-10-CM | POA: Diagnosis not present

## 2021-06-07 DIAGNOSIS — N3946 Mixed incontinence: Secondary | ICD-10-CM

## 2021-06-07 DIAGNOSIS — I10 Essential (primary) hypertension: Secondary | ICD-10-CM

## 2021-06-07 NOTE — Progress Notes (Signed)
?Provider:  Veleta Miners MD ?Location:    [facility] ?Nursing Home Room Number: 155 ?Place of Service:  SNF (31) ? ?PCP: Seward Carol, MD ?Patient Care Team: ?Seward Carol, MD as PCP - General (Internal Medicine) ?Belva Crome, MD as Consulting Physician (Cardiology) ? ?Extended Emergency Contact Information ?Primary Emergency Contact: Ragain,Erwin ?Address: [address] ?         [place], [place] [address] [place] ?Home Phone: [phone] ?Work Phone: [phone] ?Mobile Phone: [phone] ?Relation: Spouse ?Secondary Emergency Contact: Musolino,Theodore ?         [place] , [place] ?Home Phone: [phone] ?Relation: Granddaughter ? ?Code Status: Full Code ?Goals of Care: Advanced Directive information ? ?  06/07/2021  ? 10:03 AM  ?Advanced Directives  ?Does Patient Have a Medical Advance Directive? Yes  ?Does patient want to make changes to medical advance directive? No - Patient declined  ?Copy of [place] in Chart? No - copy requested  ? ? ? ? ?Chief Complaint  ?Patient presents with  ? New Admit To SNF  ?  Admission to SNF  ? ? ?HPI: Patient is a 88 y.o. female seen today for admission to SNF  ? ?Patient sees Dr. Rexene Alberts for her Parkinson.  Has seen [name] before.  Has seen Dr. Mervyn Skeeters at [facility] ?Patient has had extensive work-up ? h/o cognitive impairment  ?Hypertension ?Hypothyroidism ?Depression ?Urinary In continence ?Osteoporosis ? ?Recently patient has had number of visits to the hospital.  She was also admitted from 3/27 to 3/28 for change in mental status.  Ruled out for infection.MRI brain did not show any acute abnormality, showed moderate chronic small vessel ischemic disease and brain atrophy.  ?Also was sent to ED on 04/06 for altered mental status.  And was recommended to get a sleep study ?Fell On 04/30 ? ?Finally the husband has decided that he cannot take care of her and moving her to rehab and is skilled  facility.  Patient has had few more falls in her apartment. ?Patient was at her baseline today.  Denies any pain. Only was upset about moving to skilled care. ? ? ? ?Past Medical History:  ?Diagnosis Date  ? Anxiety   ? Arthritis   ? knee & back   ? Blood type, Rh negative   ? Breast cancer ([facility])   ? Chronic fatigue   ? Depression   ? Diverticulosis   ? Endocervical polyp   ? GERD (gastroesophageal reflux disease)   ? Hypertension   ? Hypothyroidism   ? IBS (irritable bowel syndrome)   ? Insomnia   ? Lymphedema   ? Neuromuscular disorder ([facility])   ? Parkinson's disease   ? Osteopenia 06/2015  ? T score -2.4 distal third of the radius  ? Parkinson disease ([facility])   ? RBBB 10/07/2013  ? Thyroid cancer ([facility])   ? ?Past Surgical History:  ?Procedure Laterality Date  ? APPENDECTOMY  1979  ? BREAST SURGERY  1979  ? MASTECTOMY AND RECONSTRUCTION  ? DILATION AND CURETTAGE OF UTERUS    ? MISCARRIAGE  ? EYE SURGERY Bilateral   ? w/ IOL  ? HYSTEROSCOPY  [id number]  ? AND D&C  ? MASTECTOMY Bilateral   ? THYROIDECTOMY    ? TONSILLECTOMY    ? TOTAL KNEE ARTHROPLASTY Right 10/07/2013  ? Procedure: RIGHT TOTAL KNEE ARTHROPLASTY;  Surgeon: Kerin Salen, MD;  Location: [facility];  Service: Orthopedics;  Laterality: Right;  ?  TUBAL LIGATION    ? ? reports that she has never smoked. She has never used smokeless tobacco. She reports current alcohol use. She reports that she does not use drugs. ?Social History  ? ?Socioeconomic History  ? Marital status: Married  ?  Spouse name: Not on file  ? Number of children: Not on file  ? Years of education: Not on file  ? Highest education level: Not on file  ?Occupational History  ? Occupation: retired  ?  Comment: "odd jobs"  ?Tobacco Use  ? Smoking status: Never  ? Smokeless tobacco: Never  ?Vaping Use  ? Vaping Use: Never used  ?Substance and Sexual Activity  ? Alcohol use: Yes  ?  Alcohol/week: 0.0 standard drinks  ?  Comment: rare  ? Drug use: No  ? Sexual activity: Never  ?  Birth control/protection:  Surgical, Post-menopausal  ?  Comment: 1st intercourse 88 yo-Fewer than 5 partners  ?Other Topics Concern  ? Not on file  ?Social History Narrative  ? Not on file  ? ?Social Determinants of Health  ? ?Financial Resource Strain: Not on file  ?Food Insecurity: Not on file  ?Transportation Needs: Not on file  ?Physical Activity: Not on file  ?Stress: Not on file  ?Social Connections: Not on file  ?Intimate Partner Violence: Not on file  ? ? ?Functional Status Survey: ?  ? ?Family History  ?Problem Relation Age of Onset  ? Hypertension Mother   ? Hyperlipidemia Mother   ? Heart failure Mother   ? Hypertension Father   ? Hyperlipidemia Father   ? Heart failure Father   ? Hyperlipidemia Brother   ? Hypertension Brother   ? Heart attack Brother   ? Breast cancer Neg Hx   ? Parkinson's disease Neg Hx   ? ? ?Health Maintenance  ?Topic Date Due  ? Zoster Vaccines- Shingrix (1 of 2) 03/09/1948  ? PAP SMEAR-Modifier  03/15/2013  ? COVID-19 Vaccine (3 - Moderna risk series) 12/15/2021 (Originally 12/04/2020)  ? INFLUENZA VACCINE  08/24/2021  ? TETANUS/TDAP  05/24/2031  ? Pneumonia Vaccine 25+ Years old  Completed  ? DEXA SCAN  Completed  ? HPV VACCINES  Aged Out  ? ? ?Allergies  ?Allergen Reactions  ? Sulfa Antibiotics Rash  ? Biaxin [Clarithromycin] Other (See Comments)  ?  Pt does not remember reaction ?  ? Ciprofloxacin Nausea And Vomiting  ? Hctz [Hydrochlorothiazide] Other (See Comments)  ?  weakness and hyponatremia  ? Macrobid [Nitrofurantoin] Nausea Only  ? Other Nausea Only  ? Prednisone Other (See Comments)  ?  Swelling and facial redness ?  ? Sulfamethoxazole-Trimethoprim Other (See Comments) and Rash  ?  Sore throat  ? ? ?Allergies as of 06/07/2021   ? ?   Reactions  ? Sulfa Antibiotics Rash  ? Biaxin [clarithromycin] Other (See Comments)  ? Pt does not remember reaction  ? Ciprofloxacin Nausea And Vomiting  ? Hctz [hydrochlorothiazide] Other (See Comments)  ? weakness and hyponatremia  ? Macrobid [nitrofurantoin]  Nausea Only  ? Other Nausea Only  ? Prednisone Other (See Comments)  ? Swelling and facial redness  ? Sulfamethoxazole-trimethoprim Other (See Comments), Rash  ? Sore throat  ? ?  ? ?  ?Medication List  ?  ? ?  ? Accurate as of Jun 07, 2021 10:04 AM. If you have any questions, ask your nurse or doctor.  ?  ?  ? ?  ? ?acetaminophen 500 MG tablet ?Commonly known as: TYLENOL ?Take  1,000 mg by mouth every 8 (eight) hours as needed. ?  ?aspirin EC 81 MG tablet ?Take 81 mg by mouth daily. ?  ?carbidopa-levodopa 25-100 MG tablet ?Commonly known as: SINEMET IR ?Take 1 tablet by mouth 4 (four) times daily. 0800, 1100, 1500, 2000 ?  ?Carbidopa-Levodopa ER 25-100 MG tablet controlled release ?Commonly known as: SINEMET CR ?Take 1 tablet by mouth at bedtime. ?  ?cholecalciferol 1000 units tablet ?Commonly known as: VITAMIN D ?Take 2,000 Units by mouth daily. ?  ?CRANBERRY-VITAMIN C-D MANNOSE PO ?Take 3 tablets by mouth daily. ?  ?escitalopram 20 MG tablet ?Commonly known as: LEXAPRO ?Take 20 mg by mouth at bedtime. ?  ?irbesartan 75 MG tablet ?Commonly known as: AVAPRO ?Take 37.5 mg by mouth every morning. ?  ?levothyroxine 112 MCG tablet ?Commonly known as: SYNTHROID ?Take 112 mcg by mouth daily before breakfast. ?  ?mirabegron ER 50 MG Tb24 tablet ?Commonly known as: MYRBETRIQ ?Take 50 mg by mouth daily. ?  ?multivitamin tablet ?Take 1 tablet by mouth daily. ?  ?NONFORMULARY OR COMPOUNDED ITEM ?Estradiol vag cream 0.02% (0.'2mg'$ /ml)  ?S:  Insert 1 ml (1 syringe) into the vagina hs 2 times weekly as directed. ?  ?polyethylene glycol 17 g packet ?Commonly known as: MIRALAX / GLYCOLAX ?Take 17 g by mouth daily. ?  ?rivastigmine 9.5 mg/24hr ?Commonly known as: EXELON ?APPLY 1 PATCH ONTO THE SKIN EVERY DAY ?  ?VITAMIN C PO ?Take 1,000 mg by mouth daily. ?  ? ?  ? ? ?Review of Systems  ?Constitutional:  Positive for activity change. Negative for appetite change.  ?HENT: Negative.    ?Respiratory:  Negative for cough and shortness of  breath.   ?Cardiovascular:  Negative for leg swelling.  ?Gastrointestinal:  Negative for constipation.  ?Genitourinary: Negative.   ?Musculoskeletal:  Positive for gait problem. Negative for arthralgias an

## 2021-06-15 ENCOUNTER — Encounter: Payer: Self-pay | Admitting: Orthopedic Surgery

## 2021-06-15 ENCOUNTER — Non-Acute Institutional Stay (SKILLED_NURSING_FACILITY): Payer: Medicare Other | Admitting: Orthopedic Surgery

## 2021-06-15 DIAGNOSIS — N3281 Overactive bladder: Secondary | ICD-10-CM | POA: Diagnosis not present

## 2021-06-15 DIAGNOSIS — G20 Parkinson's disease: Secondary | ICD-10-CM

## 2021-06-15 DIAGNOSIS — F3341 Major depressive disorder, recurrent, in partial remission: Secondary | ICD-10-CM | POA: Diagnosis not present

## 2021-06-15 NOTE — Progress Notes (Deleted)
Location:  [facility] Room Number: 143/A Place of Service:  SNF (31) Provider: Yvonna Alanis, NP  Patient Care Team: Seward Carol, MD as PCP - General (Internal Medicine) Belva Crome, MD as Consulting Physician (Cardiology)  Extended Emergency Contact Information Primary Emergency Contact: [name] Address: [address]          [place], [place] [address] [name] of [place] Phone: [phone] Work Phone: [phone] Mobile Phone: [phone] Relation: Spouse Secondary Emergency Contact: Hanley Ben , [place] of [place] Phone: [phone] Relation: Granddaughter  Code Status:  Full code Goals of care: Advanced Directive information    06/15/2021   10:39 AM  Advanced Directives  Does Patient Have a Medical Advance Directive? Yes  Does patient want to make changes to medical advance directive? No - Patient declined  Copy of [place] in Chart? No - copy requested     Chief Complaint  Patient presents with   Acute Visit    Overactive bladder    HPI:  Pt is a 88 y.o. female seen today for an acute visit for    Past Medical History:  Diagnosis Date   Anxiety    Arthritis    knee & back    Blood type, Rh negative    Breast cancer (HCC)    Chronic fatigue    Depression    Diverticulosis    Endocervical polyp    GERD (gastroesophageal reflux disease)    Hypertension    Hypothyroidism    IBS (irritable bowel syndrome)    Insomnia    Lymphedema    Neuromuscular disorder ([facility])    Parkinson's disease    Osteopenia 06/2015   T score -2.4 distal third of the radius   Parkinson disease ([facility])    RBBB 10/07/2013   Thyroid cancer ([facility])    Past Surgical History:  Procedure Laterality Date   APPENDECTOMY  1979   BREAST SURGERY  1979   MASTECTOMY AND RECONSTRUCTION   DILATION AND CURETTAGE OF UTERUS     MISCARRIAGE   EYE SURGERY Bilateral    w/ IOL    HYSTEROSCOPY  2005,2009   AND D&C   MASTECTOMY Bilateral    THYROIDECTOMY     TONSILLECTOMY     TOTAL KNEE ARTHROPLASTY Right 10/07/2013   Procedure: RIGHT TOTAL KNEE ARTHROPLASTY;  Surgeon: Kerin Salen, MD;  Location: [facility];  Service: Orthopedics;  Laterality: Right;   TUBAL LIGATION      Allergies  Allergen Reactions   Sulfa Antibiotics Rash   Biaxin [Clarithromycin] Other (See Comments)    Pt does not remember reaction    Ciprofloxacin Nausea And Vomiting   Hctz [Hydrochlorothiazide] Other (See Comments)    weakness and hyponatremia   Macrobid [Nitrofurantoin] Nausea Only   Other Nausea Only   Prednisone Other (See Comments)    Swelling and facial redness    Sulfamethoxazole-Trimethoprim Other (See Comments) and Rash    Sore throat    Outpatient Encounter Medications as of 06/15/2021  Medication Sig   acetaminophen (TYLENOL) 500 MG tablet Take 1,000 mg by mouth every 8 (eight) hours as needed.   Ascorbic Acid (VITAMIN C PO) Take 1,000 mg by mouth daily.   aspirin EC 81 MG tablet Take 81 mg by mouth daily.   carbidopa-levodopa (SINEMET IR) 25-100 MG tablet Take 1 tablet by mouth 4 (four) times daily. 0800, 1100, 1500, 2000  Carbidopa-Levodopa ER (SINEMET CR) 25-100 MG tablet controlled release Take 1 tablet by mouth at bedtime.   cholecalciferol (VITAMIN D) 1000 units tablet Take 2,000 Units by mouth daily.   CRANBERRY-VITAMIN C-D MANNOSE PO Take 3 tablets by mouth daily.   escitalopram (LEXAPRO) 20 MG tablet Take 20 mg by mouth at bedtime.   irbesartan (AVAPRO) 75 MG tablet Take 37.5 mg by mouth every morning.   levothyroxine (SYNTHROID, LEVOTHROID) 112 MCG tablet Take 112 mcg by mouth daily before breakfast.   mirabegron ER (MYRBETRIQ) 50 MG TB24 tablet Take 50 mg by mouth daily.   Multiple Vitamin (MULTIVITAMIN) tablet Take 1 tablet by mouth daily.   NONFORMULARY OR COMPOUNDED ITEM Estradiol vag cream 0.02% (0.'2mg'$ /ml)  S:  Insert 1 ml (1 syringe) into the vagina hs  2 times weekly as directed.   polyethylene glycol (MIRALAX / GLYCOLAX) 17 g packet Take 17 g by mouth daily.   rivastigmine (EXELON) 9.5 mg/24hr APPLY 1 PATCH ONTO THE SKIN EVERY DAY   No facility-administered encounter medications on file as of 06/15/2021.    Review of Systems  Immunization History  Administered Date(s) Administered   DTP 01/24/1994   Influenza Split 10/17/2008, 10/23/2009, 11/18/2011, 11/21/2012, 12/10/2019   Influenza Whole 10/09/2007   Influenza,inj,Quad PF,6+ Mos 10/26/2010   Influenza-Unspecified 11/05/2013, 10/31/2014, 10/12/2015, 10/20/2016, 11/15/2017, 11/13/2020   Moderna SARS-COV2 Booster Vaccination 11/28/2019, 05/28/2020, 11/06/2020   Moderna Sars-Covid-2 Vaccination 01/29/2019, 02/28/2019   Pneumococcal Conjugate-13 10/23/2014   Pneumococcal Polysaccharide-23 10/25/2002   Tdap 05/23/2021   Zoster, Live 10/13/2005   Zoster, Unspecified 10/13/2005   Pertinent  Health Maintenance Due  Topic Date Due   PAP SMEAR-Modifier  03/15/2013   INFLUENZA VACCINE  08/24/2021   DEXA SCAN  Completed      04/19/2021   10:11 AM 04/19/2021   11:00 PM 04/20/2021   12:00 PM 04/29/2021   12:25 PM 05/23/2021   12:01 PM  Fall Risk  Patient Fall Risk Level High fall risk High fall risk High fall risk Low fall risk Moderate fall risk   Functional Status Survey:    Vitals:   06/15/21 1032  BP: 134/76  Pulse: 74  Resp: 18  Temp: 97.6 F (36.4 C)  SpO2: 98%  Weight: 117 lb 9.6 oz (53.3 kg)  Height: '5\' 3"'$  (1.6 m)   Body mass index is 20.83 kg/m. Physical Exam  Labs reviewed: Recent Labs    04/19/21 1010 04/20/21 0307 04/29/21 1343  NA 138 138 138  K 3.5 3.2* 3.9  CL 106 108 107  CO2 '25 25 26  '$ GLUCOSE 95 98 94  BUN 26* 24* 26*  CREATININE 0.88 0.78 0.87  CALCIUM 8.6* 8.5* 8.4*   Recent Labs    04/19/21 1010 04/29/21 1343  AST 15 18  ALT 9 17  ALKPHOS 59 59  BILITOT 1.2 0.6  PROT 6.1* 5.9*  ALBUMIN 3.4* 3.3*   Recent Labs    04/19/21 1010  04/20/21 0307 04/29/21 1343  WBC 5.4 6.4 6.0  NEUTROABS  --  4.5 4.1  HGB 12.7 12.3 12.3  HCT 38.7 36.3 37.8  MCV 96.5 94.0 96.2  PLT 201 202 194   Lab Results  Component Value Date   TSH 1.635 04/20/2021   Lab Results  Component Value Date   HGBA1C 5.6 10/15/2013   No results found for: CHOL, HDL, LDLCALC, LDLDIRECT, TRIG, CHOLHDL  Significant Diagnostic Results in last 30 days:  CT Head Wo Contrast  Result Date: 05/23/2021 CLINICAL DATA:  Patient  lost her balance and fell backwards striking posterior head. Large scalp hematoma. EXAM: CT HEAD WITHOUT CONTRAST CT CERVICAL SPINE WITHOUT CONTRAST TECHNIQUE: Multidetector CT imaging of the head and cervical spine was performed following the standard protocol without intravenous contrast. Multiplanar CT image reconstructions of the cervical spine were also generated. RADIATION DOSE REDUCTION: This exam was performed according to the departmental dose-optimization program which includes automated exposure control, adjustment of the mA and/or kV according to patient size and/or use of iterative reconstruction technique. COMPARISON:  04/19/2021 head CT. FINDINGS: CT HEAD FINDINGS Brain: There is no evidence for acute hemorrhage, hydrocephalus, mass lesion, or abnormal extra-axial fluid collection. No definite CT evidence for acute infarction. Diffuse loss of parenchymal volume is consistent with atrophy. Patchy low attenuation in the deep hemispheric and periventricular white matter is nonspecific, but likely reflects chronic microvascular ischemic demyelination. Vascular: No hyperdense vessel or unexpected calcification. Skull: No evidence for fracture. No worrisome lytic or sclerotic lesion. Sinuses/Orbits: The visualized paranasal sinuses and mastoid air cells are clear. Visualized portions of the globes and intraorbital fat are unremarkable. Other: None. CT CERVICAL SPINE FINDINGS Alignment: Normal. Skull base and vertebrae: No acute fracture. No  primary bone lesion or focal pathologic process. Soft tissues and spinal canal: No prevertebral fluid or swelling. No visible canal hematoma. Disc levels: Loss of disc height noted C5-6 and C6-7. Facets are well aligned bilaterally. Upper chest: Unremarkable. Other: None. IMPRESSION: 1. No acute intracranial abnormality. 2. Atrophy with chronic small vessel ischemic disease. 3. Degenerative changes in the cervical spine without acute fracture or traumatic subluxation. Electronically Signed   By: Misty Stanley M.D.   On: 05/23/2021 13:46   CT Cervical Spine Wo Contrast  Result Date: 05/23/2021 CLINICAL DATA:  Patient lost her balance and fell backwards striking posterior head. Large scalp hematoma. EXAM: CT HEAD WITHOUT CONTRAST CT CERVICAL SPINE WITHOUT CONTRAST TECHNIQUE: Multidetector CT imaging of the head and cervical spine was performed following the standard protocol without intravenous contrast. Multiplanar CT image reconstructions of the cervical spine were also generated. RADIATION DOSE REDUCTION: This exam was performed according to the departmental dose-optimization program which includes automated exposure control, adjustment of the mA and/or kV according to patient size and/or use of iterative reconstruction technique. COMPARISON:  04/19/2021 head CT. FINDINGS: CT HEAD FINDINGS Brain: There is no evidence for acute hemorrhage, hydrocephalus, mass lesion, or abnormal extra-axial fluid collection. No definite CT evidence for acute infarction. Diffuse loss of parenchymal volume is consistent with atrophy. Patchy low attenuation in the deep hemispheric and periventricular white matter is nonspecific, but likely reflects chronic microvascular ischemic demyelination. Vascular: No hyperdense vessel or unexpected calcification. Skull: No evidence for fracture. No worrisome lytic or sclerotic lesion. Sinuses/Orbits: The visualized paranasal sinuses and mastoid air cells are clear. Visualized portions of the  globes and intraorbital fat are unremarkable. Other: None. CT CERVICAL SPINE FINDINGS Alignment: Normal. Skull base and vertebrae: No acute fracture. No primary bone lesion or focal pathologic process. Soft tissues and spinal canal: No prevertebral fluid or swelling. No visible canal hematoma. Disc levels: Loss of disc height noted C5-6 and C6-7. Facets are well aligned bilaterally. Upper chest: Unremarkable. Other: None. IMPRESSION: 1. No acute intracranial abnormality. 2. Atrophy with chronic small vessel ischemic disease. 3. Degenerative changes in the cervical spine without acute fracture or traumatic subluxation. Electronically Signed   By: Misty Stanley M.D.   On: 05/23/2021 13:46    Assessment/Plan There are no diagnoses linked to this encounter.   Family/  staff Communication: ***  Labs/tests ordered:  ***

## 2021-06-15 NOTE — Progress Notes (Signed)
Location:  [facility] Room Number: 143/A Place of Service:  SNF [phone]) Provider: Yvonna Alanis, NP   Patient Care Team: Virgie Dad, MD as PCP - General (Internal Medicine) Belva Crome, MD as Consulting Physician (Cardiology)  Extended Emergency Contact Information Primary Emergency Contact: [name] Address: [address]          [place], [place] [address] [name] of [place] Phone: [phone] Work Phone: [phone] Mobile Phone: [phone] Relation: Spouse Secondary Emergency Contact: Hanley Ben , [place] Phone: [phone] Relation: Granddaughter  Code Status:  Full code Goals of care: Advanced Directive information    06/15/2021   10:39 AM  Advanced Directives  Does Patient Have a Medical Advance Directive? Yes  Does patient want to make changes to medical advance directive? No - Patient declined  Copy of [place] in Chart? No - copy requested     Chief Complaint  Patient presents with   Acute Visit    Overactive bladder    HPI:  Pt is a 88 y.o. female seen today for an acute visit for OAB.   She recently moved to skilled nursing at [facility] due to recurrent falls and Parkinson's disease. PMH: HTN, RBBB, hypothyroidism, thyroid cancer, acute encephalopathy, DDD, chronic back pain, abnormal gait, and depression.   Husband present during encounter. He is requesting medication review of medications for OAB. She is followed by urologist, Dr. Zigmund Daniel, due to OAB, nocturia and vaginal atrophy. She is currently taking D-mannos supplement with cranberry 650/60 mg TID, cranberry supplement 450 mg QOD, and Myrbetriq 50 mg daily. She is unsure if Myrbetriq is helpful, it is also very expensive. Treatment options discussed. At this time they would like to discontinue cranberry supplement 450 mg QOD and hold Myrbetriq x 2 weeks.   Adjusting to  SNF. Ambulates with wheelchair. No recent falls. Husband very supportive and visits daily. No mood changes, remains on Lexapro.   Past Medical History:  Diagnosis Date   Anxiety    Arthritis    knee & back    Blood type, Rh negative    Breast cancer (HCC)    Chronic fatigue    Depression    Diverticulosis    Endocervical polyp    GERD (gastroesophageal reflux disease)    Hypertension    Hypothyroidism    IBS (irritable bowel syndrome)    Insomnia    Lymphedema    Neuromuscular disorder ([place])    Parkinson's disease    Osteopenia 06/2015   T score -2.4 distal third of the radius   Parkinson disease ([place])    RBBB 10/07/2013   Thyroid cancer ([place])    Past Surgical History:  Procedure Laterality Date   APPENDECTOMY  1979   BREAST SURGERY  1979   MASTECTOMY AND RECONSTRUCTION   DILATION AND CURETTAGE OF UTERUS     MISCARRIAGE   EYE SURGERY Bilateral    w/ IOL   HYSTEROSCOPY  2005,2009   AND D&C   MASTECTOMY Bilateral    THYROIDECTOMY     TONSILLECTOMY     TOTAL KNEE ARTHROPLASTY Right 10/07/2013   Procedure: RIGHT TOTAL KNEE ARTHROPLASTY;  Surgeon: Kerin Salen, MD;  Location: [facility];  Service: Orthopedics;  Laterality: Right;   TUBAL LIGATION      Allergies  Allergen Reactions   Sulfa Antibiotics Rash   Biaxin [Clarithromycin] Other (See Comments)  Pt does not remember reaction    Ciprofloxacin Nausea And Vomiting   Hctz [Hydrochlorothiazide] Other (See Comments)    weakness and hyponatremia   Macrobid [Nitrofurantoin] Nausea Only   Other Nausea Only   Prednisone Other (See Comments)    Swelling and facial redness    Sulfamethoxazole-Trimethoprim Other (See Comments) and Rash    Sore throat    Outpatient Encounter Medications as of 06/15/2021  Medication Sig   acetaminophen (TYLENOL) 500 MG tablet Take 1,000 mg by mouth every 8 (eight) hours as needed.   Ascorbic Acid (VITAMIN C PO) Take 1,000 mg by mouth daily.   aspirin EC 81 MG tablet Take 81 mg by  mouth daily.   carbidopa-levodopa (SINEMET IR) 25-100 MG tablet Take 1 tablet by mouth 4 (four) times daily. 0800, 1100, 1500, 2000   Carbidopa-Levodopa ER (SINEMET CR) 25-100 MG tablet controlled release Take 1 tablet by mouth at bedtime.   cholecalciferol (VITAMIN D) 1000 units tablet Take 2,000 Units by mouth daily.   CRANBERRY-VITAMIN C-D MANNOSE PO Take 3 tablets by mouth daily.   escitalopram (LEXAPRO) 20 MG tablet Take 20 mg by mouth at bedtime.   irbesartan (AVAPRO) 75 MG tablet Take 37.5 mg by mouth every morning.   levothyroxine (SYNTHROID, LEVOTHROID) 112 MCG tablet Take 112 mcg by mouth daily before breakfast.   mirabegron ER (MYRBETRIQ) 50 MG TB24 tablet Take 50 mg by mouth daily.   Multiple Vitamin (MULTIVITAMIN) tablet Take 1 tablet by mouth daily.   NONFORMULARY OR COMPOUNDED ITEM Estradiol vag cream 0.02% (0.'2mg'$ /ml)  S:  Insert 1 ml (1 syringe) into the vagina hs 2 times weekly as directed.   polyethylene glycol (MIRALAX / GLYCOLAX) 17 g packet Take 17 g by mouth daily.   rivastigmine (EXELON) 9.5 mg/24hr APPLY 1 PATCH ONTO THE SKIN EVERY DAY   No facility-administered encounter medications on file as of 06/15/2021.    Review of Systems  Constitutional:  Negative for activity change, appetite change, fatigue and fever.  HENT:  Negative for congestion and trouble swallowing.   Eyes:  Negative for visual disturbance.  Respiratory:  Negative for cough, shortness of breath and wheezing.   Cardiovascular:  Negative for chest pain and leg swelling.  Gastrointestinal:  Negative for abdominal distention, abdominal pain, constipation, diarrhea, nausea and vomiting.  Genitourinary:  Positive for frequency and urgency. Negative for dysuria, flank pain and hematuria.       Nocturia  Musculoskeletal:  Positive for arthralgias, back pain and gait problem.  Skin:  Negative for wound.  Neurological:  Positive for weakness. Negative for dizziness and headaches.  Psychiatric/Behavioral:   Positive for dysphoric mood. Negative for confusion. The patient is not nervous/anxious.    Immunization History  Administered Date(s) Administered   DTP 01/24/1994   Influenza Split 10/17/2008, 10/23/2009, 11/18/2011, 11/21/2012, 12/10/2019   Influenza Whole 10/09/2007   Influenza,inj,Quad PF,6+ Mos 10/26/2010   Influenza-Unspecified 11/05/2013, 10/31/2014, 10/12/2015, 10/20/2016, 11/15/2017, 11/13/2020   Moderna SARS-COV2 Booster Vaccination 11/28/2019, 05/28/2020, 11/06/2020   Moderna Sars-Covid-2 Vaccination 01/29/2019, 02/28/2019   Pneumococcal Conjugate-13 10/23/2014   Pneumococcal Polysaccharide-23 10/25/2002   Tdap 05/23/2021   Zoster, Live 10/13/2005   Zoster, Unspecified 10/13/2005   Pertinent  Health Maintenance Due  Topic Date Due   PAP SMEAR-Modifier  03/15/2013   INFLUENZA VACCINE  08/24/2021   DEXA SCAN  Completed      04/19/2021   10:11 AM 04/19/2021   11:00 PM 04/20/2021   12:00 PM 04/29/2021   12:25 PM 05/23/2021  12:01 PM  Fall Risk  Patient Fall Risk Level High fall risk High fall risk High fall risk Low fall risk Moderate fall risk   Functional Status Survey:    Vitals:   06/15/21 1032  BP: 134/76  Pulse: 74  Resp: 18  Temp: 97.6 F (36.4 C)  SpO2: 98%  Weight: 117 lb 9.6 oz (53.3 kg)  Height: '5\' 3"'$  (1.6 m)   Body mass index is 20.83 kg/m. Physical Exam Vitals reviewed.  Constitutional:      General: She is not in acute distress. HENT:     Head: Normocephalic.  Eyes:     General:        Right eye: No discharge.        Left eye: No discharge.  Cardiovascular:     Rate and Rhythm: Normal rate and regular rhythm.     Pulses: Normal pulses.     Heart sounds: Normal heart sounds.  Pulmonary:     Effort: Pulmonary effort is normal. No respiratory distress.     Breath sounds: Normal breath sounds. No wheezing.  Abdominal:     General: Bowel sounds are normal. There is no distension.     Palpations: Abdomen is soft.     Tenderness: There is  no abdominal tenderness.  Musculoskeletal:     Cervical back: Neck supple.     Right lower leg: No edema.     Left lower leg: No edema.  Skin:    General: Skin is warm and dry.     Capillary Refill: Capillary refill takes less than 2 seconds.  Neurological:     General: No focal deficit present.     Mental Status: She is alert and oriented to person, place, and time.     Motor: Weakness present.     Gait: Gait abnormal.  Psychiatric:        Mood and Affect: Mood normal.        Behavior: Behavior normal.    Labs reviewed: Recent Labs    04/19/21 1010 04/20/21 0307 04/29/21 1343  NA 138 138 138  K 3.5 3.2* 3.9  CL 106 108 107  CO2 '25 25 26  '$ GLUCOSE 95 98 94  BUN 26* 24* 26*  CREATININE 0.88 0.78 0.87  CALCIUM 8.6* 8.5* 8.4*   Recent Labs    04/19/21 1010 04/29/21 1343  AST 15 18  ALT 9 17  ALKPHOS 59 59  BILITOT 1.2 0.6  PROT 6.1* 5.9*  ALBUMIN 3.4* 3.3*   Recent Labs    04/19/21 1010 04/20/21 0307 04/29/21 1343  WBC 5.4 6.4 6.0  NEUTROABS  --  4.5 4.1  HGB 12.7 12.3 12.3  HCT 38.7 36.3 37.8  MCV 96.5 94.0 96.2  PLT 201 202 194   Lab Results  Component Value Date   TSH 1.635 04/20/2021   Lab Results  Component Value Date   HGBA1C 5.6 10/15/2013   No results found for: CHOL, HDL, LDLCALC, LDLDIRECT, TRIG, CHOLHDL  Significant Diagnostic Results in last 30 days:  CT Head Wo Contrast  Result Date: 05/23/2021 CLINICAL DATA:  Patient lost her balance and fell backwards striking posterior head. Large scalp hematoma. EXAM: CT HEAD WITHOUT CONTRAST CT CERVICAL SPINE WITHOUT CONTRAST TECHNIQUE: Multidetector CT imaging of the head and cervical spine was performed following the standard protocol without intravenous contrast. Multiplanar CT image reconstructions of the cervical spine were also generated. RADIATION DOSE REDUCTION: This exam was performed according to the departmental dose-optimization program which includes  automated exposure control, adjustment  of the mA and/or kV according to patient size and/or use of iterative reconstruction technique. COMPARISON:  04/19/2021 head CT. FINDINGS: CT HEAD FINDINGS Brain: There is no evidence for acute hemorrhage, hydrocephalus, mass lesion, or abnormal extra-axial fluid collection. No definite CT evidence for acute infarction. Diffuse loss of parenchymal volume is consistent with atrophy. Patchy low attenuation in the deep hemispheric and periventricular white matter is nonspecific, but likely reflects chronic microvascular ischemic demyelination. Vascular: No hyperdense vessel or unexpected calcification. Skull: No evidence for fracture. No worrisome lytic or sclerotic lesion. Sinuses/Orbits: The visualized paranasal sinuses and mastoid air cells are clear. Visualized portions of the globes and intraorbital fat are unremarkable. Other: None. CT CERVICAL SPINE FINDINGS Alignment: Normal. Skull base and vertebrae: No acute fracture. No primary bone lesion or focal pathologic process. Soft tissues and spinal canal: No prevertebral fluid or swelling. No visible canal hematoma. Disc levels: Loss of disc height noted C5-6 and C6-7. Facets are well aligned bilaterally. Upper chest: Unremarkable. Other: None. IMPRESSION: 1. No acute intracranial abnormality. 2. Atrophy with chronic small vessel ischemic disease. 3. Degenerative changes in the cervical spine without acute fracture or traumatic subluxation. Electronically Signed   By: Misty Stanley M.D.   On: 05/23/2021 13:46   CT Cervical Spine Wo Contrast  Result Date: 05/23/2021 CLINICAL DATA:  Patient lost her balance and fell backwards striking posterior head. Large scalp hematoma. EXAM: CT HEAD WITHOUT CONTRAST CT CERVICAL SPINE WITHOUT CONTRAST TECHNIQUE: Multidetector CT imaging of the head and cervical spine was performed following the standard protocol without intravenous contrast. Multiplanar CT image reconstructions of the cervical spine were also generated.  RADIATION DOSE REDUCTION: This exam was performed according to the departmental dose-optimization program which includes automated exposure control, adjustment of the mA and/or kV according to patient size and/or use of iterative reconstruction technique. COMPARISON:  04/19/2021 head CT. FINDINGS: CT HEAD FINDINGS Brain: There is no evidence for acute hemorrhage, hydrocephalus, mass lesion, or abnormal extra-axial fluid collection. No definite CT evidence for acute infarction. Diffuse loss of parenchymal volume is consistent with atrophy. Patchy low attenuation in the deep hemispheric and periventricular white matter is nonspecific, but likely reflects chronic microvascular ischemic demyelination. Vascular: No hyperdense vessel or unexpected calcification. Skull: No evidence for fracture. No worrisome lytic or sclerotic lesion. Sinuses/Orbits: The visualized paranasal sinuses and mastoid air cells are clear. Visualized portions of the globes and intraorbital fat are unremarkable. Other: None. CT CERVICAL SPINE FINDINGS Alignment: Normal. Skull base and vertebrae: No acute fracture. No primary bone lesion or focal pathologic process. Soft tissues and spinal canal: No prevertebral fluid or swelling. No visible canal hematoma. Disc levels: Loss of disc height noted C5-6 and C6-7. Facets are well aligned bilaterally. Upper chest: Unremarkable. Other: None. IMPRESSION: 1. No acute intracranial abnormality. 2. Atrophy with chronic small vessel ischemic disease. 3. Degenerative changes in the cervical spine without acute fracture or traumatic subluxation. Electronically Signed   By: Misty Stanley M.D.   On: 05/23/2021 13:46    Assessment/Plan 1. OAB (overactive bladder) - followed by Dr. Zigmund Daniel - med reconciliation done today - continue D-mannos with cranberry TID - discontinue cranberry 450 mg  - will hold  Myrbetriq x 2 weeks to see effectiveness  2. Parkinson's disease (tremor, stiffness, slow motion,  unstable posture) ([place]) - followed by neurology - recently moved to SNF due to frequent falls - cont sinemet - cont PT/OT  3. Recurrent major depressive disorder, in partial remission ([place]) -  no mood changes - supportive family - cont Lexapro    Family/ staff Communication: plan discussed with patient and nurse  Labs/tests ordered:  none

## 2021-06-22 ENCOUNTER — Encounter: Payer: Self-pay | Admitting: Adult Health

## 2021-06-22 ENCOUNTER — Non-Acute Institutional Stay (SKILLED_NURSING_FACILITY): Payer: Medicare Other | Admitting: Adult Health

## 2021-06-22 DIAGNOSIS — N3281 Overactive bladder: Secondary | ICD-10-CM | POA: Diagnosis not present

## 2021-06-22 DIAGNOSIS — F3341 Major depressive disorder, recurrent, in partial remission: Secondary | ICD-10-CM | POA: Diagnosis not present

## 2021-06-22 DIAGNOSIS — G20 Parkinson's disease: Secondary | ICD-10-CM

## 2021-06-22 NOTE — Progress Notes (Signed)
Location:  [facility] Room Number: 143-A Place of Service:  SNF [phone]) Provider:  Wyvonna Plum, MD  Patient Care Team: Virgie Dad, MD as PCP - General (Internal Medicine) Belva Crome, MD as Consulting Physician (Cardiology)  Extended Emergency Contact Information Primary Emergency Contact: [name] Address: [address]          [place], [place] [address] [name] of [place] Phone: [phone] Work Phone: [phone] Mobile Phone: [phone] Relation: Spouse Secondary Emergency Contact: Hanley Ben , [place] of [place] Phone: [phone] Relation: Granddaughter  Code Status:  Full Code Goals of care: Advanced Directive information    06/22/2021   10:14 AM  Advanced Directives  Does Patient Have a Medical Advance Directive? No  Does patient want to make changes to medical advance directive? No - Patient declined     Chief Complaint  Patient presents with   Acute Visit    Med questions    HPI:  Pt is a 88 y.o. female seen today for an acute visit for medication management She has PD and resides in skilled care where she moved recently. Her husband has asked to speak with me regarding her meds. Prior to moving to skilled he states she was not consistently taking her sinemet. Now she is taking the sinemet IR 6a, 11a, 4p, 8p and the CR dose at 9p.  The staff moved the morning dose to 6 because she was taking it between 7-8 and they found it had not become effective by the time she was ready to get up. She had trouble waiting to get up until it started working and became anxious. Her husband reports between breakfast and lunch she has tremors to arms and legs and falls asleep staring into space with her mouth open. She is easily awakened and can f/c.   Also he feels that she is depressed at times and wonders if we need to change the lexapro or change the dose.   Also she is on a trial off myrbetriq to see if it is needed. She is followed by urology. She has frequency at baseline and some incontinence. The resident has had frequency most of her life per the husband's account. One staff member feels she is urinating more frequently, another feels its the same. She has been off myrbetriq for 1 week.   Past Medical History:  Diagnosis Date   Anxiety    Arthritis    knee & back    Blood type, Rh negative    Breast cancer (HCC)    Chronic fatigue    Depression    Diverticulosis    Endocervical polyp    GERD (gastroesophageal reflux disease)    Hypertension    Hypothyroidism    IBS (irritable bowel syndrome)    Insomnia    Lymphedema    Neuromuscular disorder ([place])    Parkinson's disease    Osteopenia 06/2015   T score -2.4 distal third of the radius   Parkinson disease ([place])    RBBB 10/07/2013   Thyroid cancer ([place])    Past Surgical History:  Procedure Laterality Date   APPENDECTOMY  1979   BREAST SURGERY  1979   MASTECTOMY AND RECONSTRUCTION   DILATION AND CURETTAGE OF UTERUS     MISCARRIAGE   EYE SURGERY Bilateral    w/ IOL   HYSTEROSCOPY  2005,2009   AND D&C  MASTECTOMY Bilateral    THYROIDECTOMY     TONSILLECTOMY     TOTAL KNEE ARTHROPLASTY Right 10/07/2013   Procedure: RIGHT TOTAL KNEE ARTHROPLASTY;  Surgeon: Kerin Salen, MD;  Location: [facility];  Service: Orthopedics;  Laterality: Right;   TUBAL LIGATION      Allergies  Allergen Reactions   Sulfa Antibiotics Rash   Biaxin [Clarithromycin] Other (See Comments)    Pt does not remember reaction    Ciprofloxacin Nausea And Vomiting   Hctz [Hydrochlorothiazide] Other (See Comments)    weakness and hyponatremia   Macrobid [Nitrofurantoin] Nausea Only   Other Nausea Only   Prednisone Other (See Comments)    Swelling and facial redness    Sulfamethoxazole-Trimethoprim Other (See Comments) and Rash    Sore throat    Outpatient Encounter Medications as of 06/22/2021   Medication Sig   acetaminophen (TYLENOL) 500 MG tablet Take 1,000 mg by mouth every 8 (eight) hours as needed.   Ascorbic Acid (VITAMIN C PO) Take 1,000 mg by mouth daily.   aspirin EC 81 MG tablet Take 81 mg by mouth daily.   carbidopa-levodopa (SINEMET IR) 25-100 MG tablet Take 1 tablet by mouth 4 (four) times daily. 0800, 1100, 1500, 2000   Carbidopa-Levodopa ER (SINEMET CR) 25-100 MG tablet controlled release Take 1 tablet by mouth at bedtime.   cholecalciferol (VITAMIN D) 1000 units tablet Take 2,000 Units by mouth daily.   CRANBERRY-VITAMIN C-D MANNOSE PO Take 3 tablets by mouth daily.   escitalopram (LEXAPRO) 20 MG tablet Take 20 mg by mouth at bedtime.   irbesartan (AVAPRO) 75 MG tablet Take 37.5 mg by mouth every morning.   levothyroxine (SYNTHROID, LEVOTHROID) 112 MCG tablet Take 112 mcg by mouth daily before breakfast.   Multiple Vitamin (MULTIVITAMIN) tablet Take 1 tablet by mouth daily.   NONFORMULARY OR COMPOUNDED ITEM Estradiol vag cream 0.02% (0.'2mg'$ /ml)  S:  Insert 1 ml (1 syringe) into the vagina hs 2 times weekly as directed.   polyethylene glycol (MIRALAX / GLYCOLAX) 17 g packet Take 17 g by mouth daily.   rivastigmine (EXELON) 9.5 mg/24hr APPLY 1 PATCH ONTO THE SKIN EVERY DAY   mirabegron ER (MYRBETRIQ) 50 MG TB24 tablet Take 50 mg by mouth daily.   No facility-administered encounter medications on file as of 06/22/2021.    Review of Systems  Unable to perform ROS: Other   Immunization History  Administered Date(s) Administered   DTP 01/24/1994   Influenza Split 10/17/2008, 10/23/2009, 11/18/2011, 11/21/2012, 12/10/2019   Influenza Whole 10/09/2007   Influenza,inj,Quad PF,6+ Mos 10/26/2010   Influenza-Unspecified 11/05/2013, 10/31/2014, 10/12/2015, 10/20/2016, 11/15/2017, 11/13/2020   Moderna SARS-COV2 Booster Vaccination 11/28/2019, 05/28/2020, 11/06/2020   Moderna Sars-Covid-2 Vaccination 01/29/2019, 02/28/2019   Pneumococcal Conjugate-13 10/23/2014    Pneumococcal Polysaccharide-23 10/25/2002   Tdap 05/23/2021   Zoster, Live 10/13/2005   Zoster, Unspecified 10/13/2005   Pertinent  Health Maintenance Due  Topic Date Due   PAP SMEAR-Modifier  03/15/2013   INFLUENZA VACCINE  08/24/2021   DEXA SCAN  Completed      04/19/2021   10:11 AM 04/19/2021   11:00 PM 04/20/2021   12:00 PM 04/29/2021   12:25 PM 05/23/2021   12:01 PM  Fall Risk  Patient Fall Risk Level High fall risk High fall risk High fall risk Low fall risk Moderate fall risk   Functional Status Survey:    Vitals:   06/22/21 1011  BP: 134/76  Pulse: 75  Resp: 17  Temp: 98.1 F (36.7 C)  SpO2: 99%  Weight: 117 lb 9.6 oz (53.3 kg)  Height: '5\' 3"'$  (1.6 m)   Body mass index is 20.83 kg/m. Physical Exam Vitals and nursing note reviewed.  Constitutional:      General: She is not in acute distress.    Appearance: She is not diaphoretic.  HENT:     Head: Normocephalic and atraumatic.  Neck:     Vascular: No JVD.  Cardiovascular:     Rate and Rhythm: Normal rate and regular rhythm.     Heart sounds: No murmur heard. Pulmonary:     Effort: Pulmonary effort is normal. No respiratory distress.     Breath sounds: Normal breath sounds. No wheezing.  Abdominal:     General: Bowel sounds are normal. There is no distension.     Palpations: Abdomen is soft.     Tenderness: There is no abdominal tenderness. There is no right CVA tenderness or left CVA tenderness.  Skin:    General: Skin is warm and dry.  Neurological:     Mental Status: She is alert.     Cranial Nerves: No cranial nerve deficit.     Motor: No weakness.     Deep Tendon Reflexes: Reflexes normal.     Comments: Resting tremors, jerking noted to BUE and BLE. Asleep but easily arouses. Bradykinesia, and mild rigidity noted. Dysphonia noted.     Labs reviewed: Recent Labs    04/19/21 1010 04/20/21 0307 04/29/21 1343  NA 138 138 138  K 3.5 3.2* 3.9  CL 106 108 107  CO2 '25 25 26  '$ GLUCOSE 95 98 94   BUN 26* 24* 26*  CREATININE 0.88 0.78 0.87  CALCIUM 8.6* 8.5* 8.4*   Recent Labs    04/19/21 1010 04/29/21 1343  AST 15 18  ALT 9 17  ALKPHOS 59 59  BILITOT 1.2 0.6  PROT 6.1* 5.9*  ALBUMIN 3.4* 3.3*   Recent Labs    04/19/21 1010 04/20/21 0307 04/29/21 1343  WBC 5.4 6.4 6.0  NEUTROABS  --  4.5 4.1  HGB 12.7 12.3 12.3  HCT 38.7 36.3 37.8  MCV 96.5 94.0 96.2  PLT 201 202 194   Lab Results  Component Value Date   TSH 1.635 04/20/2021   Lab Results  Component Value Date   HGBA1C 5.6 10/15/2013   No results found for: CHOL, HDL, LDLCALC, LDLDIRECT, TRIG, CHOLHDL  Significant Diagnostic Results in last 30 days:  CT Head Wo Contrast  Result Date: 05/23/2021 CLINICAL DATA:  Patient lost her balance and fell backwards striking posterior head. Large scalp hematoma. EXAM: CT HEAD WITHOUT CONTRAST CT CERVICAL SPINE WITHOUT CONTRAST TECHNIQUE: Multidetector CT imaging of the head and cervical spine was performed following the standard protocol without intravenous contrast. Multiplanar CT image reconstructions of the cervical spine were also generated. RADIATION DOSE REDUCTION: This exam was performed according to the departmental dose-optimization program which includes automated exposure control, adjustment of the mA and/or kV according to patient size and/or use of iterative reconstruction technique. COMPARISON:  04/19/2021 head CT. FINDINGS: CT HEAD FINDINGS Brain: There is no evidence for acute hemorrhage, hydrocephalus, mass lesion, or abnormal extra-axial fluid collection. No definite CT evidence for acute infarction. Diffuse loss of parenchymal volume is consistent with atrophy. Patchy low attenuation in the deep hemispheric and periventricular white matter is nonspecific, but likely reflects chronic microvascular ischemic demyelination. Vascular: No hyperdense vessel or unexpected calcification. Skull: No evidence for fracture. No worrisome lytic or sclerotic lesion.  Sinuses/Orbits: The visualized  paranasal sinuses and mastoid air cells are clear. Visualized portions of the globes and intraorbital fat are unremarkable. Other: None. CT CERVICAL SPINE FINDINGS Alignment: Normal. Skull base and vertebrae: No acute fracture. No primary bone lesion or focal pathologic process. Soft tissues and spinal canal: No prevertebral fluid or swelling. No visible canal hematoma. Disc levels: Loss of disc height noted C5-6 and C6-7. Facets are well aligned bilaterally. Upper chest: Unremarkable. Other: None. IMPRESSION: 1. No acute intracranial abnormality. 2. Atrophy with chronic small vessel ischemic disease. 3. Degenerative changes in the cervical spine without acute fracture or traumatic subluxation. Electronically Signed   By: Misty Stanley M.D.   On: 05/23/2021 13:46   CT Cervical Spine Wo Contrast  Result Date: 05/23/2021 CLINICAL DATA:  Patient lost her balance and fell backwards striking posterior head. Large scalp hematoma. EXAM: CT HEAD WITHOUT CONTRAST CT CERVICAL SPINE WITHOUT CONTRAST TECHNIQUE: Multidetector CT imaging of the head and cervical spine was performed following the standard protocol without intravenous contrast. Multiplanar CT image reconstructions of the cervical spine were also generated. RADIATION DOSE REDUCTION: This exam was performed according to the departmental dose-optimization program which includes automated exposure control, adjustment of the mA and/or kV according to patient size and/or use of iterative reconstruction technique. COMPARISON:  04/19/2021 head CT. FINDINGS: CT HEAD FINDINGS Brain: There is no evidence for acute hemorrhage, hydrocephalus, mass lesion, or abnormal extra-axial fluid collection. No definite CT evidence for acute infarction. Diffuse loss of parenchymal volume is consistent with atrophy. Patchy low attenuation in the deep hemispheric and periventricular white matter is nonspecific, but likely reflects chronic microvascular  ischemic demyelination. Vascular: No hyperdense vessel or unexpected calcification. Skull: No evidence for fracture. No worrisome lytic or sclerotic lesion. Sinuses/Orbits: The visualized paranasal sinuses and mastoid air cells are clear. Visualized portions of the globes and intraorbital fat are unremarkable. Other: None. CT CERVICAL SPINE FINDINGS Alignment: Normal. Skull base and vertebrae: No acute fracture. No primary bone lesion or focal pathologic process. Soft tissues and spinal canal: No prevertebral fluid or swelling. No visible canal hematoma. Disc levels: Loss of disc height noted C5-6 and C6-7. Facets are well aligned bilaterally. Upper chest: Unremarkable. Other: None. IMPRESSION: 1. No acute intracranial abnormality. 2. Atrophy with chronic small vessel ischemic disease. 3. Degenerative changes in the cervical spine without acute fracture or traumatic subluxation. Electronically Signed   By: Misty Stanley M.D.   On: 05/23/2021 13:46    Assessment/Plan  1. Parkinson's disease (tremor, stiffness, slow motion, unstable posture) ([place]) Having some symptoms as med is wearing off Discussed with Jinny Blossom NP with neurology. She recommends taking the sinemet every 4 hrs and the long acting med at bedtime. If this continues she may add a long acting dose of sinemet during the day.   2. OAB (overactive bladder) Would give her another week off myrbetriq with a consistent nurse to eval if symptoms are present and if she needs this med  3. Recurrent major depressive disorder, in partial remission ([place]) She is having some difficulty adjusting to skilled care We are trying to sort out her PD symptoms and I would like to work on this first before considering a med change. Also would be good to give her more time to adjust to skilled care.     Family/ staff Communication: discussed with her husband and her neurology NP Megan   Labs/tests ordered:  NA

## 2021-06-23 ENCOUNTER — Encounter: Payer: Medicare Other | Admitting: Internal Medicine

## 2021-06-28 ENCOUNTER — Non-Acute Institutional Stay (SKILLED_NURSING_FACILITY): Payer: Medicare Other | Admitting: Internal Medicine

## 2021-06-28 ENCOUNTER — Encounter: Payer: Self-pay | Admitting: Internal Medicine

## 2021-06-28 DIAGNOSIS — G20 Parkinson's disease: Secondary | ICD-10-CM | POA: Diagnosis not present

## 2021-06-28 DIAGNOSIS — F3341 Major depressive disorder, recurrent, in partial remission: Secondary | ICD-10-CM | POA: Diagnosis not present

## 2021-06-28 DIAGNOSIS — E890 Postprocedural hypothyroidism: Secondary | ICD-10-CM

## 2021-06-28 DIAGNOSIS — K5901 Slow transit constipation: Secondary | ICD-10-CM

## 2021-06-28 DIAGNOSIS — R296 Repeated falls: Secondary | ICD-10-CM

## 2021-06-28 DIAGNOSIS — G4719 Other hypersomnia: Secondary | ICD-10-CM

## 2021-06-28 DIAGNOSIS — I10 Essential (primary) hypertension: Secondary | ICD-10-CM

## 2021-06-28 DIAGNOSIS — R4189 Other symptoms and signs involving cognitive functions and awareness: Secondary | ICD-10-CM

## 2021-06-28 DIAGNOSIS — G20A1 Parkinson's disease without dyskinesia, without mention of fluctuations: Secondary | ICD-10-CM

## 2021-06-28 DIAGNOSIS — N3281 Overactive bladder: Secondary | ICD-10-CM | POA: Diagnosis not present

## 2021-06-28 NOTE — Progress Notes (Unsigned)
Location:   [facility] Room Number: 076 Place of Service:  SNF [phone]) Provider:  Veleta Miners MD   Virgie Dad, MD  Patient Care Team: Virgie Dad, MD as PCP - General (Internal Medicine) Belva Crome, MD as Consulting Physician (Cardiology)  Extended Emergency Contact Information Primary Emergency Contact: [name] Address: [address]          [place], [place] [address] [name] of [place] Phone: [phone] Work Phone: [phone] Mobile Phone: [phone] Relation: Spouse Secondary Emergency Contact: Hanley Ben , [place] of [place] Phone: [phone] Relation: Granddaughter  Code Status:  Full Code Goals of care: Advanced Directive information    06/28/2021   11:18 AM  Advanced Directives  Does Patient Have a Medical Advance Directive? Yes  Does patient want to make changes to medical advance directive? No - Patient declined  Copy of [place] in Chart? No - copy requested     Chief Complaint  Patient presents with   Acute Visit    HPI:  Pt is a 88 y.o. female seen today for an acute visit for Discussing her Meds and her depression Recent Admit to SNF for long term care as she was falling in her apartment  She has h/o  Patient has sees Dr. Rexene Alberts for her Parkinson.  Has seen [name] before.  Has seen Dr. Mervyn Skeeters at [facility] Patient has had extensive work-up  h/o cognitive impairment  Hypertension Hypothyroidism Depression Urinary In continence Osteoporosis   Patient has had number of visits to the hospital.  She was also admitted from 3/27 to 3/28 for change in mental status.  Ruled out for infection.MRI brain did not show any acute abnormality, showed moderate chronic small vessel ischemic disease and brain atrophy.  Also was sent to ED on 04/06 for altered mental status.  And was recommended to get a sleep study [facility] On 04/30  Her husband  in the room Parkinson Disease Per him she is now doing well since her Sinemet was scheduled Her tremors have improved She c/o Voice change and how she gets tired sometimes especially end of the day Neuro Planning for Sleep study Cognition C/o noticed Memory issues Also difficulty coming up with right words Depression  Says she is depressed Working with therapy and is able to walk with very mild assist  Also want Mybetriq to be discontinued due to cost and ineffective Past Medical History:  Diagnosis Date   Anxiety    Arthritis    knee & back    Blood type, Rh negative    Breast cancer (HCC)    Chronic fatigue    Depression    Diverticulosis    Endocervical polyp    GERD (gastroesophageal reflux disease)    Hypertension    Hypothyroidism    IBS (irritable bowel syndrome)    Insomnia    Lymphedema    Neuromuscular disorder ([place])    Parkinson's disease    Osteopenia 06/2015   T score -2.4 distal third of the radius   Parkinson disease ([place])    RBBB 10/07/2013   Thyroid cancer ([place])    Past Surgical History:  Procedure Laterality Date   APPENDECTOMY  1979   BREAST SURGERY  1979   MASTECTOMY AND RECONSTRUCTION   DILATION AND CURETTAGE OF UTERUS     MISCARRIAGE   EYE SURGERY Bilateral    w/ IOL  HYSTEROSCOPY  [id number]   AND D&C   MASTECTOMY Bilateral    THYROIDECTOMY     TONSILLECTOMY     TOTAL KNEE ARTHROPLASTY Right 10/07/2013   Procedure: RIGHT TOTAL KNEE ARTHROPLASTY;  Surgeon: Kerin Salen, MD;  Location: [facility];  Service: Orthopedics;  Laterality: Right;   TUBAL LIGATION      Allergies  Allergen Reactions   Sulfa Antibiotics Rash   Biaxin [Clarithromycin] Other (See Comments)    Pt does not remember reaction    Ciprofloxacin Nausea And Vomiting   Hctz [Hydrochlorothiazide] Other (See Comments)    weakness and hyponatremia   Macrobid [Nitrofurantoin] Nausea Only   Other Nausea Only   Prednisone Other (See Comments)    Swelling and facial redness     Sulfamethoxazole-Trimethoprim Other (See Comments) and Rash    Sore throat    Allergies as of 06/28/2021       Reactions   Sulfa Antibiotics Rash   Biaxin [clarithromycin] Other (See Comments)   Pt does not remember reaction   Ciprofloxacin Nausea And Vomiting   Hctz [hydrochlorothiazide] Other (See Comments)   weakness and hyponatremia   Macrobid [nitrofurantoin] Nausea Only   Other Nausea Only   Prednisone Other (See Comments)   Swelling and facial redness   Sulfamethoxazole-trimethoprim Other (See Comments), Rash   Sore throat        Medication List        Accurate as of June 28, 2021 11:19 AM. If you have any questions, ask your nurse or doctor.          STOP taking these medications    NONFORMULARY OR COMPOUNDED ITEM Stopped by: Virgie Dad, MD       TAKE these medications    acetaminophen 500 MG tablet Commonly known as: TYLENOL Take 1,000 mg by mouth every 8 (eight) hours as needed.   aspirin EC 81 MG tablet Take 81 mg by mouth daily.   carbidopa-levodopa 25-100 MG tablet Commonly known as: SINEMET IR Take 1 tablet by mouth 4 (four) times daily. 0800, 1100, 1500, 2000   Carbidopa-Levodopa ER 25-100 MG tablet controlled release Commonly known as: SINEMET CR Take 1 tablet by mouth at bedtime.   cholecalciferol 1000 units tablet Commonly known as: VITAMIN D Take 2,000 Units by mouth daily.   CRANBERRY-VITAMIN C-D MANNOSE PO Take 3 tablets by mouth daily.   escitalopram 20 MG tablet Commonly known as: LEXAPRO Take 20 mg by mouth at bedtime.   irbesartan 75 MG tablet Commonly known as: AVAPRO Take 37.5 mg by mouth every morning.   levothyroxine 112 MCG tablet Commonly known as: SYNTHROID Take 112 mcg by mouth daily before breakfast.   mirabegron ER 50 MG Tb24 tablet Commonly known as: MYRBETRIQ Take 50 mg by mouth daily.   multivitamin tablet Take 1 tablet by mouth daily.   NON FORMULARY estradiol vaginal cream; 0.2%  (0.'2mg'$ /ml); amt: 1 mL; vaginal Special Instructions: Insert 1 mL into the vagina at bedtime 2 times weekly as directed. At Bedtime on Sun   polyethylene glycol 17 g packet Commonly known as: MIRALAX / GLYCOLAX Take 17 g by mouth daily.   rivastigmine 9.5 mg/24hr Commonly known as: EXELON APPLY 1 PATCH ONTO THE SKIN EVERY DAY   VITAMIN C PO Take 1,000 mg by mouth daily.        Review of Systems  Constitutional:  Positive for activity change. Negative for appetite change.  HENT: Negative.    Respiratory:  Negative for cough and  shortness of breath.   Cardiovascular:  Negative for leg swelling.  Gastrointestinal:  Negative for constipation.  Genitourinary:  Positive for frequency and urgency.  Musculoskeletal:  Positive for gait problem. Negative for arthralgias and myalgias.  Skin: Negative.   Neurological:  Positive for weakness. Negative for dizziness.  Psychiatric/Behavioral:  Positive for dysphoric mood. Negative for confusion and sleep disturbance.    Immunization History  Administered Date(s) Administered   DTP 01/24/1994   Influenza Split 10/17/2008, 10/23/2009, 11/18/2011, 11/21/2012, 12/10/2019   Influenza Whole 10/09/2007   Influenza,inj,Quad PF,6+ Mos 10/26/2010   Influenza-Unspecified 11/05/2013, 10/31/2014, 10/12/2015, 10/20/2016, 11/15/2017, 11/13/2020   Moderna SARS-COV2 Booster Vaccination 11/28/2019, 05/28/2020, 11/06/2020   Moderna Sars-Covid-2 Vaccination 01/29/2019, 02/28/2019   Pneumococcal Conjugate-13 10/23/2014   Pneumococcal Polysaccharide-23 10/25/2002   Tdap 05/23/2021   Zoster, Live 10/13/2005   Zoster, Unspecified 10/13/2005   Pertinent  Health Maintenance Due  Topic Date Due   PAP SMEAR-Modifier  03/15/2013   INFLUENZA VACCINE  08/24/2021   DEXA SCAN  Completed      04/19/2021   10:11 AM 04/19/2021   11:00 PM 04/20/2021   12:00 PM 04/29/2021   12:25 PM 05/23/2021   12:01 PM  Fall Risk  Patient Fall Risk Level High fall risk High fall  risk High fall risk Low fall risk Moderate fall risk   Functional Status Survey:    Vitals:   06/28/21 1028  BP: (!) 149/85  Pulse: 84  Resp: 18  Temp: (!) 97.5 F (36.4 C)  SpO2: 96%  Weight: 115 lb 6.4 oz (52.3 kg)  Height: '5\' 3"'$  (1.6 m)   Body mass index is 20.44 kg/m. Physical Exam Vitals reviewed.  Constitutional:      Appearance: Normal appearance.  HENT:     Head: Normocephalic.     Nose: Nose normal.     Mouth/Throat:     Mouth: Mucous membranes are moist.     Pharynx: Oropharynx is clear.  Eyes:     Pupils: Pupils are equal, round, and reactive to light.  Cardiovascular:     Rate and Rhythm: Normal rate and regular rhythm.     Pulses: Normal pulses.     Heart sounds: Normal heart sounds. No murmur heard. Pulmonary:     Effort: Pulmonary effort is normal.     Breath sounds: Normal breath sounds.  Abdominal:     General: Abdomen is flat. Bowel sounds are normal.     Palpations: Abdomen is soft.  Musculoskeletal:        General: No swelling.     Cervical back: Neck supple.  Skin:    General: Skin is warm.  Neurological:     General: No focal deficit present.     Mental Status: She is alert and oriented to person, place, and time.     Comments: No Resting tremor today Mild Rigidity Was able to stand up and Walk with Therapy  Psychiatric:        Mood and Affect: Mood normal.        Thought Content: Thought content normal.    Labs reviewed: Recent Labs    04/19/21 1010 04/20/21 0307 04/29/21 1343  NA 138 138 138  K 3.5 3.2* 3.9  CL 106 108 107  CO2 '25 25 26  '$ GLUCOSE 95 98 94  BUN 26* 24* 26*  CREATININE 0.88 0.78 0.87  CALCIUM 8.6* 8.5* 8.4*   Recent Labs    04/19/21 1010 04/29/21 1343  AST 15 18  ALT 9 17  ALKPHOS 59 59  BILITOT 1.2 0.6  PROT 6.1* 5.9*  ALBUMIN 3.4* 3.3*   Recent Labs    04/19/21 1010 04/20/21 0307 04/29/21 1343  WBC 5.4 6.4 6.0  NEUTROABS  --  4.5 4.1  HGB 12.7 12.3 12.3  HCT 38.7 36.3 37.8  MCV 96.5 94.0  96.2  PLT 201 202 194   Lab Results  Component Value Date   TSH 1.635 04/20/2021   Lab Results  Component Value Date   HGBA1C 5.6 10/15/2013   No results found for: CHOL, HDL, LDLCALC, LDLDIRECT, TRIG, CHOLHDL  Significant Diagnostic Results in last 30 days:  No results found.  Assessment/Plan 1. Parkinson's disease (tremor, stiffness, slow motion, unstable posture) (HCC) Doing well on Sinemet 4/day with CR at night Follow with Neuro  2. OAB (overactive bladder) Discontinue Myrbetriq at their request  3. Recurrent major depressive disorder, in partial remission (HCC) Add Wellbutrin 150 mg to her Lexapro  4. Recurrent falls Working with therapy Now in SNF   5. Hypothyroidism, postsurgical history of thyroid cancer TSH normal in 03/23  6. Excessive daytime sleepiness Neuro planning to do Sleep study  7. Essential hypertension Avapro. Off Norvasc Loose control due to falls  8. Cognitive impairment On Ex[place] Patch MMSE 23/30  10. Slow transit constipation Add senna one tab QD    Family/ staff Communication:  Total time spent in this patient care encounter was  45_  minutes; greater than 50% of the visit spent counseling patient and staff, reviewing records , Labs and coordinating care for problems addressed at this encounter.    Labs/tests ordered:

## 2021-07-06 ENCOUNTER — Encounter: Payer: Self-pay | Admitting: Orthopedic Surgery

## 2021-07-06 ENCOUNTER — Non-Acute Institutional Stay (SKILLED_NURSING_FACILITY): Payer: Medicare Other | Admitting: Orthopedic Surgery

## 2021-07-06 DIAGNOSIS — S51011A Laceration without foreign body of right elbow, initial encounter: Secondary | ICD-10-CM | POA: Diagnosis not present

## 2021-07-06 DIAGNOSIS — S0083XA Contusion of other part of head, initial encounter: Secondary | ICD-10-CM

## 2021-07-06 DIAGNOSIS — G20 Parkinson's disease: Secondary | ICD-10-CM

## 2021-07-06 DIAGNOSIS — G20A1 Parkinson's disease without dyskinesia, without mention of fluctuations: Secondary | ICD-10-CM

## 2021-07-06 DIAGNOSIS — R296 Repeated falls: Secondary | ICD-10-CM

## 2021-07-06 DIAGNOSIS — W19XXXA Unspecified fall, initial encounter: Secondary | ICD-10-CM

## 2021-07-06 DIAGNOSIS — R4189 Other symptoms and signs involving cognitive functions and awareness: Secondary | ICD-10-CM

## 2021-07-06 NOTE — Progress Notes (Addendum)
Location:  [facility] Room Number: 143/A Place of Service:  SNF [phone]) Provider: Yvonna Alanis, NP   Patient Care Team: Virgie Dad, MD as PCP - General (Internal Medicine) Belva Crome, MD as Consulting Physician (Cardiology)  Extended Emergency Contact Information Primary Emergency Contact: [name] Address: [address]          [place], [place] [address] [name] of [place] Phone: [phone] Work Phone: [phone] Mobile Phone: [phone] Relation: Spouse Secondary Emergency Contact: Hanley Ben , [place] of [place] Phone: [phone] Relation: Granddaughter  Code Status:  Full code Goals of care: Advanced Directive information    07/06/2021    2:48 PM  Advanced Directives  Does Patient Have a Medical Advance Directive? No  Does patient want to make changes to medical advance directive? No - Patient declined     Chief Complaint  Patient presents with   Acute Visit    Fall    HPI:  Pt is a 88 y.o. female seen today for an acute visit due to recent fall.   06/11 nursing reports they assisted her to the bathroom and left room for a brief period of time to give her privacy. She was found on the bathroom floor by her husband.She hit the back of her head and developed a hematoma. She also has a skin tear to right elbow. Vitals and neuro checks normal. Today, she denies pain. She is able to bear weight and move extremities without difficulty. Nursing staff now remains with her during toileting.   Parkinson's- recently moved to skilled nursing, followed by neurology- recently ordered sleep study, reports napping more during day, remains on scheduled sinemet Impaired cognition- MMSE 22/30 06/13/2021, husband reports decline in memory   Past Medical History:  Diagnosis Date   Anxiety    Arthritis    knee & back    Blood type, Rh negative    Breast cancer (HCC)    Chronic  fatigue    Depression    Diverticulosis    Endocervical polyp    GERD (gastroesophageal reflux disease)    Hypertension    Hypothyroidism    IBS (irritable bowel syndrome)    Insomnia    Lymphedema    Neuromuscular disorder ([place])    Parkinson's disease    Osteopenia 06/2015   T score -2.4 distal third of the radius   Parkinson disease ([place])    RBBB 10/07/2013   Thyroid cancer ([place])    Past Surgical History:  Procedure Laterality Date   APPENDECTOMY  1979   BREAST SURGERY  1979   MASTECTOMY AND RECONSTRUCTION   DILATION AND CURETTAGE OF UTERUS     MISCARRIAGE   EYE SURGERY Bilateral    w/ IOL   HYSTEROSCOPY  2005,2009   AND D&C   MASTECTOMY Bilateral    THYROIDECTOMY     TONSILLECTOMY     TOTAL KNEE ARTHROPLASTY Right 10/07/2013   Procedure: RIGHT TOTAL KNEE ARTHROPLASTY;  Surgeon: Kerin Salen, MD;  Location: [facility];  Service: Orthopedics;  Laterality: Right;   TUBAL LIGATION      Allergies  Allergen Reactions   Sulfa Antibiotics Rash   Biaxin [Clarithromycin] Other (See Comments)    Pt does not remember reaction    Ciprofloxacin Nausea And Vomiting   Hctz [Hydrochlorothiazide] Other (See Comments)    weakness and hyponatremia   Macrobid [Nitrofurantoin] Nausea Only   Other Nausea  Only   Prednisone Other (See Comments)    Swelling and facial redness    Sulfamethoxazole-Trimethoprim Other (See Comments) and Rash    Sore throat    Outpatient Encounter Medications as of 07/06/2021  Medication Sig   acetaminophen (TYLENOL) 500 MG tablet Take 1,000 mg by mouth every 8 (eight) hours as needed.   Ascorbic Acid (VITAMIN C PO) Take 1,000 mg by mouth daily.   aspirin EC 81 MG tablet Take 81 mg by mouth daily.   buPROPion (WELLBUTRIN XL) 150 MG 24 hr tablet Take 150 mg by mouth daily.   carbidopa-levodopa (SINEMET IR) 25-100 MG tablet Take 1 tablet by mouth 4 (four) times daily. 0800, 1100, 1500, 2000   Carbidopa-Levodopa ER (SINEMET CR) 25-100 MG tablet controlled  release Take 1 tablet by mouth at bedtime.   cholecalciferol (VITAMIN D) 1000 units tablet Take 2,000 Units by mouth daily.   CRANBERRY-VITAMIN C-D MANNOSE PO Take 1 tablet by mouth 3 (three) times daily.   escitalopram (LEXAPRO) 20 MG tablet Take 20 mg by mouth at bedtime.   irbesartan (AVAPRO) 75 MG tablet Take 37.5 mg by mouth every morning.   levothyroxine (SYNTHROID, LEVOTHROID) 112 MCG tablet Take 112 mcg by mouth daily before breakfast.   Multiple Vitamin (MULTIVITAMIN) tablet Take 1 tablet by mouth daily.   NON FORMULARY estradiol vaginal cream; 0.2% (0.'2mg'$ /ml); amt: 1 mL; vaginal Special Instructions: Insert 1 mL into the vagina at bedtime 2 times weekly as directed. At Bedtime on Sun   polyethylene glycol (MIRALAX / GLYCOLAX) 17 g packet Take 17 g by mouth daily.   rivastigmine (EXELON) 9.5 mg/24hr APPLY 1 PATCH ONTO THE SKIN EVERY DAY   senna-docusate (SENOKOT-S) 8.6-50 MG tablet Take 1 tablet by mouth at bedtime.   No facility-administered encounter medications on file as of 07/06/2021.    Review of Systems  Constitutional:  Negative for activity change, appetite change, chills, fatigue and fever.  HENT:  Negative for congestion and trouble swallowing.   Respiratory:  Negative for cough, shortness of breath and wheezing.   Cardiovascular:  Negative for chest pain and leg swelling.  Gastrointestinal:  Negative for abdominal distention, abdominal pain, constipation, diarrhea, nausea and vomiting.  Genitourinary:  Negative for dysuria and frequency.  Musculoskeletal:  Positive for arthralgias, back pain and gait problem.  Skin:  Positive for wound.  Neurological:  Positive for tremors and weakness. Negative for dizziness and headaches.  Psychiatric/Behavioral:  Positive for confusion. Negative for dysphoric mood. The patient is not nervous/anxious.     Immunization History  Administered Date(s) Administered   DTP 01/24/1994   Influenza Split 10/17/2008, 10/23/2009,  11/18/2011, 11/21/2012, 12/10/2019   Influenza Whole 10/09/2007   Influenza,inj,Quad PF,6+ Mos 10/26/2010   Influenza-Unspecified 11/05/2013, 10/31/2014, 10/12/2015, 10/20/2016, 11/15/2017, 11/13/2020   Moderna SARS-COV2 Booster Vaccination 11/28/2019, 05/28/2020, 11/06/2020   Moderna Sars-Covid-2 Vaccination 01/29/2019, 02/28/2019   Pneumococcal Conjugate-13 10/23/2014   Pneumococcal Polysaccharide-23 10/25/2002   Tdap 05/23/2021   Zoster, Live 10/13/2005   Zoster, Unspecified 10/13/2005   Pertinent  Health Maintenance Due  Topic Date Due   PAP SMEAR-Modifier  03/15/2013   INFLUENZA VACCINE  08/24/2021   DEXA SCAN  Completed      04/19/2021   10:11 AM 04/19/2021   11:00 PM 04/20/2021   12:00 PM 04/29/2021   12:25 PM 05/23/2021   12:01 PM  Fall Risk  Patient Fall Risk Level High fall risk High fall risk High fall risk Low fall risk Moderate fall risk   Functional Status  Survey:    Vitals:   07/06/21 1442  BP: 135/79  Pulse: 70  Resp: 18  Temp: (!) 97 F (36.1 C)  SpO2: 92%  Weight: 115 lb 6.4 oz (52.3 kg)  Height: '5\' 3"'$  (1.6 m)   Body mass index is 20.44 kg/m. Physical Exam Vitals reviewed.  Constitutional:      General: She is not in acute distress. HENT:     Head: Normocephalic. No raccoon eyes, Battle's sign or laceration.     Comments: Quarter sized hematoma to occipital, tender to touch, no skin breakdown Eyes:     General:        Right eye: No discharge.        Left eye: No discharge.  Cardiovascular:     Rate and Rhythm: Normal rate and regular rhythm.     Pulses: Normal pulses.     Heart sounds: Normal heart sounds.  Pulmonary:     Effort: Pulmonary effort is normal. No respiratory distress.     Breath sounds: Normal breath sounds. No wheezing.  Abdominal:     General: Bowel sounds are normal. There is no distension.     Palpations: Abdomen is soft.     Tenderness: There is no abdominal tenderness.  Musculoskeletal:     Cervical back: Neck supple.      Right lower leg: No edema.     Left lower leg: No edema.  Skin:    General: Skin is warm and dry.     Capillary Refill: Capillary refill takes less than 2 seconds.     Comments: Small, dime sized abrasion to right elbow, CDI, serous drainage, surrounding skin intact  Neurological:     General: No focal deficit present.     Mental Status: She is alert. Mental status is at baseline.     Motor: Weakness present.     Gait: Gait abnormal.     Comments: Mild hand tremor, wheelchair  Psychiatric:        Mood and Affect: Mood normal.        Behavior: Behavior normal.        Cognition and Memory: Cognition is impaired. Memory is impaired.     Comments: Very pleasant, follows commands, speech slightly slurred, alert to self/person/situation     Labs reviewed: Recent Labs    04/19/21 1010 04/20/21 0307 04/29/21 1343  NA 138 138 138  K 3.5 3.2* 3.9  CL 106 108 107  CO2 '25 25 26  '$ GLUCOSE 95 98 94  BUN 26* 24* 26*  CREATININE 0.88 0.78 0.87  CALCIUM 8.6* 8.5* 8.4*   Recent Labs    04/19/21 1010 04/29/21 1343  AST 15 18  ALT 9 17  ALKPHOS 59 59  BILITOT 1.2 0.6  PROT 6.1* 5.9*  ALBUMIN 3.4* 3.3*   Recent Labs    04/19/21 1010 04/20/21 0307 04/29/21 1343  WBC 5.4 6.4 6.0  NEUTROABS  --  4.5 4.1  HGB 12.7 12.3 12.3  HCT 38.7 36.3 37.8  MCV 96.5 94.0 96.2  PLT 201 202 194   Lab Results  Component Value Date   TSH 1.635 04/20/2021   Lab Results  Component Value Date   HGBA1C 5.6 10/15/2013   No results found for: "CHOL", "HDL", "LDLCALC", "LDLDIRECT", "TRIG", "CHOLHDL"  Significant Diagnostic Results in last 30 days:  No results found.  Assessment/Plan 1. Frequent falls - 06/11 found on bathroom floor - able to move extremities without difficulty today - high risk of falls due  to parkinson's - cont falls safety precautions - cont supervised toileting - cont PT/OT  - see below  2. Hematoma of occipital surface of head - quarter sized hematoma,  tender, no skin breakdown  3. Skin tear of elbow without complication, right, initial encounter - dime sized skin tear to right elbow, CDI - cont dressing changes with foam dressing prn  4. Parkinson's disease (tremor, stiffness, slow motion, unstable posture) ([place]) - followed by neurology- sleep study ordered - recently moved to SNF- doing well - cont sinemet  5. Cognitive impairment - MMSE 22/30 06/13/2021 - suspect related to parkinson's  - cont skilled nursing care  Family/ staff Communication: plan discussed with patient, husband and nurse  Labs/tests ordered:  none

## 2021-07-07 ENCOUNTER — Encounter: Payer: Self-pay | Admitting: Orthopedic Surgery

## 2021-07-07 ENCOUNTER — Non-Acute Institutional Stay (SKILLED_NURSING_FACILITY): Payer: Medicare Other | Admitting: Orthopedic Surgery

## 2021-07-07 DIAGNOSIS — S0083XA Contusion of other part of head, initial encounter: Secondary | ICD-10-CM

## 2021-07-07 DIAGNOSIS — G20 Parkinson's disease: Secondary | ICD-10-CM

## 2021-07-07 DIAGNOSIS — R4189 Other symptoms and signs involving cognitive functions and awareness: Secondary | ICD-10-CM | POA: Diagnosis not present

## 2021-07-07 DIAGNOSIS — R4781 Slurred speech: Secondary | ICD-10-CM

## 2021-07-07 DIAGNOSIS — G4719 Other hypersomnia: Secondary | ICD-10-CM

## 2021-07-07 NOTE — Progress Notes (Signed)
Location:  [name] Room Number: 143/A Place of Service:  SNF [phone]) Provider: Yvonna Alanis, NP   Patient Care Team: Virgie Dad, MD as PCP - General (Internal Medicine) Belva Crome, MD as Consulting Physician (Cardiology)  Extended Emergency Contact Information Primary Emergency Contact: [name] Address: [address]          [place], [place] [address] [name] of [place] Phone: [phone] Work Phone: [phone] Mobile Phone: [phone] Relation: Spouse Secondary Emergency Contact: Hanley Ben , [place] of [place] Phone: [phone] Relation: Granddaughter  Code Status:  Full Code Goals of care: Advanced Directive information    07/07/2021    2:42 PM  Advanced Directives  Does Patient Have a Medical Advance Directive? No  Does patient want to make changes to medical advance directive? No - Patient declined     Chief Complaint  Patient presents with   Acute Visit    Slurred speech    HPI:  Pt is a 88 y.o. female seen today for an acute visit due to slurred speech.   06/14 husband reports increased mouth breathing and slurred speech. She is able to follow my commands. Smile symmetrical. She is able to say her name and state she lives in. Mouth appears dry. She has not had anything to drink in awhile. Vitals stable.   In addition, husband reports increased fatigue and odd behaviors. She was noted to eat a " imaginary lunch" with her fingers just awhile ago. Sleep study was recommended by neurology, but had not been completed. MMSE 22/30 06/13/2021. CT head 05/23/2021 noted atrophy with chronic small vessel ischemic changes. MOCA 21/30 in 2019.   Husband also reports episodes of increased tremors. Episodes last < 30 minutes. Unsure if related to sinemet schedule and medication wearing off. Sinemet was recently increased to q4 hrs with long acting at bedtime.   06/11 she  was found on the floor in her bathroom by husband. Hematoma to back of head. Vitals and neuro checks normal.   Treatment options discussed with patient and husband. They would like to have sleep study done and see neurology due to new symptoms.   Past Medical History:  Diagnosis Date   Anxiety    Arthritis    knee & back    Blood type, Rh negative    Breast cancer (HCC)    Chronic fatigue    Depression    Diverticulosis    Endocervical polyp    GERD (gastroesophageal reflux disease)    Hypertension    Hypothyroidism    IBS (irritable bowel syndrome)    Insomnia    Lymphedema    Neuromuscular disorder ([place])    Parkinson's disease    Osteopenia 06/2015   T score -2.4 distal third of the radius   Parkinson disease ([facility])    RBBB 10/07/2013   Thyroid cancer ([facility])    Past Surgical History:  Procedure Laterality Date   APPENDECTOMY  1979   BREAST SURGERY  1979   MASTECTOMY AND RECONSTRUCTION   DILATION AND CURETTAGE OF UTERUS     MISCARRIAGE   EYE SURGERY Bilateral    w/ IOL   HYSTEROSCOPY  2005,2009   AND D&C   MASTECTOMY Bilateral    THYROIDECTOMY     TONSILLECTOMY     TOTAL KNEE ARTHROPLASTY Right 10/07/2013   Procedure: RIGHT TOTAL KNEE ARTHROPLASTY;  Surgeon: Kerin Salen, MD;  Location:  [facility];  Service: Orthopedics;  Laterality: Right;   TUBAL LIGATION      Allergies  Allergen Reactions   Sulfa Antibiotics Rash   Biaxin [Clarithromycin] Other (See Comments)    Pt does not remember reaction    Ciprofloxacin Nausea And Vomiting   Hctz [Hydrochlorothiazide] Other (See Comments)    weakness and hyponatremia   Macrobid [Nitrofurantoin] Nausea Only   Other Nausea Only   Prednisone Other (See Comments)    Swelling and facial redness    Sulfamethoxazole-Trimethoprim Other (See Comments) and Rash    Sore throat    Outpatient Encounter Medications as of 07/07/2021  Medication Sig   acetaminophen (TYLENOL) 500 MG tablet Take 1,000 mg by mouth every 8 (eight)  hours as needed.   Ascorbic Acid (VITAMIN C PO) Take 1,000 mg by mouth daily.   aspirin EC 81 MG tablet Take 81 mg by mouth daily.   buPROPion (WELLBUTRIN XL) 150 MG 24 hr tablet Take 150 mg by mouth daily.   carbidopa-levodopa (SINEMET IR) 25-100 MG tablet Take 1 tablet by mouth 4 (four) times daily. 0800, 1100, 1500, 2000   Carbidopa-Levodopa ER (SINEMET CR) 25-100 MG tablet controlled release Take 1 tablet by mouth at bedtime.   cholecalciferol (VITAMIN D) 1000 units tablet Take 2,000 Units by mouth daily.   CRANBERRY-VITAMIN C-D MANNOSE PO Take 1 tablet by mouth 3 (three) times daily.   escitalopram (LEXAPRO) 20 MG tablet Take 20 mg by mouth at bedtime.   irbesartan (AVAPRO) 75 MG tablet Take 37.5 mg by mouth every morning.   levothyroxine (SYNTHROID, LEVOTHROID) 112 MCG tablet Take 112 mcg by mouth daily before breakfast.   Multiple Vitamin (MULTIVITAMIN) tablet Take 1 tablet by mouth daily.   NON FORMULARY estradiol vaginal cream; 0.2% (0.'2mg'$ /ml); amt: 1 mL; vaginal Special Instructions: Insert 1 mL into the vagina at bedtime 2 times weekly as directed. At Bedtime on Sun   polyethylene glycol (MIRALAX / GLYCOLAX) 17 g packet Take 17 g by mouth daily.   rivastigmine (EXELON) 9.5 mg/24hr APPLY 1 PATCH ONTO THE SKIN EVERY DAY   senna-docusate (SENOKOT-S) 8.6-50 MG tablet Take 1 tablet by mouth at bedtime.   No facility-administered encounter medications on file as of 07/07/2021.    Review of Systems  Constitutional:  Positive for fatigue. Negative for activity change, appetite change, chills and fever.  HENT:  Negative for congestion and trouble swallowing.   Eyes:  Negative for visual disturbance.  Respiratory:  Negative for cough, shortness of breath and wheezing.   Cardiovascular:  Negative for chest pain and leg swelling.  Gastrointestinal:  Negative for abdominal distention, abdominal pain, constipation, nausea and vomiting.  Genitourinary:  Positive for frequency. Negative for  dysuria, hematuria and urgency.  Musculoskeletal:  Positive for gait problem.  Skin:  Positive for wound.  Neurological:  Positive for tremors, speech difficulty and weakness. Negative for dizziness and headaches.  Psychiatric/Behavioral:  Positive for confusion, hallucinations and sleep disturbance. Negative for dysphoric mood. The patient is not nervous/anxious.     Immunization History  Administered Date(s) Administered   DTP 01/24/1994   Influenza Split 10/17/2008, 10/23/2009, 11/18/2011, 11/21/2012, 12/10/2019   Influenza Whole 10/09/2007   Influenza,inj,Quad PF,6+ Mos 10/26/2010   Influenza-Unspecified 11/05/2013, 10/31/2014, 10/12/2015, 10/20/2016, 11/15/2017, 11/13/2020   Moderna SARS-COV2 Booster Vaccination 11/28/2019, 05/28/2020, 11/06/2020   Moderna Sars-Covid-2 Vaccination 01/29/2019, 02/28/2019   Pneumococcal Conjugate-13 10/23/2014   Pneumococcal Polysaccharide-23 10/25/2002   Tdap 05/23/2021   Zoster, Live 10/13/2005   Zoster, Unspecified 10/13/2005  Pertinent  Health Maintenance Due  Topic Date Due   PAP SMEAR-Modifier  03/15/2013   INFLUENZA VACCINE  08/24/2021   DEXA SCAN  Completed      04/19/2021   10:11 AM 04/19/2021   11:00 PM 04/20/2021   12:00 PM 04/29/2021   12:25 PM 05/23/2021   12:01 PM  Fall Risk  Patient Fall Risk Level High fall risk High fall risk High fall risk Low fall risk Moderate fall risk   Functional Status Survey:    Vitals:   07/07/21 1441  BP: (!) 165/82  Pulse: 70  Resp: 18  Temp: (!) 97 F (36.1 C)  SpO2: 92%  Weight: 115 lb 6.4 oz (52.3 kg)  Height: '5\' 3"'$  (1.6 m)   Body mass index is 20.44 kg/m. Physical Exam Vitals reviewed.  Constitutional:      General: She is not in acute distress. HENT:     Head: Normocephalic.     Mouth/Throat:     Comments: Smile symmetrical Eyes:     General:        Right eye: No discharge.        Left eye: No discharge.     Extraocular Movements: Extraocular movements intact.     Right  eye: Normal extraocular motion and no nystagmus.     Left eye: Normal extraocular motion and no nystagmus.     Pupils: Pupils are equal, round, and reactive to light.  Cardiovascular:     Rate and Rhythm: Normal rate and regular rhythm.     Pulses: Normal pulses.     Heart sounds: Normal heart sounds.  Pulmonary:     Effort: Pulmonary effort is normal. No respiratory distress.     Breath sounds: Normal breath sounds. No wheezing.  Abdominal:     General: Bowel sounds are normal. There is no distension.     Palpations: Abdomen is soft.     Tenderness: There is no abdominal tenderness.  Musculoskeletal:     Cervical back: Neck supple.     Right lower leg: No edema.     Left lower leg: No edema.     Comments: Able to move all extremities  Skin:    General: Skin is warm and dry.     Capillary Refill: Capillary refill takes less than 2 seconds.  Neurological:     General: No focal deficit present.     Mental Status: She is alert. Mental status is at baseline.     Motor: Weakness present.     Gait: Gait abnormal.     Comments: Wheelchair, bilateral hand grips 5/5  Psychiatric:        Mood and Affect: Mood normal.        Speech: Speech is slurred.        Behavior: Behavior normal.        Cognition and Memory: Cognition is impaired. Memory is impaired.     Comments: Speech mildly slurred, follows commands, alert to self/person/place     Labs reviewed: Recent Labs    04/19/21 1010 04/20/21 0307 04/29/21 1343  NA 138 138 138  K 3.5 3.2* 3.9  CL 106 108 107  CO2 '25 25 26  '$ GLUCOSE 95 98 94  BUN 26* 24* 26*  CREATININE 0.88 0.78 0.87  CALCIUM 8.6* 8.5* 8.4*   Recent Labs    04/19/21 1010 04/29/21 1343  AST 15 18  ALT 9 17  ALKPHOS 59 59  BILITOT 1.2 0.6  PROT 6.1* 5.9*  ALBUMIN  3.4* 3.3*   Recent Labs    04/19/21 1010 04/20/21 0307 04/29/21 1343  WBC 5.4 6.4 6.0  NEUTROABS  --  4.5 4.1  HGB 12.7 12.3 12.3  HCT 38.7 36.3 37.8  MCV 96.5 94.0 96.2  PLT 201 202  194   Lab Results  Component Value Date   TSH 1.635 04/20/2021   Lab Results  Component Value Date   HGBA1C 5.6 10/15/2013   No results found for: "CHOL", "HDL", "LDLCALC", "LDLDIRECT", "TRIG", "CHOLHDL"  Significant Diagnostic Results in last 30 days:  No results found.  Assessment/Plan 1. Slurred speech - speech same as yesterday - suspect related to dryness/ parkinson's - recommend drinking fluids frequently  - cont [facility] protocol  2. Parkinson's disease (tremor, stiffness, slow motion, unstable posture) (HCC) - ongoing - followed by neurology - sinemet increased by neurology to q4hrs and long acting qhs - she is having periods of increased tremors - recommend seeing neurology- husband in agreement - cont skilled nursing care  3. Cognitive impairment - MMSE 22/30 06/13/2021, MOCA 21/30 in 2019 - CT head 05/23/2021 noted atrophy with chronic small vessel ischemic changes - noted to have odd behaviors like eating imaginary lunch - ? Parkinsons dementia versus sleep apnea?  - recommend having sleep study done  4. Hematoma of occipital surface of head, initial encounter - mechanical fall 06/11 - exam unremarkable, asymptomatic - vitals and neuro checks stable  5. Excessive daytime sleepiness - ongoing  - odd behaviors- see above - sleep study in 2019?- negative per Dr. Rexene Alberts - napping more per patient - reschedule sleep study    Family/ staff Communication: Plan discussed with husband and patient   Labs/tests ordered:  reschedule sleep study

## 2021-07-12 ENCOUNTER — Telehealth: Payer: Self-pay | Admitting: Adult Health

## 2021-07-12 NOTE — Telephone Encounter (Signed)
I got a message that Michelle Sims with [facility] last week to schedule the patient HST.. I called [facility] and they informed me that the patient husband has declined again for the patient to have the HST.

## 2021-07-15 ENCOUNTER — Non-Acute Institutional Stay (SKILLED_NURSING_FACILITY): Payer: Medicare Other | Admitting: Adult Health

## 2021-07-15 ENCOUNTER — Encounter: Payer: Self-pay | Admitting: Adult Health

## 2021-07-15 DIAGNOSIS — G20A1 Parkinson's disease without dyskinesia, without mention of fluctuations: Secondary | ICD-10-CM

## 2021-07-15 DIAGNOSIS — I10 Essential (primary) hypertension: Secondary | ICD-10-CM

## 2021-07-15 DIAGNOSIS — G20 Parkinson's disease: Secondary | ICD-10-CM

## 2021-07-15 NOTE — Progress Notes (Unsigned)
Location:  [facility] Room Number: 143/A Place of Service:  SNF [phone]) Provider: Royal Hawthorn, NP  Patient Care Team: Virgie Dad, MD as PCP - General (Internal Medicine) Belva Crome, MD as Consulting Physician (Cardiology)  Extended Emergency Contact Information Primary Emergency Contact: [name] Address: [address]          [place], [place] [address] [name] of [place] Phone: [phone] Work Phone: [phone] Mobile Phone: [phone] Relation: Spouse Secondary Emergency Contact: Hanley Ben , [place] of [place] Phone: [phone] Relation: Granddaughter  Code Status:  Full code Goals of care: Advanced Directive information    07/15/2021   11:23 AM  Advanced Directives  Does Patient Have a Medical Advance Directive? No  Does patient want to make changes to medical advance directive? No - Patient declined     Chief Complaint  Patient presents with   Acute Visit    HTN    HPI:  Pt is a 88 y.o. female seen today for an acute visit for    Past Medical History:  Diagnosis Date   Anxiety    Arthritis    knee & back    Blood type, Rh negative    Breast cancer (HCC)    Chronic fatigue    Depression    Diverticulosis    Endocervical polyp    GERD (gastroesophageal reflux disease)    Hypertension    Hypothyroidism    IBS (irritable bowel syndrome)    Insomnia    Lymphedema    Neuromuscular disorder ([facility])    Parkinson's disease    Osteopenia 06/2015   T score -2.4 distal third of the radius   Parkinson disease ([facility])    RBBB 10/07/2013   Thyroid cancer ([facility])    Past Surgical History:  Procedure Laterality Date   APPENDECTOMY  1979   BREAST SURGERY  1979   MASTECTOMY AND RECONSTRUCTION   DILATION AND CURETTAGE OF UTERUS     MISCARRIAGE   EYE SURGERY Bilateral    w/ IOL   HYSTEROSCOPY  2005,2009   AND D&C   MASTECTOMY Bilateral    THYROIDECTOMY      TONSILLECTOMY     TOTAL KNEE ARTHROPLASTY Right 10/07/2013   Procedure: RIGHT TOTAL KNEE ARTHROPLASTY;  Surgeon: Kerin Salen, MD;  Location: [facility];  Service: Orthopedics;  Laterality: Right;   TUBAL LIGATION      Allergies  Allergen Reactions   Sulfa Antibiotics Rash   Biaxin [Clarithromycin] Other (See Comments)    Pt does not remember reaction    Ciprofloxacin Nausea And Vomiting   Hctz [Hydrochlorothiazide] Other (See Comments)    weakness and hyponatremia   Macrobid [Nitrofurantoin] Nausea Only   Other Nausea Only   Prednisone Other (See Comments)    Swelling and facial redness    Sulfamethoxazole-Trimethoprim Other (See Comments) and Rash    Sore throat    Outpatient Encounter Medications as of 07/15/2021  Medication Sig   acetaminophen (TYLENOL) 500 MG tablet Take 1,000 mg by mouth every 8 (eight) hours as needed.   Ascorbic Acid (VITAMIN C PO) Take 1,000 mg by mouth daily.   aspirin EC 81 MG tablet Take 81 mg by mouth daily.   buPROPion (WELLBUTRIN XL) 150 MG 24 hr tablet Take 150 mg by mouth daily.   carbidopa-levodopa (SINEMET IR) 25-100 MG tablet Take 1 tablet by mouth 4 (four) times daily. 0800, 1100, 1500,  2000   Carbidopa-Levodopa ER (SINEMET CR) 25-100 MG tablet controlled release Take 1 tablet by mouth at bedtime.   cholecalciferol (VITAMIN D) 1000 units tablet Take 2,000 Units by mouth daily.   CRANBERRY-VITAMIN C-D MANNOSE PO Take 1 tablet by mouth 3 (three) times daily.   escitalopram (LEXAPRO) 20 MG tablet Take 20 mg by mouth at bedtime.   irbesartan (AVAPRO) 75 MG tablet Take 37.5 mg by mouth every morning.   levothyroxine (SYNTHROID, LEVOTHROID) 112 MCG tablet Take 112 mcg by mouth daily before breakfast.   Multiple Vitamin (MULTIVITAMIN) tablet Take 1 tablet by mouth daily.   NON FORMULARY estradiol vaginal cream; 0.2% (0.'2mg'$ /ml); amt: 1 mL; vaginal Special Instructions: Insert 1 mL into the vagina at bedtime 2 times weekly as directed. At Bedtime on  Sun   polyethylene glycol (MIRALAX / GLYCOLAX) 17 g packet Take 17 g by mouth daily.   rivastigmine (EXELON) 9.5 mg/24hr APPLY 1 PATCH ONTO THE SKIN EVERY DAY   senna-docusate (SENOKOT-S) 8.6-50 MG tablet Take 1 tablet by mouth at bedtime.   No facility-administered encounter medications on file as of 07/15/2021.    Review of Systems  Immunization History  Administered Date(s) Administered   DTP 01/24/1994   Influenza Split 10/17/2008, 10/23/2009, 11/18/2011, 11/21/2012, 12/10/2019   Influenza Whole 10/09/2007   Influenza,inj,Quad PF,6+ Mos 10/26/2010   Influenza-Unspecified 11/05/2013, 10/31/2014, 10/12/2015, 10/20/2016, 11/15/2017, 11/13/2020   Moderna SARS-COV2 Booster Vaccination 11/28/2019, 05/28/2020, 11/06/2020   Moderna Sars-Covid-2 Vaccination 01/29/2019, 02/28/2019   Pneumococcal Conjugate-13 10/23/2014   Pneumococcal Polysaccharide-23 10/25/2002   Tdap 05/23/2021   Zoster, Live 10/13/2005   Zoster, Unspecified 10/13/2005   Pertinent  Health Maintenance Due  Topic Date Due   PAP SMEAR-Modifier  03/15/2013   INFLUENZA VACCINE  08/24/2021   DEXA SCAN  Completed      04/19/2021   10:11 AM 04/19/2021   11:00 PM 04/20/2021   12:00 PM 04/29/2021   12:25 PM 05/23/2021   12:01 PM  Fall Risk  Patient Fall Risk Level High fall risk High fall risk High fall risk Low fall risk Moderate fall risk   Functional Status Survey:    Vitals:   07/15/21 1118  BP: (!) 159/85  Pulse: 69  Resp: 16  Temp: 97.7 F (36.5 C)  SpO2: 97%  Weight: 115 lb 6.4 oz (52.3 kg)  Height: '5\' 3"'$  (1.6 m)   Body mass index is 20.44 kg/m. Physical Exam  Labs reviewed: Recent Labs    04/19/21 1010 04/20/21 0307 04/29/21 1343  NA 138 138 138  K 3.5 3.2* 3.9  CL 106 108 107  CO2 '25 25 26  '$ GLUCOSE 95 98 94  BUN 26* 24* 26*  CREATININE 0.88 0.78 0.87  CALCIUM 8.6* 8.5* 8.4*   Recent Labs    04/19/21 1010 04/29/21 1343  AST 15 18  ALT 9 17  ALKPHOS 59 59  BILITOT 1.2 0.6  PROT 6.1*  5.9*  ALBUMIN 3.4* 3.3*   Recent Labs    04/19/21 1010 04/20/21 0307 04/29/21 1343  WBC 5.4 6.4 6.0  NEUTROABS  --  4.5 4.1  HGB 12.7 12.3 12.3  HCT 38.7 36.3 37.8  MCV 96.5 94.0 96.2  PLT 201 202 194   Lab Results  Component Value Date   TSH 1.635 04/20/2021   Lab Results  Component Value Date   HGBA1C 5.6 10/15/2013   No results found for: "CHOL", "HDL", "LDLCALC", "LDLDIRECT", "TRIG", "CHOLHDL"  Significant Diagnostic Results in last 30 days:  No results  found.  Assessment/Plan There are no diagnoses linked to this encounter.   Family/ staff Communication: ***  Labs/tests ordered:  ***

## 2021-07-16 ENCOUNTER — Encounter: Payer: Self-pay | Admitting: Adult Health

## 2021-07-26 LAB — CBC AND DIFFERENTIAL
HCT: 37 (ref 36–46)
Hemoglobin: 12.5 (ref 12.0–16.0)
Platelets: 190 10*3/uL (ref 150–400)
WBC: 6.5

## 2021-07-26 LAB — BASIC METABOLIC PANEL
BUN: 25 — AB (ref 4–21)
CO2: 24 — AB (ref 13–22)
Chloride: 105 (ref 99–108)
Creatinine: 0.9 (ref 0.5–1.1)
Glucose: 85
Potassium: 4.4 mEq/L (ref 3.5–5.1)
Sodium: 143 (ref 137–147)

## 2021-07-26 LAB — COMPREHENSIVE METABOLIC PANEL: Calcium: 8.8 (ref 8.7–10.7)

## 2021-07-26 LAB — CBC: RBC: 3.89 (ref 3.87–5.11)

## 2021-07-26 LAB — TSH: TSH: 4.13 (ref 0.41–5.90)

## 2021-08-18 ENCOUNTER — Encounter: Payer: Medicare Other | Admitting: Internal Medicine

## 2021-08-19 ENCOUNTER — Non-Acute Institutional Stay (SKILLED_NURSING_FACILITY): Payer: Medicare Other | Admitting: Adult Health

## 2021-08-19 ENCOUNTER — Encounter: Payer: Self-pay | Admitting: Adult Health

## 2021-08-19 DIAGNOSIS — E890 Postprocedural hypothyroidism: Secondary | ICD-10-CM

## 2021-08-19 DIAGNOSIS — M810 Age-related osteoporosis without current pathological fracture: Secondary | ICD-10-CM | POA: Diagnosis not present

## 2021-08-19 DIAGNOSIS — F3341 Major depressive disorder, recurrent, in partial remission: Secondary | ICD-10-CM

## 2021-08-19 DIAGNOSIS — R269 Unspecified abnormalities of gait and mobility: Secondary | ICD-10-CM

## 2021-08-19 DIAGNOSIS — G20 Parkinson's disease: Secondary | ICD-10-CM

## 2021-08-19 DIAGNOSIS — G20A1 Parkinson's disease without dyskinesia, without mention of fluctuations: Secondary | ICD-10-CM

## 2021-08-19 DIAGNOSIS — R490 Dysphonia: Secondary | ICD-10-CM

## 2021-08-19 DIAGNOSIS — N952 Postmenopausal atrophic vaginitis: Secondary | ICD-10-CM

## 2021-08-19 DIAGNOSIS — I10 Essential (primary) hypertension: Secondary | ICD-10-CM

## 2021-08-19 NOTE — Progress Notes (Signed)
Location:  [facility] of Service:  SNF (31) Provider:   Cindi Carbon, [place] [phone]   Virgie Dad, MD  Patient Care Team: Virgie Dad, MD as PCP - General (Internal Medicine) Belva Crome, MD as Consulting Physician (Cardiology)  Extended Emergency Contact Information Primary Emergency Contact: [facility] Address: [address]          [place], [place] [address] [name] of [place] Phone: [phone] Work Phone: [phone] Mobile Phone: [phone] Relation: Spouse Secondary Emergency Contact: Hanley Ben , [place] of [place] Phone: [phone] Relation: Granddaughter  Code Status:  Full Goals of care: Advanced Directive information    07/15/2021   11:23 AM  Advanced Directives  Does Patient Have a Medical Advance Directive? No  Does patient want to make changes to medical advance directive? No - Patient declined     Chief Complaint  Patient presents with   Medical Management of Chronic Issues    HPI:  Pt is a 88 y.o. female seen today for medical management of chronic diseases.    PD and related cognitive issues: Now requiring skilled care. Has periods of lethargy, increased tremor and confusion.  Has declined HST for sleep apnea screening.  MMSE 22/30 06/13/2021. CT head 05/23/2021 noted atrophy with chronic small vessel ischemic changes.  Pt complains of worsening hoarseness and dysphonia.   Hx of bilateral mastectomy with breast reconstruction due to breast ca. No longer needs mammograms.   Osteoporosis: T score -2.8 07/23/20, previous on reclast per Dr Buddy Duty in 2022.   S/p thyroidectomy due to thyroid ca TSH 4.13 07/26/21, changed to generic synthroid. Serial TSH ordered due to change from brand name to generic.   Working with Cornelia regarding cognition, swallowing, and speech  SBP has been elevated at times in the 149-170 range.  Currently on losartan  On regular diet. Weight is maintained Wt Readings from Last 3 Encounters:  08/19/21 116 lb 3.2 oz (52.7 kg)  07/15/21 115 lb 6.4 oz (52.3 kg)  07/07/21 115 lb 6.4 oz (52.3 kg)    Past Medical History:  Diagnosis Date   Anxiety    Arthritis    knee & back    Blood type, Rh negative    Breast cancer (HCC)    Chronic fatigue    Depression    Diverticulosis    Endocervical polyp    GERD (gastroesophageal reflux disease)    Hypertension    Hypothyroidism    IBS (irritable bowel syndrome)    Insomnia    Lymphedema    Neuromuscular disorder ([place])    Parkinson's disease    Osteopenia 06/2015   T score -2.4 distal third of the radius   Parkinson disease ([place])    RBBB 10/07/2013   Thyroid cancer ([place])    Past Surgical History:  Procedure Laterality Date   APPENDECTOMY  1979   BREAST SURGERY  1979   MASTECTOMY AND RECONSTRUCTION   DILATION AND CURETTAGE OF UTERUS     MISCARRIAGE   EYE SURGERY Bilateral    w/ IOL   HYSTEROSCOPY  2005,2009   AND D&C   MASTECTOMY Bilateral    THYROIDECTOMY     TONSILLECTOMY     TOTAL KNEE ARTHROPLASTY Right 10/07/2013   Procedure: RIGHT TOTAL KNEE ARTHROPLASTY;  Surgeon: Kerin Salen, MD;  Location: [facility];  Service: Orthopedics;  Laterality: Right;   TUBAL LIGATION  Allergies  Allergen Reactions   Sulfa Antibiotics Rash   Biaxin [Clarithromycin] Other (See Comments)    Pt does not remember reaction    Ciprofloxacin Nausea And Vomiting   Hctz [Hydrochlorothiazide] Other (See Comments)    weakness and hyponatremia   Macrobid [Nitrofurantoin] Nausea Only   Other Nausea Only   Prednisone Other (See Comments)    Swelling and facial redness    Sulfamethoxazole-Trimethoprim Other (See Comments) and Rash    Sore throat    Outpatient Encounter Medications as of 08/19/2021  Medication Sig   acetaminophen (TYLENOL) 500 MG tablet Take 1,000 mg by mouth every 8 (eight) hours as needed.   Ascorbic Acid (VITAMIN C  PO) Take 1,000 mg by mouth daily.   aspirin EC 81 MG tablet Take 81 mg by mouth daily.   buPROPion (WELLBUTRIN XL) 150 MG 24 hr tablet Take 150 mg by mouth daily.   carbidopa-levodopa (SINEMET IR) 25-100 MG tablet Take 1 tablet by mouth 4 (four) times daily. 0800, 1100, 1500, 2000   Carbidopa-Levodopa ER (SINEMET CR) 25-100 MG tablet controlled release Take 1 tablet by mouth at bedtime.   cholecalciferol (VITAMIN D) 1000 units tablet Take 2,000 Units by mouth daily.   CRANBERRY-VITAMIN C-D MANNOSE PO Take 1 tablet by mouth 3 (three) times daily.   escitalopram (LEXAPRO) 20 MG tablet Take 20 mg by mouth at bedtime.   irbesartan (AVAPRO) 75 MG tablet Take 37.5 mg by mouth every morning.   levothyroxine (SYNTHROID, LEVOTHROID) 112 MCG tablet Take 112 mcg by mouth daily before breakfast.   Multiple Vitamin (MULTIVITAMIN) tablet Take 1 tablet by mouth daily.   NON FORMULARY estradiol vaginal cream; 0.2% (0.'2mg'$ /ml); amt: 1 mL; vaginal Special Instructions: Insert 1 mL into the vagina at bedtime 2 times weekly as directed. At Bedtime on Sun   polyethylene glycol (MIRALAX / GLYCOLAX) 17 g packet Take 17 g by mouth daily.   rivastigmine (EXELON) 9.5 mg/24hr APPLY 1 PATCH ONTO THE SKIN EVERY DAY   senna-docusate (SENOKOT-S) 8.6-50 MG tablet Take 1 tablet by mouth at bedtime.   No facility-administered encounter medications on file as of 08/19/2021.    Review of Systems  Constitutional:  Positive for activity change. Negative for appetite change, chills, diaphoresis, fatigue, fever and unexpected weight change.  HENT:  Positive for trouble swallowing (intermittent) and voice change. Negative for congestion.   Respiratory:  Negative for cough, shortness of breath and wheezing.   Cardiovascular:  Negative for chest pain, palpitations and leg swelling.  Gastrointestinal:  Negative for abdominal distention, abdominal pain, constipation and diarrhea.  Genitourinary:  Negative for difficulty urinating and  dysuria.  Musculoskeletal:  Positive for gait problem. Negative for arthralgias, back pain, joint swelling and myalgias.  Neurological:  Positive for tremors and speech difficulty. Negative for dizziness, seizures, syncope, facial asymmetry, weakness, light-headedness, numbness and headaches.       Rigidity freezing slow movement.   Psychiatric/Behavioral:  Positive for confusion. Negative for agitation and behavioral problems.     Immunization History  Administered Date(s) Administered   DTP 01/24/1994   Influenza Split 10/17/2008, 10/23/2009, 11/18/2011, 11/21/2012, 12/10/2019   Influenza Whole 10/09/2007   Influenza,inj,Quad PF,6+ Mos 10/26/2010   Influenza-Unspecified 11/05/2013, 10/31/2014, 10/12/2015, 10/20/2016, 11/15/2017, 11/13/2020   Moderna SARS-COV2 Booster Vaccination 11/28/2019, 05/28/2020, 11/06/2020   Moderna Sars-Covid-2 Vaccination 01/29/2019, 02/28/2019   Pneumococcal Conjugate-13 10/23/2014   Pneumococcal Polysaccharide-23 10/25/2002   Tdap 05/23/2021   Zoster, Live 10/13/2005   Zoster, Unspecified 10/13/2005   Pertinent  Health Maintenance  Due  Topic Date Due   PAP SMEAR-Modifier  03/15/2013   INFLUENZA VACCINE  08/24/2021   DEXA SCAN  Completed      04/19/2021   10:11 AM 04/19/2021   11:00 PM 04/20/2021   12:00 PM 04/29/2021   12:25 PM 05/23/2021   12:01 PM  Fall Risk  Patient Fall Risk Level High fall risk High fall risk High fall risk Low fall risk Moderate fall risk   Functional Status Survey:    Vitals:   08/19/21 1016  BP: (!) 165/93  Pulse: 69  Resp: 19  Temp: (!) 97.2 F (36.2 C)  SpO2: 95%  Weight: 116 lb 3.2 oz (52.7 kg)   Body mass index is 20.58 kg/m. Physical Exam Vitals and nursing note reviewed.  Constitutional:      General: She is not in acute distress.    Appearance: She is not diaphoretic.  HENT:     Head: Normocephalic and atraumatic.     Mouth/Throat:     Mouth: Mucous membranes are moist.     Pharynx: Oropharynx is  clear.  Neck:     Vascular: No JVD.  Cardiovascular:     Rate and Rhythm: Normal rate and regular rhythm.     Heart sounds: No murmur heard. Pulmonary:     Effort: Pulmonary effort is normal. No respiratory distress.     Breath sounds: Normal breath sounds. No wheezing.  Abdominal:     General: Bowel sounds are normal. There is no distension.     Palpations: Abdomen is soft.     Tenderness: There is no abdominal tenderness.  Musculoskeletal:     Cervical back: No rigidity or tenderness.     Right lower leg: No edema.     Left lower leg: No edema.  Lymphadenopathy:     Cervical: No cervical adenopathy.  Skin:    General: Skin is warm and dry.  Neurological:     Mental Status: She is alert.     Comments: Oriented to self and place. Able to f/c.  Has slow movements, tremor, and rigidity to BUE and BLE. Dysphonia.   Psychiatric:        Mood and Affect: Mood normal.     Labs reviewed: Recent Labs    04/19/21 1010 04/20/21 0307 04/29/21 1343 07/26/21 0000  NA 138 138 138 143  K 3.5 3.2* 3.9 4.4  CL 106 108 107 105  CO2 '25 25 26 '$ 24*  GLUCOSE 95 98 94  --   BUN 26* 24* 26* 25*  CREATININE 0.88 0.78 0.87 0.9  CALCIUM 8.6* 8.5* 8.4* 8.8   Recent Labs    04/19/21 1010 04/29/21 1343  AST 15 18  ALT 9 17  ALKPHOS 59 59  BILITOT 1.2 0.6  PROT 6.1* 5.9*  ALBUMIN 3.4* 3.3*   Recent Labs    04/19/21 1010 04/20/21 0307 04/29/21 1343 07/26/21 0000  WBC 5.4 6.4 6.0 6.5  NEUTROABS  --  4.5 4.1  --   HGB 12.7 12.3 12.3 12.5  HCT 38.7 36.3 37.8 37  MCV 96.5 94.0 96.2  --   PLT 201 202 194 190   Lab Results  Component Value Date   TSH 4.13 07/26/2021   Lab Results  Component Value Date   HGBA1C 5.6 10/15/2013   No results found for: "CHOL", "HDL", "LDLCALC", "LDLDIRECT", "TRIG", "CHOLHDL"  Significant Diagnostic Results in last 30 days:  No results found.  Assessment/Plan  1. Age-related osteoporosis without current pathological fracture Previously  on  reclast.  Minimal weight bearing Continue supplements.   Now that she is in skilled care additional treatment would likely not be covered by insurance with unclear benefit.   2. Essential hypertension Increase irbesartan to 75 Monitor BP daily Rise slowly  3. Hypothyroidism, postsurgical history of thyroid cancer Changing to generic synthroid Monitor TSH  4. Parkinson's disease (tremor, stiffness, slow motion, unstable posture) (HCC) Followed by neurology Worsening over time.   5. Postmenopause atrophic vaginitis On estradiol cream  No current issues .  6. Recurrent major depressive disorder, in partial remission ([place]) Not currently depressed Does have some feelings of anxiety Would not taper meds.   7. Abnormality of gait Ambulate each day with walker and assistance Freezing due to PD  8. Dysphonia Due to PD May have some reflux Discussed reflux diet, can consider PPI in the future.     Family/ staff Communication: discussed with her husband at bedside.   Labs/tests ordered:  BMP with next TSH

## 2021-09-09 ENCOUNTER — Non-Acute Institutional Stay (SKILLED_NURSING_FACILITY): Payer: Medicare Other | Admitting: Adult Health

## 2021-09-09 ENCOUNTER — Encounter: Payer: Self-pay | Admitting: Adult Health

## 2021-09-09 DIAGNOSIS — N3000 Acute cystitis without hematuria: Secondary | ICD-10-CM

## 2021-09-09 DIAGNOSIS — G20 Parkinson's disease: Secondary | ICD-10-CM | POA: Diagnosis not present

## 2021-09-09 DIAGNOSIS — R531 Weakness: Secondary | ICD-10-CM | POA: Diagnosis not present

## 2021-09-09 DIAGNOSIS — R4189 Other symptoms and signs involving cognitive functions and awareness: Secondary | ICD-10-CM

## 2021-09-09 LAB — BASIC METABOLIC PANEL
BUN: 22 — AB (ref 4–21)
CO2: 30 — AB (ref 13–22)
Chloride: 102 (ref 99–108)
Creatinine: 0.9 (ref 0.5–1.1)
Glucose: 82
Potassium: 3.9 mEq/L (ref 3.5–5.1)
Sodium: 138 (ref 137–147)

## 2021-09-09 LAB — CBC AND DIFFERENTIAL
HCT: 36 (ref 36–46)
Hemoglobin: 12.2 (ref 12.0–16.0)
Neutrophils Absolute: 4.1
Platelets: 186 10*3/uL (ref 150–400)
WBC: 5.8

## 2021-09-09 LAB — HEPATIC FUNCTION PANEL
ALT: 8 U/L (ref 7–35)
AST: 16 (ref 13–35)
Alkaline Phosphatase: 78 (ref 25–125)
Bilirubin, Total: 0.6

## 2021-09-09 LAB — CBC: RBC: 3.9 (ref 3.87–5.11)

## 2021-09-09 LAB — COMPREHENSIVE METABOLIC PANEL
Albumin: 3.7 (ref 3.5–5.0)
Calcium: 8.5 — AB (ref 8.7–10.7)
eGFR: 60

## 2021-09-09 NOTE — Progress Notes (Signed)
Location:  [facility] Room Number: [facility] of Service:  SNF [phone]) Provider:  Earney Hamburg, MD  Patient Care Team: Virgie Dad, MD as PCP - General (Internal Medicine) Belva Crome, MD as Consulting Physician (Cardiology)  Extended Emergency Contact Information Primary Emergency Contact: [name] Address: [address]          [place], [place] [address] [name] of [place] Phone: [phone] Work Phone: [phone] Mobile Phone: [phone] Relation: Spouse Secondary Emergency Contact: Hanley Ben , [place] of [place] Phone: [phone] Relation: Granddaughter  Code Status:  Full Goals of care: Advanced Directive information    07/15/2021   11:23 AM  Advanced Directives  Does Patient Have a Medical Advance Directive? No  Does patient want to make changes to medical advance directive? No - Patient declined     Chief Complaint  Patient presents with   Acute Visit    Difficulty Standing    HPI:  Pt is a 88 y.o. female seen today for an acute visit for difficulty standing.  She has a hx of progressive PD. Her husband and her nurse report she is having difficulty standing where as she used to walk with a walker and assistance. Also she is having difficulty feeding her self. Also she is sleeping more. Recently her husband asked to have her exelon patch discontinued due to cost and lack of benefit.   She also continues to have dyskinetic movements and periods of lethargy which are long standing but concerning to her husband. She has an apt with neurology.  Past Medical History:  Diagnosis Date   Anxiety    Arthritis    knee & back    Blood type, Rh negative    Breast cancer (HCC)    Chronic fatigue    Depression    Diverticulosis    Endocervical polyp    GERD (gastroesophageal reflux disease)    Hypertension    Hypothyroidism    IBS (irritable  bowel syndrome)    Insomnia    Lymphedema    Neuromuscular disorder ([place])    Parkinson's disease    Osteopenia 06/2015   T score -2.4 distal third of the radius   Parkinson disease ([place])    RBBB 10/07/2013   Thyroid cancer ([place])    Past Surgical History:  Procedure Laterality Date   APPENDECTOMY  1979   BREAST SURGERY  1979   MASTECTOMY AND RECONSTRUCTION   DILATION AND CURETTAGE OF UTERUS     MISCARRIAGE   EYE SURGERY Bilateral    w/ IOL   HYSTEROSCOPY  2005,2009   AND D&C   MASTECTOMY Bilateral    THYROIDECTOMY     TONSILLECTOMY     TOTAL KNEE ARTHROPLASTY Right 10/07/2013   Procedure: RIGHT TOTAL KNEE ARTHROPLASTY;  Surgeon: Kerin Salen, MD;  Location: [facility];  Service: Orthopedics;  Laterality: Right;   TUBAL LIGATION      Allergies  Allergen Reactions   Sulfa Antibiotics Rash   Biaxin [Clarithromycin] Other (See Comments)    Pt does not remember reaction    Ciprofloxacin Nausea And Vomiting   Hctz [Hydrochlorothiazide] Other (See Comments)    weakness and hyponatremia   Macrobid [Nitrofurantoin] Nausea Only   Other Nausea Only   Prednisone Other (See Comments)    Swelling and facial redness    Sulfamethoxazole-Trimethoprim Other (See Comments) and Rash    Sore  throat    Outpatient Encounter Medications as of 09/09/2021  Medication Sig   acetaminophen (TYLENOL) 500 MG tablet Take 1,000 mg by mouth every 8 (eight) hours as needed.   Ascorbic Acid (VITAMIN C PO) Take 1,000 mg by mouth daily.   aspirin EC 81 MG tablet Take 81 mg by mouth daily.   buPROPion (WELLBUTRIN XL) 150 MG 24 hr tablet Take 150 mg by mouth daily.   carbidopa-levodopa (SINEMET IR) 25-100 MG tablet Take 1 tablet by mouth 4 (four) times daily. 0800, 1100, 1500, 2000   Carbidopa-Levodopa ER (SINEMET CR) 25-100 MG tablet controlled release Take 1 tablet by mouth at bedtime.   cholecalciferol (VITAMIN D) 1000 units tablet Take 2,000 Units by mouth daily.   CRANBERRY-VITAMIN C-D MANNOSE PO Take  1 tablet by mouth 3 (three) times daily.   escitalopram (LEXAPRO) 20 MG tablet Take 20 mg by mouth at bedtime.   irbesartan (AVAPRO) 75 MG tablet Take 75 mg by mouth every morning.   levothyroxine (SYNTHROID, LEVOTHROID) 112 MCG tablet Take 112 mcg by mouth daily before breakfast.   Multiple Vitamin (MULTIVITAMIN) tablet Take 1 tablet by mouth daily.   polyethylene glycol (MIRALAX / GLYCOLAX) 17 g packet Take 17 g by mouth daily.   rivastigmine (EXELON) 9.5 mg/24hr APPLY 1 PATCH ONTO THE SKIN EVERY DAY   senna-docusate (SENOKOT-S) 8.6-50 MG tablet Take 1 tablet by mouth at bedtime.   NON FORMULARY estradiol vaginal cream; 0.2% (0.'2mg'$ /ml); amt: 1 mL; vaginal Special Instructions: Insert 1 mL into the vagina at bedtime 2 times weekly as directed. At Bedtime on Sun (Patient not taking: Reported on 09/09/2021)   No facility-administered encounter medications on file as of 09/09/2021.    Review of Systems  Unable to perform ROS: Dementia    Immunization History  Administered Date(s) Administered   DTP 01/24/1994   Influenza Split 10/17/2008, 10/23/2009, 11/18/2011, 11/21/2012, 12/10/2019   Influenza Whole 10/09/2007   Influenza,inj,Quad PF,6+ Mos 10/26/2010   Influenza-Unspecified 11/05/2013, 10/31/2014, 10/12/2015, 10/20/2016, 11/15/2017, 11/13/2020   Moderna SARS-COV2 Booster Vaccination 11/28/2019, 05/28/2020, 11/06/2020   Moderna Sars-Covid-2 Vaccination 01/29/2019, 02/28/2019   Pneumococcal Conjugate-13 10/23/2014   Pneumococcal Polysaccharide-23 10/25/2002   Tdap 05/23/2021   Zoster, Live 10/13/2005   Zoster, Unspecified 10/13/2005   Pertinent  Health Maintenance Due  Topic Date Due   PAP SMEAR-Modifier  03/15/2013   INFLUENZA VACCINE  08/24/2021   DEXA SCAN  Completed      04/19/2021   10:11 AM 04/19/2021   11:00 PM 04/20/2021   12:00 PM 04/29/2021   12:25 PM 05/23/2021   12:01 PM  Fall Risk  Patient Fall Risk Level High fall risk High fall risk High fall risk Low fall risk  Moderate fall risk   Functional Status Survey:    Vitals:   09/09/21 1052  BP: (!) 169/63  Pulse: 69  Resp: 17  Temp: (!) 97.5 F (36.4 C)  TempSrc: Skin  SpO2: 96%  Weight: 113 lb 12.8 oz (51.6 kg)  Height: '5\' 3"'$  (1.6 m)   Body mass index is 20.16 kg/m. Physical Exam Vitals and nursing note reviewed.  Constitutional:      General: She is not in acute distress.    Appearance: She is not diaphoretic.  HENT:     Head: Normocephalic and atraumatic.     Mouth/Throat:     Mouth: Mucous membranes are moist.     Pharynx: Oropharynx is clear.  Neck:     Vascular: No JVD.  Cardiovascular:  Rate and Rhythm: Normal rate and regular rhythm.     Heart sounds: No murmur heard. Pulmonary:     Effort: Pulmonary effort is normal. No respiratory distress.     Breath sounds: Normal breath sounds. No wheezing.  Abdominal:     General: Abdomen is flat. Bowel sounds are normal. There is no distension.     Palpations: Abdomen is soft.     Tenderness: There is no abdominal tenderness.  Musculoskeletal:     Right lower leg: No edema.     Left lower leg: No edema.  Skin:    General: Skin is warm and dry.  Neurological:     General: No focal deficit present.     Mental Status: She is alert. Mental status is at baseline.     Cranial Nerves: No cranial nerve deficit.     Comments: Dyskinesia, slow movements, rigidity  Psychiatric:        Mood and Affect: Mood normal.     Labs reviewed: Recent Labs    04/19/21 1010 04/20/21 0307 04/29/21 1343 07/26/21 0000  NA 138 138 138 143  K 3.5 3.2* 3.9 4.4  CL 106 108 107 105  CO2 '25 25 26 '$ 24*  GLUCOSE 95 98 94  --   BUN 26* 24* 26* 25*  CREATININE 0.88 0.78 0.87 0.9  CALCIUM 8.6* 8.5* 8.4* 8.8   Recent Labs    04/19/21 1010 04/29/21 1343  AST 15 18  ALT 9 17  ALKPHOS 59 59  BILITOT 1.2 0.6  PROT 6.1* 5.9*  ALBUMIN 3.4* 3.3*   Recent Labs    04/19/21 1010 04/20/21 0307 04/29/21 1343 07/26/21 0000  WBC 5.4 6.4 6.0  6.5  NEUTROABS  --  4.5 4.1  --   HGB 12.7 12.3 12.3 12.5  HCT 38.7 36.3 37.8 37  MCV 96.5 94.0 96.2  --   PLT 201 202 194 190   Lab Results  Component Value Date   TSH 4.13 07/26/2021   Lab Results  Component Value Date   HGBA1C 5.6 10/15/2013   No results found for: "CHOL", "HDL", "LDLCALC", "LDLDIRECT", "TRIG", "CHOLHDL"  Significant Diagnostic Results in last 30 days:  No results found.  Assessment/Plan  1. Acute cystitis without hematuria Bacteria TNTC, WBC 183 with 2+ nitrite on UA  Will start keflex 500 mg tid x 7 days  2. Generalized weakness Likely due to #1 but also has progressive PD and recently off exelon  Labs reassuring.   3. Parkinson's disease (tremor, stiffness, slow motion, unstable posture) (HCC) Progressive on sinemet Followed by neurology   4. Cognitive impairment Off Exelon, following up with neurology    Family/ staff Communication: Mr Croslin   Labs/tests ordered:  CBC BMP UA C and S

## 2021-09-10 ENCOUNTER — Encounter: Payer: Self-pay | Admitting: Adult Health

## 2021-09-10 MED ORDER — CEPHALEXIN 500 MG PO CAPS: 500.0000 mg | ORAL_CAPSULE | Freq: Three times a day (TID) | ORAL | 0 refills | Status: AC

## 2021-09-13 ENCOUNTER — Encounter: Payer: Self-pay | Admitting: Internal Medicine

## 2021-09-13 ENCOUNTER — Non-Acute Institutional Stay (SKILLED_NURSING_FACILITY): Payer: Medicare Other | Admitting: Internal Medicine

## 2021-09-13 DIAGNOSIS — R4781 Slurred speech: Secondary | ICD-10-CM | POA: Diagnosis not present

## 2021-09-13 DIAGNOSIS — R259 Unspecified abnormal involuntary movements: Secondary | ICD-10-CM | POA: Diagnosis not present

## 2021-09-13 DIAGNOSIS — N3000 Acute cystitis without hematuria: Secondary | ICD-10-CM

## 2021-09-13 DIAGNOSIS — G20 Parkinson's disease: Secondary | ICD-10-CM

## 2021-09-13 LAB — BASIC METABOLIC PANEL
BUN: 25 — AB (ref 4–21)
CO2: 30 — AB (ref 13–22)
Chloride: 101 (ref 99–108)
Creatinine: 0.7 (ref 0.5–1.1)
Glucose: 67
Potassium: 3.7 mEq/L (ref 3.5–5.1)
Sodium: 138 (ref 137–147)

## 2021-09-13 LAB — HEPATIC FUNCTION PANEL
ALT: 8 U/L (ref 7–35)
AST: 17 (ref 13–35)
Alkaline Phosphatase: 77 (ref 25–125)
Bilirubin, Total: 0.9

## 2021-09-13 LAB — CBC AND DIFFERENTIAL
HCT: 37 (ref 36–46)
Hemoglobin: 12.5 (ref 12.0–16.0)
Platelets: 212 10*3/uL (ref 150–400)
WBC: 6.5

## 2021-09-13 LAB — CBC: RBC: 3.97 (ref 3.87–5.11)

## 2021-09-13 LAB — COMPREHENSIVE METABOLIC PANEL
Albumin: 3.9 (ref 3.5–5.0)
Calcium: 9 (ref 8.7–10.7)
eGFR: 83

## 2021-09-13 NOTE — Progress Notes (Signed)
Location:  [facility] Room Number: 130A Place of Service:  SNF [phone]) Provider:  Dr. Clydene Fake, MD  Patient Care Team: Virgie Dad, MD as PCP - General (Internal Medicine) Belva Crome, MD as Consulting Physician (Cardiology)  Extended Emergency Contact Information Primary Emergency Contact: [name] Address: [address]          [place], [place] [address] [name] of [place] Phone: [phone] Work Phone: [phone] Mobile Phone: [phone] Relation: Spouse Secondary Emergency Contact: Hanley Ben , [place] of [place] Phone: [phone] Relation: Granddaughter  Code Status:  DNR Goals of care: Advanced Directive information    07/15/2021   11:23 AM  Advanced Directives  Does Patient Have a Medical Advance Directive? No  Does patient want to make changes to medical advance directive? No - Patient declined     Chief Complaint  Patient presents with   Acute Visit    Acute Visit    HPI:  Pt is a 88 y.o. female seen today for an acute visit for Change in Mental status , Jerking and Slurred speech  Lives in SNF She has h/o  Patient has sees Dr. Rexene Alberts for her Parkinson.  Has seen [name] before.  Has seen Dr. Mervyn Skeeters at [facility] Patient has had extensive work-up  h/o cognitive impairment  Hypertension Hypothyroidism Depression Urinary In continence Osteoporosis   Patient has had h/o Dyskinetic movement and slurred speech in 04/23 with MRI and EEG which was negative and it usually it gets resolved But this time it is worse and has been going on since this morning She keeps moving her arms and legs involuntarily Also keeps picking her hands in the air like she is seeing something/Hallucinating Her speech is Slurred She cannot stand up and Unable to feed herself as her hands are involuntarily moving Could not answer basic orientation questions Moving  all extremities Would follow some commands She did get diagnosed with UTI and is on Macrodantin for that No Fever Cough or fall Past Medical History:  Diagnosis Date   Anxiety    Arthritis    knee & back    Blood type, Rh negative    Breast cancer (HCC)    Chronic fatigue    Depression    Diverticulosis    Endocervical polyp    GERD (gastroesophageal reflux disease)    Hypertension    Hypothyroidism    IBS (irritable bowel syndrome)    Insomnia    Lymphedema    Neuromuscular disorder ([place])    Parkinson's disease    Osteopenia 06/2015   T score -2.4 distal third of the radius   Parkinson disease ([place])    RBBB 10/07/2013   Thyroid cancer ([place])    Past Surgical History:  Procedure Laterality Date   APPENDECTOMY  1979   BREAST SURGERY  1979   MASTECTOMY AND RECONSTRUCTION   DILATION AND CURETTAGE OF UTERUS     MISCARRIAGE   EYE SURGERY Bilateral    w/ IOL   HYSTEROSCOPY  2005,2009   AND D&C   MASTECTOMY Bilateral    THYROIDECTOMY     TONSILLECTOMY     TOTAL KNEE ARTHROPLASTY Right 10/07/2013   Procedure: RIGHT TOTAL KNEE ARTHROPLASTY;  Surgeon: Kerin Salen, MD;  Location: [facility];  Service: Orthopedics;  Laterality: Right;   TUBAL LIGATION      Allergies  Allergen Reactions  Sulfa Antibiotics Rash   Biaxin [Clarithromycin] Other (See Comments)    Pt does not remember reaction    Ciprofloxacin Nausea And Vomiting   Hctz [Hydrochlorothiazide] Other (See Comments)    weakness and hyponatremia   Macrobid [Nitrofurantoin] Nausea Only   Other Nausea Only   Prednisone Other (See Comments)    Swelling and facial redness    Zofran [Ondansetron]    Sulfamethoxazole-Trimethoprim Other (See Comments) and Rash    Sore throat    Outpatient Encounter Medications as of 09/13/2021  Medication Sig   acetaminophen (TYLENOL) 500 MG tablet Take 1,000 mg by mouth every 8 (eight) hours as needed.   Ascorbic Acid (VITAMIN C PO) Take 1,000 mg by mouth daily.   aspirin EC 81 MG  tablet Take 81 mg by mouth daily.   buPROPion (WELLBUTRIN XL) 150 MG 24 hr tablet Take 150 mg by mouth daily.   carbidopa-levodopa (SINEMET IR) 25-100 MG tablet Take 1 tablet by mouth 4 (four) times daily. 0800, 1100, 1500, 2000   Carbidopa-Levodopa ER (SINEMET CR) 25-100 MG tablet controlled release Take 1 tablet by mouth at bedtime.   cholecalciferol (VITAMIN D) 1000 units tablet Take 2,000 Units by mouth daily.   CRANBERRY-VITAMIN C-D MANNOSE PO Take 1 tablet by mouth 3 (three) times daily.   escitalopram (LEXAPRO) 20 MG tablet Take 20 mg by mouth at bedtime.   irbesartan (AVAPRO) 75 MG tablet Take 75 mg by mouth every morning.   levothyroxine (SYNTHROID, LEVOTHROID) 112 MCG tablet Take 112 mcg by mouth daily before breakfast.   Multiple Vitamin (MULTIVITAMIN) tablet Take 1 tablet by mouth daily.   nitrofurantoin (MACRODANTIN) 100 MG capsule Take 100 mg by mouth 2 (two) times daily. Until 09/18/2021   polyethylene glycol (MIRALAX / GLYCOLAX) 17 g packet Take 17 g by mouth daily.   senna-docusate (SENOKOT-S) 8.6-50 MG tablet Take 1 tablet by mouth at bedtime.   cephALEXin (KEFLEX) 500 MG capsule Take 1 capsule (500 mg total) by mouth 3 (three) times daily for 7 days. (Patient not taking: Reported on 09/13/2021)   NON FORMULARY estradiol vaginal cream; 0.2% (0.'2mg'$ /ml); amt: 1 mL; vaginal Special Instructions: Insert 1 mL into the vagina at bedtime 2 times weekly as directed. At Bedtime on Sun (Patient not taking: Reported on 09/09/2021)   rivastigmine (EXELON) 9.5 mg/24hr APPLY 1 PATCH ONTO THE SKIN EVERY DAY (Patient not taking: Reported on 09/13/2021)   No facility-administered encounter medications on file as of 09/13/2021.    Review of Systems  Constitutional:  Positive for activity change. Negative for appetite change.  HENT: Negative.    Respiratory:  Negative for cough and shortness of breath.   Cardiovascular:  Negative for leg swelling.  Gastrointestinal:  Negative for constipation.   Genitourinary: Negative.   Musculoskeletal:  Positive for gait problem. Negative for arthralgias and myalgias.  Skin: Negative.   Neurological:  Positive for weakness. Negative for dizziness.  Psychiatric/Behavioral:  Positive for confusion. Negative for dysphoric mood and sleep disturbance.     Immunization History  Administered Date(s) Administered   DTP 01/24/1994   Influenza Split 10/17/2008, 10/23/2009, 11/18/2011, 11/21/2012, 12/10/2019   Influenza Whole 10/09/2007   Influenza,inj,Quad PF,6+ Mos 10/26/2010   Influenza-Unspecified 11/05/2013, 10/31/2014, 10/12/2015, 10/20/2016, 11/15/2017, 11/13/2020   Moderna SARS-COV2 Booster Vaccination 11/28/2019, 05/28/2020, 11/06/2020   Moderna Sars-Covid-2 Vaccination 01/29/2019, 02/28/2019   Pneumococcal Conjugate-13 10/23/2014   Pneumococcal Polysaccharide-23 10/25/2002   Tdap 05/23/2021   Zoster, Live 10/13/2005   Zoster, Unspecified 10/13/2005   Pertinent  Health  Maintenance Due  Topic Date Due   PAP SMEAR-Modifier  03/15/2013   INFLUENZA VACCINE  08/24/2021   DEXA SCAN  Completed      04/19/2021   10:11 AM 04/19/2021   11:00 PM 04/20/2021   12:00 PM 04/29/2021   12:25 PM 05/23/2021   12:01 PM  Fall Risk  Patient Fall Risk Level High fall risk High fall risk High fall risk Low fall risk Moderate fall risk   Functional Status Survey:    Vitals:   09/13/21 1118  BP: (!) 146/97  Pulse: (!) 56  Resp: 18  Temp: (!) 97 F (36.1 C)  TempSrc: Skin  SpO2: 95%  Weight: 113 lb 12.8 oz (51.6 kg)  Height: '5\' 3"'$  (1.6 m)   Body mass index is 20.16 kg/m. Physical Exam Vitals reviewed.  HENT:     Head: Normocephalic.     Nose: Nose normal.     Mouth/Throat:     Mouth: Mucous membranes are moist.     Pharynx: Oropharynx is clear.  Eyes:     Pupils: Pupils are equal, round, and reactive to light.  Cardiovascular:     Rate and Rhythm: Normal rate and regular rhythm.     Pulses: Normal pulses.     Heart sounds: Normal heart  sounds. No murmur heard. Pulmonary:     Effort: Pulmonary effort is normal.     Breath sounds: Normal breath sounds.  Abdominal:     General: Abdomen is flat. Bowel sounds are normal.     Palpations: Abdomen is soft.  Musculoskeletal:        General: No swelling.     Cervical back: Neck supple.  Skin:    General: Skin is warm.  Neurological:     Mental Status: She is alert.     Comments: Dyskinetic movements of Both Upper and Lower Extremities Speech is Slurred Able to follow some commands Mostly equal strength bilateral  Psychiatric:        Mood and Affect: Mood normal.        Thought Content: Thought content normal.     Labs reviewed: Recent Labs    04/19/21 1010 04/20/21 0307 04/29/21 1343 07/26/21 0000  NA 138 138 138 143  K 3.5 3.2* 3.9 4.4  CL 106 108 107 105  CO2 '25 25 26 '$ 24*  GLUCOSE 95 98 94  --   BUN 26* 24* 26* 25*  CREATININE 0.88 0.78 0.87 0.9  CALCIUM 8.6* 8.5* 8.4* 8.8   Recent Labs    04/19/21 1010 04/29/21 1343  AST 15 18  ALT 9 17  ALKPHOS 59 59  BILITOT 1.2 0.6  PROT 6.1* 5.9*  ALBUMIN 3.4* 3.3*   Recent Labs    04/19/21 1010 04/20/21 0307 04/29/21 1343 07/26/21 0000  WBC 5.4 6.4 6.0 6.5  NEUTROABS  --  4.5 4.1  --   HGB 12.7 12.3 12.3 12.5  HCT 38.7 36.3 37.8 37  MCV 96.5 94.0 96.2  --   PLT 201 202 194 190   Lab Results  Component Value Date   TSH 4.13 07/26/2021   Lab Results  Component Value Date   HGBA1C 5.6 10/15/2013   No results found for: "CHOL", "HDL", "LDLCALC", "LDLDIRECT", "TRIG", "CHOLHDL"  Significant Diagnostic Results in last 30 days:  No results found.  Assessment/Plan 1. Abnormal movements Discussed with Husband and Son Refused ED eval Will Call Neurology to see if they can see her early CBC CMP stat done today Also will  Hold Wellbutrin and Lexapro for now   2. Slurred speech Needs Neurology Eval MRI in 04/23 was negative Her speech is worse today  3. Acute cystitis without hematuria On  Macrodantin  4. Parkinson's disease (tremor, stiffness, slow motion, unstable posture) (HCC) On Sinemet 5 goals of care D/w Husband She is now DNR Possible Palliative if she continues her decline    Family/ staff Communication: Husband And Son  Labs/tests ordered:  CBC,CMP Stat

## 2021-09-14 ENCOUNTER — Telehealth: Payer: Self-pay | Admitting: Adult Health

## 2021-09-14 NOTE — Telephone Encounter (Signed)
I called son, Waunita Schooner (whom was calling for father who is overwhelmed).  Pt has really declined.  She has had increased uncontrolled jerking (sporadic but all the time (daily).  It jerks her whole body totally.  She has seen  Dr. Lyndel Safe (at [facility] ) see notes in Epic for review.  Labs done; ok.  She was taken off wellbutrin,lexapro thinking this may cause issue.  Still continues.  Son asking if increase in sinemet might be helpful, a antianxiety med?  They have meeting this Friday to address hospice (seems eminent per son).  They had appt this Thursday with [place], husband to come.  Asking if anything could be done prior to that.  I relayed that I will ask and get back with them.  She is on Sinemet 25/100 1 tab po qid,  sinemet 25/100 CR at bedtime.

## 2021-09-14 NOTE — Telephone Encounter (Signed)
Reviewed note from nursing home if she is now having new onset slurred speech confusion she needs evaluation in ED.

## 2021-09-14 NOTE — Telephone Encounter (Signed)
I called son back and relayed the message response back to him from MM/NP after she reviewed Dr. Steve Rattler note and recommended evaluation at the ED (due to new onset confusion/slurred speech ).  He stated that appreciates this, but wants to keep appt for Thursday at scheduled. We will see a big change since last in.  He would relay this to his father.  Did not cancel the appt.

## 2021-09-14 NOTE — Telephone Encounter (Signed)
Pt son is calling and requesting a nurse give him a call about increasing Pt medication

## 2021-09-15 ENCOUNTER — Encounter: Payer: Self-pay | Admitting: Internal Medicine

## 2021-09-16 ENCOUNTER — Ambulatory Visit (INDEPENDENT_AMBULATORY_CARE_PROVIDER_SITE_OTHER): Payer: Medicare Other | Admitting: Adult Health

## 2021-09-16 ENCOUNTER — Encounter: Payer: Self-pay | Admitting: Adult Health

## 2021-09-16 VITALS — BP 82/61 | HR 76

## 2021-09-16 DIAGNOSIS — R413 Other amnesia: Secondary | ICD-10-CM

## 2021-09-16 DIAGNOSIS — R251 Tremor, unspecified: Secondary | ICD-10-CM | POA: Diagnosis not present

## 2021-09-16 DIAGNOSIS — G20 Parkinson's disease: Secondary | ICD-10-CM

## 2021-09-16 NOTE — Progress Notes (Signed)
PATIENT: Michelle Sims DOB: 1929/06/14  REASON FOR VISIT: follow up HISTORY FROM: patient  Chief Complaint  Patient presents with   Follow-up    Pt in 62  with husband and son  Son states patient was having tremors . Husband states tremors were so bad she could have fell out her chair . Son states that pt had UTI  Pt  PCP took her off of  Wellbutrin and lexapro  and tremors  subsided . Husband states increased memory loss      HISTORY OF PRESENT ILLNESS: Today 09/16/21:  Mrs. Michelle Sims is a 88 year old female with a history of Parkinson's disease.  She returns today with her husband and son.  Son reports that tremors was worse earlier in the week.  Reports that there are a lot of jerking in the upper extremities and lower extremities.  Reports that she had UTI and that was treated and she was taken off wellbutrin and lexapro and now tremors are better. This occur in the last 1-2 days. In skilled nursing at [facility]. Husband feels that memory is worse since last seen. Requires assistance with ADLs.  Remains on Sinemet IR 25-100 mg 4 times a day and Sinemet CR 25-100 mg at bedtime.  Family will be talking to hospice tomorrow.  05/26/21:Ms. Michelle Sims is a 88 year old female with a history of parkinsonism.  She returns today for follow-up.  The patient did go to the hospital with altered mental status.  Her husband reports that he was having a hard time waking her up.  He typically tries to wake her up around 8 AM to take her Synthroid and Sinemet.  Patient typically goes to bed around 8:30 PM.  Work-up at the hospital was relatively unremarkable.  Dr. Leonel Ramsay sent a staff message inquiring about evaluation for CO2 retention in the sleep lab.  Overall they feel that her mobility has declined.  She Needs assistance with ADLS. Uses a WC primarily. They Have decided that she is going to move to skilled nursing due to mobility and cognition.  Her facility is recommending rehab first.  She  continues to have difficulty waking up.  Husband states that if he does not try to wake her up she typically gets up around 11 AM.  But she does sleep a lot during the day.  Patient had a fall 'Sunday and hit her head- went to ED had to have sutures.     11'$ /14/22: Ms. Michelle Sims is an 88 year old female with a history of parkinsonism.  She returns today for follow-up.  Overall the patient feels that her symptoms have progressed.  She has noticed more trouble with her walking.  Specifically when she stands she has a hard time getting started.  Feels that her tremor in the legs have gotten worse.  Denies any trouble swallowing food or liquids.  Reports that she continues to have trouble sleeping.  This has been an ongoing issue.  Denies any changes in her mood or behavior.  States that her PCP started her on Lexapro 10 mg daily.  Husband has noticed the benefit.  She did neurologist at [facility] in June for second opinion.  They suggested that she increased Sinemet to 1-1/2 tablets for the first 2 doses and continue 1 tablet for the last 2 doses.  The patient and her husband has not noticed the benefit.  She also notes trouble with her memory.  Reports that she is forgetful.  She returns today for an evaluation.  05/26/20: Ms. Michelle Sims is an 88 year old female with a history of parkinsonism.  She returns today for follow-up.  She reports that her tremor is only on occasion.  She uses a walker when ambulating.  Denies any recent falls.  Reports that her gait is slow but no other changes.  Reports good appetite.  States that she naps a lot during the day subsequently does not sleep well at night.  She tried increasing Exelon to 13.3 mg but this caused nausea.  She is now back on 9.5 mg.  She continues Sinemet 1 tablet 4 times a day and Sinemet CR at bedtime.  The patient does feel that she is depressed.  Reports most days she has no desire to do activities.  She stays in bed a lot.  Has not discussed her depression  with her PCP.  She has a second opinion regarding her parkinsonism June 1 at [facility].  Patient's husband states that they are not working to change practices just want a second opinion.  HISTORY 02/26/2020: She reports feeling more off balance, she has fallen, thankfully without major injuries, she typically falls when she does not use her walker.  She has a 2 wheeled walker and does not always use it inside the house.  She has ongoing issues with fatigue.  Her husband is concerned about more forgetfulness.  He is wondering if she can increase the Exelon patch.  She is currently on 9.5 mg daily and is able to tolerate this.  Appetite is good, he tries to encourage her to drink more water.  She may not always do well enough.  She does struggle with constipation from time to time and uses MiraLAX as needed.  She also tries to eat prunes for more natural approach to constipation management.  She has been followed by Dr. Mervyn Skeeters at [facility] in [place].  She also had a consultation via telemedicine with Dr. Ida Rogue for cognitive evaluation and I reviewed the telemedicine visit note from 02/20/2020.   REVIEW OF SYSTEMS: Out of a complete 14 system review of symptoms, the patient complains only of the following symptoms, and all other reviewed systems are negative.  ALLERGIES: Allergies  Allergen Reactions   Sulfa Antibiotics Rash   Biaxin [Clarithromycin] Other (See Comments)    Pt does not remember reaction    Ciprofloxacin Nausea And Vomiting   Hctz [Hydrochlorothiazide] Other (See Comments)    weakness and hyponatremia   Macrobid [Nitrofurantoin] Nausea Only   Other Nausea Only   Prednisone Other (See Comments)    Swelling and facial redness    Zofran [Ondansetron]    Sulfamethoxazole-Trimethoprim Other (See Comments) and Rash    Sore throat    HOME MEDICATIONS: Outpatient Medications Prior to Visit  Medication Sig Dispense Refill   acetaminophen (TYLENOL) 500 MG tablet Take 1,000 mg by  mouth every 8 (eight) hours as needed.     Ascorbic Acid (VITAMIN C PO) Take 1,000 mg by mouth daily.     aspirin EC 81 MG tablet Take 81 mg by mouth daily.     carbidopa-levodopa (SINEMET IR) 25-100 MG tablet Take 1 tablet by mouth 4 (four) times daily. 0800, 1100, 1500, 2000     Carbidopa-Levodopa ER (SINEMET CR) 25-100 MG tablet controlled release Take 1 tablet by mouth at bedtime. 90 tablet 3   cholecalciferol (VITAMIN D) 1000 units tablet Take 2,000 Units by mouth daily.     CRANBERRY-VITAMIN C-D MANNOSE PO Take 1 tablet by mouth 3 (three)  times daily.     irbesartan (AVAPRO) 75 MG tablet Take 75 mg by mouth every morning.     levothyroxine (SYNTHROID, LEVOTHROID) 112 MCG tablet Take 112 mcg by mouth daily before breakfast.     Multiple Vitamin (MULTIVITAMIN) tablet Take 1 tablet by mouth daily.     nitrofurantoin (MACRODANTIN) 100 MG capsule Take 100 mg by mouth 2 (two) times daily. Until 09/18/2021     NON FORMULARY estradiol vaginal cream; 0.2% (0.'2mg'$ /ml); amt: 1 mL; vaginal Special Instructions: Insert 1 mL into the vagina at bedtime 2 times weekly as directed. At Bedtime on Sun     polyethylene glycol (MIRALAX / GLYCOLAX) 17 g packet Take 17 g by mouth daily. 14 each 0   rivastigmine (EXELON) 9.5 mg/24hr APPLY 1 PATCH ONTO THE SKIN EVERY DAY 90 patch 0   senna-docusate (SENOKOT-S) 8.6-50 MG tablet Take 1 tablet by mouth at bedtime.     buPROPion (WELLBUTRIN XL) 150 MG 24 hr tablet Take 150 mg by mouth daily. (Patient not taking: Reported on 09/16/2021)     cephALEXin (KEFLEX) 500 MG capsule Take 1 capsule (500 mg total) by mouth 3 (three) times daily for 7 days. (Patient not taking: Reported on 09/16/2021) 21 capsule 0   escitalopram (LEXAPRO) 20 MG tablet Take 20 mg by mouth at bedtime. (Patient not taking: Reported on 09/16/2021)     No facility-administered medications prior to visit.    PAST MEDICAL HISTORY: Past Medical History:  Diagnosis Date   Anxiety    Arthritis    knee  & back    Blood type, Rh negative    Breast cancer (HCC)    Chronic fatigue    Depression    Diverticulosis    Endocervical polyp    GERD (gastroesophageal reflux disease)    Hypertension    Hypothyroidism    IBS (irritable bowel syndrome)    Insomnia    Lymphedema    Neuromuscular disorder ([place])    Parkinson's disease    Osteopenia 06/2015   T score -2.4 distal third of the radius   Parkinson disease (HCC)    RBBB 10/07/2013   Thyroid cancer ([place])     PAST SURGICAL HISTORY: Past Surgical History:  Procedure Laterality Date   APPENDECTOMY  1979   BREAST SURGERY  1979   MASTECTOMY AND RECONSTRUCTION   DILATION AND CURETTAGE OF UTERUS     MISCARRIAGE   EYE SURGERY Bilateral    w/ IOL   HYSTEROSCOPY  2005,2009   AND D&C   MASTECTOMY Bilateral    THYROIDECTOMY     TONSILLECTOMY     TOTAL KNEE ARTHROPLASTY Right 10/07/2013   Procedure: RIGHT TOTAL KNEE ARTHROPLASTY;  Surgeon: Kerin Salen, MD;  Location: [facility];  Service: Orthopedics;  Laterality: Right;   TUBAL LIGATION      FAMILY HISTORY: Family History  Problem Relation Age of Onset   Hypertension Mother    Hyperlipidemia Mother    Heart failure Mother    Hypertension Father    Hyperlipidemia Father    Heart failure Father    Hyperlipidemia Brother    Hypertension Brother    Heart attack Brother    Breast cancer Neg Hx    Parkinson's disease Neg Hx     SOCIAL HISTORY: Social History   Socioeconomic History   Marital status: Married    Spouse name: Not on file   Number of children: Not on file   Years of education: Not on file  Highest education level: Not on file  Occupational History   Occupation: retired    Comment: "odd jobs"  Tobacco Use   Smoking status: Never   Smokeless tobacco: Never  Vaping Use   Vaping Use: Never used  Substance and Sexual Activity   Alcohol use: Yes    Alcohol/week: 0.0 standard drinks of alcohol    Comment: rare   Drug use: No   Sexual activity: Never    Birth  control/protection: Surgical, Post-menopausal    Comment: 1st intercourse 88 yo-Fewer than 5 partners  Other Topics Concern   Not on file  Social History Narrative   Not on file   Social Determinants of Health   Financial Resource Strain: Not on file  Food Insecurity: Not on file  Transportation Needs: Not on file  Physical Activity: Not on file  Stress: Not on file  Social Connections: Not on file  Intimate Partner Violence: Not on file      PHYSICAL EXAM  Vitals:   09/16/21 0946  BP: (!) 82/61  Pulse: 76    There is no height or weight on file to calculate BMI.     05/26/2021    1:27 PM 12/07/2020    1:51 PM 06/01/2020   11:36 AM  MMSE - Mini Mental State Exam  Orientation to time '4 2 5  '$ Orientation to Place '5 5 5  '$ Registration '3 3 3  '$ Attention/ Calculation '1 5 5  '$ Recall '3 2 2  '$ Language- name 2 objects '1 2 2  '$ Language- repeat '1 1 1  '$ Language- follow 3 step command '3 2 3  '$ Language- read & follow direction '1 1 1  '$ Write a sentence 1 0 0  Copy design 0 0 0  Total score '23 23 27     '$ Generalized: Well developed, in no acute distress   Neurological examination  Mentation: Alert oriented to time, place,. Follows all commands speech and language fluent Cranial nerve II-XII: Pupils were equal round reactive to light. Extraocular movements were full, visual field were full on confrontational test. Facial sensation and strength were normal. Uvula tongue midline. Head turning and shoulder shrug  were normal and symmetric.  Masking of face Motor: The motor testing reveals 4 over 5 strength in the upper extremities.  3/5 strength in the lower extremities Sensory: Sensory testing is intact to soft touch on all 4 extremities. No evidence of extinction is noted.  Coordination: Unable to complete Gait and station:Patient in a wheelchair today.   DIAGNOSTIC DATA (LABS, IMAGING, TESTING) - I reviewed patient records, labs, notes, testing and imaging myself where  available.  Lab Results  Component Value Date   WBC 6.5 09/13/2021   HGB 12.5 09/13/2021   HCT 37 09/13/2021   MCV 96.2 04/29/2021   PLT 212 09/13/2021      Component Value Date/Time   NA 138 09/13/2021 0000   K 3.7 09/13/2021 0000   CL 101 09/13/2021 0000   CO2 30 (A) 09/13/2021 0000   GLUCOSE 94 04/29/2021 1343   BUN 25 (A) 09/13/2021 0000   CREATININE 0.7 09/13/2021 0000   CREATININE 0.87 04/29/2021 1343   CALCIUM 9.0 09/13/2021 0000   PROT 5.9 (L) 04/29/2021 1343   ALBUMIN 3.9 09/13/2021 0000   AST 17 09/13/2021 0000   ALT 8 09/13/2021 0000   ALKPHOS 77 09/13/2021 0000   BILITOT 0.6 04/29/2021 1343   GFRNONAA >60 04/29/2021 1343   GFRAA >60 09/03/2019 1515      ASSESSMENT  AND PLAN 88 y.o. year old female  has a past medical history of Anxiety, Arthritis, Blood type, Rh negative, Breast cancer ([place]), Chronic fatigue, Depression, Diverticulosis, Endocervical polyp, GERD (gastroesophageal reflux disease), Hypertension, Hypothyroidism, IBS (irritable bowel syndrome), Insomnia, Lymphedema, Neuromuscular disorder ([place]), Osteopenia (06/2015), Parkinson disease ([place]), RBBB (10/07/2013), and Thyroid cancer ([place]). here with:  1.  Primary parkinsonism  --Continue Sinemet 25-100 mg 4 times a day.   -- Continue Sinemet CR 25-100 mg at bedtime -- Tremors currently better however we discussed any additional work-up.  Tremors that they are describing does not seem consistent with Parkinson's but rather myoclonic jerking? Discussed possible EEG or imaging however they do not want to proceed with any further work-up at this time  2.  Memory disturbance  --Continue Exelon patch 9.5  mg every 24 hours   Follow-up in 6 months or sooner if needed Advised if symptoms worsen or she develops new symptoms she should let us know.     Ward Givens, MSN, NP-C 09/16/2021, 10:14 AM [facility] [address] [place], [place] [address] [phone]   I  reviewed the above note and documentation by the Nurse Practitioner and agree with the history, exam, assessment and plan as outlined above. I was available for consultation. Star Age, MD, PhD [facility])

## 2021-09-23 ENCOUNTER — Ambulatory Visit: Payer: Medicare Other | Admitting: Adult Health

## 2021-10-08 ENCOUNTER — Non-Acute Institutional Stay (SKILLED_NURSING_FACILITY): Payer: Medicare Other | Admitting: Adult Health

## 2021-10-08 ENCOUNTER — Encounter: Payer: Self-pay | Admitting: Adult Health

## 2021-10-08 DIAGNOSIS — S9032XA Contusion of left foot, initial encounter: Secondary | ICD-10-CM

## 2021-10-08 DIAGNOSIS — F418 Other specified anxiety disorders: Secondary | ICD-10-CM

## 2021-10-08 DIAGNOSIS — E890 Postprocedural hypothyroidism: Secondary | ICD-10-CM

## 2021-10-08 DIAGNOSIS — G20 Parkinson's disease: Secondary | ICD-10-CM

## 2021-10-08 DIAGNOSIS — Z66 Do not resuscitate: Secondary | ICD-10-CM

## 2021-10-08 DIAGNOSIS — I10 Essential (primary) hypertension: Secondary | ICD-10-CM | POA: Diagnosis not present

## 2021-10-08 DIAGNOSIS — G9340 Encephalopathy, unspecified: Secondary | ICD-10-CM

## 2021-10-08 MED ORDER — CLONAZEPAM 0.5 MG PO TABS: 0.2500 mg | ORAL_TABLET | Freq: Every morning | ORAL | 1 refills | Status: DC

## 2021-10-08 NOTE — Progress Notes (Signed)
Location:  Occupational psychologist of Service:  SNF (31) Provider:   Cindi Carbon, [place] [phone]   Virgie Dad, MD  Patient Care Team: Virgie Dad, MD as PCP - General (Internal Medicine) Belva Crome, MD as Consulting Physician (Cardiology)  Extended Emergency Contact Information Primary Emergency Contact: [facility] Address: [address]          [place], [place] [address] [name] of [place] Phone: [phone] Work Phone: [phone] Mobile Phone: [phone] Relation: Spouse Secondary Emergency Contact: Hanley Ben , [place] of [place] Phone: [phone] Relation: Granddaughter  Code Status:  DNR Goals of care: Advanced Directive information    10/08/2021    9:44 AM  Advanced Directives  Does Patient Have a Medical Advance Directive? Yes  Type of Paramedic of [place];Living will;Out of facility DNR (pink MOST or yellow form)  Copy of [place] in Chart? Yes - validated most recent copy scanned in chart (See row information)     Chief Complaint  Patient presents with   Medical Management of Chronic Issues    HPI:  Pt is a 88 y.o. female seen today for medical management of chronic diseases.    She is now enrolled in hospice due to PD, dementia, and functional decline.  She has anxiety and depression. Off wellbutrin and lexapro for possible s/e of myoclonic type jerking which seems to have improved. Continues with PD symptoms of tremor at baseline. Also has slow movement, rigidity, and dysphonia. Staff using the hoyer lift for transfers.   Husband declined HST for sleep apnea screening.   MMSE 22/30 06/13/2021. CT head 05/23/2021 noted atrophy with chronic small vessel ischemic changes. Off exelon due to cost and lack of benefit.   Her husband is questioning starting something else for anxiety which seems  worse off lexapro and wellbutrin. She is much more alert and continues with tremor due to PD but the more coarse tremor /jerking symptom has resolved.  She has gained 5 lbs in the past month. Does have some edema in her left foot with bruising. Unclear etiology. This area was xrayed and did not show an acute fracture.  Wt Readings from Last 3 Encounters:  10/08/21 118 lb 6.4 oz (53.7 kg)  09/13/21 113 lb 12.8 oz (51.6 kg)  09/09/21 113 lb 12.8 oz (51.6 kg)    SBP 130-150s on avapro  S/p thyroidectomy due to thyroid ca, on synthroid.  Had changed to generic med, now monitoring tsh,  repeat due to 9/25 Lab Results  Component Value Date   TSH 4.13 07/26/2021   Hx of bilateral mastectomy with breast reconstruction due to breast ca. No longer needs mammograms.    Osteoporosis: T score -2.8 07/23/20, previous on reclast per Dr Buddy Duty in 2022.  Past Medical History:  Diagnosis Date   Anxiety    Arthritis    knee & back    Blood type, Rh negative    Breast cancer (HCC)    Chronic fatigue    Depression    Diverticulosis    Endocervical polyp    GERD (gastroesophageal reflux disease)    Hypertension    Hypothyroidism    IBS (irritable bowel syndrome)    Insomnia    Lymphedema    Neuromuscular disorder ([place])    Parkinson's disease    Osteopenia 06/2015   T score -2.4 distal third  of the radius   Parkinson disease ([place])    RBBB 10/07/2013   Thyroid cancer [facility])    Past Surgical History:  Procedure Laterality Date   APPENDECTOMY  1979   BREAST SURGERY  1979   MASTECTOMY AND RECONSTRUCTION   DILATION AND CURETTAGE OF UTERUS     MISCARRIAGE   EYE SURGERY Bilateral    w/ IOL   HYSTEROSCOPY  2005,2009   AND D&C   MASTECTOMY Bilateral    THYROIDECTOMY     TONSILLECTOMY     TOTAL KNEE ARTHROPLASTY Right 10/07/2013   Procedure: RIGHT TOTAL KNEE ARTHROPLASTY;  Surgeon: Kerin Salen, MD;  Location: [place];  Service: Orthopedics;  Laterality: Right;   TUBAL LIGATION      Allergies   Allergen Reactions   Sulfa Antibiotics Rash   Biaxin [Clarithromycin] Other (See Comments)    Pt does not remember reaction    Ciprofloxacin Nausea And Vomiting   Hctz [Hydrochlorothiazide] Other (See Comments)    weakness and hyponatremia   Macrobid [Nitrofurantoin] Nausea Only   Other Nausea Only   Prednisone Other (See Comments)    Swelling and facial redness    Zofran [Ondansetron]    Sulfamethoxazole-Trimethoprim Other (See Comments) and Rash    Sore throat    Outpatient Encounter Medications as of 10/08/2021  Medication Sig   acetaminophen (TYLENOL) 500 MG tablet Take 1,000 mg by mouth every 8 (eight) hours as needed.   Ascorbic Acid (VITAMIN C PO) Take 1,000 mg by mouth daily.   aspirin EC 81 MG tablet Take 81 mg by mouth daily.   carbidopa-levodopa (SINEMET IR) 25-100 MG tablet Take 1 tablet by mouth 4 (four) times daily. 0800, 1100, 1500, 2000   Carbidopa-Levodopa ER (SINEMET CR) 25-100 MG tablet controlled release Take 1 tablet by mouth at bedtime.   CRANBERRY-VITAMIN C-D MANNOSE PO Take 1 tablet by mouth 3 (three) times daily.   irbesartan (AVAPRO) 75 MG tablet Take 75 mg by mouth every morning.   levothyroxine (SYNTHROID, LEVOTHROID) 112 MCG tablet Take 112 mcg by mouth daily before breakfast.   Multiple Vitamin (MULTIVITAMIN) tablet Take 1 tablet by mouth daily.   NON FORMULARY estradiol vaginal cream; 0.2% (0.'2mg'$ /ml); amt: 1 mL; vaginal Special Instructions: Insert 1 mL into the vagina at bedtime 2 times weekly as directed. At Bedtime on Sun   polyethylene glycol (MIRALAX / GLYCOLAX) 17 g packet Take 17 g by mouth daily.   senna-docusate (SENOKOT-S) 8.6-50 MG tablet Take 1 tablet by mouth at bedtime.   [DISCONTINUED] buPROPion (WELLBUTRIN XL) 150 MG 24 hr tablet Take 150 mg by mouth daily. (Patient not taking: Reported on 09/16/2021)   [DISCONTINUED] cholecalciferol (VITAMIN D) 1000 units tablet Take 2,000 Units by mouth daily.   [DISCONTINUED] escitalopram  (LEXAPRO) 20 MG tablet Take 20 mg by mouth at bedtime. (Patient not taking: Reported on 09/16/2021)   [DISCONTINUED] nitrofurantoin (MACRODANTIN) 100 MG capsule Take 100 mg by mouth 2 (two) times daily. Until 09/18/2021   [DISCONTINUED] rivastigmine (EXELON) 9.5 mg/24hr APPLY 1 PATCH ONTO THE SKIN EVERY DAY   No facility-administered encounter medications on file as of 10/08/2021.    Review of Systems  Constitutional:  Negative for activity change, appetite change, chills, diaphoresis, fatigue, fever and unexpected weight change.  HENT:  Negative for congestion.   Respiratory:  Negative for cough, shortness of breath and wheezing.   Cardiovascular:  Positive for leg swelling. Negative for chest pain and palpitations.  Gastrointestinal:  Positive for constipation. Negative for abdominal  distention, abdominal pain and diarrhea.  Genitourinary:  Negative for difficulty urinating and dysuria.  Musculoskeletal:  Positive for gait problem. Negative for arthralgias, back pain, joint swelling and myalgias.  Neurological:  Positive for tremors, speech difficulty and weakness. Negative for dizziness, seizures, syncope, facial asymmetry, light-headedness, numbness and headaches.  Psychiatric/Behavioral:  Positive for agitation and confusion. The patient is nervous/anxious.     Immunization History  Administered Date(s) Administered   DTP 01/24/1994   Influenza Split 10/17/2008, 10/23/2009, 11/18/2011, 11/21/2012, 12/10/2019   Influenza Whole 10/09/2007   Influenza,inj,Quad PF,6+ Mos 10/26/2010   Influenza-Unspecified 11/05/2013, 10/31/2014, 10/12/2015, 10/20/2016, 11/15/2017, 11/13/2020   Moderna SARS-COV2 Booster Vaccination 11/28/2019, 05/28/2020, 11/06/2020   Moderna Sars-Covid-2 Vaccination 01/29/2019, 02/28/2019   Pneumococcal Conjugate-13 10/23/2014   Pneumococcal Polysaccharide-23 10/25/2002   Tdap 05/23/2021   Zoster, Live 10/13/2005   Zoster, Unspecified 10/13/2005   Pertinent  Health  Maintenance Due  Topic Date Due   PAP SMEAR-Modifier  03/15/2013   INFLUENZA VACCINE  08/24/2021   DEXA SCAN  Completed      04/19/2021   10:11 AM 04/19/2021   11:00 PM 04/20/2021   12:00 PM 04/29/2021   12:25 PM 05/23/2021   12:01 PM  Fall Risk  Patient Fall Risk Level High fall risk High fall risk High fall risk Low fall risk Moderate fall risk   Functional Status Survey:    Vitals:   10/08/21 0939  Weight: 118 lb 6.4 oz (53.7 kg)   Body mass index is 20.97 kg/m. Wt Readings from Last 3 Encounters:  10/08/21 118 lb 6.4 oz (53.7 kg)  09/13/21 113 lb 12.8 oz (51.6 kg)  09/09/21 113 lb 12.8 oz (51.6 kg)    Physical Exam Vitals and nursing note reviewed.  Constitutional:      General: She is not in acute distress.    Appearance: She is not diaphoretic.  HENT:     Head: Normocephalic and atraumatic.     Mouth/Throat:     Mouth: Mucous membranes are moist.     Pharynx: Oropharynx is clear.  Eyes:     Pupils: Pupils are equal, round, and reactive to light.  Neck:     Vascular: No JVD.  Cardiovascular:     Rate and Rhythm: Normal rate and regular rhythm.     Heart sounds: No murmur heard. Pulmonary:     Effort: Pulmonary effort is normal. No respiratory distress.     Breath sounds: Normal breath sounds. No wheezing.  Abdominal:     General: Bowel sounds are normal. There is no distension.     Palpations: Abdomen is soft.     Tenderness: There is no abdominal tenderness.  Musculoskeletal:     Comments: Left foot with swelling and bruising. No pain with range of motion to the ankle or toes.   Skin:    General: Skin is warm and dry.  Neurological:     General: No focal deficit present.     Mental Status: She is alert. Mental status is at baseline.  Psychiatric:        Mood and Affect: Mood normal.     Labs reviewed: Recent Labs    04/19/21 1010 04/20/21 0307 04/29/21 1343 07/26/21 0000 09/09/21 0000 09/13/21 0000  NA 138 138 138 143 138 138  K 3.5 3.2*  3.9 4.4 3.9 3.7  CL 106 108 107 105 102 101  CO2 '25 25 26 '$ 24* 30* 30*  GLUCOSE 95 98 94  --   --   --  BUN 26* 24* 26* 25* 22* 25*  CREATININE 0.88 0.78 0.87 0.9 0.9 0.7  CALCIUM 8.6* 8.5* 8.4* 8.8 8.5* 9.0   Recent Labs    04/19/21 1010 04/29/21 1343 09/09/21 0000 09/13/21 0000  AST '15 18 16 17  '$ ALT '9 17 8 8  '$ ALKPHOS 59 59 78 77  BILITOT 1.2 0.6  --   --   PROT 6.1* 5.9*  --   --   ALBUMIN 3.4* 3.3* 3.7 3.9   Recent Labs    04/19/21 1010 04/20/21 0307 04/29/21 1343 07/26/21 0000 09/09/21 0000 09/13/21 0000  WBC 5.4 6.4 6.0 6.5 5.8 6.5  NEUTROABS  --  4.5 4.1  --  4.10  --   HGB 12.7 12.3 12.3 12.5 12.2 12.5  HCT 38.7 36.3 37.8 37 36 37  MCV 96.5 94.0 96.2  --   --   --   PLT 201 202 194 190 186 212   Lab Results  Component Value Date   TSH 4.13 07/26/2021   Lab Results  Component Value Date   HGBA1C 5.6 10/15/2013   No results found for: "CHOL", "HDL", "LDLCALC", "LDLDIRECT", "TRIG", "CHOLHDL"  Significant Diagnostic Results in last 30 days:  No results found.  Assessment/Plan  1. DNR (do not resuscitate)  - DNR (Do Not Resuscitate)  2. Essential hypertension Controlled Continue avapro  3. Hypothyroidism, postsurgical history of thyroid cancer Continue synthroid Recheck TSH  4. Parkinson's disease (tremor, stiffness, slow motion, unstable posture) (HCC) Overall progressively worse but improved alertness and tremor over the past few weeks after discontinuing lexapro and wellbutrin and treating for UTI.  Continue sinemet  5. Acute encephalopathy Resolved.   6. Depression with anxiety Discussed with Dr. Lyndel Safe. Will try low dose clonazepam. If not working consider remeron - clonazePAM (KLONOPIN) 0.5 MG tablet; Take 0.5 tablets (0.25 mg total) by mouth in the morning.  Dispense: 15 tablet; Refill: 1  7. Contusion of left foot, initial encounter ?etiology Foot with bruise and swelling. No significant pain Continue to monitor.    Family/  staff Communication: discussed with husband Mr. Gruel  Labs/tests ordered:  BMP TSH due 9/25

## 2021-10-18 ENCOUNTER — Telehealth: Payer: Self-pay | Admitting: Adult Health

## 2021-10-18 LAB — TSH: TSH: 20.3 — AB (ref 0.41–5.90)

## 2021-10-18 LAB — BASIC METABOLIC PANEL
BUN: 25 — AB (ref 4–21)
CO2: 28 — AB (ref 13–22)
Chloride: 103 (ref 99–108)
Creatinine: 0.9 (ref 0.5–1.1)
Glucose: 87
Potassium: 4.1 mEq/L (ref 3.5–5.1)
Sodium: 138 (ref 137–147)

## 2021-10-18 LAB — COMPREHENSIVE METABOLIC PANEL
Calcium: 9 (ref 8.7–10.7)
eGFR: 65

## 2021-10-18 MED ORDER — LEVOTHYROXINE SODIUM 125 MCG PO TABS: 125.0000 ug | ORAL_TABLET | Freq: Every day | ORAL | 0 refills | Status: DC

## 2021-10-18 NOTE — Telephone Encounter (Signed)
TSH returned at 20.3.  Pt changed from brand name to generic med. Still being given on 3rd shift on an empty stomach. Will increase dose to 125 mcg and recheck TSH in 6 weeks.

## 2021-10-20 ENCOUNTER — Encounter: Payer: Self-pay | Admitting: Internal Medicine

## 2021-10-29 LAB — TSH: TSH: 34.9 — AB (ref 0.41–5.90)

## 2021-11-02 ENCOUNTER — Encounter: Payer: Self-pay | Admitting: Internal Medicine

## 2021-11-18 ENCOUNTER — Non-Acute Institutional Stay (SKILLED_NURSING_FACILITY): Payer: Medicare Other | Admitting: Adult Health

## 2021-11-18 DIAGNOSIS — I10 Essential (primary) hypertension: Secondary | ICD-10-CM | POA: Diagnosis not present

## 2021-11-18 DIAGNOSIS — E890 Postprocedural hypothyroidism: Secondary | ICD-10-CM | POA: Diagnosis not present

## 2021-11-18 DIAGNOSIS — F418 Other specified anxiety disorders: Secondary | ICD-10-CM | POA: Diagnosis not present

## 2021-11-18 DIAGNOSIS — G20B2 Parkinson's disease with dyskinesia, with fluctuations: Secondary | ICD-10-CM

## 2021-11-18 DIAGNOSIS — K5901 Slow transit constipation: Secondary | ICD-10-CM

## 2021-11-19 ENCOUNTER — Other Ambulatory Visit: Payer: Self-pay | Admitting: Adult Health

## 2021-11-19 ENCOUNTER — Encounter: Payer: Self-pay | Admitting: Adult Health

## 2021-11-19 DIAGNOSIS — F418 Other specified anxiety disorders: Secondary | ICD-10-CM

## 2021-11-19 DIAGNOSIS — K5901 Slow transit constipation: Secondary | ICD-10-CM | POA: Insufficient documentation

## 2021-11-19 MED ORDER — CLONAZEPAM 0.5 MG PO TABS: 0.2500 mg | ORAL_TABLET | Freq: Every morning | ORAL | 3 refills | Status: DC

## 2021-11-19 NOTE — Progress Notes (Signed)
Location:  Occupational psychologist of Service:  SNF (31) Provider:   Cindi Carbon, [place] [phone]   Virgie Dad, MD  Patient Care Team: Virgie Dad, MD as PCP - General (Internal Medicine) Belva Crome, MD as Consulting Physician (Cardiology)  Extended Emergency Contact Information Primary Emergency Contact: [facility] Address: [address]          [place], [place] [address] [name] of [place] Phone: [phone] Work Phone: [phone] Mobile Phone: [phone] Relation: Spouse Secondary Emergency Contact: Hanley Ben , [place] of [place] Phone: [phone] Relation: Granddaughter  Code Status:  DNR Goals of care: Advanced Directive information    10/08/2021    9:44 AM  Advanced Directives  Does Patient Have a Medical Advance Directive? Yes  Type of Paramedic of [place];Living will;Out of facility DNR (pink MOST or yellow form)  Copy of [place] in Chart? Yes - validated most recent copy scanned in chart (See row information)     Chief Complaint  Patient presents with   Medical Management of Chronic Issues    HPI:  Pt is a 88 y.o. female seen today for medical management of chronic diseases.    She is now enrolled in hospice due to PD, dementia, and functional decline.  She has anxiety and depression. Off wellbutrin and lexapro for possible s/e of myoclonic type jerking which improved. Continues with PD symptoms of tremor at baseline. Also has slow movement, rigidity, and dysphonia.   She was started on clonazepam due to anxiety and shaking. Symptoms are much improved per nurse. Does still have some periods of anxiety and crying, also paranoid. Using prn ativan for this which helps.   Husband declined HST for sleep apnea screening.   MMSE 22/30 06/13/2021. CT head 05/23/2021 noted atrophy with chronic small  vessel ischemic changes. Off exelon due to cost and lack of benefit.   BP running high at times, 150-180s. No edema or sob.   S/p thyroidectomy due to thyroid ca, on synthroid.  Had changed to generic med, now monitoring tsh after dose increase.  Lab Results  Component Value Date   TSH 34.90 (A) 10/29/2021   Hx of bilateral mastectomy with breast reconstruction due to breast ca. No longer needs mammograms.    Osteoporosis: T score -2.8 07/23/20, previous on reclast per Dr Buddy Duty in 2022.  Past Medical History:  Diagnosis Date   Anxiety    Arthritis    knee & back    Blood type, Rh negative    Breast cancer (HCC)    Chronic fatigue    Depression    Diverticulosis    Endocervical polyp    GERD (gastroesophageal reflux disease)    Hypertension    Hypothyroidism    IBS (irritable bowel syndrome)    Insomnia    Lymphedema    Neuromuscular disorder ([place])    Parkinson's disease    Osteopenia 06/2015   T score -2.4 distal third of the radius   Parkinson disease    RBBB 10/07/2013   Thyroid cancer [facility])    Past Surgical History:  Procedure Laterality Date   APPENDECTOMY  1979   BREAST SURGERY  1979   MASTECTOMY AND RECONSTRUCTION   DILATION AND CURETTAGE OF UTERUS     MISCARRIAGE   EYE SURGERY Bilateral    w/ IOL   HYSTEROSCOPY  [id number]  AND D&C   MASTECTOMY Bilateral    THYROIDECTOMY     TONSILLECTOMY     TOTAL KNEE ARTHROPLASTY Right 10/07/2013   Procedure: RIGHT TOTAL KNEE ARTHROPLASTY;  Surgeon: Kerin Salen, MD;  Location: [place];  Service: Orthopedics;  Laterality: Right;   TUBAL LIGATION      Allergies  Allergen Reactions   Sulfa Antibiotics Rash   Biaxin [Clarithromycin] Other (See Comments)    Pt does not remember reaction    Ciprofloxacin Nausea And Vomiting   Hctz [Hydrochlorothiazide] Other (See Comments)    weakness and hyponatremia   Macrobid [Nitrofurantoin] Nausea Only   Other Nausea Only   Prednisone Other (See Comments)    Swelling and  facial redness    Zofran [Ondansetron]    Sulfamethoxazole-Trimethoprim Other (See Comments) and Rash    Sore throat    Outpatient Encounter Medications as of 11/18/2021  Medication Sig   acetaminophen (TYLENOL) 500 MG tablet Take 1,000 mg by mouth every 8 (eight) hours as needed.   Ascorbic Acid (VITAMIN C PO) Take 1,000 mg by mouth daily.   aspirin EC 81 MG tablet Take 81 mg by mouth daily.   carbidopa-levodopa (SINEMET IR) 25-100 MG tablet Take 1 tablet by mouth 4 (four) times daily. 0800, 1100, 1500, 2000   Carbidopa-Levodopa ER (SINEMET CR) 25-100 MG tablet controlled release Take 1 tablet by mouth at bedtime.   CRANBERRY-VITAMIN C-D MANNOSE PO Take 1 tablet by mouth 3 (three) times daily.   irbesartan (AVAPRO) 75 MG tablet Take 75 mg by mouth every morning.   LORazepam (ATIVAN) 0.5 MG tablet Take 0.25 mg by mouth daily as needed for anxiety.   Multiple Vitamin (MULTIVITAMIN) tablet Take 1 tablet by mouth daily.   NON FORMULARY estradiol vaginal cream; 0.2% (0.'2mg'$ /ml); amt: 1 mL; vaginal Special Instructions: Insert 1 mL into the vagina at bedtime 2 times weekly as directed. At Bedtime on Sun   polyethylene glycol (MIRALAX / GLYCOLAX) 17 g packet Take 17 g by mouth daily.   senna-docusate (SENOKOT-S) 8.6-50 MG tablet Take 1 tablet by mouth at bedtime.   [DISCONTINUED] clonazePAM (KLONOPIN) 0.5 MG tablet Take 0.5 tablets (0.25 mg total) by mouth in the morning.   No facility-administered encounter medications on file as of 11/18/2021.    Review of Systems  Constitutional:  Negative for activity change, appetite change, chills, diaphoresis, fatigue, fever and unexpected weight change.  HENT:  Negative for congestion.   Respiratory:  Negative for cough, shortness of breath and wheezing.   Cardiovascular:  Negative for chest pain, palpitations and leg swelling.  Gastrointestinal:  Negative for abdominal distention, abdominal pain, constipation and diarrhea.  Genitourinary:   Negative for difficulty urinating and dysuria.  Musculoskeletal:  Positive for gait problem. Negative for arthralgias, back pain, joint swelling and myalgias.  Neurological:  Positive for tremors, speech difficulty and weakness. Negative for dizziness, seizures, syncope, facial asymmetry, light-headedness, numbness and headaches.  Psychiatric/Behavioral:  Positive for confusion. Negative for agitation. The patient is not nervous/anxious.     Immunization History  Administered Date(s) Administered   DTP 01/24/1994   Influenza Split 10/17/2008, 10/23/2009, 11/18/2011, 11/21/2012, 12/10/2019   Influenza Whole 10/09/2007   Influenza,inj,Quad PF,6+ Mos 10/26/2010   Influenza-Unspecified 11/05/2013, 10/31/2014, 10/12/2015, 10/20/2016, 11/15/2017, 11/13/2020, 10/29/2021   Moderna SARS-COV2 Booster Vaccination 11/28/2019, 05/28/2020, 11/06/2020   Moderna Sars-Covid-2 Vaccination 01/29/2019, 02/28/2019   Pneumococcal Conjugate-13 10/23/2014   Pneumococcal Polysaccharide-23 10/25/2002   Tdap 05/23/2021   Zoster, Live 10/13/2005   Zoster, Unspecified 10/13/2005  Pertinent  Health Maintenance Due  Topic Date Due   INFLUENZA VACCINE  Completed   DEXA SCAN  Completed   PAP SMEAR-Modifier  Discontinued      04/19/2021   10:11 AM 04/19/2021   11:00 PM 04/20/2021   12:00 PM 04/29/2021   12:25 PM 05/23/2021   12:01 PM  Fall Risk  Patient Fall Risk Level High fall risk High fall risk High fall risk Low fall risk Moderate fall risk   Functional Status Survey:    Vitals:   11/19/21 1122  Weight: 119 lb 11.2 oz (54.3 kg)   Body mass index is 21.2 kg/m. Wt Readings from Last 3 Encounters:  11/19/21 119 lb 11.2 oz (54.3 kg)  10/08/21 118 lb 6.4 oz (53.7 kg)  09/13/21 113 lb 12.8 oz (51.6 kg)    Physical Exam Vitals and nursing note reviewed.  Constitutional:      General: She is not in acute distress.    Appearance: She is not diaphoretic.  HENT:     Head: Normocephalic and atraumatic.      Mouth/Throat:     Mouth: Mucous membranes are moist.     Pharynx: Oropharynx is clear.  Eyes:     Pupils: Pupils are equal, round, and reactive to light.  Neck:     Vascular: No JVD.  Cardiovascular:     Rate and Rhythm: Normal rate and regular rhythm.     Heart sounds: No murmur heard. Pulmonary:     Effort: Pulmonary effort is normal. No respiratory distress.     Breath sounds: Normal breath sounds. No wheezing.  Abdominal:     General: Bowel sounds are normal. There is no distension.     Palpations: Abdomen is soft.     Tenderness: There is no abdominal tenderness.  Skin:    General: Skin is warm and dry.  Neurological:     General: No focal deficit present.     Mental Status: She is alert. Mental status is at baseline.     Comments: Dysphonia, bradykinesia and rigidity  Psychiatric:        Mood and Affect: Mood normal.     Labs reviewed: Recent Labs    04/19/21 1010 04/20/21 0307 04/29/21 1343 07/26/21 0000 09/09/21 0000 09/13/21 0000 10/18/21 0000  NA 138 138 138   < > 138 138 138  K 3.5 3.2* 3.9   < > 3.9 3.7 4.1  CL 106 108 107   < > 102 101 103  CO2 '25 25 26   '$ < > 30* 30* 28*  GLUCOSE 95 98 94  --   --   --   --   BUN 26* 24* 26*   < > 22* 25* 25*  CREATININE 0.88 0.78 0.87   < > 0.9 0.7 0.9  CALCIUM 8.6* 8.5* 8.4*   < > 8.5* 9.0 9.0   < > = values in this interval not displayed.    Recent Labs    04/19/21 1010 04/29/21 1343 09/09/21 0000 09/13/21 0000  AST '15 18 16 17  '$ ALT '9 17 8 8  '$ ALKPHOS 59 59 78 77  BILITOT 1.2 0.6  --   --   PROT 6.1* 5.9*  --   --   ALBUMIN 3.4* 3.3* 3.7 3.9    Recent Labs    04/19/21 1010 04/20/21 0307 04/29/21 1343 07/26/21 0000 09/09/21 0000 09/13/21 0000  WBC 5.4 6.4 6.0 6.5 5.8 6.5  NEUTROABS  --  4.5 4.1  --  4.10  --   HGB 12.7 12.3 12.3 12.5 12.2 12.5  HCT 38.7 36.3 37.8 37 36 37  MCV 96.5 94.0 96.2  --   --   --   PLT 201 202 194 190 186 212    Lab Results  Component Value Date   TSH 34.90  (A) 10/29/2021   Lab Results  Component Value Date   HGBA1C 5.6 10/15/2013   No results found for: "CHOL", "HDL", "LDLCALC", "LDLDIRECT", "TRIG", "CHOLHDL"  Significant Diagnostic Results in last 30 days:  No results found.  Assessment/Plan   Essential hypertension Elevated on avapro.  Needs manual bp readings.  Will ask nurse to check Would not be aggressive in management due to hospice status.    Hypothyroidism, postsurgical history of thyroid cancer Continue synthroid Recheck TSH 11/29/21  Parkinson's disease (tremor, stiffness, slow motion, unstable posture) (HCC) Severe stage Followed by hospice Tremors and lethargy improved after stopping wellbutrin and lexapro  Continue sinemet   Depression with anxiety Significant improvement with Clonazepam bid  Constipation Continue miralax and senokot.     Family/ staff Communication: discussed with nurse   Labs/tests ordered:  TSH 11/29/21

## 2021-11-23 ENCOUNTER — Ambulatory Visit: Payer: Medicare Other | Admitting: Adult Health

## 2021-12-03 ENCOUNTER — Encounter: Payer: Self-pay | Admitting: Adult Health

## 2021-12-03 ENCOUNTER — Non-Acute Institutional Stay (SKILLED_NURSING_FACILITY): Payer: Medicare Other | Admitting: Adult Health

## 2021-12-03 DIAGNOSIS — F22 Delusional disorders: Secondary | ICD-10-CM

## 2021-12-03 DIAGNOSIS — R58 Hemorrhage, not elsewhere classified: Secondary | ICD-10-CM | POA: Diagnosis not present

## 2021-12-03 DIAGNOSIS — F419 Anxiety disorder, unspecified: Secondary | ICD-10-CM

## 2021-12-03 DIAGNOSIS — F32A Depression, unspecified: Secondary | ICD-10-CM | POA: Diagnosis not present

## 2021-12-03 MED ORDER — QUETIAPINE FUMARATE 25 MG PO TABS: 12.5000 mg | ORAL_TABLET | Freq: Two times a day (BID) | ORAL | 0 refills | Status: DC

## 2021-12-03 NOTE — Progress Notes (Signed)
Location:  Occupational psychologist of Service:  SNF (31) Provider:   Cindi Carbon, [place] [phone]   Virgie Dad, MD  Patient Care Team: Virgie Dad, MD as PCP - General (Internal Medicine) Belva Crome, MD as Consulting Physician (Cardiology)  Extended Emergency Contact Information Primary Emergency Contact: [facility] Address: [address]          [place], [place] [address] [name] of [place] Phone: [phone] Work Phone: [phone] Mobile Phone: [phone] Relation: Spouse Secondary Emergency Contact: Hanley Ben , [place] of [place] Phone: [phone] Relation: Granddaughter  Code Status:  DNR Goals of care: Advanced Directive information    10/08/2021    9:44 AM  Advanced Directives  Does Patient Have a Medical Advance Directive? Yes  Type of Paramedic of [place];Living will;Out of facility DNR (pink MOST or yellow form)  Copy of [place] in Chart? Yes - validated most recent copy scanned in chart (See row information)     Chief Complaint  Patient presents with   Acute Visit    F/u lethargy    HPI:  Pt is a 88 y.o. female seen today for an acute visit for lethargy.   She is now enrolled in hospice due to PD, dementia, and functional decline.  She has anxiety and depression. Off wellbutrin and lexapro for possible s/e of myoclonic type jerking which improved. Continues with PD symptoms of tremor at baseline. Also has slow movement, rigidity, and dysphonia.   She was started on Seroquel 25 mg q 6 hrs by hospice. The nurse reported she became very lethargic, did not eat, slept all day and was picking in the air. Seroquel changed to PRN on 11/9.  Now she is wide awake and crying about "the woman that is bothering her". Last night she was up and did not sleep, concerned about her 13 children. Seems paranoid  delusion with anxiety and tearfulness.   Staff report bruise to sternum Past Medical History:  Diagnosis Date   Anxiety    Arthritis    knee & back    Blood type, Rh negative    Breast cancer (HCC)    Chronic fatigue    Depression    Diverticulosis    Endocervical polyp    GERD (gastroesophageal reflux disease)    Hypertension    Hypothyroidism    IBS (irritable bowel syndrome)    Insomnia    Lymphedema    Neuromuscular disorder ([place])    Parkinson's disease    Osteopenia 06/2015   T score -2.4 distal third of the radius   Parkinson disease    RBBB 10/07/2013   Thyroid cancer [facility])    Past Surgical History:  Procedure Laterality Date   APPENDECTOMY  1979   BREAST SURGERY  1979   MASTECTOMY AND RECONSTRUCTION   DILATION AND CURETTAGE OF UTERUS     MISCARRIAGE   EYE SURGERY Bilateral    w/ IOL   HYSTEROSCOPY  2005,2009   AND D&C   MASTECTOMY Bilateral    THYROIDECTOMY     TONSILLECTOMY     TOTAL KNEE ARTHROPLASTY Right 10/07/2013   Procedure: RIGHT TOTAL KNEE ARTHROPLASTY;  Surgeon: Kerin Salen, MD;  Location: [place];  Service: Orthopedics;  Laterality: Right;   TUBAL LIGATION      Allergies  Allergen Reactions   Sulfa Antibiotics Rash  Biaxin [Clarithromycin] Other (See Comments)    Pt does not remember reaction    Ciprofloxacin Nausea And Vomiting   Hctz [Hydrochlorothiazide] Other (See Comments)    weakness and hyponatremia   Macrobid [Nitrofurantoin] Nausea Only   Other Nausea Only   Prednisone Other (See Comments)    Swelling and facial redness    Zofran [Ondansetron]    Sulfamethoxazole-Trimethoprim Other (See Comments) and Rash    Sore throat    Outpatient Encounter Medications as of 12/03/2021  Medication Sig   QUEtiapine (SEROQUEL) 25 MG tablet Take 0.5 tablets (12.5 mg total) by mouth 2 (two) times daily.   acetaminophen (TYLENOL) 500 MG tablet Take 1,000 mg by mouth every 8 (eight) hours as needed.   Ascorbic Acid (VITAMIN C PO) Take 1,000  mg by mouth daily.   aspirin EC 81 MG tablet Take 81 mg by mouth daily.   carbidopa-levodopa (SINEMET IR) 25-100 MG tablet Take 1 tablet by mouth 4 (four) times daily. 0800, 1100, 1500, 2000   Carbidopa-Levodopa ER (SINEMET CR) 25-100 MG tablet controlled release Take 1 tablet by mouth at bedtime.   clonazePAM (KLONOPIN) 0.5 MG tablet Take 0.5 tablets (0.25 mg total) by mouth in the morning.   CRANBERRY-VITAMIN C-D MANNOSE PO Take 1 tablet by mouth 3 (three) times daily.   irbesartan (AVAPRO) 75 MG tablet Take 75 mg by mouth every morning.   levothyroxine (SYNTHROID) 125 MCG tablet Take 1 tablet (125 mcg total) by mouth daily.   LORazepam (ATIVAN) 0.5 MG tablet Take 0.25 mg by mouth daily as needed for anxiety.   Multiple Vitamin (MULTIVITAMIN) tablet Take 1 tablet by mouth daily.   NON FORMULARY estradiol vaginal cream; 0.2% (0.'2mg'$ /ml); amt: 1 mL; vaginal Special Instructions: Insert 1 mL into the vagina at bedtime 2 times weekly as directed. At Bedtime on Sun   polyethylene glycol (MIRALAX / GLYCOLAX) 17 g packet Take 17 g by mouth daily.   senna-docusate (SENOKOT-S) 8.6-50 MG tablet Take 1 tablet by mouth at bedtime.   No facility-administered encounter medications on file as of 12/03/2021.    Review of Systems  Unable to perform ROS: Dementia    Immunization History  Administered Date(s) Administered   DTP 01/24/1994   Influenza Split 10/17/2008, 10/23/2009, 11/18/2011, 11/21/2012, 12/10/2019   Influenza Whole 10/09/2007   Influenza,inj,Quad PF,6+ Mos 10/26/2010   Influenza-Unspecified 11/05/2013, 10/31/2014, 10/12/2015, 10/20/2016, 11/15/2017, 11/13/2020, 10/29/2021   Moderna SARS-COV2 Booster Vaccination 11/28/2019, 05/28/2020, 11/06/2020   Moderna Sars-Covid-2 Vaccination 01/29/2019, 02/28/2019   Pneumococcal Conjugate-13 10/23/2014   Pneumococcal Polysaccharide-23 10/25/2002   Tdap 05/23/2021   Zoster, Live 10/13/2005   Zoster, Unspecified 10/13/2005   Pertinent  Health  Maintenance Due  Topic Date Due   INFLUENZA VACCINE  Completed   DEXA SCAN  Completed   PAP SMEAR-Modifier  Discontinued      04/19/2021   10:11 AM 04/19/2021   11:00 PM 04/20/2021   12:00 PM 04/29/2021   12:25 PM 05/23/2021   12:01 PM  Fall Risk  Patient Fall Risk Level High fall risk High fall risk High fall risk Low fall risk Moderate fall risk   Functional Status Survey:    Vitals:   12/03/21 1255  BP: (!) 154/72  Pulse: 78   There is no height or weight on file to calculate BMI. Physical Exam Vitals and nursing note reviewed.  Constitutional:      General: She is not in acute distress.    Appearance: She is not diaphoretic.  HENT:  Head: Normocephalic and atraumatic.  Neck:     Vascular: No JVD.  Cardiovascular:     Rate and Rhythm: Normal rate and regular rhythm.     Heart sounds: No murmur heard. Pulmonary:     Effort: Pulmonary effort is normal. No respiratory distress.     Breath sounds: Normal breath sounds. No wheezing.     Comments: Bruise in sternum Abdominal:     General: Bowel sounds are normal. There is no distension.     Palpations: Abdomen is soft.     Tenderness: There is no abdominal tenderness.  Skin:    General: Skin is warm and dry.  Neurological:     Mental Status: She is alert.     Comments: Slow movement, rigidity, dysphonia. Paranoid. Oriented to self and place, not situation or time.      Labs reviewed: Recent Labs    04/19/21 1010 04/20/21 0307 04/29/21 1343 07/26/21 0000 09/09/21 0000 09/13/21 0000 10/18/21 0000  NA 138 138 138   < > 138 138 138  K 3.5 3.2* 3.9   < > 3.9 3.7 4.1  CL 106 108 107   < > 102 101 103  CO2 '25 25 26   '$ < > 30* 30* 28*  GLUCOSE 95 98 94  --   --   --   --   BUN 26* 24* 26*   < > 22* 25* 25*  CREATININE 0.88 0.78 0.87   < > 0.9 0.7 0.9  CALCIUM 8.6* 8.5* 8.4*   < > 8.5* 9.0 9.0   < > = values in this interval not displayed.   Recent Labs    04/19/21 1010 04/29/21 1343 09/09/21 0000  09/13/21 0000  AST '15 18 16 17  '$ ALT '9 17 8 8  '$ ALKPHOS 59 59 78 77  BILITOT 1.2 0.6  --   --   PROT 6.1* 5.9*  --   --   ALBUMIN 3.4* 3.3* 3.7 3.9   Recent Labs    04/19/21 1010 04/20/21 0307 04/29/21 1343 07/26/21 0000 09/09/21 0000 09/13/21 0000  WBC 5.4 6.4 6.0 6.5 5.8 6.5  NEUTROABS  --  4.5 4.1  --  4.10  --   HGB 12.7 12.3 12.3 12.5 12.2 12.5  HCT 38.7 36.3 37.8 37 36 37  MCV 96.5 94.0 96.2  --   --   --   PLT 201 202 194 190 186 212   Lab Results  Component Value Date   TSH 34.90 (A) 10/29/2021   Lab Results  Component Value Date   HGBA1C 5.6 10/15/2013   No results found for: "CHOL", "HDL", "LDLCALC", "LDLDIRECT", "TRIG", "CHOLHDL"  Significant Diagnostic Results in last 30 days:  No results found.  Assessment/Plan 1. Paranoia ([place]) Did not tolerated higher dose of seroquel. Will start very low dose and titrate upward 12.5 mg bid and d/c prn dosing  2. Anxiety and depression Continues to have anxiety and tearfulness Would keep clonazepam and ativan prn  3. Ecchymosis Noted to sternum Discussed with nurse, ?transfer safety in lift. Will have OT eval.     Family/ staff Communication: discussed with her husband and nurse   Labs/tests ordered:  NA

## 2021-12-13 ENCOUNTER — Encounter: Payer: Self-pay | Admitting: Internal Medicine

## 2021-12-13 ENCOUNTER — Non-Acute Institutional Stay (SKILLED_NURSING_FACILITY): Admitting: Internal Medicine

## 2021-12-13 DIAGNOSIS — T148XXA Other injury of unspecified body region, initial encounter: Secondary | ICD-10-CM

## 2021-12-13 DIAGNOSIS — R259 Unspecified abnormal involuntary movements: Secondary | ICD-10-CM | POA: Diagnosis not present

## 2021-12-13 DIAGNOSIS — G20B2 Parkinson's disease with dyskinesia, with fluctuations: Secondary | ICD-10-CM | POA: Diagnosis not present

## 2021-12-13 DIAGNOSIS — I10 Essential (primary) hypertension: Secondary | ICD-10-CM

## 2021-12-13 DIAGNOSIS — E890 Postprocedural hypothyroidism: Secondary | ICD-10-CM

## 2021-12-13 DIAGNOSIS — F418 Other specified anxiety disorders: Secondary | ICD-10-CM

## 2021-12-13 NOTE — Progress Notes (Signed)
Location:  Occupational psychologist of Service:  SNF (31)  Provider:   Code Status: DNR Hospice Goals of Care:     10/08/2021    9:44 AM  Advanced Directives  Does Patient Have a Medical Advance Directive? Yes  Type of Paramedic of [place];Living will;Out of facility DNR (pink MOST or yellow form)  Copy of [place] in Chart? Yes - validated most recent copy scanned in chart (See row information)     Chief Complaint  Patient presents with   Chronic Care Management    HPI: Patient is a 88 y.o. female seen today for medical management of chronic diseases.   Lives in SNF Enrolled in [place]  She has h/o  Parkinson.   h/o cognitive impairment  Hypertension Hypothyroidism Depression Urinary In continence Osteoporosis Patient has had h/o Dyskinetic movement and slurred speech in 04/23 with MRI and EEG which was negative and it usually it gets resolved  Patient has been stable since she was started on Klonopin.  She has not had any abnormal movements.  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 Yesterday the nurses noticed a big bruise below her left Axillary region with some swelling.  Patient is complaining of mild pain in that area No Other issues  . No new Nursing issues. No Behavior issues Her weight is stable Does not walk but can sometimes do her transfers No Falls Wt Readings from Last 3 Encounters:  12/13/21 120 lb (54.4 kg)  11/19/21 119 lb 11.2 oz (54.3 kg)  10/08/21 118 lb 6.4 oz (53.7 kg)    Past Medical History:  Diagnosis Date   Anxiety    Arthritis    knee & back    Blood type, Rh negative    Breast cancer (HCC)    Chronic fatigue    Depression    Diverticulosis    Endocervical polyp    GERD (gastroesophageal reflux disease)    Hypertension    Hypothyroidism    IBS (irritable bowel syndrome)    Insomnia    Lymphedema    Neuromuscular disorder ([place])    Parkinson's disease    Osteopenia  06/2015   T score -2.4 distal third of the radius   Parkinson disease    RBBB 10/07/2013   Thyroid cancer [facility])     Past Surgical History:  Procedure Laterality Date   APPENDECTOMY  1979   BREAST SURGERY  1979   MASTECTOMY AND RECONSTRUCTION   DILATION AND CURETTAGE OF UTERUS     MISCARRIAGE   EYE SURGERY Bilateral    w/ IOL   HYSTEROSCOPY  2005,2009   AND D&C   MASTECTOMY Bilateral    THYROIDECTOMY     TONSILLECTOMY     TOTAL KNEE ARTHROPLASTY Right 10/07/2013   Procedure: RIGHT TOTAL KNEE ARTHROPLASTY;  Surgeon: Kerin Salen, MD;  Location: [place];  Service: Orthopedics;  Laterality: Right;   TUBAL LIGATION      Allergies  Allergen Reactions   Sulfa Antibiotics Rash   Biaxin [Clarithromycin] Other (See Comments)    Pt does not remember reaction    Ciprofloxacin Nausea And Vomiting   Hctz [Hydrochlorothiazide] Other (See Comments)    weakness and hyponatremia   Macrobid [Nitrofurantoin] Nausea Only   Other Nausea Only   Prednisone Other (See Comments)    Swelling and facial redness    Zofran [Ondansetron]    Sulfamethoxazole-Trimethoprim Other (See Comments) and Rash    Sore throat  Outpatient Encounter Medications as of 12/13/2021  Medication Sig   acetaminophen (TYLENOL) 500 MG tablet Take 1,000 mg by mouth every 8 (eight) hours as needed.   Ascorbic Acid (VITAMIN C PO) Take 1,000 mg by mouth daily.   aspirin EC 81 MG tablet Take 81 mg by mouth daily.   carbidopa-levodopa (SINEMET IR) 25-100 MG tablet Take 1 tablet by mouth 4 (four) times daily. 0800, 1100, 1500, 2000   Carbidopa-Levodopa ER (SINEMET CR) 25-100 MG tablet controlled release Take 1 tablet by mouth at bedtime.   clonazePAM (KLONOPIN) 0.5 MG tablet Take 0.5 tablets (0.25 mg total) by mouth in the morning. (Patient taking differently: Take 0.25 mg by mouth 2 (two) times daily.)   CRANBERRY-VITAMIN C-D MANNOSE PO Take 1 tablet by mouth 3 (three) times daily.   irbesartan (AVAPRO) 75 MG tablet Take  75 mg by mouth every morning.   levothyroxine (SYNTHROID) 125 MCG tablet Take 1 tablet (125 mcg total) by mouth daily.   LORazepam (ATIVAN) 0.5 MG tablet Take 0.25 mg by mouth daily as needed for anxiety.   Multiple Vitamin (MULTIVITAMIN) tablet Take 1 tablet by mouth daily.   NON FORMULARY estradiol vaginal cream; 0.2% (0.'2mg'$ /ml); amt: 1 mL; vaginal Special Instructions: Insert 1 mL into the vagina at bedtime 2 times weekly as directed. At Bedtime on Sun   polyethylene glycol (MIRALAX / GLYCOLAX) 17 g packet Take 17 g by mouth daily.   QUEtiapine (SEROQUEL) 25 MG tablet Take 0.5 tablets (12.5 mg total) by mouth 2 (two) times daily.   senna-docusate (SENOKOT-S) 8.6-50 MG tablet Take 1 tablet by mouth at bedtime.   No facility-administered encounter medications on file as of 12/13/2021.    Review of Systems:  Review of Systems  Constitutional:  Positive for activity change. Negative for appetite change.  HENT: Negative.    Respiratory:  Negative for cough and shortness of breath.   Cardiovascular:  Negative for leg swelling.  Gastrointestinal:  Negative for constipation.  Genitourinary: Negative.   Musculoskeletal:  Positive for gait problem. Negative for arthralgias and myalgias.  Skin:  Positive for color change.  Neurological:  Negative for dizziness and weakness.  Psychiatric/Behavioral:  Positive for confusion. Negative for dysphoric mood and sleep disturbance.     Health Maintenance  Topic Date Due   COVID-19 Vaccine (3 - Moderna risk series) 12/15/2021 (Originally 12/04/2020)   Zoster Vaccines- Shingrix (1 of 2) 10/04/2022 (Originally 03/09/1948)   Pneumonia Vaccine 44+ Years old  Completed   INFLUENZA VACCINE  Completed   DEXA SCAN  Completed   HPV VACCINES  Aged Out   PAP SMEAR-Modifier  Discontinued    Physical Exam: Vitals:   12/13/21 1943  BP: (!) 148/91  Pulse: 76  Resp: 16  Temp: (!) 97 F (36.1 C)  Weight: 120 lb (54.4 kg)   Body mass index is 21.26  kg/m. Physical Exam Vitals reviewed.  Constitutional:      Appearance: Normal appearance.  HENT:     Head: Normocephalic.     Nose: Nose normal.     Mouth/Throat:     Mouth: Mucous membranes are moist.     Pharynx: Oropharynx is clear.  Eyes:     Pupils: Pupils are equal, round, and reactive to light.  Cardiovascular:     Rate and Rhythm: Normal rate and regular rhythm.     Pulses: Normal pulses.     Heart sounds: Normal heart sounds. No murmur heard. Pulmonary:     Effort: Pulmonary effort is  normal.     Breath sounds: Normal breath sounds.  Abdominal:     General: Abdomen is flat. Bowel sounds are normal.     Palpations: Abdomen is soft.  Musculoskeletal:        General: No swelling.     Cervical back: Neck supple.  Skin:    General: Skin is warm.  Neurological:     General: No focal deficit present.     Mental Status: She is alert.     Comments: Does Slurs her Words sometimes Mild Confusion Responds Appropriately  Psychiatric:        Mood and Affect: Mood normal.        Thought Content: Thought content normal.     Labs reviewed: Basic Metabolic Panel: Recent Labs    04/19/21 1010 04/20/21 0307 04/29/21 1343 04/29/21 1343 07/26/21 0000 09/09/21 0000 09/13/21 0000 10/18/21 0000 10/29/21 0000  NA 138 138 138   < > 143 138 138 138  --   K 3.5 3.2* 3.9  --  4.4 3.9 3.7 4.1  --   CL 106 108 107  --  105 102 101 103  --   CO2 '25 25 26  '$ --  24* 30* 30* 28*  --   GLUCOSE 95 98 94  --   --   --   --   --   --   BUN 26* 24* 26*   < > 25* 22* 25* 25*  --   CREATININE 0.88 0.78 0.87   < > 0.9 0.9 0.7 0.9  --   CALCIUM 8.6* 8.5* 8.4*  --  8.8 8.5* 9.0 9.0  --   TSH  --  1.635  --   --  4.13  --   --  20.30* 34.90*   < > = values in this interval not displayed.   Liver Function Tests: Recent Labs    04/19/21 1010 04/29/21 1343 09/09/21 0000 09/13/21 0000  AST '15 18 16 17  '$ ALT '9 17 8 8  '$ ALKPHOS 59 59 78 77  BILITOT 1.2 0.6  --   --   PROT 6.1* 5.9*  --    --   ALBUMIN 3.4* 3.3* 3.7 3.9   No results for input(s): "LIPASE", "AMYLASE" in the last 8760 hours. Recent Labs    04/19/21 1329  AMMONIA 19   CBC: Recent Labs    04/19/21 1010 04/20/21 0307 04/29/21 1343 07/26/21 0000 09/09/21 0000 09/13/21 0000  WBC 5.4 6.4 6.0 6.5 5.8 6.5  NEUTROABS  --  4.5 4.1  --  4.10  --   HGB 12.7 12.3 12.3 12.5 12.2 12.5  HCT 38.7 36.3 37.8 37 36 37  MCV 96.5 94.0 96.2  --   --   --   PLT 201 202 194 190 186 212   Lipid Panel: No results for input(s): "CHOL", "HDL", "LDLCALC", "TRIG", "CHOLHDL", "LDLDIRECT" in the last 8760 hours. Lab Results  Component Value Date   HGBA1C 5.6 10/15/2013    Procedures since last visit: No results found.  Assessment/Plan 1. Parkinson's disease with dyskinesia and fluctuating manifestations On Sinemet Now not Ambulatory Enrolled in Hospice 2. Abnormal movements Have resolved with small dose of Klonopin Work up before negative  3. Hypothyroidism, postsurgical history of thyroid cancer TSH was high and dose has been changed  Repeat pending 4. Essential hypertension Avapro  5. Depression with anxiety and Paranoia Was taken off Lexapro due to Side effects On Seroquel low dose  6. Bruise D/w  Nurses to monitor    Labs/tests ordered:  * No order type specified * Next appt:  Visit date not found

## 2021-12-23 ENCOUNTER — Ambulatory Visit: Payer: Medicare Other | Admitting: Adult Health

## 2022-01-11 LAB — TSH: TSH: 12.5 — AB (ref 0.41–5.90)

## 2022-01-12 ENCOUNTER — Other Ambulatory Visit: Payer: Self-pay | Admitting: Orthopedic Surgery

## 2022-01-12 ENCOUNTER — Other Ambulatory Visit: Payer: Self-pay | Admitting: Internal Medicine

## 2022-01-12 DIAGNOSIS — F418 Other specified anxiety disorders: Secondary | ICD-10-CM

## 2022-01-12 MED ORDER — CLONAZEPAM 0.5 MG PO TABS: 0.2500 mg | ORAL_TABLET | Freq: Two times a day (BID) | ORAL | 0 refills | Status: DC

## 2022-01-14 ENCOUNTER — Encounter: Payer: Self-pay | Admitting: Adult Health

## 2022-01-14 ENCOUNTER — Non-Acute Institutional Stay (SKILLED_NURSING_FACILITY): Payer: Medicare Other | Admitting: Adult Health

## 2022-01-14 ENCOUNTER — Telehealth: Payer: Medicare Other

## 2022-01-14 DIAGNOSIS — E890 Postprocedural hypothyroidism: Secondary | ICD-10-CM

## 2022-01-14 DIAGNOSIS — K5901 Slow transit constipation: Secondary | ICD-10-CM

## 2022-01-14 DIAGNOSIS — I10 Essential (primary) hypertension: Secondary | ICD-10-CM

## 2022-01-14 DIAGNOSIS — U071 COVID-19: Secondary | ICD-10-CM | POA: Diagnosis not present

## 2022-01-14 DIAGNOSIS — G20B2 Parkinson's disease with dyskinesia, with fluctuations: Secondary | ICD-10-CM

## 2022-01-14 DIAGNOSIS — F418 Other specified anxiety disorders: Secondary | ICD-10-CM

## 2022-01-14 DIAGNOSIS — R259 Unspecified abnormal involuntary movements: Secondary | ICD-10-CM

## 2022-01-14 MED ORDER — MOLNUPIRAVIR EUA 200MG CAPSULE: 4.0000 | ORAL_CAPSULE | Freq: Two times a day (BID) | ORAL | 0 refills | Status: AC

## 2022-01-14 NOTE — Progress Notes (Signed)
Location:  [facility] Room Number: 130A Place of Service:  SNF [phone]) Provider:  Royal Hawthorn, NP  Virgie Dad, MD  Patient Care Team: Virgie Dad, MD as PCP - General (Internal Medicine) Belva Crome, MD as Consulting Physician (Cardiology)  Extended Emergency Contact Information Primary Emergency Contact: [name] Address: [address]          [place], [place] [address] [name] of [place] Phone: [phone] Work Phone: [phone] Mobile Phone: [phone] Relation: Spouse Secondary Emergency Contact: Hanley Ben , [place] of [place] Phone: [phone] Relation: Granddaughter  Code Status:  DNR/ Hospice Goals of care: Advanced Directive information    01/14/2022    9:19 AM  Advanced Directives  Does Patient Have a Medical Advance Directive? Yes  Type of Advance Directive Out of facility DNR (pink MOST or yellow form)  Does patient want to make changes to medical advance directive? No - Patient declined     Chief Complaint  Patient presents with   Medical Management of Chronic Issues    Routine visit    HPI:  Pt is a 88 y.o. female seen today for medical management of chronic diseases.    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 First rapid covid was negative. She was retested this am and the rapid was positive. Her temp is 101.1.  She has some mild nasal congestion. Normal 02 sats. Minimal cough without sputum production. She does have body aches. No sore throat or runny nose.   She is now enrolled in hospice due to PD, dementia, and functional decline.  She has anxiety and depression. Off wellbutrin and lexapro for possible s/e of myoclonic type jerking which improved. Continues with PD symptoms of tremor at baseline. Also has slow movement, rigidity, and dysphonia.   MMSE 22/30 06/13/2021. CT head 05/23/2021 noted atrophy with chronic  small vessel ischemic changes. Off exelon due to cost and lack of benefit.    S/p thyroidectomy due to thyroid ca, on synthroid  Lab Results  Component Value Date   TSH 12.50 (A) 01/11/2022  TSH trending down after changing to generic and dose increased.    Hx of bilateral mastectomy with breast reconstruction due to breast ca. No longer  No longer needs mammograms.    Osteoporosis: T score -2.8 07/23/20, previous on reclast per Dr Buddy Duty in 2022.    Past Surgical History:  Procedure Laterality Date   APPENDECTOMY  1979   BREAST SURGERY  1979   MASTECTOMY AND RECONSTRUCTION   DILATION AND CURETTAGE OF UTERUS     MISCARRIAGE   EYE SURGERY Bilateral    w/ IOL   HYSTEROSCOPY  2005,2009   AND D&C   MASTECTOMY Bilateral    THYROIDECTOMY     TONSILLECTOMY     TOTAL KNEE ARTHROPLASTY Right 10/07/2013   Procedure: RIGHT TOTAL KNEE ARTHROPLASTY;  Surgeon: Kerin Salen, MD;  Location: [facility];  Service: Orthopedics;  Laterality: Right;   TUBAL LIGATION      Allergies  Allergen Reactions   Sulfa Antibiotics Rash   Biaxin [Clarithromycin] Other (See Comments)    Pt does not remember reaction    Ciprofloxacin Nausea And Vomiting   Hctz [Hydrochlorothiazide] Other (See Comments)    weakness and hyponatremia   Macrobid [Nitrofurantoin] Nausea Only   Other Nausea Only   Prednisone Other (See Comments)    Swelling and facial  redness    Zofran [Ondansetron]    Sulfamethoxazole-Trimethoprim Other (See Comments) and Rash    Sore throat    Outpatient Encounter Medications as of 01/14/2022  Medication Sig   acetaminophen (TYLENOL) 500 MG tablet Take 1,000 mg by mouth every 8 (eight) hours as needed.   Ascorbic Acid (VITAMIN C PO) Take 1,000 mg by mouth daily.   aspirin EC 81 MG tablet Take 81 mg by mouth daily.   carbidopa-levodopa (SINEMET IR) 25-100 MG tablet Take 1 tablet by mouth 4 (four) times daily. 0800, 1100, 1500, 2000   Carbidopa-Levodopa ER (SINEMET CR) 25-100 MG tablet  controlled release Take 1 tablet by mouth at bedtime.   clonazePAM (KLONOPIN) 0.5 MG tablet Take 0.5 tablets (0.25 mg total) by mouth 2 (two) times daily.   CRANBERRY-VITAMIN C-D MANNOSE PO Take 1 tablet by mouth 3 (three) times daily.   irbesartan (AVAPRO) 75 MG tablet Take 75 mg by mouth every morning.   LORazepam (ATIVAN) 0.5 MG tablet Take 0.25 mg by mouth daily as needed for anxiety.   Multiple Vitamin (MULTIVITAMIN) tablet Take 1 tablet by mouth daily.   NON FORMULARY estradiol vaginal cream; 0.2% (0.'2mg'$ /ml); amt: 1 mL; vaginal Special Instructions: Insert 1 mL into the vagina at bedtime 2 times weekly as directed. At Bedtime on Sun   polyethylene glycol (MIRALAX / GLYCOLAX) 17 g packet Take 17 g by mouth daily.   QUEtiapine (SEROQUEL) 25 MG tablet Take 0.5 tablets (12.5 mg total) by mouth 2 (two) times daily.   senna-docusate (SENOKOT-S) 8.6-50 MG tablet Take 1 tablet by mouth at bedtime.   levothyroxine (SYNTHROID) 125 MCG tablet Take 1 tablet (125 mcg total) by mouth daily.   No facility-administered encounter medications on file as of 01/14/2022.    Review of Systems  Constitutional:  Positive for activity change, appetite change, chills, diaphoresis, fatigue and fever. Negative for unexpected weight change.  HENT:  Positive for congestion. Negative for rhinorrhea, sinus pressure, sinus pain, sneezing, sore throat and trouble swallowing.   Respiratory:  Positive for cough. Negative for shortness of breath and wheezing.   Cardiovascular:  Negative for chest pain, palpitations and leg swelling.  Gastrointestinal:  Negative for abdominal distention, abdominal pain, constipation and diarrhea.  Genitourinary:  Negative for difficulty urinating and dysuria.  Musculoskeletal:  Positive for gait problem. Negative for arthralgias, back pain, joint swelling and myalgias.  Neurological:  Positive for speech difficulty (dysphonia). Negative for dizziness, tremors, seizures, syncope, facial  asymmetry, weakness, light-headedness, numbness and headaches.  Psychiatric/Behavioral:  Positive for confusion. Negative for agitation and behavioral problems.     Immunization History  Administered Date(s) Administered   Covid-19, Mrna,Vaccine(Spikevax)55yr and older 11/29/2021   DTP 01/24/1994   Influenza Split 10/17/2008, 10/23/2009, 11/18/2011, 11/21/2012, 12/10/2019   Influenza Whole 10/09/2007   Influenza,inj,Quad PF,6+ Mos 10/26/2010   Influenza-Unspecified 11/05/2013, 10/31/2014, 10/12/2015, 10/20/2016, 11/15/2017, 11/13/2020, 10/29/2021   Moderna SARS-COV2 Booster Vaccination 11/28/2019, 05/28/2020, 11/06/2020   Moderna Sars-Covid-2 Vaccination 01/29/2019, 02/28/2019, 06/28/2021   Pneumococcal Conjugate-13 10/23/2014   Pneumococcal Polysaccharide-23 10/25/2002   Tdap 05/23/2021   Zoster, Live 10/13/2005   Zoster, Unspecified 10/13/2005   Pertinent  Health Maintenance Due  Topic Date Due   INFLUENZA VACCINE  Completed   DEXA SCAN  Completed   PAP SMEAR-Modifier  Discontinued      04/19/2021   10:11 AM 04/19/2021   11:00 PM 04/20/2021   12:00 PM 04/29/2021   12:25 PM 05/23/2021   12:01 PM  Fall Risk  Patient Fall Risk  Level High fall risk High fall risk High fall risk Low fall risk Moderate fall risk   Functional Status Survey:    Vitals:   01/14/22 0917  BP: (!) 154/69  Pulse: 74  Resp: 19  Temp: (!) 101.1 F (38.4 C)  TempSrc: Temporal  SpO2: 94%  Weight: 120 lb 6.4 oz (54.6 kg)  Height: '5\' 3"'$  (1.6 m)   Body mass index is 21.33 kg/m. Physical Exam Vitals and nursing note reviewed.  Constitutional:      General: She is not in acute distress.    Appearance: She is ill-appearing. She is not diaphoretic.  HENT:     Head: Normocephalic and atraumatic.     Nose: Congestion present.     Mouth/Throat:     Mouth: Mucous membranes are moist.     Pharynx: Oropharynx is clear.  Eyes:     Conjunctiva/sclera: Conjunctivae normal.     Pupils: Pupils are equal,  round, and reactive to light.  Neck:     Vascular: No JVD.  Cardiovascular:     Rate and Rhythm: Normal rate and regular rhythm.     Heart sounds: No murmur heard. Pulmonary:     Effort: Pulmonary effort is normal. No respiratory distress.     Breath sounds: Rales (faint left base) present. No wheezing.  Abdominal:     General: Bowel sounds are normal. There is no distension.     Palpations: Abdomen is soft.     Tenderness: There is no abdominal tenderness.  Musculoskeletal:     Cervical back: No rigidity or tenderness.     Right lower leg: No edema.     Left lower leg: No edema.  Lymphadenopathy:     Cervical: No cervical adenopathy.  Skin:    General: Skin is warm and dry.  Neurological:     General: No focal deficit present.     Mental Status: She is alert. Mental status is at baseline.     Comments: Dysphonia. Rigidity, low movements. Increased rigidity to BLE.     Labs reviewed: Recent Labs    04/19/21 1010 04/20/21 0307 04/29/21 1343 07/26/21 0000 09/09/21 0000 09/13/21 0000 10/18/21 0000  NA 138 138 138   < > 138 138 138  K 3.5 3.2* 3.9   < > 3.9 3.7 4.1  CL 106 108 107   < > 102 101 103  CO2 '25 25 26   '$ < > 30* 30* 28*  GLUCOSE 95 98 94  --   --   --   --   BUN 26* 24* 26*   < > 22* 25* 25*  CREATININE 0.88 0.78 0.87   < > 0.9 0.7 0.9  CALCIUM 8.6* 8.5* 8.4*   < > 8.5* 9.0 9.0   < > = values in this interval not displayed.   Recent Labs    04/19/21 1010 04/29/21 1343 09/09/21 0000 09/13/21 0000  AST '15 18 16 17  '$ ALT '9 17 8 8  '$ ALKPHOS 59 59 78 77  BILITOT 1.2 0.6  --   --   PROT 6.1* 5.9*  --   --   ALBUMIN 3.4* 3.3* 3.7 3.9   Recent Labs    04/19/21 1010 04/20/21 0307 04/29/21 1343 07/26/21 0000 09/09/21 0000 09/13/21 0000  WBC 5.4 6.4 6.0 6.5 5.8 6.5  NEUTROABS  --  4.5 4.1  --  4.10  --   HGB 12.7 12.3 12.3 12.5 12.2 12.5  HCT 38.7 36.3 37.8 37 36  37  MCV 96.5 94.0 96.2  --   --   --   PLT 201 202 194 190 186 212   Lab Results   Component Value Date   TSH 12.50 (A) 01/11/2022   Lab Results  Component Value Date   HGBA1C 5.6 10/15/2013   No results found for: "CHOL", "HDL", "LDLCALC", "LDLDIRECT", "TRIG", "CHOLHDL"  Significant Diagnostic Results in last 30 days:  No results found.  Assessment/Plan  1. COVID-19 virus infection Mild, not requiring oxygen at this time On isolation for 10 days  Adding monupiravir as she can not swallow the paxlovid - molnupiravir EUA (LAGEVRIO) 200 mg CAPS capsule; Take 4 capsules (800 mg total) by mouth 2 (two) times daily for 5 days.  Dispense: 40 capsule; Refill: 0  2. Hypothyroidism, postsurgical Lab Results  Component Value Date   TSH 12.50 (A) 01/11/2022    3. Essential hypertension Continue Avapro   4. Slow transit constipation Continue miralax and senokot.   5. Parkinson's disease with dyskinesia and fluctuating manifestations Currently followed by hospice Severe symptoms now in skilled care.  On sinemet  6. Abnormal movements Improved with clonazepam, unclear etiology   7. Depression with anxiety Off SSRI due to abnormal movements.  Mood did improve with seroquel.   Labs/tests ordered:  NA

## 2022-01-14 NOTE — Telephone Encounter (Signed)
Patient's son Shanon Brow) called to ask Cheral Almas why was the patient prescribed Lagevrio  and not paxlovid for her coivd. He states that he know paxlovid is a newer drug and he is not no doctor but would like to know the difference between the 2 medications.

## 2022-01-14 NOTE — Progress Notes (Signed)
01/11/2022 

## 2022-01-14 NOTE — Telephone Encounter (Signed)
This patient lives in skilled care so they should go through the nursing supervisor at [facility]. I prescribed molnupiravir because she was not able to swallow the paxlovid capsules. Pharmacy recommended molnupiravir since it can be opened and put in applesauce. I have asked the nursing supervisor to call the family member.

## 2022-02-07 ENCOUNTER — Other Ambulatory Visit: Payer: Self-pay | Admitting: Internal Medicine

## 2022-02-07 DIAGNOSIS — F418 Other specified anxiety disorders: Secondary | ICD-10-CM

## 2022-02-07 MED ORDER — CLONAZEPAM 0.5 MG PO TABS: 0.2500 mg | ORAL_TABLET | Freq: Two times a day (BID) | ORAL | 0 refills | Status: DC

## 2022-02-10 ENCOUNTER — Encounter: Payer: Self-pay | Admitting: Adult Health

## 2022-02-10 ENCOUNTER — Non-Acute Institutional Stay (SKILLED_NURSING_FACILITY): Payer: Medicare Other | Admitting: Adult Health

## 2022-02-10 DIAGNOSIS — G20B2 Parkinson's disease with dyskinesia, with fluctuations: Secondary | ICD-10-CM | POA: Diagnosis not present

## 2022-02-10 DIAGNOSIS — I10 Essential (primary) hypertension: Secondary | ICD-10-CM | POA: Diagnosis not present

## 2022-02-10 DIAGNOSIS — E890 Postprocedural hypothyroidism: Secondary | ICD-10-CM | POA: Diagnosis not present

## 2022-02-10 DIAGNOSIS — F02C2 Dementia in other diseases classified elsewhere, severe, with psychotic disturbance: Secondary | ICD-10-CM

## 2022-02-10 DIAGNOSIS — R531 Weakness: Secondary | ICD-10-CM

## 2022-02-10 DIAGNOSIS — G20A1 Parkinson's disease without dyskinesia, without mention of fluctuations: Secondary | ICD-10-CM | POA: Diagnosis not present

## 2022-02-10 NOTE — Progress Notes (Signed)
Location:   [facility] Room Number: [place] of Service:  SNF [id number]) Provider:  Royal Hawthorn, NP  Virgie Dad, MD  Patient Care Team: Virgie Dad, MD as PCP - General (Internal Medicine) Belva Crome, MD as Consulting Physician (Cardiology)  Extended Emergency Contact Information Primary Emergency Contact: [name] Address: [address]          [place], [place] [address] [name] of [place] Phone: [phone] Work Phone: [phone] Mobile Phone: [phone] Relation: Spouse Secondary Emergency Contact: Hanley Ben , [place] of [place] Phone: [phone] Relation: Granddaughter  Code Status:  DNR Goals of care: Advanced Directive information    02/10/2022   10:55 AM  Advanced Directives  Does Patient Have a Medical Advance Directive? Yes  Does patient want to make changes to medical advance directive? No - Patient declined     Chief Complaint  Patient presents with   Medical Management of Chronic Issues    Routine follow up visit   Immunizations    COVID booster due  Up to date on booster for covid. Also recently recovered from covid.   HPI:  Pt is a 89 y.o. female seen today for medical management of chronic diseases.    Enrolled in hospice due to PD, dementia, and functional decline.  She has anxiety and depression. Off wellbutrin and lexapro for possible s/e of myoclonic type jerking which improved. Continues with PD symptoms of tremor at baseline. Also has slow movement, rigidity, and dysphonia.   Currently on clonazepam and seroquel for paranoia and anxiety which seem to be helping.   Recently recovered from covid and is slightly weaker per nurse.   MMSE 22/30 06/13/2021. CT head 05/23/21 noted atrophy with chronic small vessel ischemic changes. Off exelon due to cost and lack of benefit  S/p thyroidectomy due to thyroid ca, on synthroid  Lab Results   Component Value Date   TSH 12.50 (A) 01/11/2022  TSH trending down after changing to generic and dose increased.    Hx of bilateral mastectomy with breast reconstruction due to breast ca. No longer  No longer needs mammograms.    Osteoporosis: T score -2.8 07/23/20, previous on reclast per Dr Buddy Duty in 2022.  Past Medical History:  Diagnosis Date   Anxiety    Arthritis    knee & back    Blood type, Rh negative    Breast cancer (HCC)    Chronic fatigue    Depression    Diverticulosis    Endocervical polyp    GERD (gastroesophageal reflux disease)    Hypertension    Hypothyroidism    IBS (irritable bowel syndrome)    Insomnia    Lymphedema    Neuromuscular disorder ([place])    Parkinson's disease    Osteopenia 06/2015   T score -2.4 distal third of the radius   Parkinson disease    RBBB 10/07/2013   Thyroid cancer [facility])    Past Surgical History:  Procedure Laterality Date   APPENDECTOMY  1979   BREAST SURGERY  1979   MASTECTOMY AND RECONSTRUCTION   DILATION AND CURETTAGE OF UTERUS     MISCARRIAGE   EYE SURGERY Bilateral    w/ IOL   HYSTEROSCOPY  2005,2009   AND D&C   MASTECTOMY Bilateral    THYROIDECTOMY     TONSILLECTOMY     TOTAL KNEE ARTHROPLASTY Right 10/07/2013   Procedure: RIGHT TOTAL  KNEE ARTHROPLASTY;  Surgeon: Kerin Salen, MD;  Location: [facility];  Service: Orthopedics;  Laterality: Right;   TUBAL LIGATION      Allergies  Allergen Reactions   Sulfa Antibiotics Rash   Biaxin [Clarithromycin] Other (See Comments)    Pt does not remember reaction    Ciprofloxacin Nausea And Vomiting   Hctz [Hydrochlorothiazide] Other (See Comments)    weakness and hyponatremia   Macrobid [Nitrofurantoin] Nausea Only   Other Nausea Only   Prednisone Other (See Comments)    Swelling and facial redness    Zofran [Ondansetron]    Sulfamethoxazole-Trimethoprim Other (See Comments) and Rash    Sore throat    Allergies as of 02/10/2022       Reactions   Sulfa  Antibiotics Rash   Biaxin [clarithromycin] Other (See Comments)   Pt does not remember reaction   Ciprofloxacin Nausea And Vomiting   Hctz [hydrochlorothiazide] Other (See Comments)   weakness and hyponatremia   Macrobid [nitrofurantoin] Nausea Only   Other Nausea Only   Prednisone Other (See Comments)   Swelling and facial redness   Zofran [ondansetron]    Sulfamethoxazole-trimethoprim Other (See Comments), Rash   Sore throat        Medication List        Accurate as of February 10, 2022 11:09 AM. If you have any questions, ask your nurse or doctor.          STOP taking these medications    aspirin EC 81 MG tablet Stopped by: Royal Hawthorn, NP   NON FORMULARY Stopped by: Royal Hawthorn, NP       TAKE these medications    acetaminophen 500 MG tablet Commonly known as: TYLENOL Take 1,000 mg by mouth every 8 (eight) hours as needed.   carbidopa-levodopa 25-100 MG tablet Commonly known as: SINEMET IR Take 1 tablet by mouth 4 (four) times daily. 0800, 1100, 1500, 2000   Carbidopa-Levodopa ER 25-100 MG tablet controlled release Commonly known as: SINEMET CR Take 1 tablet by mouth at bedtime.   clonazePAM 0.5 MG tablet Commonly known as: KLONOPIN Take 0.5 tablets (0.25 mg total) by mouth 2 (two) times daily.   CRANBERRY-VITAMIN C-D MANNOSE PO Take 1 tablet by mouth 3 (three) times daily.   irbesartan 75 MG tablet Commonly known as: AVAPRO Take 75 mg by mouth every morning.   levothyroxine 125 MCG tablet Commonly known as: SYNTHROID Take 1 tablet (125 mcg total) by mouth daily.   LORazepam 0.5 MG tablet Commonly known as: ATIVAN Take 0.25 mg by mouth daily as needed for anxiety.   multivitamin tablet Take 1 tablet by mouth daily.   polyethylene glycol 17 g packet Commonly known as: MIRALAX / GLYCOLAX Take 17 g by mouth daily.   QUEtiapine 25 MG tablet Commonly known as: SEROquel Take 0.5 tablets (12.5 mg total) by mouth 2 (two) times daily.    senna-docusate 8.6-50 MG tablet Commonly known as: Senokot-S Take 1 tablet by mouth at bedtime.   traMADol 50 MG tablet Commonly known as: ULTRAM Take 50 mg by mouth every 6 (six) hours as needed.   VITAMIN C PO Take 1,000 mg by mouth daily.        Review of Systems  Unable to perform ROS: Dementia    Immunization History  Administered Date(s) Administered   Covid-19, Mrna,Vaccine(Spikevax)42yr and older 11/29/2021   DTP 01/24/1994   Influenza Split 10/17/2008, 10/23/2009, 11/18/2011, 11/21/2012, 12/10/2019   Influenza Whole 10/09/2007   Influenza,inj,Quad PF,6+ Mos 10/26/2010  Influenza-Unspecified 11/05/2013, 10/31/2014, 10/12/2015, 10/20/2016, 11/15/2017, 11/13/2020, 10/29/2021   Moderna SARS-COV2 Booster Vaccination 11/28/2019, 05/28/2020, 11/06/2020   Moderna Sars-Covid-2 Vaccination 01/29/2019, 02/28/2019, 06/28/2021   Pneumococcal Conjugate-13 10/23/2014   Pneumococcal Polysaccharide-23 10/25/2002   Rsv, Bivalent, Protein Subunit Rsvpref,pf [place] Lance) 02/08/2022   Tdap 05/23/2021   Zoster, Live 10/13/2005   Zoster, Unspecified 10/13/2005   Pertinent  Health Maintenance Due  Topic Date Due   INFLUENZA VACCINE  Completed   DEXA SCAN  Completed   PAP SMEAR-Modifier  Discontinued      04/19/2021   10:11 AM 04/19/2021   11:00 PM 04/20/2021   12:00 PM 04/29/2021   12:25 PM 05/23/2021   12:01 PM  Fall Risk  (RETIRED) Patient Fall Risk Level High fall risk High fall risk High fall risk Low fall risk Moderate fall risk   Functional Status Survey:    Vitals:   02/10/22 1046  BP: (!) 146/80  Pulse: 79  Resp: 18  Temp: (!) 96.7 F (35.9 C)  SpO2: 97%  Weight: 120 lb 6.4 oz (54.6 kg)  Height: '5\' 3"'$  (1.6 m)   Body mass index is 21.33 kg/m. Wt Readings from Last 3 Encounters:  02/10/22 120 lb 6.4 oz (54.6 kg)  01/14/22 120 lb 6.4 oz (54.6 kg)  12/13/21 120 lb (54.4 kg)    Physical Exam Vitals and nursing note reviewed.  Constitutional:      General:  She is not in acute distress.    Appearance: She is not diaphoretic.  HENT:     Head: Normocephalic and atraumatic.     Nose: Nose normal.     Mouth/Throat:     Mouth: Mucous membranes are moist.     Pharynx: Oropharynx is clear.  Eyes:     Conjunctiva/sclera: Conjunctivae normal.     Pupils: Pupils are equal, round, and reactive to light.  Neck:     Vascular: No JVD.  Cardiovascular:     Rate and Rhythm: Normal rate and regular rhythm.     Heart sounds: No murmur heard. Pulmonary:     Effort: Pulmonary effort is normal. No respiratory distress.     Breath sounds: Normal breath sounds. No wheezing.  Abdominal:     General: Abdomen is flat. Bowel sounds are normal.     Palpations: Abdomen is soft.  Musculoskeletal:     Right lower leg: No edema.     Left lower leg: No edema.     Comments: Rigidity and decreased ROM to BUE and BLE.   Skin:    General: Skin is warm and dry.  Neurological:     General: No focal deficit present.     Mental Status: She is alert. Mental status is at baseline.  Psychiatric:        Mood and Affect: Mood normal.     Labs reviewed: Recent Labs    04/19/21 1010 04/20/21 0307 04/29/21 1343 07/26/21 0000 09/09/21 0000 09/13/21 0000 10/18/21 0000  NA 138 138 138   < > 138 138 138  K 3.5 3.2* 3.9   < > 3.9 3.7 4.1  CL 106 108 107   < > 102 101 103  CO2 '25 25 26   '$ < > 30* 30* 28*  GLUCOSE 95 98 94  --   --   --   --   BUN 26* 24* 26*   < > 22* 25* 25*  CREATININE 0.88 0.78 0.87   < > 0.9 0.7 0.9  CALCIUM 8.6* 8.5* 8.4*   < >  8.5* 9.0 9.0   < > = values in this interval not displayed.   Recent Labs    04/19/21 1010 04/29/21 1343 09/09/21 0000 09/13/21 0000  AST '15 18 16 17  '$ ALT '9 17 8 8  '$ ALKPHOS 59 59 78 77  BILITOT 1.2 0.6  --   --   PROT 6.1* 5.9*  --   --   ALBUMIN 3.4* 3.3* 3.7 3.9   Recent Labs    04/19/21 1010 04/20/21 0307 04/29/21 1343 07/26/21 0000 09/09/21 0000 09/13/21 0000  WBC 5.4 6.4 6.0 6.5 5.8 6.5  NEUTROABS   --  4.5 4.1  --  4.10  --   HGB 12.7 12.3 12.3 12.5 12.2 12.5  HCT 38.7 36.3 37.8 37 36 37  MCV 96.5 94.0 96.2  --   --   --   PLT 201 202 194 190 186 212   Lab Results  Component Value Date   TSH 12.50 (A) 01/11/2022   Lab Results  Component Value Date   HGBA1C 5.6 10/15/2013   No results found for: "CHOL", "HDL", "LDLCALC", "LDLDIRECT", "TRIG", "CHOLHDL"  Significant Diagnostic Results in last 30 days:  No results found.  Assessment/Plan  1. Parkinson's disease with dyskinesia and fluctuating manifestations Severe  On sinemet Followed by hospice.   2. Hypothyroidism, postsurgical history of thyroid cancer TSH trending down On synthroid TSH due Feb  3. Essential hypertension Controlled  Continue avapro   4. Severe dementia due to Parkinson's disease, with psychotic disturbance ([place]) Improved paranoia with seroquel and clonazepam Needs help with all ADLs Appropriate for skilled care.   5. Generalized weakness Due to covid infection, staff to encourage her to feed herself when able.    Labs/tests ordered:  TSH due in Feb

## 2022-02-26 LAB — TSH: TSH: 18.8 — AB (ref 0.41–5.90)

## 2022-02-28 ENCOUNTER — Telehealth: Payer: Self-pay | Admitting: Adult Health

## 2022-02-28 MED ORDER — LEVOTHYROXINE SODIUM 137 MCG PO TABS: 125.0000 ug | ORAL_TABLET | Freq: Every day | ORAL | 0 refills | Status: DC

## 2022-02-28 NOTE — Telephone Encounter (Signed)
TSH returned 02/26/22 18.8, prior TSH 12.9 01/11/22  Will increase to 137 mcg daily and repeat TSH in 6 weeks

## 2022-03-07 ENCOUNTER — Non-Acute Institutional Stay (SKILLED_NURSING_FACILITY): Admitting: Internal Medicine

## 2022-03-07 DIAGNOSIS — I10 Essential (primary) hypertension: Secondary | ICD-10-CM

## 2022-03-07 DIAGNOSIS — E890 Postprocedural hypothyroidism: Secondary | ICD-10-CM | POA: Diagnosis not present

## 2022-03-07 DIAGNOSIS — G20A1 Parkinson's disease without dyskinesia, without mention of fluctuations: Secondary | ICD-10-CM

## 2022-03-07 DIAGNOSIS — F418 Other specified anxiety disorders: Secondary | ICD-10-CM

## 2022-03-07 DIAGNOSIS — G20B2 Parkinson's disease with dyskinesia, with fluctuations: Secondary | ICD-10-CM | POA: Diagnosis not present

## 2022-03-07 DIAGNOSIS — F02C2 Dementia in other diseases classified elsewhere, severe, with psychotic disturbance: Secondary | ICD-10-CM

## 2022-03-08 ENCOUNTER — Encounter: Payer: Self-pay | Admitting: Internal Medicine

## 2022-03-08 NOTE — Progress Notes (Signed)
Location:  Occupational psychologist of Service:  SNF (31)  Provider: Virgie Dad   Code Status: DNR/Hospice Goals of Care:     02/10/2022   10:55 AM  Advanced Directives  Does Patient Have a Medical Advance Directive? Yes  Does patient want to make changes to medical advance directive? No - Patient declined     Chief Complaint  Patient presents with   Chronic Care Management    HPI: Patient is a 89 y.o. female seen today for medical management of chronic diseases.    Lives in SNF  Enrolled in [place]   She has h/o  Parkinson.   h/o cognitive impairment  Hypertension Hypothyroidism Depression Urinary In continence Osteoporosis Patient has had h/o Dyskinetic movement and slurred speech in 04/23 with MRI and EEG which was negative and  it gets resolved  She  is stable.No new Nursing issues. No Behavior issues Husband in the room said her cognition is worse But anxiety is better She now also has Sitters to help her She is Engineer, drilling dependent Her weight is stable No Falls Wt Readings from Last 3 Encounters:  03/08/22 120 lb 6.4 oz (54.6 kg)  02/10/22 120 lb 6.4 oz (54.6 kg)  01/14/22 120 lb 6.4 oz (54.6 kg)    Past Medical History:  Diagnosis Date   Anxiety    Arthritis    knee & back    Blood type, Rh negative    Breast cancer (HCC)    Chronic fatigue    Depression    Diverticulosis    Endocervical polyp    GERD (gastroesophageal reflux disease)    Hypertension    Hypothyroidism    IBS (irritable bowel syndrome)    Insomnia    Lymphedema    Neuromuscular disorder ([place])    Parkinson's disease    Osteopenia 06/2015   T score -2.4 distal third of the radius   Parkinson disease    RBBB 10/07/2013   Thyroid cancer ([place])     Past Surgical History:  Procedure Laterality Date   APPENDECTOMY  1979   BREAST SURGERY  1979   MASTECTOMY AND RECONSTRUCTION   DILATION AND CURETTAGE OF UTERUS     MISCARRIAGE   EYE SURGERY Bilateral    w/  IOL   HYSTEROSCOPY  2005,2009   AND D&C   MASTECTOMY Bilateral    THYROIDECTOMY     TONSILLECTOMY     TOTAL KNEE ARTHROPLASTY Right 10/07/2013   Procedure: RIGHT TOTAL KNEE ARTHROPLASTY;  Surgeon: Kerin Salen, MD;  Location: [facility];  Service: Orthopedics;  Laterality: Right;   TUBAL LIGATION      Allergies  Allergen Reactions   Sulfa Antibiotics Rash   Biaxin [Clarithromycin] Other (See Comments)    Pt does not remember reaction    Ciprofloxacin Nausea And Vomiting   Hctz [Hydrochlorothiazide] Other (See Comments)    weakness and hyponatremia   Macrobid [Nitrofurantoin] Nausea Only   Other Nausea Only   Prednisone Other (See Comments)    Swelling and facial redness    Zofran [Ondansetron]    Sulfamethoxazole-Trimethoprim Other (See Comments) and Rash    Sore throat    Outpatient Encounter Medications as of 03/07/2022  Medication Sig   acetaminophen (TYLENOL) 500 MG tablet Take 1,000 mg by mouth every 8 (eight) hours as needed.   Ascorbic Acid (VITAMIN C PO) Take 1,000 mg by mouth daily.   carbidopa-levodopa (SINEMET IR) 25-100 MG tablet Take 1 tablet by mouth  4 (four) times daily. 0800, 1100, 1500, 2000   Carbidopa-Levodopa ER (SINEMET CR) 25-100 MG tablet controlled release Take 1 tablet by mouth at bedtime.   clonazePAM (KLONOPIN) 0.5 MG tablet Take 0.5 tablets (0.25 mg total) by mouth 2 (two) times daily.   CRANBERRY-VITAMIN C-D MANNOSE PO Take 1 tablet by mouth 3 (three) times daily.   irbesartan (AVAPRO) 75 MG tablet Take 75 mg by mouth every morning.   levothyroxine (SYNTHROID) 137 MCG tablet Take 1 tablet (137 mcg total) by mouth daily before breakfast.   LORazepam (ATIVAN) 0.5 MG tablet Take 0.25 mg by mouth daily as needed for anxiety.   Multiple Vitamin (MULTIVITAMIN) tablet Take 1 tablet by mouth daily.   polyethylene glycol (MIRALAX / GLYCOLAX) 17 g packet Take 17 g by mouth daily.   QUEtiapine (SEROQUEL) 25 MG tablet Take 0.5 tablets (12.5 mg total) by mouth 2  (two) times daily. (Patient taking differently: Take 12.5 mg by mouth at bedtime.)   senna-docusate (SENOKOT-S) 8.6-50 MG tablet Take 1 tablet by mouth at bedtime.   traMADol (ULTRAM) 50 MG tablet Take 50 mg by mouth every 6 (six) hours as needed.   No facility-administered encounter medications on file as of 03/07/2022.    Review of Systems:  Review of Systems  Constitutional:  Positive for activity change. Negative for appetite change.  HENT: Negative.    Respiratory:  Negative for cough and shortness of breath.   Cardiovascular:  Negative for leg swelling.  Gastrointestinal:  Negative for constipation.  Genitourinary: Negative.   Musculoskeletal:  Positive for gait problem. Negative for arthralgias and myalgias.  Skin: Negative.   Neurological:  Positive for weakness. Negative for dizziness.  Psychiatric/Behavioral:  Positive for confusion. Negative for dysphoric mood and sleep disturbance. The patient is nervous/anxious.     Health Maintenance  Topic Date Due   COVID-19 Vaccine (5 - 2023-24 season) 01/24/2022   Zoster Vaccines- Shingrix (1 of 2) 10/04/2022 (Originally 03/09/1948)   DTaP/Tdap/Td (3 - Td or Tdap) 05/24/2031   Pneumonia Vaccine 68+ Years old  Completed   INFLUENZA VACCINE  Completed   DEXA SCAN  Completed   HPV VACCINES  Aged Out   PAP SMEAR-Modifier  Discontinued    Physical Exam: Vitals:   03/08/22 1220  BP: (!) 158/80  Pulse: 73  Resp: 20  Temp: (!) 97.5 F (36.4 C)  Weight: 120 lb 6.4 oz (54.6 kg)   Body mass index is 21.33 kg/m. Physical Exam Vitals reviewed.  Constitutional:      Appearance: Normal appearance.  HENT:     Head: Normocephalic.     Nose: Nose normal.     Mouth/Throat:     Mouth: Mucous membranes are moist.     Pharynx: Oropharynx is clear.  Eyes:     Pupils: Pupils are equal, round, and reactive to light.  Cardiovascular:     Rate and Rhythm: Normal rate and regular rhythm.     Pulses: Normal pulses.     Heart sounds:  Normal heart sounds. No murmur heard. Pulmonary:     Effort: Pulmonary effort is normal.     Breath sounds: Normal breath sounds.  Abdominal:     General: Abdomen is flat. Bowel sounds are normal.     Palpations: Abdomen is soft.  Musculoskeletal:        General: No swelling.     Cervical back: Neck supple.  Skin:    General: Skin is warm.  Neurological:     Mental Status:  She is alert.     Comments: Her speech still slurry and she gets confused  Psychiatric:        Mood and Affect: Mood normal.        Thought Content: Thought content normal.     Labs reviewed: Basic Metabolic Panel: Recent Labs    04/19/21 1010 04/20/21 0307 04/29/21 1343 07/26/21 0000 09/09/21 0000 09/13/21 0000 10/18/21 0000 10/29/21 0000 01/11/22 0000  NA 138 138 138   < > 138 138 138  --   --   K 3.5 3.2* 3.9   < > 3.9 3.7 4.1  --   --   CL 106 108 107   < > 102 101 103  --   --   CO2 25 25 26   $ < > 30* 30* 28*  --   --   GLUCOSE 95 98 94  --   --   --   --   --   --   BUN 26* 24* 26*   < > 22* 25* 25*  --   --   CREATININE 0.88 0.78 0.87   < > 0.9 0.7 0.9  --   --   CALCIUM 8.6* 8.5* 8.4*   < > 8.5* 9.0 9.0  --   --   TSH  --  1.635  --    < >  --   --  20.30* 34.90* 12.50*   < > = values in this interval not displayed.   Liver Function Tests: Recent Labs    04/19/21 1010 04/29/21 1343 09/09/21 0000 09/13/21 0000  AST 15 18 16 17  $ ALT 9 17 8 8  $ ALKPHOS 59 59 78 77  BILITOT 1.2 0.6  --   --   PROT 6.1* 5.9*  --   --   ALBUMIN 3.4* 3.3* 3.7 3.9   No results for input(s): "LIPASE", "AMYLASE" in the last 8760 hours. Recent Labs    04/19/21 1329  AMMONIA 19   CBC: Recent Labs    04/19/21 1010 04/20/21 0307 04/29/21 1343 07/26/21 0000 09/09/21 0000 09/13/21 0000  WBC 5.4 6.4 6.0 6.5 5.8 6.5  NEUTROABS  --  4.5 4.1  --  4.10  --   HGB 12.7 12.3 12.3 12.5 12.2 12.5  HCT 38.7 36.3 37.8 37 36 37  MCV 96.5 94.0 96.2  --   --   --   PLT 201 202 194 190 186 212   Lipid  Panel: No results for input(s): "CHOL", "HDL", "LDLCALC", "TRIG", "CHOLHDL", "LDLDIRECT" in the last 8760 hours. Lab Results  Component Value Date   HGBA1C 5.6 10/15/2013    Procedures since last visit: No results found.  Assessment/Plan 1. Parkinson's disease with dyskinesia and fluctuating manifestations On Sinemet Now mostly [place] dependent Enrolled in Hospice 2. Hypothyroidism, postsurgical history of thyroid cancer TSH was high in 01/24 Dose was increased to 137 mcg Repeat is pending 3. Essential hypertension Stable on Avapro  4. 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 Depression with anxiety Stable on Klonopin    Labs/tests ordered:    Virgie Dad, MD

## 2022-03-31 ENCOUNTER — Non-Acute Institutional Stay (SKILLED_NURSING_FACILITY): Payer: Medicare Other | Admitting: Adult Health

## 2022-03-31 DIAGNOSIS — G8929 Other chronic pain: Secondary | ICD-10-CM

## 2022-03-31 DIAGNOSIS — G20B2 Parkinson's disease with dyskinesia, with fluctuations: Secondary | ICD-10-CM

## 2022-03-31 DIAGNOSIS — F418 Other specified anxiety disorders: Secondary | ICD-10-CM

## 2022-03-31 DIAGNOSIS — M545 Low back pain, unspecified: Secondary | ICD-10-CM

## 2022-03-31 DIAGNOSIS — K5901 Slow transit constipation: Secondary | ICD-10-CM | POA: Diagnosis not present

## 2022-03-31 DIAGNOSIS — M810 Age-related osteoporosis without current pathological fracture: Secondary | ICD-10-CM

## 2022-03-31 DIAGNOSIS — E890 Postprocedural hypothyroidism: Secondary | ICD-10-CM | POA: Diagnosis not present

## 2022-03-31 DIAGNOSIS — I10 Essential (primary) hypertension: Secondary | ICD-10-CM

## 2022-04-01 ENCOUNTER — Encounter: Payer: Self-pay | Admitting: Adult Health

## 2022-04-01 MED ORDER — CLONAZEPAM 0.5 MG PO TABS: 0.2500 mg | ORAL_TABLET | Freq: Every day | ORAL | 0 refills | Status: DC

## 2022-04-01 MED ORDER — QUETIAPINE FUMARATE 25 MG PO TABS: 12.5000 mg | ORAL_TABLET | Freq: Every day | ORAL | Status: DC

## 2022-04-01 MED ORDER — POLYETHYLENE GLYCOL 3350 17 G PO PACK: 8.5000 g | PACK | Freq: Every day | ORAL | 0 refills | Status: DC

## 2022-04-01 NOTE — Progress Notes (Signed)
Location:   Occupational psychologist of Service:  SNF (31) Provider:  Royal Hawthorn, NP  Virgie Dad, MD  Patient Care Team: Virgie Dad, MD as PCP - General (Internal Medicine) Belva Crome, MD (Inactive) as Consulting Physician (Cardiology)  Extended Emergency Contact Information Primary Emergency Contact: [name] Address: [address]          [place],  [address] [name] of [place] Phone: [phone] Work Phone: [phone] Mobile Phone: [phone] Relation: Spouse Secondary Emergency Contact: Hanley Ben , [place] of [place] Phone: [phone] Relation: Granddaughter  Code Status:  DNR Goals of care: Advanced Directive information    02/10/2022   10:55 AM  Advanced Directives  Does Patient Have a Medical Advance Directive? Yes  Does patient want to make changes to medical advance directive? No - Patient declined     Chief Complaint  Patient presents with   Medical Management of Chronic Issues    HPI:  Pt is a 89 y.o. female seen today for medical management of chronic diseases.    Enrolled in hospice due to PD, dementia, and functional decline.  She has anxiety and depression. Off wellbutrin and lexapro for possible s/e of myoclonic type jerking.  Continues with PD symptoms of tremor at baseline. Also has slow movement, rigidity, and dysphonia.   Currently on clonazepam and seroquel for paranoia and anxiety which seem to be helping. Dose was reduced in Jan due to lethargy.   She is more alert, communicating more.   BP running high at times 160-170s  MMSE 22/30 06/13/2021. CT head 05/23/21 noted atrophy with chronic small vessel ischemic changes. Off exelon due to cost and lack of benefit  S/p thyroidectomy due to thyroid ca, on synthroid dose increased 02/28/22.   Lab Results  Component Value Date   TSH 18.80 (A) 02/26/2022   Periodically has low back pain and  uses prn ultram with benefit.   Constipation: last BM 3/7.  On senokot and miralax.  Hx of bilateral mastectomy with breast reconstruction due to breast ca. No longer  No longer needs mammograms.    Osteoporosis: T score -2.8 07/23/20, previous on reclast per Dr Buddy Duty in 2022.  Past Medical History:  Diagnosis Date   Anxiety    Arthritis    knee & back    Blood type, Rh negative    Breast cancer (HCC)    Chronic fatigue    Depression    Diverticulosis    Endocervical polyp    GERD (gastroesophageal reflux disease)    Hypertension    Hypothyroidism    IBS (irritable bowel syndrome)    Insomnia    Lymphedema    Neuromuscular disorder ([place])    Parkinson's disease    Osteopenia 06/2015   T score -2.4 distal third of the radius   Parkinson disease    RBBB 10/07/2013   Thyroid cancer [facility])    Past Surgical History:  Procedure Laterality Date   APPENDECTOMY  1979   BREAST SURGERY  1979   MASTECTOMY AND RECONSTRUCTION   DILATION AND CURETTAGE OF UTERUS     MISCARRIAGE   EYE SURGERY Bilateral    w/ IOL   HYSTEROSCOPY  2005,2009   AND D&C   MASTECTOMY Bilateral    THYROIDECTOMY     TONSILLECTOMY     TOTAL KNEE ARTHROPLASTY Right 10/07/2013   Procedure: RIGHT TOTAL KNEE ARTHROPLASTY;  Surgeon: Pilar Plate  Karmen Bongo, MD;  Location: [facility];  Service: Orthopedics;  Laterality: Right;   TUBAL LIGATION      Allergies  Allergen Reactions   Sulfa Antibiotics Rash   Biaxin [Clarithromycin] Other (See Comments)    Pt does not remember reaction    Ciprofloxacin Nausea And Vomiting   Hctz [Hydrochlorothiazide] Other (See Comments)    weakness and hyponatremia   Macrobid [Nitrofurantoin] Nausea Only   Other Nausea Only   Prednisone Other (See Comments)    Swelling and facial redness    Zofran [Ondansetron]    Sulfamethoxazole-Trimethoprim Other (See Comments) and Rash    Sore throat    Allergies as of 03/31/2022       Reactions   Sulfa Antibiotics Rash   Biaxin [clarithromycin]  Other (See Comments)   Pt does not remember reaction   Ciprofloxacin Nausea And Vomiting   Hctz [hydrochlorothiazide] Other (See Comments)   weakness and hyponatremia   Macrobid [nitrofurantoin] Nausea Only   Other Nausea Only   Prednisone Other (See Comments)   Swelling and facial redness   Zofran [ondansetron]    Sulfamethoxazole-trimethoprim Other (See Comments), Rash   Sore throat        Medication List        Accurate as of March 31, 2022 11:59 PM. If you have any questions, ask your nurse or doctor.          acetaminophen 500 MG tablet Commonly known as: TYLENOL Take 1,000 mg by mouth every 8 (eight) hours as needed.   carbidopa-levodopa 25-100 MG tablet Commonly known as: SINEMET IR Take 1 tablet by mouth 4 (four) times daily. 0800, 1100, 1500, 2000   Carbidopa-Levodopa ER 25-100 MG tablet controlled release Commonly known as: SINEMET CR Take 1 tablet by mouth at bedtime.   clonazePAM 0.5 MG tablet Commonly known as: KLONOPIN Take 0.5 tablets (0.25 mg total) by mouth 2 (two) times daily.   CRANBERRY-VITAMIN C-D MANNOSE PO Take 1 tablet by mouth 3 (three) times daily.   irbesartan 75 MG tablet Commonly known as: AVAPRO Take 75 mg by mouth every morning.   levothyroxine 137 MCG tablet Commonly known as: Synthroid Take 1 tablet (137 mcg total) by mouth daily before breakfast.   LORazepam 0.5 MG tablet Commonly known as: ATIVAN Take 0.25 mg by mouth daily as needed for anxiety.   multivitamin tablet Take 1 tablet by mouth daily.   polyethylene glycol 17 g packet Commonly known as: MIRALAX / GLYCOLAX Take 17 g by mouth daily.   QUEtiapine 25 MG tablet Commonly known as: SEROquel Take 0.5 tablets (12.5 mg total) by mouth 2 (two) times daily. What changed: when to take this   senna-docusate 8.6-50 MG tablet Commonly known as: Senokot-S Take 1 tablet by mouth at bedtime.   traMADol 50 MG tablet Commonly known as: ULTRAM Take 50 mg by mouth  every 6 (six) hours as needed.   VITAMIN C PO Take 1,000 mg by mouth daily.        Review of Systems  Unable to perform ROS: Dementia    Immunization History  Administered Date(s) Administered   Covid-19, Mrna,Vaccine(Spikevax)3yr and older 11/29/2021   DTP 01/24/1994   Influenza Split 10/17/2008, 10/23/2009, 11/18/2011, 11/21/2012, 12/10/2019   Influenza Whole 10/09/2007   Influenza,inj,Quad PF,6+ Mos 10/26/2010   Influenza-Unspecified 11/05/2013, 10/31/2014, 10/12/2015, 10/20/2016, 11/15/2017, 11/13/2020, 10/29/2021   Moderna SARS-COV2 Booster Vaccination 11/28/2019, 05/28/2020, 11/06/2020   Moderna Sars-Covid-2 Vaccination 01/29/2019, 02/28/2019, 06/28/2021   Pneumococcal Conjugate-13 10/23/2014   Pneumococcal  Polysaccharide-23 10/25/2002   Rsv, Bivalent, Protein Subunit Rsvpref,pf [facility] Lance) 02/08/2022   Tdap 05/23/2021   Zoster, Live 10/13/2005   Zoster, Unspecified 10/13/2005   Pertinent  Health Maintenance Due  Topic Date Due   INFLUENZA VACCINE  Completed   DEXA SCAN  Completed   PAP SMEAR-Modifier  Discontinued      04/19/2021   10:11 AM 04/19/2021   11:00 PM 04/20/2021   12:00 PM 04/29/2021   12:25 PM 05/23/2021   12:01 PM  Fall Risk  (RETIRED) Patient Fall Risk Level High fall risk High fall risk High fall risk Low fall risk Moderate fall risk   Functional Status Survey:    Vitals:   04/01/22 1121  Weight: 123 lb 9.6 oz (56.1 kg)   Body mass index is 21.89 kg/m. Wt Readings from Last 3 Encounters:  04/01/22 123 lb 9.6 oz (56.1 kg)  03/08/22 120 lb 6.4 oz (54.6 kg)  02/10/22 120 lb 6.4 oz (54.6 kg)    Physical Exam Vitals and nursing note reviewed.  Constitutional:      General: She is not in acute distress.    Appearance: She is not diaphoretic.  HENT:     Head: Normocephalic and atraumatic.     Nose: Nose normal.     Mouth/Throat:     Mouth: Mucous membranes are moist.     Pharynx: Oropharynx is clear.  Eyes:     Conjunctiva/sclera:  Conjunctivae normal.     Pupils: Pupils are equal, round, and reactive to light.  Neck:     Vascular: No JVD.  Cardiovascular:     Rate and Rhythm: Normal rate and regular rhythm.     Heart sounds: No murmur heard. Pulmonary:     Effort: Pulmonary effort is normal. No respiratory distress.     Breath sounds: Normal breath sounds. No wheezing.  Abdominal:     General: Abdomen is flat. Bowel sounds are normal.     Palpations: Abdomen is soft.  Musculoskeletal:     Right lower leg: No edema.     Left lower leg: No edema.     Comments: Rigidity and decreased ROM to BUE and BLE.   Skin:    General: Skin is warm and dry.  Neurological:     General: No focal deficit present.     Mental Status: She is alert. Mental status is at baseline.  Psychiatric:        Mood and Affect: Mood normal.     Labs reviewed: Recent Labs    04/19/21 1010 04/20/21 0307 04/29/21 1343 07/26/21 0000 09/09/21 0000 09/13/21 0000 10/18/21 0000  NA 138 138 138   < > 138 138 138  K 3.5 3.2* 3.9   < > 3.9 3.7 4.1  CL 106 108 107   < > 102 101 103  CO2 '25 25 26   '$ < > 30* 30* 28*  GLUCOSE 95 98 94  --   --   --   --   BUN 26* 24* 26*   < > 22* 25* 25*  CREATININE 0.88 0.78 0.87   < > 0.9 0.7 0.9  CALCIUM 8.6* 8.5* 8.4*   < > 8.5* 9.0 9.0   < > = values in this interval not displayed.    Recent Labs    04/19/21 1010 04/29/21 1343 09/09/21 0000 09/13/21 0000  AST '15 18 16 17  '$ ALT '9 17 8 8  '$ ALKPHOS 59 59 78 77  BILITOT 1.2 0.6  --   --  PROT 6.1* 5.9*  --   --   ALBUMIN 3.4* 3.3* 3.7 3.9    Recent Labs    04/19/21 1010 04/20/21 0307 04/29/21 1343 07/26/21 0000 09/09/21 0000 09/13/21 0000  WBC 5.4 6.4 6.0 6.5 5.8 6.5  NEUTROABS  --  4.5 4.1  --  4.10  --   HGB 12.7 12.3 12.3 12.5 12.2 12.5  HCT 38.7 36.3 37.8 37 36 37  MCV 96.5 94.0 96.2  --   --   --   PLT 201 202 194 190 186 212    Lab Results  Component Value Date   TSH 18.80 (A) 02/26/2022   Lab Results  Component Value  Date   HGBA1C 5.6 10/15/2013   No results found for: "CHOL", "HDL", "LDLCALC", "LDLDIRECT", "TRIG", "CHOLHDL"  Significant Diagnostic Results in last 30 days:  No results found.  Assessment/Plan  1. Parkinson's disease with dyskinesia and fluctuating manifestations Severe  On sinemet Followed by hospice.   2. Hypothyroidism, postsurgical history of thyroid cancer Dose increased 2/5, repeat TSH due in 6 weeks from that time.   3. Essential hypertension Above goal taken manual today 172/82 Increase Avapro to 1.5 tabs daily  4. Severe dementia due to Parkinson's disease, with psychotic disturbance ([place]) Improved paranoia with seroquel and clonazepam Needs help with all ADLs Appropriate for skilled care.   5. Constipation Continue miralax and senokot  6. Osteoporosis No longer receiving treatment due to hospice status and non weight bearing  7. Low Back pain Due arthritis issues.  Using ultram prn, would not taper   Labs/tests ordered:  TSH due in March

## 2022-04-11 LAB — TSH: TSH: 16.2 — AB (ref 0.41–5.90)

## 2022-04-14 ENCOUNTER — Telehealth: Payer: Self-pay | Admitting: Adult Health

## 2022-04-14 MED ORDER — LEVOTHYROXINE SODIUM 150 MCG PO TABS: 150.0000 ug | ORAL_TABLET | Freq: Every day | ORAL | Status: AC

## 2022-04-14 NOTE — Telephone Encounter (Signed)
TSH returned at 16.2. Will increase levothyroxine to 150 mcg and recheck TSH in 6 weeks.

## 2022-04-22 ENCOUNTER — Other Ambulatory Visit: Payer: Self-pay | Admitting: Adult Health

## 2022-04-22 DIAGNOSIS — I10 Essential (primary) hypertension: Secondary | ICD-10-CM

## 2022-04-22 MED ORDER — HYDRALAZINE HCL 25 MG PO TABS: 25.0000 mg | ORAL_TABLET | ORAL | 0 refills | Status: DC

## 2022-04-22 NOTE — Progress Notes (Signed)
Bp 178/95 hydralazine 25 mg po x 1 ordered.

## 2022-04-25 ENCOUNTER — Non-Acute Institutional Stay (SKILLED_NURSING_FACILITY): Payer: Medicare Other | Admitting: Internal Medicine

## 2022-04-25 ENCOUNTER — Encounter: Payer: Self-pay | Admitting: Internal Medicine

## 2022-04-25 DIAGNOSIS — I10 Essential (primary) hypertension: Secondary | ICD-10-CM

## 2022-04-25 DIAGNOSIS — G20B2 Parkinson's disease with dyskinesia, with fluctuations: Secondary | ICD-10-CM | POA: Diagnosis not present

## 2022-04-25 DIAGNOSIS — F418 Other specified anxiety disorders: Secondary | ICD-10-CM | POA: Diagnosis not present

## 2022-04-25 DIAGNOSIS — R296 Repeated falls: Secondary | ICD-10-CM | POA: Insufficient documentation

## 2022-04-25 DIAGNOSIS — E890 Postprocedural hypothyroidism: Secondary | ICD-10-CM

## 2022-04-25 NOTE — Progress Notes (Signed)
Location: [facility] Room Number: 130A Place of Service:  SNF (31)  Provider:   Code Status: DNR/Hospice Goals of Care:     04/25/2022    4:22 PM  Advanced Directives  Does Patient Have a Medical Advance Directive? Yes  Type of Advance Directive Out of facility DNR (pink MOST or yellow form)  Does patient want to make changes to medical advance directive? No - Patient declined     Chief Complaint  Patient presents with   Acute Visit    Patient is being seen for elevated BPS    HPI: Patient is a 89 y.o. female seen today for an acute visit for Elevated BP Lives in SNF  Enrolled in Hospice   Patient's blood pressure has been recently running high.  Husband thinks that the hospital is ignoring her.  Nurses are getting some of the blood pressures more than 180.  She does have an order for as needed hydralazine. Patient is unable to give any history She has become now nonambulatory She used to have orthostatic hypotension. Patient also sleeps a lot per husband Her speech is becoming more slurred and incoherent  She has h/o  Parkinson.   h/o cognitive impairment  Hypertension Hypothyroidism Depression Urinary In continence Osteoporosis Patient has had h/o Dyskinetic movement and slurred speech in 04/23 with MRI and EEG which was negative and  it gets resolved  Past Medical History:  Diagnosis Date   Anxiety    Arthritis    knee & back    Blood type, Rh negative    Breast cancer    Chronic fatigue    Depression    Diverticulosis    Endocervical polyp    GERD (gastroesophageal reflux disease)    Hypertension    Hypothyroidism    IBS (irritable bowel syndrome)    Insomnia    Lymphedema    Neuromuscular disorder    Parkinson's disease    Osteopenia 06/2015   T score -2.4 distal third of the radius   Parkinson disease    RBBB 10/07/2013   Thyroid cancer     Past Surgical History:  Procedure Laterality Date   APPENDECTOMY   1979   BREAST SURGERY  1979   MASTECTOMY AND RECONSTRUCTION   DILATION AND CURETTAGE OF UTERUS     MISCARRIAGE   EYE SURGERY Bilateral    w/ IOL   HYSTEROSCOPY  2005,2009   AND D&C   MASTECTOMY Bilateral    THYROIDECTOMY     TONSILLECTOMY     TOTAL KNEE ARTHROPLASTY Right 10/07/2013   Procedure: RIGHT TOTAL KNEE ARTHROPLASTY;  Surgeon: Nestor Lewandowsky, MD;  Location: [facility] OR;  Service: Orthopedics;  Laterality: Right;   TUBAL LIGATION      Allergies  Allergen Reactions   Sulfa Antibiotics Rash   Biaxin [Clarithromycin] Other (See Comments)    Pt does not remember reaction    Ciprofloxacin Nausea And Vomiting   Hctz [Hydrochlorothiazide] Other (See Comments)    weakness and hyponatremia   Macrobid [Nitrofurantoin] Nausea Only   Other Nausea Only   Prednisone Other (See Comments)    Swelling and facial redness    Zofran [Ondansetron]    Sulfamethoxazole-Trimethoprim Other (See Comments) and Rash    Sore throat    Outpatient Encounter Medications as of 04/25/2022  Medication Sig   acetaminophen (TYLENOL) 500 MG tablet Take 1,000 mg by mouth every 8 (eight) hours as needed.   Ascorbic Acid (VITAMIN C PO) Take 1,000  mg by mouth daily.   carbidopa-levodopa (SINEMET IR) 25-100 MG tablet Take 1 tablet by mouth 4 (four) times daily. 0800, 1100, 1500, 2000   Carbidopa-Levodopa ER (SINEMET CR) 25-100 MG tablet controlled release Take 1 tablet by mouth at bedtime.   clonazePAM (KLONOPIN) 0.5 MG tablet Take 0.5 tablets (0.25 mg total) by mouth at bedtime.   CRANBERRY-VITAMIN C-D MANNOSE PO Take 1 tablet by mouth 3 (three) times daily.   irbesartan (AVAPRO) 75 MG tablet Take 1.5 tablets by mouth every morning.   levothyroxine (SYNTHROID) 150 MCG tablet Take 1 tablet (150 mcg total) by mouth daily before breakfast.   LORazepam (ATIVAN) 0.5 MG tablet Take 0.25 mg by mouth daily as needed for anxiety.   Multiple Vitamin (MULTIVITAMIN) tablet Take 1 tablet by mouth daily.   polyethylene glycol  (MIRALAX / GLYCOLAX) 17 g packet Take 8.5 g by mouth daily.   QUEtiapine (SEROQUEL) 25 MG tablet Take 0.5 tablets (12.5 mg total) by mouth at bedtime.   senna-docusate (SENOKOT-S) 8.6-50 MG tablet Take 1 tablet by mouth at bedtime.   traMADol (ULTRAM) 50 MG tablet Take 50 mg by mouth every 6 (six) hours as needed.   hydrALAZINE (APRESOLINE) 25 MG tablet Take 1 tablet (25 mg total) by mouth now for 1 dose.   No facility-administered encounter medications on file as of 04/25/2022.    Review of Systems:  Review of Systems  Unable to perform ROS: Dementia    Health Maintenance  Topic Date Due   COVID-19 Vaccine (5 - 2023-24 season) 01/24/2022   Zoster Vaccines- Shingrix (1 of 2) 10/04/2022 (Originally 03/09/1948)   INFLUENZA VACCINE  08/25/2022   DTaP/Tdap/Td (3 - Td or Tdap) 05/24/2031   Pneumonia Vaccine 77+ Years old  Completed   DEXA SCAN  Completed   HPV VACCINES  Aged Out   PAP SMEAR-Modifier  Discontinued    Physical Exam: Vitals:   04/25/22 1618  BP: 110/70  Pulse: 81  Resp: 16  Temp: (!) 96.8 F (36 C)  TempSrc: Temporal  SpO2: 90%  Weight: 123 lb 12.8 oz (56.2 kg)  Height:  (1.6 m)   Body mass index is 21.93 kg/m. Physical Exam Vitals reviewed.  Constitutional:      Appearance: Normal appearance.     Comments: Was sleeping  HENT:     Head: Normocephalic.     Nose: Nose normal.     Mouth/Throat:     Mouth: Mucous membranes are moist.     Pharynx: Oropharynx is clear.  Eyes:     Pupils: Pupils are equal, round, and reactive to light.  Cardiovascular:     Rate and Rhythm: Normal rate and regular rhythm.     Pulses: Normal pulses.     Heart sounds: Normal heart sounds. No murmur heard. Pulmonary:     Effort: Pulmonary effort is normal.     Breath sounds: Normal breath sounds.  Abdominal:     General: Abdomen is flat. Bowel sounds are normal.     Palpations: Abdomen is soft.  Musculoskeletal:        General: No swelling.     Cervical back: Neck  supple.  Skin:    General: Skin is warm.  Psychiatric:        Mood and Affect: Mood normal.        Thought Content: Thought content normal.     Labs reviewed: Basic Metabolic Panel: Recent Labs    09/09/21 0000 09/13/21 0000 10/18/21 0000 10/29/21 0000 01/11/22  0000 02/26/22 0000 04/11/22 0000  NA 138 138 138  --   --   --   --   K 3.9 3.7 4.1  --   --   --   --   CL 102 101 103  --   --   --   --   CO2 30* 30* 28*  --   --   --   --   BUN 22* 25* 25*  --   --   --   --   CREATININE 0.9 0.7 0.9  --   --   --   --   CALCIUM 8.5* 9.0 9.0  --   --   --   --   TSH  --   --  20.30*   < > 12.50* 18.80* 16.20*   < > = values in this interval not displayed.   Liver Function Tests: Recent Labs    09/09/21 0000 09/13/21 0000  AST 16 17  ALT 8 8  ALKPHOS 78 77  ALBUMIN 3.7 3.9   No results for input(s): "LIPASE", "AMYLASE" in the last 8760 hours. No results for input(s): "AMMONIA" in the last 8760 hours. CBC: Recent Labs    07/26/21 0000 09/09/21 0000 09/13/21 0000  WBC 6.5 5.8 6.5  NEUTROABS  --  4.10  --   HGB 12.5 12.2 12.5  HCT 37 36 37  PLT 190 186 212   Lipid Panel: No results for input(s): "CHOL", "HDL", "LDLCALC", "TRIG", "CHOLHDL", "LDLDIRECT" in the last 8760 hours. Lab Results  Component Value Date   HGBA1C 5.6 10/15/2013    Procedures since last visit: No results found.  Assessment/Plan 1. Essential hypertension Change irbesartan to 150 mg QD Start on Norvasc 2.5 mg QD Check BP BID Hydralazine 10 mg QD if SBP more then 180 2. Parkinson's disease with dyskinesia and fluctuating manifestations Now enrolled in Hospice On Sinemet Hoyer dependent  3. Depression with anxiety On Klonopin  4. Hypothyroidism, postsurgical history of thyroid cancer Synthroid just changed as TSH was high    Labs/tests ordered:  * No order type specified * Next appt:  Visit date not found

## 2022-05-05 ENCOUNTER — Encounter: Payer: Self-pay | Admitting: Adult Health

## 2022-05-05 ENCOUNTER — Non-Acute Institutional Stay (SKILLED_NURSING_FACILITY): Payer: Medicare Other | Admitting: Adult Health

## 2022-05-05 DIAGNOSIS — E890 Postprocedural hypothyroidism: Secondary | ICD-10-CM

## 2022-05-05 DIAGNOSIS — I10 Essential (primary) hypertension: Secondary | ICD-10-CM | POA: Diagnosis not present

## 2022-05-05 DIAGNOSIS — G20A1 Parkinson's disease without dyskinesia, without mention of fluctuations: Secondary | ICD-10-CM | POA: Diagnosis not present

## 2022-05-05 DIAGNOSIS — G20B2 Parkinson's disease with dyskinesia, with fluctuations: Secondary | ICD-10-CM | POA: Diagnosis not present

## 2022-05-05 DIAGNOSIS — F02C2 Dementia in other diseases classified elsewhere, severe, with psychotic disturbance: Secondary | ICD-10-CM

## 2022-05-05 NOTE — Progress Notes (Signed)
Location:  [facility] Room Number: 130A Place of Service:  SNF [phone]) Provider:  Fletcher Anon, NP  Mahlon Gammon, MD  Patient Care Team: Mahlon Gammon, MD as PCP - General (Internal Medicine) Lyn Records, MD (Inactive) as Consulting Physician (Cardiology)  Extended Emergency Contact Information Primary Emergency Contact: Jacquelynn Cree Address: [address]          [place], [place] [address] [place] Home Phone: [phone] Work Phone: [phone] Mobile Phone: [phone] Relation: Spouse Secondary Emergency Contact: Darcella Cheshire , [place] Home Phone: [phone] Relation: Granddaughter  Code Status:  DNR Hospice Goals of care: Advanced Directive information    05/05/2022   10:10 AM  Advanced Directives  Does Patient Have a Medical Advance Directive? Yes  Type of Advance Directive Out of facility DNR (pink MOST or yellow form)  Does patient want to make changes to medical advance directive? No - Patient declined     Chief Complaint  Patient presents with   Medical Management of Chronic Issues    Patient being seen for routine     HPI:  Pt is a 89 y.o. female seen today for medical management of chronic diseases.    Enrolled in hospice due to PD, dementia, and functional decline.  She has anxiety and depression. Off wellbutrin and lexapro for possible s/e of myoclonic type jerking.  Continues with PD symptoms of tremor at baseline. Also has slow movement, rigidity, and dysphonia.   Seen by Dr Chales Abrahams for HTN Norvasc started and dose increased. Avapro also increased. BP is improving, 153/89 110/73 145/82  Currently on clonazepam and seroquel for paranoia and anxiety which seem to be helping. Also has prn ativan for periods of anxiety and crying which helps per nurse.   MMSE 22/30 06/13/2021. CT head 05/23/21 noted atrophy with chronic small vessel ischemic  changes. Off exelon due to cost and lack of benefit Seems to be doing better, communicating more these past few months.   S/p thyroidectomy due to thyroid ca, on synthroid. She changed from brand name to generic and has had difficulty getting her TSH to goal since that time on escalating dosing.  Lab Results  Component Value Date   TSH 16.20 (A) 04/11/2022    Past Medical History:  Diagnosis Date   Anxiety    Arthritis    knee & back    Blood type, Rh negative    Breast cancer    Chronic fatigue    Depression    Diverticulosis    Endocervical polyp    GERD (gastroesophageal reflux disease)    Hypertension    Hypothyroidism    IBS (irritable bowel syndrome)    Insomnia    Lymphedema    Neuromuscular disorder    Parkinson's disease    Osteopenia 06/2015   T score -2.4 distal third of the radius   Parkinson disease    RBBB 10/07/2013   Thyroid cancer    Past Surgical History:  Procedure Laterality Date   APPENDECTOMY  1979   BREAST SURGERY  1979   MASTECTOMY AND RECONSTRUCTION   DILATION AND CURETTAGE OF UTERUS     MISCARRIAGE   EYE SURGERY Bilateral    w/ IOL   HYSTEROSCOPY  2005,2009   AND D&C   MASTECTOMY Bilateral    THYROIDECTOMY     TONSILLECTOMY     TOTAL KNEE ARTHROPLASTY Right 10/07/2013  Procedure: RIGHT TOTAL KNEE ARTHROPLASTY;  Surgeon: Nestor LewandowskyFrank J Rowan, MD;  Location: [facility] OR;  Service: Orthopedics;  Laterality: Right;   TUBAL LIGATION      Allergies  Allergen Reactions   Sulfa Antibiotics Rash   Biaxin [Clarithromycin] Other (See Comments)    Pt does not remember reaction    Ciprofloxacin Nausea And Vomiting   Hctz [Hydrochlorothiazide] Other (See Comments)    weakness and hyponatremia   Macrobid [Nitrofurantoin] Nausea Only   Other Nausea Only   Prednisone Other (See Comments)    Swelling and facial redness    Zofran [Ondansetron]    Sulfamethoxazole-Trimethoprim Other (See Comments) and Rash    Sore throat    Outpatient Encounter  Medications as of 05/05/2022  Medication Sig   acetaminophen (TYLENOL) 500 MG tablet Take 1,000 mg by mouth every 8 (eight) hours as needed.   amLODipine (NORVASC) 5 MG tablet Take 5 mg by mouth daily.   carbidopa-levodopa (SINEMET IR) 25-100 MG tablet Take 1 tablet by mouth 4 (four) times daily. 0800, 1100, 1500, 2000   Carbidopa-Levodopa ER (SINEMET CR) 25-100 MG tablet controlled release Take 1 tablet by mouth at bedtime.   clonazePAM (KLONOPIN) 0.5 MG tablet Take 0.5 tablets (0.25 mg total) by mouth at bedtime.   CRANBERRY-VITAMIN C-D MANNOSE PO Take 1 tablet by mouth 3 (three) times daily.   hydrALAZINE (APRESOLINE) 10 MG tablet Take 10 mg by mouth daily as needed. For SBP more then 180   irbesartan (AVAPRO) 150 MG tablet Take 150 mg by mouth daily.   levothyroxine (SYNTHROID) 150 MCG tablet Take 1 tablet (150 mcg total) by mouth daily before breakfast.   LORazepam (ATIVAN) 0.5 MG tablet Take 0.25 mg by mouth daily as needed for anxiety.   Multiple Vitamin (MULTIVITAMIN) tablet Take 1 tablet by mouth daily.   polyethylene glycol (MIRALAX / GLYCOLAX) 17 g packet Take 8.5 g by mouth daily.   QUEtiapine (SEROQUEL) 25 MG tablet Take 0.5 tablets (12.5 mg total) by mouth at bedtime.   senna-docusate (SENOKOT-S) 8.6-50 MG tablet Take 1 tablet by mouth at bedtime.   traMADol (ULTRAM) 50 MG tablet Take 50 mg by mouth every 6 (six) hours as needed.   [DISCONTINUED] irbesartan (AVAPRO) 75 MG tablet Take 2 tablets by mouth every morning.   Ascorbic Acid (VITAMIN C PO) Take 1,000 mg by mouth daily. (Patient not taking: Reported on 05/05/2022)   [DISCONTINUED] amLODipine (NORVASC) 2.5 MG tablet Take 2.5 mg by mouth daily.   No facility-administered encounter medications on file as of 05/05/2022.    Review of Systems  Constitutional:  Negative for activity change, appetite change, chills, diaphoresis, fatigue, fever and unexpected weight change.  HENT:  Negative for congestion.   Respiratory:  Negative  for cough, shortness of breath and wheezing.   Cardiovascular:  Negative for chest pain, palpitations and leg swelling.  Gastrointestinal:  Negative for abdominal distention, abdominal pain, constipation and diarrhea.  Genitourinary:  Negative for difficulty urinating and dysuria.  Musculoskeletal:  Positive for back pain (intermittent chronic) and gait problem. Negative for arthralgias, joint swelling and myalgias.  Neurological:  Negative for dizziness, tremors, seizures, syncope, facial asymmetry, speech difficulty, weakness, light-headedness, numbness and headaches.       Slow movement dysphonia, rigidity   Psychiatric/Behavioral:  Positive for confusion and dysphoric mood. Negative for agitation and behavioral problems. The patient is nervous/anxious.     Immunization History  Administered Date(s) Administered   Covid-19, Mrna,Vaccine(Spikevax)112yrs and older 11/29/2021   DTP 01/24/1994  Influenza Split 10/09/2007, 10/17/2008, 10/23/2009, 11/18/2011, 11/21/2012, 12/10/2019   Influenza Whole 10/09/2007   Influenza,inj,Quad PF,6+ Mos 10/26/2010   Influenza-Unspecified 11/05/2013, 10/31/2014, 10/12/2015, 10/20/2016, 11/15/2017, 11/13/2020, 10/29/2021   Moderna SARS-COV2 Booster Vaccination 11/28/2019, 05/28/2020, 11/06/2020   Moderna Sars-Covid-2 Vaccination 01/29/2019, 02/28/2019, 06/28/2021   Pneumococcal Conjugate-13 10/23/2014   Pneumococcal Polysaccharide-23 10/25/2002   Rsv, Bivalent, Protein Subunit Rsvpref,pf Verdis Frederickson) 02/08/2022   Tdap 05/23/2021   Zoster, Live 10/13/2005   Zoster, Unspecified 10/13/2005   Pertinent  Health Maintenance Due  Topic Date Due   INFLUENZA VACCINE  08/25/2022   DEXA SCAN  Completed   PAP SMEAR-Modifier  Discontinued      04/19/2021   10:11 AM 04/19/2021   11:00 PM 04/20/2021   12:00 PM 04/29/2021   12:25 PM 05/23/2021   12:01 PM  Fall Risk  (RETIRED) Patient Fall Risk Level High fall risk High fall risk High fall risk Low fall risk Moderate  fall risk   Functional Status Survey:    Vitals:   05/05/22 0959  BP: 111/70  Pulse: 73  Resp: 16  Temp: (!) 97 F (36.1 C)  TempSrc: Temporal  SpO2: 99%  Weight: 123 lb 12.8 oz (56.2 kg)  Height: 5\' 3"  (1.6 m)   Body mass index is 21.93 kg/m. Wt Readings from Last 3 Encounters:  05/05/22 123 lb 12.8 oz (56.2 kg)  04/25/22 123 lb 12.8 oz (56.2 kg)  04/01/22 123 lb 9.6 oz (56.1 kg)    Physical Exam Vitals and nursing note reviewed.  Constitutional:      General: She is not in acute distress.    Appearance: She is not diaphoretic.  HENT:     Head: Normocephalic and atraumatic.     Right Ear: Tympanic membrane and ear canal normal.     Left Ear: Tympanic membrane and ear canal normal.     Mouth/Throat:     Mouth: Mucous membranes are moist.     Pharynx: Oropharynx is clear.  Neck:     Vascular: No JVD.  Cardiovascular:     Rate and Rhythm: Normal rate and regular rhythm.     Heart sounds: No murmur heard. Pulmonary:     Effort: Pulmonary effort is normal. No respiratory distress.     Breath sounds: Normal breath sounds. No wheezing.  Abdominal:     General: Bowel sounds are normal. There is no distension.     Palpations: Abdomen is soft.     Tenderness: There is no abdominal tenderness.  Musculoskeletal:     Right lower leg: No edema.     Left lower leg: No edema.  Skin:    General: Skin is warm and dry.  Neurological:     General: No focal deficit present.     Mental Status: She is alert. Mental status is at baseline.     Comments: Slow movement, rigidity, decreased ROM to BUE and BLE  Psychiatric:        Mood and Affect: Mood normal.     Labs reviewed: Recent Labs    09/09/21 0000 09/13/21 0000 10/18/21 0000  NA 138 138 138  K 3.9 3.7 4.1  CL 102 101 103  CO2 30* 30* 28*  BUN 22* 25* 25*  CREATININE 0.9 0.7 0.9  CALCIUM 8.5* 9.0 9.0   Recent Labs    09/09/21 0000 09/13/21 0000  AST 16 17  ALT 8 8  ALKPHOS 78 77  ALBUMIN 3.7 3.9    Recent Labs    07/26/21 0000  09/09/21 0000 09/13/21 0000  WBC 6.5 5.8 6.5  NEUTROABS  --  4.10  --   HGB 12.5 12.2 12.5  HCT 37 36 37  PLT 190 186 212   Lab Results  Component Value Date   TSH 16.20 (A) 04/11/2022   Lab Results  Component Value Date   HGBA1C 5.6 10/15/2013   No results found for: "CHOL", "HDL", "LDLCALC", "LDLDIRECT", "TRIG", "CHOLHDL"  Significant Diagnostic Results in last 30 days:  No results found.  Assessment/Plan  1. Hypothyroidism, postsurgical history of thyroid cancer Difficult to get TSH to goal with generic med however she is not having any symptoms.  Dose adjusted in march next TSH in May  2. Essential hypertension Improving Continue norvasc and Avapro   3. Parkinson's disease with dyskinesia and fluctuating manifestations Severe followed by hospice Continues on sinemet  4. 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

## 2022-05-26 LAB — TSH: TSH: 4.8 (ref 0.41–5.90)

## 2022-06-09 ENCOUNTER — Encounter: Payer: Self-pay | Admitting: Adult Health

## 2022-06-09 ENCOUNTER — Non-Acute Institutional Stay (SKILLED_NURSING_FACILITY): Payer: Medicare Other | Admitting: Adult Health

## 2022-06-09 DIAGNOSIS — E890 Postprocedural hypothyroidism: Secondary | ICD-10-CM | POA: Diagnosis not present

## 2022-06-09 DIAGNOSIS — G20A1 Parkinson's disease without dyskinesia, without mention of fluctuations: Secondary | ICD-10-CM | POA: Diagnosis not present

## 2022-06-09 DIAGNOSIS — K5901 Slow transit constipation: Secondary | ICD-10-CM | POA: Diagnosis not present

## 2022-06-09 DIAGNOSIS — G20B2 Parkinson's disease with dyskinesia, with fluctuations: Secondary | ICD-10-CM

## 2022-06-09 DIAGNOSIS — M5137 Other intervertebral disc degeneration, lumbosacral region: Secondary | ICD-10-CM

## 2022-06-09 DIAGNOSIS — F02C2 Dementia in other diseases classified elsewhere, severe, with psychotic disturbance: Secondary | ICD-10-CM

## 2022-06-09 DIAGNOSIS — I10 Essential (primary) hypertension: Secondary | ICD-10-CM | POA: Diagnosis not present

## 2022-06-09 NOTE — Progress Notes (Signed)
Location:  [facility] of Service:  SNF (31) Provider:  Fletcher Anon, NP  Mahlon Gammon, MD  Patient Care Team: Mahlon Gammon, MD as PCP - General (Internal Medicine) Lyn Records, MD (Inactive) as Consulting Physician (Cardiology)  Extended Emergency Contact Information Primary Emergency Contact: Jacquelynn Cree Address: [address]          [place], [place] [address] [place] of [place] Home Phone: [phone] Work Phone: [phone] Mobile Phone: [phone] Relation: Spouse Secondary Emergency Contact: Darcella Cheshire , [place] Home Phone: [phone] Relation: Granddaughter  Code Status:  DNR Hospice Goals of care: Advanced Directive information    05/05/2022   10:10 AM  Advanced Directives  Does Patient Have a Medical Advance Directive? Yes  Type of Advance Directive Out of facility DNR (pink MOST or yellow form)  Does patient want to make changes to medical advance directive? No - Patient declined     Chief Complaint  Patient presents with   Medical Management of Chronic Issues    HPI:  Pt is a 89 y.o. female seen today for medical management of chronic diseases.    Enrolled in hospice due to PD, dementia, and functional decline.  She has anxiety and depression.  Continues with PD symptoms of tremor at baseline. Also has slow movement, rigidity, and dysphonia.   BP controlled now on norvasc and avapro  Currently on clonazepam and seroquel for paranoia and anxiety which seem to be helping. Also has prn ativan for periods of anxiety and crying which helps per nurse.   MMSE 22/30 06/13/2021. CT head 05/23/21 noted atrophy with chronic small vessel ischemic changes. Off exelon due to cost and lack of benefit She is able to communicate. Needs help with feeding and other ADls. Using a lift for transfer and wears briefs due to incontinence.   S/p thyroidectomy due to thyroid  ca, on synthroid. Change to generic. TSH acceptable now.  Lab Results  Component Value Date   TSH 4.80 05/26/2022   Has intermittent low back pain, not currently an issue  On miralax and senokot, BMs recorded regularly Past Medical History:  Diagnosis Date   Anxiety    Arthritis    knee & back    Blood type, Rh negative    Breast cancer (HCC)    Chronic fatigue    Depression    Diverticulosis    Endocervical polyp    GERD (gastroesophageal reflux disease)    Hypertension    Hypothyroidism    IBS (irritable bowel syndrome)    Insomnia    Lymphedema    Neuromuscular disorder (HCC)    Parkinson's disease    Osteopenia 06/2015   T score -2.4 distal third of the radius   Parkinson disease    RBBB 10/07/2013   Thyroid cancer [facility])    Past Surgical History:  Procedure Laterality Date   APPENDECTOMY  1979   BREAST SURGERY  1979   MASTECTOMY AND RECONSTRUCTION   DILATION AND CURETTAGE OF UTERUS     MISCARRIAGE   EYE SURGERY Bilateral    w/ IOL   HYSTEROSCOPY  2005,2009   AND D&C   MASTECTOMY Bilateral    THYROIDECTOMY     TONSILLECTOMY     TOTAL KNEE ARTHROPLASTY Right 10/07/2013   Procedure: RIGHT TOTAL KNEE ARTHROPLASTY;  Surgeon: Nestor Lewandowsky, MD;  Location: [facility] OR;  Service: Orthopedics;  Laterality: Right;  TUBAL LIGATION      Allergies  Allergen Reactions   Sulfa Antibiotics Rash   Biaxin [Clarithromycin] Other (See Comments)    Pt does not remember reaction    Ciprofloxacin Nausea And Vomiting   Hctz [Hydrochlorothiazide] Other (See Comments)    weakness and hyponatremia   Macrobid [Nitrofurantoin] Nausea Only   Other Nausea Only   Prednisone Other (See Comments)    Swelling and facial redness    Zofran [Ondansetron]    Sulfamethoxazole-Trimethoprim Other (See Comments) and Rash    Sore throat    Outpatient Encounter Medications as of 06/09/2022  Medication Sig   acetaminophen (TYLENOL) 500 MG tablet Take 1,000 mg by mouth every 8 (eight) hours as  needed.   amLODipine (NORVASC) 5 MG tablet Take 5 mg by mouth daily.   Ascorbic Acid (VITAMIN C PO) Take 1,000 mg by mouth daily. (Patient not taking: Reported on 05/05/2022)   carbidopa-levodopa (SINEMET IR) 25-100 MG tablet Take 1 tablet by mouth 4 (four) times daily. 0800, 1100, 1500, 2000   Carbidopa-Levodopa ER (SINEMET CR) 25-100 MG tablet controlled release Take 1 tablet by mouth at bedtime.   clonazePAM (KLONOPIN) 0.5 MG tablet Take 0.5 tablets (0.25 mg total) by mouth at bedtime.   CRANBERRY-VITAMIN C-D MANNOSE PO Take 1 tablet by mouth 3 (three) times daily.   hydrALAZINE (APRESOLINE) 10 MG tablet Take 10 mg by mouth daily as needed. For SBP more then 180   irbesartan (AVAPRO) 150 MG tablet Take 150 mg by mouth daily.   levothyroxine (SYNTHROID) 150 MCG tablet Take 1 tablet (150 mcg total) by mouth daily before breakfast.   LORazepam (ATIVAN) 0.5 MG tablet Take 0.25 mg by mouth daily as needed for anxiety.   Multiple Vitamin (MULTIVITAMIN) tablet Take 1 tablet by mouth daily.   polyethylene glycol (MIRALAX / GLYCOLAX) 17 g packet Take 8.5 g by mouth daily.   QUEtiapine (SEROQUEL) 25 MG tablet Take 0.5 tablets (12.5 mg total) by mouth at bedtime.   senna-docusate (SENOKOT-S) 8.6-50 MG tablet Take 1 tablet by mouth at bedtime.   traMADol (ULTRAM) 50 MG tablet Take 50 mg by mouth every 6 (six) hours as needed.   No facility-administered encounter medications on file as of 06/09/2022.    Review of Systems  Constitutional:  Negative for activity change, appetite change, chills, diaphoresis, fatigue, fever and unexpected weight change.  HENT:  Negative for congestion.   Respiratory:  Negative for cough, shortness of breath and wheezing.   Cardiovascular:  Negative for chest pain, palpitations and leg swelling.  Gastrointestinal:  Negative for abdominal distention, abdominal pain, constipation and diarrhea.  Genitourinary:  Negative for difficulty urinating and dysuria.  Musculoskeletal:   Positive for back pain (intermittent chronic) and gait problem. Negative for arthralgias, joint swelling and myalgias.  Neurological:  Negative for dizziness, tremors, seizures, syncope, facial asymmetry, speech difficulty, weakness, light-headedness, numbness and headaches.       Slow movement dysphonia, rigidity   Psychiatric/Behavioral:  Positive for confusion and dysphoric mood. Negative for agitation and behavioral problems. The patient is nervous/anxious.     Immunization History  Administered Date(s) Administered   Covid-19, Mrna,Vaccine(Spikevax)41yrs and older 11/29/2021   DTP 01/24/1994   Influenza Split 10/09/2007, 10/17/2008, 10/23/2009, 11/18/2011, 11/21/2012, 12/10/2019   Influenza Whole 10/09/2007   Influenza,inj,Quad PF,6+ Mos 10/26/2010   Influenza-Unspecified 11/05/2013, 10/31/2014, 10/12/2015, 10/20/2016, 11/15/2017, 11/13/2020, 10/29/2021   Moderna SARS-COV2 Booster Vaccination 11/28/2019, 05/28/2020, 11/06/2020   Moderna Sars-Covid-2 Vaccination 01/29/2019, 02/28/2019, 06/28/2021   Pneumococcal Conjugate-13 10/23/2014  Pneumococcal Polysaccharide-23 10/25/2002   Rsv, Bivalent, Protein Subunit Rsvpref,pf Verdis Frederickson) 02/08/2022   Tdap 05/23/2021   Zoster, Live 10/13/2005   Zoster, Unspecified 10/13/2005   Pertinent  Health Maintenance Due  Topic Date Due   INFLUENZA VACCINE  08/25/2022   DEXA SCAN  Completed   PAP SMEAR-Modifier  Discontinued      04/19/2021   10:11 AM 04/19/2021   11:00 PM 04/20/2021   12:00 PM 04/29/2021   12:25 PM 05/23/2021   12:01 PM  Fall Risk  (RETIRED) Patient Fall Risk Level High fall risk High fall risk High fall risk Low fall risk Moderate fall risk   Functional Status Survey:    Vitals:   06/09/22 1643  Weight: 129 lb 12.8 oz (58.9 kg)   Body mass index is 22.99 kg/m. Wt Readings from Last 3 Encounters:  06/09/22 129 lb 12.8 oz (58.9 kg)  05/05/22 123 lb 12.8 oz (56.2 kg)  04/25/22 123 lb 12.8 oz (56.2 kg)    Physical  Exam Vitals and nursing note reviewed.  Constitutional:      General: She is not in acute distress.    Appearance: She is not diaphoretic.  HENT:     Head: Normocephalic and atraumatic.     Right Ear: Tympanic membrane and ear canal normal.     Left Ear: Tympanic membrane and ear canal normal.     Mouth/Throat:     Mouth: Mucous membranes are moist.     Pharynx: Oropharynx is clear.  Neck:     Vascular: No JVD.  Cardiovascular:     Rate and Rhythm: Normal rate and regular rhythm.     Heart sounds: No murmur heard. Pulmonary:     Effort: Pulmonary effort is normal. No respiratory distress.     Breath sounds: Normal breath sounds. No wheezing.  Abdominal:     General: Bowel sounds are normal. There is no distension.     Palpations: Abdomen is soft.     Tenderness: There is no abdominal tenderness.  Musculoskeletal:     Right lower leg: No edema.     Left lower leg: No edema.  Skin:    General: Skin is warm and dry.  Neurological:     General: No focal deficit present.     Mental Status: She is alert. Mental status is at baseline.     Comments: Slow movement, rigidity, decreased ROM to BUE and BLE  Psychiatric:        Mood and Affect: Mood normal.     Labs reviewed: Recent Labs    09/09/21 0000 09/13/21 0000 10/18/21 0000  NA 138 138 138  K 3.9 3.7 4.1  CL 102 101 103  CO2 30* 30* 28*  BUN 22* 25* 25*  CREATININE 0.9 0.7 0.9  CALCIUM 8.5* 9.0 9.0    Recent Labs    09/09/21 0000 09/13/21 0000  AST 16 17  ALT 8 8  ALKPHOS 78 77  ALBUMIN 3.7 3.9    Recent Labs    07/26/21 0000 09/09/21 0000 09/13/21 0000  WBC 6.5 5.8 6.5  NEUTROABS  --  4.10  --   HGB 12.5 12.2 12.5  HCT 37 36 37  PLT 190 186 212    Lab Results  Component Value Date   TSH 4.80 05/26/2022   Lab Results  Component Value Date   HGBA1C 5.6 10/15/2013   No results found for: "CHOL", "HDL", "LDLCALC", "LDLDIRECT", "TRIG", "CHOLHDL"  Significant Diagnostic Results in last 30  days:  No results found.  Assessment/Plan  1. Hypothyroidism, postsurgical history of thyroid cancer Improved Continue Synthroid 150 mcg   2. Essential hypertension Improving Continue norvasc and Avapro   3. Parkinson's disease with dyskinesia and fluctuating manifestations Severe followed by hospice Continues on sinemet  4. 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 Constipation Continue miralax and senokot.   6. Low back pain Continue prn tylenol and tramadol  Family/ staff Communication: nurse  Labs/tests ordered:  NA

## 2022-06-14 ENCOUNTER — Other Ambulatory Visit: Payer: Self-pay | Admitting: Orthopedic Surgery

## 2022-06-14 DIAGNOSIS — F418 Other specified anxiety disorders: Secondary | ICD-10-CM

## 2022-06-14 MED ORDER — CLONAZEPAM 0.5 MG PO TABS
0.2500 mg | ORAL_TABLET | Freq: Every day | ORAL | 0 refills | Status: DC
Start: 2022-06-14 — End: 2022-10-17

## 2022-06-14 MED ORDER — CLONAZEPAM 0.5 MG PO TABS: 0.2500 mg | ORAL_TABLET | Freq: Every day | ORAL | 0 refills | Status: DC

## 2022-07-07 LAB — TSH: TSH: 1.95 (ref 0.41–5.90)

## 2022-07-11 ENCOUNTER — Non-Acute Institutional Stay (SKILLED_NURSING_FACILITY): Payer: Medicare Other | Admitting: Internal Medicine

## 2022-07-11 ENCOUNTER — Encounter: Payer: Self-pay | Admitting: Internal Medicine

## 2022-07-11 DIAGNOSIS — F418 Other specified anxiety disorders: Secondary | ICD-10-CM

## 2022-07-11 DIAGNOSIS — G20A1 Parkinson's disease without dyskinesia, without mention of fluctuations: Secondary | ICD-10-CM

## 2022-07-11 DIAGNOSIS — G20B2 Parkinson's disease with dyskinesia, with fluctuations: Secondary | ICD-10-CM | POA: Diagnosis not present

## 2022-07-11 DIAGNOSIS — E890 Postprocedural hypothyroidism: Secondary | ICD-10-CM

## 2022-07-11 DIAGNOSIS — F02C2 Dementia in other diseases classified elsewhere, severe, with psychotic disturbance: Secondary | ICD-10-CM

## 2022-07-11 DIAGNOSIS — I10 Essential (primary) hypertension: Secondary | ICD-10-CM

## 2022-07-11 NOTE — Progress Notes (Unsigned)
[facility] Room Number: No/130/A Place of Service:  SNF [phone]) Provider:  Mahlon Gammon, MD  Patient Care Team: Mahlon Gammon, MD as PCP - General (Internal Medicine) Lyn Records, MD (Inactive) as Consulting Physician (Cardiology)  Extended Emergency Contact Information Primary Emergency Contact: Jacquelynn Cree Address: [address]          [place], [place] [address] [place] of [place] Home Phone: [phone] Work Phone: [phone] Mobile Phone: [phone] Relation: Spouse Secondary Emergency Contact: Darcella Cheshire , [place] Home Phone: [phone] Relation: Granddaughter  Code Status:  DNR/Hospice Goals of care: Advanced Directive information    07/11/2022    4:41 PM  Advanced Directives  Does Patient Have a Medical Advance Directive? Yes  Type of Advance Directive Out of facility DNR (pink MOST or yellow form)  Does patient want to make changes to medical advance directive? No - Patient declined     Chief Complaint  Patient presents with   Medical Management of Chronic Issues    Patient is here for a follow up for chronic conditions     HPI:  Pt is a 89 y.o. female seen today for medical management of chronic diseases.   Lives in SNF  Enrolled in Hospice She has h/o  Parkinson.   h/o cognitive impairment  Hypertension Hypothyroidism Depression Urinary In continence Osteoporosis Patient has had h/o Dyskinetic movement and slurred speech     She is stable. No new Nursing issues. No Behavior issues Her weight is stable She now also has Sitters to help her She is Product/process development scientist dependent No Falls Wt Readings from Last 3 Encounters:  07/11/22 126 lb 6.4 oz (57.3 kg)  06/09/22 129 lb 12.8 oz (58.9 kg)  05/05/22 123 lb 12.8 oz (56.2 kg)      Past Medical History:  Diagnosis Date   Anxiety    Arthritis    knee & back    Blood type, Rh negative    Breast  cancer (HCC)    Chronic fatigue    Depression    Diverticulosis    Endocervical polyp    GERD (gastroesophageal reflux disease)    Hypertension    Hypothyroidism    IBS (irritable bowel syndrome)    Insomnia    Lymphedema    Neuromuscular disorder (HCC)    Parkinson's disease    Osteopenia 06/2015   T score -2.4 distal third of the radius   Parkinson disease    RBBB 10/07/2013   Thyroid cancer (HCC)    Past Surgical History:  Procedure Laterality Date   APPENDECTOMY  1979   BREAST SURGERY  1979   MASTECTOMY AND RECONSTRUCTION   DILATION AND CURETTAGE OF UTERUS     MISCARRIAGE   EYE SURGERY Bilateral    w/ IOL   HYSTEROSCOPY  2005,2009   AND D&C   MASTECTOMY Bilateral    THYROIDECTOMY     TONSILLECTOMY     TOTAL KNEE ARTHROPLASTY Right 10/07/2013   Procedure: RIGHT TOTAL KNEE ARTHROPLASTY;  Surgeon: Nestor Lewandowsky, MD;  Location: [facility] OR;  Service: Orthopedics;  Laterality: Right;   TUBAL LIGATION      Allergies  Allergen Reactions   Sulfa Antibiotics Rash   Biaxin [Clarithromycin] Other (See Comments)    Pt does not remember reaction    Ciprofloxacin Nausea And Vomiting   Hctz [Hydrochlorothiazide] Other (See Comments)    weakness and hyponatremia  Macrobid [Nitrofurantoin] Nausea Only   Other Nausea Only   Prednisone Other (See Comments)    Swelling and facial redness    Zofran [Ondansetron]    Sulfamethoxazole-Trimethoprim Other (See Comments) and Rash    Sore throat    Outpatient Encounter Medications as of 07/11/2022  Medication Sig   acetaminophen (TYLENOL) 500 MG tablet Take 1,000 mg by mouth every 8 (eight) hours as needed.   amLODipine (NORVASC) 5 MG tablet Take 5 mg by mouth daily.   carbidopa-levodopa (SINEMET IR) 25-100 MG tablet Take 1 tablet by mouth 4 (four) times daily. 0800, 1100, 1500, 2000   Carbidopa-Levodopa ER (SINEMET CR) 25-100 MG tablet controlled release Take 1 tablet by mouth at bedtime.   clonazePAM (KLONOPIN) 0.5 MG tablet Take 0.5  tablets (0.25 mg total) by mouth at bedtime.   CRANBERRY-VITAMIN C-D MANNOSE PO Take 1 tablet by mouth 3 (three) times daily.   hydrALAZINE (APRESOLINE) 10 MG tablet Take 10 mg by mouth daily as needed. For SBP more then 180   irbesartan (AVAPRO) 150 MG tablet Take 150 mg by mouth daily.   levothyroxine (SYNTHROID) 150 MCG tablet Take 1 tablet (150 mcg total) by mouth daily before breakfast.   LORazepam (ATIVAN) 0.5 MG tablet Take 0.25 mg by mouth daily as needed for anxiety.   Multiple Vitamin (MULTIVITAMIN) tablet Take 1 tablet by mouth daily. Cranberry manose vit c   polyethylene glycol (MIRALAX / GLYCOLAX) 17 g packet Take 8.5 g by mouth daily.   QUEtiapine (SEROQUEL) 25 MG tablet Take 0.5 tablets (12.5 mg total) by mouth at bedtime.   senna-docusate (SENOKOT-S) 8.6-50 MG tablet Take 1 tablet by mouth at bedtime.   traMADol (ULTRAM) 50 MG tablet Take 50 mg by mouth every 6 (six) hours as needed.   No facility-administered encounter medications on file as of 07/11/2022.    Review of Systems  Unable to perform ROS: Dementia    Immunization History  Administered Date(s) Administered   Covid-19, Mrna,Vaccine(Spikevax)61yrs and older 11/29/2021   DTP 01/24/1994   Influenza Split 10/09/2007, 10/17/2008, 10/23/2009, 11/18/2011, 11/21/2012, 12/10/2019   Influenza Whole 10/09/2007   Influenza,inj,Quad PF,6+ Mos 10/26/2010   Influenza-Unspecified 11/05/2013, 10/31/2014, 10/12/2015, 10/20/2016, 11/15/2017, 11/13/2020, 10/29/2021   Moderna Covid-19 Vaccine Bivalent Booster 33yrs & up 05/19/2022   Moderna SARS-COV2 Booster Vaccination 11/28/2019, 05/28/2020, 11/06/2020   Moderna Sars-Covid-2 Vaccination 01/29/2019, 02/28/2019, 06/28/2021   PPD Test 06/17/2021   Pneumococcal Conjugate-13 10/23/2014   Pneumococcal Polysaccharide-23 10/25/2002   Rsv, Bivalent, Protein Subunit Rsvpref,pf Verdis Frederickson) 02/08/2022   Tdap 05/23/2021   Zoster, Live 10/13/2005   Zoster, Unspecified 10/13/2005    Pertinent  Health Maintenance Due  Topic Date Due   INFLUENZA VACCINE  08/25/2022   DEXA SCAN  Completed   PAP SMEAR-Modifier  Discontinued      04/19/2021   11:00 PM 04/20/2021   12:00 PM 04/29/2021   12:25 PM 05/23/2021   12:01 PM 07/11/2022    4:41 PM  Fall Risk  Falls in the past year?     1  Was there an injury with Fall?     0  Fall Risk Category Calculator     2  (RETIRED) Patient Fall Risk Level High fall risk High fall risk Low fall risk Moderate fall risk   Patient at Risk for Falls Due to     History of fall(s);Impaired balance/gait;Impaired mobility  Fall risk Follow up     Falls evaluation completed   Functional Status Survey:    Vitals:  07/11/22 1644  BP: (!) 142/82  Pulse: 75  Resp: 18  Temp: (!) 97.5 F (36.4 C)  SpO2: 96%  Weight: 126 lb 6.4 oz (57.3 kg)  Height: 5\' 3"  (1.6 m)   Body mass index is 22.39 kg/m. Physical Exam Vitals reviewed.  Constitutional:      Appearance: Normal appearance.  HENT:     Head: Normocephalic.     Nose: Nose normal.     Mouth/Throat:     Mouth: Mucous membranes are moist.     Pharynx: Oropharynx is clear.  Eyes:     Pupils: Pupils are equal, round, and reactive to light.  Cardiovascular:     Rate and Rhythm: Normal rate and regular rhythm.     Pulses: Normal pulses.     Heart sounds: Normal heart sounds. No murmur heard. Pulmonary:     Effort: Pulmonary effort is normal.     Breath sounds: Normal breath sounds.  Abdominal:     General: Abdomen is flat. Bowel sounds are normal.     Palpations: Abdomen is soft.  Musculoskeletal:        General: No swelling.     Cervical back: Neck supple.  Skin:    General: Skin is warm.  Neurological:     General: No focal deficit present.     Mental Status: She is alert.     Comments: Has aphasia Gets confused   Psychiatric:        Mood and Affect: Mood normal.        Thought Content: Thought content normal.     Labs reviewed: Recent Labs    09/09/21 0000  09/13/21 0000 10/18/21 0000  NA 138 138 138  K 3.9 3.7 4.1  CL 102 101 103  CO2 30* 30* 28*  BUN 22* 25* 25*  CREATININE 0.9 0.7 0.9  CALCIUM 8.5* 9.0 9.0   Recent Labs    09/09/21 0000 09/13/21 0000  AST 16 17  ALT 8 8  ALKPHOS 78 77  ALBUMIN 3.7 3.9   Recent Labs    07/26/21 0000 09/09/21 0000 09/13/21 0000  WBC 6.5 5.8 6.5  NEUTROABS  --  4.10  --   HGB 12.5 12.2 12.5  HCT 37 36 37  PLT 190 186 212   Lab Results  Component Value Date   TSH 4.80 05/26/2022   Lab Results  Component Value Date   HGBA1C 5.6 10/15/2013   No results found for: "CHOL", "HDL", "LDLCALC", "LDLDIRECT", "TRIG", "CHOLHDL"  Significant Diagnostic Results in last 30 days:  No results found.  Assessment/Plan 1. Essential hypertension BP has been stable On Norvasc and Avapro  2. Hypothyroidism, postsurgical history of thyroid cancer TSH normal now  3. Severe dementia due to Parkinson's disease, with psychotic disturbance (HCC) Enrolled in [facility] dependent  4. Parkinson's disease with dyskinesia and fluctuating manifestations On Sinemet  5. Depression with anxiety On Low dose of Klonopi and Seroquel    Family/ staff Communication:   Labs/tests ordered:

## 2022-08-15 ENCOUNTER — Encounter: Payer: Self-pay | Admitting: Adult Health

## 2022-08-15 ENCOUNTER — Non-Acute Institutional Stay (SKILLED_NURSING_FACILITY): Payer: Medicare Other | Admitting: Adult Health

## 2022-08-15 DIAGNOSIS — M810 Age-related osteoporosis without current pathological fracture: Secondary | ICD-10-CM

## 2022-08-15 DIAGNOSIS — I10 Essential (primary) hypertension: Secondary | ICD-10-CM

## 2022-08-15 DIAGNOSIS — G20B2 Parkinson's disease with dyskinesia, with fluctuations: Secondary | ICD-10-CM

## 2022-08-15 DIAGNOSIS — E890 Postprocedural hypothyroidism: Secondary | ICD-10-CM

## 2022-08-15 DIAGNOSIS — G20A1 Parkinson's disease without dyskinesia, without mention of fluctuations: Secondary | ICD-10-CM | POA: Diagnosis not present

## 2022-08-15 DIAGNOSIS — F02C2 Dementia in other diseases classified elsewhere, severe, with psychotic disturbance: Secondary | ICD-10-CM

## 2022-08-15 DIAGNOSIS — K5901 Slow transit constipation: Secondary | ICD-10-CM

## 2022-08-15 NOTE — Progress Notes (Unsigned)
Location:  [facility] Room Number: 130A Place of Service:  SNF [phone]) Provider:  Tamsen Roers, MD  Patient Care Team: Mahlon Gammon, MD as PCP - General (Internal Medicine) Lyn Records, MD (Inactive) as Consulting Physician (Cardiology)  Extended Emergency Contact Information Primary Emergency Contact: Jacquelynn Cree Address: [address]          [place], [place] [address] [place] of [place] Home Phone: [phone] Work Phone: [phone] Mobile Phone: [phone] Relation: Spouse Secondary Emergency Contact: Darcella Cheshire , [place] Home Phone: [phone] Relation: Granddaughter  Code Status:  DNR hospice Goals of care: Advanced Directive information    08/15/2022    9:40 AM  Advanced Directives  Does Patient Have a Medical Advance Directive? Yes  Type of Advance Directive Out of facility DNR (pink MOST or yellow form)  Does patient want to make changes to medical advance directive? No - Patient declined     Chief Complaint  Patient presents with   Medical Management of Chronic Issues    Patient is being seen for routine visit     HPI:  Pt is a 89 y.o. female seen today for medical management of chronic diseases.    Enrolled in hospice due to PD, dementia, and functional decline.   Continues with PD symptoms of tremor at baseline. Also has slow movement, rigidity, and dysphonia.   BP controlled now on norvasc and avapro  Currently on clonazepam and seroquel for paranoia and anxiety which seem to be helping. Also has prn ativan for periods of anxiety and crying which helps per nurse. She uses ativan prn for crying and anxiety which helps. For my visit she is with her caregiver and sitting in a hoyer chair. No acute complaints.   MMSE 22/30 06/13/2021. CT head 05/23/21 noted atrophy with chronic small vessel ischemic changes. Off exelon due to cost and  lack of benefit She is able to communicate. Needs help with feeding and other ADls. Using a lift for transfer and wears briefs due to incontinence.   S/p thyroidectomy due to thyroid ca, on synthroid. Change to generic.  Lab Results  Component Value Date   TSH 1.95 07/07/2022   OP:T score -2.8 07/23/20, previous on reclast per Dr Sharl Ma in 2022.  Not currently on meds.   On miralax and senokot, BMs recorded regularly  Past Medical History:  Diagnosis Date   Anxiety    Arthritis    knee & back    Blood type, Rh negative    Breast cancer (HCC)    Chronic fatigue    Depression    Diverticulosis    Endocervical polyp    GERD (gastroesophageal reflux disease)    Hypertension    Hypothyroidism    IBS (irritable bowel syndrome)    Insomnia    Lymphedema    Neuromuscular disorder (HCC)    Parkinson's disease    Osteopenia 06/2015   T score -2.4 distal third of the radius   Parkinson disease    RBBB 10/07/2013   Thyroid cancer [facility])    Past Surgical History:  Procedure Laterality Date   APPENDECTOMY  1979   BREAST SURGERY  1979   MASTECTOMY AND RECONSTRUCTION   DILATION AND CURETTAGE OF UTERUS     MISCARRIAGE   EYE SURGERY Bilateral    w/ IOL   HYSTEROSCOPY  2005,2009   AND D&C   MASTECTOMY  Bilateral    THYROIDECTOMY     TONSILLECTOMY     TOTAL KNEE ARTHROPLASTY Right 10/07/2013   Procedure: RIGHT TOTAL KNEE ARTHROPLASTY;  Surgeon: Nestor Lewandowsky, MD;  Location: [facility] OR;  Service: Orthopedics;  Laterality: Right;   TUBAL LIGATION      Allergies  Allergen Reactions   Sulfa Antibiotics Rash   Biaxin [Clarithromycin] Other (See Comments)    Pt does not remember reaction    Ciprofloxacin Nausea And Vomiting   Hctz [Hydrochlorothiazide] Other (See Comments)    weakness and hyponatremia   Macrobid [Nitrofurantoin] Nausea Only   Other Nausea Only   Prednisone Other (See Comments)    Swelling and facial redness    Zofran [Ondansetron]    Sulfamethoxazole-Trimethoprim  Other (See Comments) and Rash    Sore throat    Outpatient Encounter Medications as of 08/15/2022  Medication Sig   acetaminophen (TYLENOL) 500 MG tablet Take 1,000 mg by mouth every 8 (eight) hours as needed.   amLODipine (NORVASC) 5 MG tablet Take 5 mg by mouth daily.   carbidopa-levodopa (SINEMET IR) 25-100 MG tablet Take 1 tablet by mouth 4 (four) times daily. 0800, 1100, 1500, 2000   Carbidopa-Levodopa ER (SINEMET CR) 25-100 MG tablet controlled release Take 1 tablet by mouth at bedtime.   clonazePAM (KLONOPIN) 0.5 MG tablet Take 0.5 tablets (0.25 mg total) by mouth at bedtime.   CRANBERRY-VITAMIN C-D MANNOSE PO Take 1 tablet by mouth 3 (three) times daily.   irbesartan (AVAPRO) 150 MG tablet Take 150 mg by mouth daily.   levothyroxine (SYNTHROID) 150 MCG tablet Take 1 tablet (150 mcg total) by mouth daily before breakfast.   LORazepam (ATIVAN) 0.5 MG tablet Take 0.25 mg by mouth daily as needed for anxiety.   Multiple Vitamin (MULTIVITAMIN) tablet Take 1 tablet by mouth daily. Cranberry manose vit c   polyethylene glycol (MIRALAX / GLYCOLAX) 17 g packet Take 8.5 g by mouth daily.   QUEtiapine (SEROQUEL) 25 MG tablet Take 0.5 tablets (12.5 mg total) by mouth at bedtime.   senna-docusate (SENOKOT-S) 8.6-50 MG tablet Take 1 tablet by mouth at bedtime.   traMADol (ULTRAM) 50 MG tablet Take 50 mg by mouth every 6 (six) hours as needed.   [DISCONTINUED] hydrALAZINE (APRESOLINE) 10 MG tablet Take 10 mg by mouth daily as needed. For SBP more then 180 (Patient not taking: Reported on 08/15/2022)   No facility-administered encounter medications on file as of 08/15/2022.    Review of Systems  Constitutional:  Negative for activity change, appetite change, chills, diaphoresis, fatigue, fever and unexpected weight change.  HENT:  Negative for congestion.   Respiratory:  Negative for cough, shortness of breath and wheezing.   Cardiovascular:  Negative for chest pain, palpitations and leg swelling.   Gastrointestinal:  Negative for abdominal distention, abdominal pain, constipation and diarrhea.  Genitourinary:  Negative for difficulty urinating and dysuria.  Musculoskeletal:  Positive for gait problem. Negative for arthralgias, back pain, joint swelling and myalgias.  Neurological:  Positive for tremors and weakness. Negative for dizziness, seizures, syncope, facial asymmetry, speech difficulty, light-headedness, numbness and headaches.       Rigid, slow movements  Psychiatric/Behavioral:  Positive for confusion. Negative for agitation and behavioral problems. The patient is nervous/anxious.     Immunization History  Administered Date(s) Administered   Covid-19, Mrna,Vaccine(Spikevax)85yrs and older 11/29/2021   DTP 01/24/1994   Influenza Split 10/09/2007, 10/17/2008, 10/23/2009, 11/18/2011, 11/21/2012, 12/10/2019   Influenza Whole 10/09/2007   Influenza,inj,Quad PF,6+ Mos 10/26/2010  Influenza-Unspecified 11/05/2013, 10/31/2014, 10/12/2015, 10/20/2016, 11/15/2017, 11/13/2020, 10/29/2021   Moderna Covid-19 Vaccine Bivalent Booster 64yrs & up 05/19/2022   Moderna SARS-COV2 Booster Vaccination 11/28/2019, 05/28/2020, 11/06/2020   Moderna Sars-Covid-2 Vaccination 01/29/2019, 02/28/2019, 06/28/2021   PPD Test 06/17/2021   Pneumococcal Conjugate-13 10/23/2014   Pneumococcal Polysaccharide-23 10/25/2002   Rsv, Bivalent, Protein Subunit Rsvpref,pf Verdis Frederickson) 02/08/2022   Tdap 05/23/2021   Zoster, Live 10/13/2005   Zoster, Unspecified 10/13/2005   Pertinent  Health Maintenance Due  Topic Date Due   INFLUENZA VACCINE  08/25/2022   DEXA SCAN  Completed   PAP SMEAR-Modifier  Discontinued      04/19/2021   11:00 PM 04/20/2021   12:00 PM 04/29/2021   12:25 PM 05/23/2021   12:01 PM 07/11/2022    4:41 PM  Fall Risk  Falls in the past year?     1  Was there an injury with Fall?     0  Fall Risk Category Calculator     2  (RETIRED) Patient Fall Risk Level High fall risk High fall risk Low  fall risk Moderate fall risk   Patient at Risk for Falls Due to     History of fall(s);Impaired balance/gait;Impaired mobility  Fall risk Follow up     Falls evaluation completed   Functional Status Survey:    Vitals:   08/15/22 0934  BP: 109/73  Pulse: 73  Resp: 17  Temp: (!) 97 F (36.1 C)  TempSrc: Temporal  SpO2: 94%  Weight: 130 lb 6.4 oz (59.1 kg)  Height: 5\' 3"  (1.6 m)   Body mass index is 23.1 kg/m. Physical Exam Vitals and nursing note reviewed.  Constitutional:      General: She is not in acute distress.    Appearance: She is not diaphoretic.  HENT:     Head: Normocephalic and atraumatic.  Neck:     Vascular: No JVD.  Cardiovascular:     Rate and Rhythm: Normal rate and regular rhythm.     Heart sounds: No murmur heard. Pulmonary:     Effort: Pulmonary effort is normal. No respiratory distress.     Breath sounds: Normal breath sounds. No wheezing.  Abdominal:     General: Abdomen is flat. Bowel sounds are normal. There is no distension.     Palpations: Abdomen is soft.  Musculoskeletal:     Right lower leg: No edema.     Left lower leg: No edema.     Comments: Rigidity to BUE and BLE extremity  Skin:    General: Skin is warm and dry.  Neurological:     General: No focal deficit present.     Mental Status: She is alert. Mental status is at baseline.  Psychiatric:        Mood and Affect: Mood normal.     Labs reviewed: Recent Labs    09/09/21 0000 09/13/21 0000 10/18/21 0000  NA 138 138 138  K 3.9 3.7 4.1  CL 102 101 103  CO2 30* 30* 28*  BUN 22* 25* 25*  CREATININE 0.9 0.7 0.9  CALCIUM 8.5* 9.0 9.0   Recent Labs    09/09/21 0000 09/13/21 0000  AST 16 17  ALT 8 8  ALKPHOS 78 77  ALBUMIN 3.7 3.9   Recent Labs    09/09/21 0000 09/13/21 0000  WBC 5.8 6.5  NEUTROABS 4.10  --   HGB 12.2 12.5  HCT 36 37  PLT 186 212   Lab Results  Component Value Date  TSH 1.95 07/07/2022   Lab Results  Component Value Date   HGBA1C 5.6  10/15/2013   No results found for: "CHOL", "HDL", "LDLCALC", "LDLDIRECT", "TRIG", "CHOLHDL"  Significant Diagnostic Results in last 30 days:  No results found.  Assessment/Plan  1. Parkinson's disease with dyskinesia and fluctuating manifestations Under hospice care Continues in Sinemet Needs assistance with all ADLs Continues to benefit from skilled care  2. Severe dementia due to Parkinson's disease, with psychotic disturbance (HCC) Has some hx of paranoia On seroquel at night Doing well   3. Essential hypertension Controlled Continue avapro  4. Age-related osteoporosis without current pathological fracture Previously on meds Not weight bearing Under hospice care would not pursue further   5. Slow transit constipation Continue miralax and senokot  6. Postoperative hypothyroidism Continue synthroid    Family/ staff Communication: nurse  Labs/tests ordered:  NA

## 2022-08-16 ENCOUNTER — Encounter: Payer: Self-pay | Admitting: Adult Health

## 2022-09-12 ENCOUNTER — Encounter: Payer: Self-pay | Admitting: Adult Health

## 2022-09-12 ENCOUNTER — Non-Acute Institutional Stay (SKILLED_NURSING_FACILITY): Payer: Medicare Other | Admitting: Adult Health

## 2022-09-12 DIAGNOSIS — F02C2 Dementia in other diseases classified elsewhere, severe, with psychotic disturbance: Secondary | ICD-10-CM

## 2022-09-12 DIAGNOSIS — K5901 Slow transit constipation: Secondary | ICD-10-CM

## 2022-09-12 DIAGNOSIS — N390 Urinary tract infection, site not specified: Secondary | ICD-10-CM

## 2022-09-12 DIAGNOSIS — E890 Postprocedural hypothyroidism: Secondary | ICD-10-CM | POA: Diagnosis not present

## 2022-09-12 DIAGNOSIS — I10 Essential (primary) hypertension: Secondary | ICD-10-CM

## 2022-09-12 DIAGNOSIS — M810 Age-related osteoporosis without current pathological fracture: Secondary | ICD-10-CM

## 2022-09-12 DIAGNOSIS — F32A Depression, unspecified: Secondary | ICD-10-CM

## 2022-09-12 DIAGNOSIS — G20A1 Parkinson's disease without dyskinesia, without mention of fluctuations: Secondary | ICD-10-CM

## 2022-09-12 DIAGNOSIS — F419 Anxiety disorder, unspecified: Secondary | ICD-10-CM | POA: Diagnosis not present

## 2022-09-12 DIAGNOSIS — G20B2 Parkinson's disease with dyskinesia, with fluctuations: Secondary | ICD-10-CM

## 2022-09-12 NOTE — Progress Notes (Signed)
Location:  [facility] Room Number: 130A Place of Service:  SNF [phone]) Provider:  Tamsen Roers, MD  Patient Care Team: Mahlon Gammon, MD as PCP - General (Internal Medicine) Lyn Records, MD (Inactive) as Consulting Physician (Cardiology)  Extended Emergency Contact Information Primary Emergency Contact: Jacquelynn Cree Address: [address]          [place], [place] [address] [place] of [place] Home Phone: [phone] Work Phone: [phone] Mobile Phone: [phone] Relation: Spouse Secondary Emergency Contact: Darcella Cheshire , [place] Home Phone: [phone] Relation: Granddaughter  Code Status:  DNR Hospice  Goals of care: Advanced Directive information    08/15/2022    9:40 AM  Advanced Directives  Does Patient Have a Medical Advance Directive? Yes  Type of Advance Directive Out of facility DNR (pink MOST or yellow form)  Does patient want to make changes to medical advance directive? No - Patient declined     Chief Complaint  Patient presents with   Medical Management of Chronic Issues    Patient is being seen for routine visit    Immunizations    Patient is due for a flu vaccine     HPI:  Pt is a 89 y.o. female seen today for medical management of chronic diseases.    Enrolled in hospice due to PD, dementia, and functional decline.   Continues with PD symptoms of tremor at baseline. Also has slow movement, rigidity, and dysphonia.   BP controlled now on norvasc and avapro  Bps reviewed in matrix.   Blood pressure: 167 / 84 mmHg   Blood Pressure: 142 / 86 mmHg   Blood Pressure: 148 / 77 mmHg  Blood Pressure: 109 / 73 mmHg  She was having issues with anxiety and crying. Lexapro as started but her family discussed with hospice that they would prefer seroquel so this was increased at 12.5 mg bid. This is helping with periods of anxiety. She is  sleeping a little bit more as well. Denies any feelings of anxiety. Husband at bedside. He expressed some concerns about her sleeping but is overall pleased with her mood. Weight is stable at 130 lbs.   MMSE 22/30 06/13/2021. CT head 05/23/21 noted atrophy with chronic small vessel ischemic changes. Off exelon due to cost and lack of benefit She is able to communicate. Needs help with feeding and other ADls. Using a lift for transfer and wears briefs due to incontinence. Can eat finger foods.   S/p thyroidectomy due to thyroid ca, on synthroid. Change to generic.  Lab Results  Component Value Date   TSH 1.95 07/07/2022    OP:T score -2.8 07/23/20, previous on reclast per Dr Sharl Ma in 2022.  Not currently on meds.   On miralax and senokot, BMs recorded regularly    Past Medical History:  Diagnosis Date   Anxiety    Arthritis    knee & back    Blood type, Rh negative    Breast cancer (HCC)    Chronic fatigue    Depression    Diverticulosis    Endocervical polyp    GERD (gastroesophageal reflux disease)    Hypertension    Hypothyroidism    IBS (irritable bowel syndrome)    Insomnia    Lymphedema    Neuromuscular disorder (HCC)    Parkinson's disease    Osteopenia 06/2015   T score -2.4 distal third  of the radius   Parkinson disease    RBBB 10/07/2013   Thyroid cancer [facility])    Past Surgical History:  Procedure Laterality Date   APPENDECTOMY  1979   BREAST SURGERY  1979   MASTECTOMY AND RECONSTRUCTION   DILATION AND CURETTAGE OF UTERUS     MISCARRIAGE   EYE SURGERY Bilateral    w/ IOL   HYSTEROSCOPY  2005,2009   AND D&C   MASTECTOMY Bilateral    THYROIDECTOMY     TONSILLECTOMY     TOTAL KNEE ARTHROPLASTY Right 10/07/2013   Procedure: RIGHT TOTAL KNEE ARTHROPLASTY;  Surgeon: Nestor Lewandowsky, MD;  Location: [facility] OR;  Service: Orthopedics;  Laterality: Right;   TUBAL LIGATION      Allergies  Allergen Reactions   Sulfa Antibiotics Rash   Biaxin [Clarithromycin] Other  (See Comments)    Pt does not remember reaction    Ciprofloxacin Nausea And Vomiting   Hctz [Hydrochlorothiazide] Other (See Comments)    weakness and hyponatremia   Macrobid [Nitrofurantoin] Nausea Only   Other Nausea Only   Prednisone Other (See Comments)    Swelling and facial redness    Zofran [Ondansetron]    Sulfamethoxazole-Trimethoprim Other (See Comments) and Rash    Sore throat    Outpatient Encounter Medications as of 09/12/2022  Medication Sig   acetaminophen (TYLENOL) 500 MG tablet Take 1,000 mg by mouth every 8 (eight) hours as needed.   amLODipine (NORVASC) 5 MG tablet Take 5 mg by mouth daily.   carbidopa-levodopa (SINEMET IR) 25-100 MG tablet Take 1 tablet by mouth 4 (four) times daily. 0800, 1100, 1500, 2000   Carbidopa-Levodopa ER (SINEMET CR) 25-100 MG tablet controlled release Take 1 tablet by mouth at bedtime.   clonazePAM (KLONOPIN) 0.5 MG tablet Take 0.5 tablets (0.25 mg total) by mouth at bedtime.   CRANBERRY-VITAMIN C-D MANNOSE PO Take 1 tablet by mouth 3 (three) times daily.   irbesartan (AVAPRO) 150 MG tablet Take 150 mg by mouth daily.   levothyroxine (SYNTHROID) 150 MCG tablet Take 1 tablet (150 mcg total) by mouth daily before breakfast.   LORazepam (ATIVAN) 0.5 MG tablet Take 0.25 mg by mouth daily as needed for anxiety.   Multiple Vitamin (MULTIVITAMIN) tablet Take 1 tablet by mouth daily. Cranberry manose vit c   polyethylene glycol (MIRALAX / GLYCOLAX) 17 g packet Take 8.5 g by mouth daily.   QUEtiapine (SEROQUEL) 25 MG tablet Take 0.5 tablets (12.5 mg total) by mouth at bedtime. (Patient taking differently: Take 12.5 mg by mouth 2 (two) times daily.)   senna-docusate (SENOKOT-S) 8.6-50 MG tablet Take 1 tablet by mouth at bedtime.   traMADol (ULTRAM) 50 MG tablet Take 50 mg by mouth every 6 (six) hours as needed.   No facility-administered encounter medications on file as of 09/12/2022.    Review of Systems  Unable to perform ROS: Dementia     Immunization History  Administered Date(s) Administered   Covid-19, Mrna,Vaccine(Spikevax)21yrs and older 11/29/2021   DTP 01/24/1994   Influenza Split 10/09/2007, 10/17/2008, 10/23/2009, 11/18/2011, 11/21/2012, 12/10/2019   Influenza Whole 10/09/2007   Influenza,inj,Quad PF,6+ Mos 10/26/2010   Influenza-Unspecified 11/05/2013, 10/31/2014, 10/12/2015, 10/20/2016, 11/15/2017, 11/13/2020, 10/29/2021   Moderna Covid-19 Vaccine Bivalent Booster 30yrs & up 05/19/2022   Moderna SARS-COV2 Booster Vaccination 11/28/2019, 05/28/2020, 11/06/2020   Moderna Sars-Covid-2 Vaccination 01/29/2019, 02/28/2019, 06/28/2021   PPD Test 06/17/2021   Pneumococcal Conjugate-13 10/23/2014   Pneumococcal Polysaccharide-23 10/25/2002   Rsv, Bivalent, Protein Subunit Rsvpref,pf Verdis Frederickson) 02/08/2022  Tdap 05/23/2021   Zoster, Live 10/13/2005   Zoster, Unspecified 10/13/2005   Pertinent  Health Maintenance Due  Topic Date Due   INFLUENZA VACCINE  08/25/2022   DEXA SCAN  Completed   PAP SMEAR-Modifier  Discontinued      04/19/2021   11:00 PM 04/20/2021   12:00 PM 04/29/2021   12:25 PM 05/23/2021   12:01 PM 07/11/2022    4:41 PM  Fall Risk  Falls in the past year?     1  Was there an injury with Fall?     0  Fall Risk Category Calculator     2  (RETIRED) Patient Fall Risk Level High fall risk High fall risk Low fall risk Moderate fall risk   Patient at Risk for Falls Due to     History of fall(s);Impaired balance/gait;Impaired mobility  Fall risk Follow up     Falls evaluation completed   Functional Status Survey:    Vitals:   09/12/22 1059  BP: (!) 167/84  Pulse: 73  Resp: 18  Temp: 97.9 F (36.6 C)  TempSrc: Temporal  SpO2: 96%  Weight: 130 lb 6.4 oz (59.1 kg)  Height: 5\' 3"  (1.6 m)   Body mass index is 23.1 kg/m. Physical Exam Vitals and nursing note reviewed.  Constitutional:      General: She is not in acute distress.    Appearance: She is not diaphoretic.  HENT:     Head:  Normocephalic and atraumatic.     Mouth/Throat:     Mouth: Mucous membranes are moist.     Pharynx: Oropharynx is clear.     Comments: Dysphonia Eyes:     Conjunctiva/sclera: Conjunctivae normal.     Pupils: Pupils are equal, round, and reactive to light.  Neck:     Vascular: No JVD.  Cardiovascular:     Rate and Rhythm: Normal rate and regular rhythm.     Heart sounds: No murmur heard. Pulmonary:     Effort: Pulmonary effort is normal. No respiratory distress.     Breath sounds: Normal breath sounds. No wheezing.  Abdominal:     General: Bowel sounds are normal. There is no distension.     Palpations: Abdomen is soft.     Tenderness: There is no abdominal tenderness.  Musculoskeletal:     Comments: Rigidity to BUE and BLE  Skin:    General: Skin is warm and dry.  Neurological:     General: No focal deficit present.     Mental Status: She is alert. Mental status is at baseline.  Psychiatric:        Mood and Affect: Mood normal.     Labs reviewed: Recent Labs    09/13/21 0000 10/18/21 0000  NA 138 138  K 3.7 4.1  CL 101 103  CO2 30* 28*  BUN 25* 25*  CREATININE 0.7 0.9  CALCIUM 9.0 9.0   Recent Labs    09/13/21 0000  AST 17  ALT 8  ALKPHOS 77  ALBUMIN 3.9   Recent Labs    09/13/21 0000  WBC 6.5  HGB 12.5  HCT 37  PLT 212   Lab Results  Component Value Date   TSH 1.95 07/07/2022   Lab Results  Component Value Date   HGBA1C 5.6 10/15/2013   No results found for: "CHOL", "HDL", "LDLCALC", "LDLDIRECT", "TRIG", "CHOLHDL"  Significant Diagnostic Results in last 30 days:  No results found.  Assessment/Plan  1. Anxiety and depression Improved with seroquel BID Discussed  with her husband to give her more time to adjust to this medication and any lethargy as it does seem to greatly help her.   2. Hypothyroidism, postsurgical history of thyroid cancer TSH has stabilized. Husband concerned and asking if we can check one more time. Let him know we  would monitor closely and then back off to once a year TSH if stable.   3. Essential hypertension Had one elevated reading others are at goal on current therapy  4. Slow transit constipation Continue miralax and senokot.   5. Severe dementia due to Parkinson's disease, with psychotic disturbance (HCC) Continue clonazepam and seroquel Off exelon Under hospice care Continues to benefit from the skilled environment.   6. Parkinson's disease with dyskinesia and fluctuating manifestations Severe has been followed by neurology Currently on sinemet.   7. Age-related osteoporosis without current pathological fracture Not on therapy due to hospice status and no ambulatory status  8. Recurrent UTI Continue cranberry supplement Encourage fluids.    Family/ staff Communication: nurse and husband Mr. Courtade  Labs/tests ordered:  TSH one month

## 2022-10-13 LAB — TSH: TSH: 3.78 (ref 0.41–5.90)

## 2022-10-17 ENCOUNTER — Other Ambulatory Visit: Payer: Self-pay | Admitting: Adult Health

## 2022-10-17 ENCOUNTER — Other Ambulatory Visit: Payer: Self-pay | Admitting: Orthopedic Surgery

## 2022-10-17 DIAGNOSIS — F418 Other specified anxiety disorders: Secondary | ICD-10-CM

## 2022-10-17 MED ORDER — CLONAZEPAM 0.5 MG PO TABS
0.2500 mg | ORAL_TABLET | Freq: Every day | ORAL | 3 refills | Status: DC
Start: 2022-10-17 — End: 2023-05-22

## 2022-10-17 NOTE — Telephone Encounter (Signed)
Patient medication refilled today. Medication pend and sent to PCP Mahlon Gammon, MD

## 2022-10-21 ENCOUNTER — Non-Acute Institutional Stay (SKILLED_NURSING_FACILITY): Payer: Self-pay | Admitting: Adult Health

## 2022-10-21 ENCOUNTER — Encounter: Payer: Self-pay | Admitting: Adult Health

## 2022-10-21 DIAGNOSIS — G20B2 Parkinson's disease with dyskinesia, with fluctuations: Secondary | ICD-10-CM | POA: Diagnosis not present

## 2022-10-21 DIAGNOSIS — K5901 Slow transit constipation: Secondary | ICD-10-CM | POA: Diagnosis not present

## 2022-10-21 DIAGNOSIS — G20A1 Parkinson's disease without dyskinesia, without mention of fluctuations: Secondary | ICD-10-CM | POA: Diagnosis not present

## 2022-10-21 DIAGNOSIS — F419 Anxiety disorder, unspecified: Secondary | ICD-10-CM

## 2022-10-21 DIAGNOSIS — F02C2 Dementia in other diseases classified elsewhere, severe, with psychotic disturbance: Secondary | ICD-10-CM

## 2022-10-21 DIAGNOSIS — E890 Postprocedural hypothyroidism: Secondary | ICD-10-CM

## 2022-10-21 DIAGNOSIS — F32A Depression, unspecified: Secondary | ICD-10-CM

## 2022-10-21 DIAGNOSIS — I10 Essential (primary) hypertension: Secondary | ICD-10-CM

## 2022-10-21 MED ORDER — QUETIAPINE FUMARATE 25 MG PO TABS: 12.5000 mg | ORAL_TABLET | Freq: Two times a day (BID) | ORAL | Status: DC

## 2022-10-21 NOTE — Progress Notes (Signed)
Location:  [facility] Service:  SNF (31) Provider:  Tamsen Roers, MD  Patient Care Team: Mahlon Gammon, MD as PCP - General (Internal Medicine) Lyn Records, MD (Inactive) as Consulting Physician (Cardiology)  Extended Emergency Contact Information Primary Emergency Contact: Jacquelynn Cree Address: [address]          [place], [place] [address] [place] of [place] Home Phone: [phone] Work Phone: [phone] Mobile Phone: [phone] Relation: Spouse Secondary Emergency Contact: Darcella Cheshire , [place] Home Phone: [phone] Relation: Granddaughter  Code Status:  DNR Hospice  Goals of care: Advanced Directive information    09/12/2022   11:06 AM  Advanced Directives  Does Patient Have a Medical Advance Directive? Yes  Type of Advance Directive Out of facility DNR (pink MOST or yellow form)     Chief Complaint  Patient presents with   Medical Management of Chronic Issues    HPI:  Pt is a 89 y.o. female seen today for medical management of chronic diseases.    Enrolled in hospice due to PD, dementia, and functional decline.   Continues with PD symptoms of tremor at baseline. Also has slow movement, rigidity, and dysphonia.   She is very lucid today having full conversation. Has no acute complaints. ON Seroquel 12.5 mg bid started in August. Mood is improved. Still occasionally has anxiety and tearfulness. Using prn ativan with effectiveness. Did have one fall in September which was mechanical, no acute injury.   BP controlled now on norvasc and avapro  Bps reviewed in matrix.   Blood Pressure: 136 / 69 mmHg  Blood Pressure: 144 / 84 mmHg  Blood Pressure: 149 / 77 mmHg  Blood Pressure: 160 / 91 mmHg  Blood Pressure: 161 / 78 mmHg   MMSE 22/30 06/13/2021. CT head 05/23/21 noted atrophy with chronic small vessel ischemic changes. Off exelon due  to cost and lack of benefit She is able to communicate. Needs help with feeding and other ADls. Using a lift for transfer and wears briefs due to incontinence. Can eat finger foods.   S/p thyroidectomy due to thyroid ca, on synthroid TS 10/13/22 3.78  OP:T score -2.8 07/23/20, previous on reclast per Dr Sharl Ma in 2022.  Not currently on meds.   On miralax and senokot, BMs recorded regularly    Past Medical History:  Diagnosis Date   Anxiety    Arthritis    knee & back    Blood type, Rh negative    Breast cancer (HCC)    Chronic fatigue    Depression    Diverticulosis    Endocervical polyp    GERD (gastroesophageal reflux disease)    Hypertension    Hypothyroidism    IBS (irritable bowel syndrome)    Insomnia    Lymphedema    Neuromuscular disorder (HCC)    Parkinson's disease    Osteopenia 06/2015   T score -2.4 distal third of the radius   Parkinson disease    RBBB 10/07/2013   Thyroid cancer [facility]    Past Surgical History:  Procedure Laterality Date   APPENDECTOMY  1979   BREAST SURGERY  1979   MASTECTOMY AND RECONSTRUCTION   DILATION AND CURETTAGE OF UTERUS     MISCARRIAGE   EYE SURGERY Bilateral    w/ IOL   HYSTEROSCOPY  2005,2009   AND D&C   MASTECTOMY Bilateral  THYROIDECTOMY     TONSILLECTOMY     TOTAL KNEE ARTHROPLASTY Right 10/07/2013   Procedure: RIGHT TOTAL KNEE ARTHROPLASTY;  Surgeon: Nestor Lewandowsky, MD;  Location: [facility] OR;  Service: Orthopedics;  Laterality: Right;   TUBAL LIGATION      Allergies  Allergen Reactions   Sulfa Antibiotics Rash   Biaxin [Clarithromycin] Other (See Comments)    Pt does not remember reaction    Ciprofloxacin Nausea And Vomiting   Hctz [Hydrochlorothiazide] Other (See Comments)    weakness and hyponatremia   Macrobid [Nitrofurantoin] Nausea Only   Other Nausea Only   Prednisone Other (See Comments)    Swelling and facial redness    Zofran [Ondansetron]    Sulfamethoxazole-Trimethoprim Other (See Comments) and  Rash    Sore throat    Outpatient Encounter Medications as of 10/21/2022  Medication Sig   acetaminophen (TYLENOL) 500 MG tablet Take 1,000 mg by mouth every 8 (eight) hours as needed.   amLODipine (NORVASC) 5 MG tablet Take 5 mg by mouth daily.   carbidopa-levodopa (SINEMET IR) 25-100 MG tablet Take 1 tablet by mouth 4 (four) times daily. 0800, 1100, 1500, 2000   Carbidopa-Levodopa ER (SINEMET CR) 25-100 MG tablet controlled release Take 1 tablet by mouth at bedtime.   clonazePAM (KLONOPIN) 0.5 MG tablet TAKE 1/2 TABLET (0.25MG ) BY MOUTH AT BEDTIME FOR FOR ANXIETY (CONTROL)   clonazePAM (KLONOPIN) 0.5 MG tablet Take 0.5 tablets (0.25 mg total) by mouth at bedtime.   CRANBERRY-VITAMIN C-D MANNOSE PO Take 1 tablet by mouth 3 (three) times daily.   irbesartan (AVAPRO) 150 MG tablet Take 150 mg by mouth daily.   levothyroxine (SYNTHROID) 150 MCG tablet Take 1 tablet (150 mcg total) by mouth daily before breakfast.   LORazepam (ATIVAN) 0.5 MG tablet Take 0.25 mg by mouth daily as needed for anxiety.   Multiple Vitamin (MULTIVITAMIN) tablet Take 1 tablet by mouth daily. Cranberry manose vit c   polyethylene glycol (MIRALAX / GLYCOLAX) 17 g packet Take 8.5 g by mouth daily.   QUEtiapine (SEROQUEL) 25 MG tablet Take 0.5 tablets (12.5 mg total) by mouth at bedtime. (Patient taking differently: Take 12.5 mg by mouth 2 (two) times daily.)   senna-docusate (SENOKOT-S) 8.6-50 MG tablet Take 1 tablet by mouth at bedtime.   traMADol (ULTRAM) 50 MG tablet Take 50 mg by mouth every 6 (six) hours as needed.   No facility-administered encounter medications on file as of 10/21/2022.    Review of Systems  Unable to perform ROS: Dementia    Immunization History  Administered Date(s) Administered   DTP 01/24/1994   Influenza Split 10/09/2007, 10/17/2008, 10/23/2009, 11/18/2011, 11/21/2012, 12/10/2019   Influenza Whole 10/09/2007   Influenza,inj,Quad PF,6+ Mos 10/26/2010   Influenza-Unspecified 11/05/2013,  10/31/2014, 10/12/2015, 10/20/2016, 11/15/2017, 11/13/2020, 10/29/2021   Moderna Covid-19 Fall Seasonal Vaccine 59yrs & older 11/29/2021   Moderna Covid-19 Vaccine Bivalent Booster 63yrs & up 05/19/2022   Moderna SARS-COV2 Booster Vaccination 11/28/2019, 05/28/2020, 11/06/2020   Moderna Sars-Covid-2 Vaccination 01/29/2019, 02/28/2019, 06/28/2021   PPD Test 06/17/2021   Pneumococcal Conjugate-13 10/23/2014   Pneumococcal Polysaccharide-23 10/25/2002   Rsv, Bivalent, Protein Subunit Rsvpref,pf Verdis Frederickson) 02/08/2022   Tdap 05/23/2021   Zoster, Live 10/13/2005   Zoster, Unspecified 10/13/2005   Pertinent  Health Maintenance Due  Topic Date Due   INFLUENZA VACCINE  08/25/2022   DEXA SCAN  Completed      04/19/2021   11:00 PM 04/20/2021   12:00 PM 04/29/2021   12:25 PM 05/23/2021  12:01 PM 07/11/2022    4:41 PM  Fall Risk  Falls in the past year?     1  Was there an injury with Fall?     0  Fall Risk Category Calculator     2  (RETIRED) Patient Fall Risk Level High fall risk High fall risk Low fall risk Moderate fall risk   Patient at Risk for Falls Due to     History of fall(s);Impaired balance/gait;Impaired mobility  Fall risk Follow up     Falls evaluation completed   Functional Status Survey:    Vitals:   10/21/22 1310  BP: 136/69  Pulse: 71  Resp: 12  Temp: (!) 96.7 F (35.9 C)  SpO2: 96%  Weight: 134 lb (60.8 kg)   Body mass index is 23.74 kg/m. Physical Exam Vitals and nursing note reviewed.  Constitutional:      General: She is not in acute distress.    Appearance: She is not diaphoretic.  HENT:     Head: Normocephalic and atraumatic.     Mouth/Throat:     Mouth: Mucous membranes are moist.     Pharynx: Oropharynx is clear.     Comments: Dysphonia Eyes:     Conjunctiva/sclera: Conjunctivae normal.     Pupils: Pupils are equal, round, and reactive to light.  Neck:     Vascular: No JVD.  Cardiovascular:     Rate and Rhythm: Normal rate and regular rhythm.      Heart sounds: No murmur heard. Pulmonary:     Effort: Pulmonary effort is normal. No respiratory distress.     Breath sounds: Normal breath sounds. No wheezing.  Abdominal:     General: Bowel sounds are normal. There is no distension.     Palpations: Abdomen is soft.     Tenderness: There is no abdominal tenderness.  Musculoskeletal:     Comments: Rigidity to BUE and BLE  Skin:    General: Skin is warm and dry.  Neurological:     General: No focal deficit present.     Mental Status: She is alert. Mental status is at baseline.  Psychiatric:        Mood and Affect: Mood normal.     Labs reviewed: No results for input(s): "NA", "K", "CL", "CO2", "GLUCOSE", "BUN", "CREATININE", "CALCIUM", "MG", "PHOS" in the last 8760 hours.  No results for input(s): "AST", "ALT", "ALKPHOS", "BILITOT", "PROT", "ALBUMIN" in the last 8760 hours.  No results for input(s): "WBC", "NEUTROABS", "HGB", "HCT", "MCV", "PLT" in the last 8760 hours.  Lab Results  Component Value Date   TSH 1.95 07/07/2022   Lab Results  Component Value Date   HGBA1C 5.6 10/15/2013   No results found for: "CHOL", "HDL", "LDLCALC", "LDLDIRECT", "TRIG", "CHOLHDL"  Significant Diagnostic Results in last 30 days:  No results found.  Assessment/Plan  1. Anxiety and depression Improved  Continue Seroquel, clonazepam, and prn ativan  2. Hypothyroidism, postsurgical history of thyroid cancer TSH has stabilized.  Pharmacy is Government social research officer and recommended TSH in November.   3. Essential hypertension Controlled.  4. Slow transit constipation Continue miralax and senokot.   5. Severe dementia due to Parkinson's disease, with psychotic disturbance (HCC) Continue clonazepam and seroquel Under hospice care Continues to benefit from the skilled environment.   6. Parkinson's disease with dyskinesia and fluctuating manifestations Severe has been followed by neurology Currently on sinemet.   7. Age-related  osteoporosis without current pathological fracture Not on therapy due to hospice status and no  ambulatory status    Family/ staff Communication: nurse   Labs/tests ordered:  TSH Nov

## 2022-11-07 ENCOUNTER — Non-Acute Institutional Stay (SKILLED_NURSING_FACILITY): Payer: Self-pay | Admitting: Internal Medicine

## 2022-11-07 ENCOUNTER — Encounter: Payer: Self-pay | Admitting: Internal Medicine

## 2022-11-07 DIAGNOSIS — G20B2 Parkinson's disease with dyskinesia, with fluctuations: Secondary | ICD-10-CM | POA: Diagnosis not present

## 2022-11-07 DIAGNOSIS — E890 Postprocedural hypothyroidism: Secondary | ICD-10-CM

## 2022-11-07 DIAGNOSIS — F02C2 Dementia in other diseases classified elsewhere, severe, with psychotic disturbance: Secondary | ICD-10-CM

## 2022-11-07 DIAGNOSIS — G20A1 Parkinson's disease without dyskinesia, without mention of fluctuations: Secondary | ICD-10-CM

## 2022-11-07 DIAGNOSIS — F419 Anxiety disorder, unspecified: Secondary | ICD-10-CM | POA: Diagnosis not present

## 2022-11-07 DIAGNOSIS — F32A Depression, unspecified: Secondary | ICD-10-CM

## 2022-11-07 NOTE — Progress Notes (Signed)
Location:  [facility] Room Number: 130A Place of Service:  SNF (31)/Hospice Provider:  Mahlon Gammon, MD   Mahlon Gammon, MD  Patient Care Team: Mahlon Gammon, MD as PCP - General (Internal Medicine) Lyn Records, MD (Inactive) as Consulting Physician (Cardiology)  Extended Emergency Contact Information Primary Emergency Contact: Jacquelynn Cree Address: [address]          [place], [place] [address] [place] of [place] Home Phone: [phone] Work Phone: [phone] Mobile Phone: [phone] Relation: Spouse Secondary Emergency Contact: Darcella Cheshire , [place] Home Phone: [phone] Relation: Granddaughter  Code Status:  DNR/hospice Goals of care: Advanced Directive information    11/07/2022   10:41 AM  Advanced Directives  Does Patient Have a Medical Advance Directive? Yes  Type of Advance Directive Out of facility DNR (pink MOST or yellow form)  Does patient want to make changes to medical advance directive? No - Patient declined     Chief Complaint  Patient presents with   Medical Management of Chronic Issues    Patient is being seen for a routine visit    Immunizations    Patient is due for shingles, flu and covid vaccine     HPI:  Pt is a 89 y.o. female seen today for medical management of chronic diseases.    Lives in SNF in [place]  Enrolled in Hospice She has h/o  Parkinson.   h/o cognitive impairment  Hypertension Hypothyroidism Depression Urinary In continence Osteoporosis h/o Dyskinetic movement and slurred speech   Today patient was more sleepy and less responsive per her Nurse Her husband said she does that sometimes She would not open her eyes for me Otherwise she is stable No Falls Hoyer dependent Wt Readings from Last 3 Encounters:  11/07/22 134 lb (60.8 kg)  10/21/22 134 lb (60.8 kg)  09/12/22 130 lb 6.4 oz (59.1 kg)       Past Medical  History:  Diagnosis Date   Anxiety    Arthritis    knee & back    Blood type, Rh negative    Breast cancer (HCC)    Chronic fatigue    Depression    Diverticulosis    Endocervical polyp    GERD (gastroesophageal reflux disease)    Hypertension    Hypothyroidism    IBS (irritable bowel syndrome)    Insomnia    Lymphedema    Neuromuscular disorder (HCC)    Parkinson's disease    Osteopenia 06/2015   T score -2.4 distal third of the radius   Parkinson disease (HCC)    RBBB 10/07/2013   Thyroid cancer (HCC)    Past Surgical History:  Procedure Laterality Date   APPENDECTOMY  1979   BREAST SURGERY  1979   MASTECTOMY AND RECONSTRUCTION   DILATION AND CURETTAGE OF UTERUS     MISCARRIAGE   EYE SURGERY Bilateral    w/ IOL   HYSTEROSCOPY  2005,2009   AND D&C   MASTECTOMY Bilateral    THYROIDECTOMY     TONSILLECTOMY     TOTAL KNEE ARTHROPLASTY Right 10/07/2013   Procedure: RIGHT TOTAL KNEE ARTHROPLASTY;  Surgeon: Nestor Lewandowsky, MD;  Location: [facility] OR;  Service: Orthopedics;  Laterality: Right;   TUBAL LIGATION      Allergies  Allergen Reactions   Sulfa Antibiotics Rash   Biaxin [Clarithromycin] Other (See Comments)    Pt does not remember  reaction    Ciprofloxacin Nausea And Vomiting   Hctz [Hydrochlorothiazide] Other (See Comments)    weakness and hyponatremia   Macrobid [Nitrofurantoin] Nausea Only   Other Nausea Only   Prednisone Other (See Comments)    Swelling and facial redness    Zofran [Ondansetron]    Sulfamethoxazole-Trimethoprim Other (See Comments) and Rash    Sore throat    Outpatient Encounter Medications as of 11/07/2022  Medication Sig   acetaminophen (TYLENOL) 500 MG tablet Take 1,000 mg by mouth every 8 (eight) hours as needed.   amLODipine (NORVASC) 5 MG tablet Take 5 mg by mouth daily.   carbidopa-levodopa (SINEMET IR) 25-100 MG tablet Take 1 tablet by mouth 4 (four) times daily. 0800, 1100, 1500, 2000   Carbidopa-Levodopa ER (SINEMET CR)  25-100 MG tablet controlled release Take 1 tablet by mouth at bedtime.   clonazePAM (KLONOPIN) 0.5 MG tablet Take 0.5 tablets (0.25 mg total) by mouth at bedtime.   CRANBERRY-VITAMIN C-D MANNOSE PO Take 1 tablet by mouth 3 (three) times daily.   irbesartan (AVAPRO) 150 MG tablet Take 150 mg by mouth daily.   levothyroxine (SYNTHROID) 150 MCG tablet Take 1 tablet (150 mcg total) by mouth daily before breakfast.   Multiple Vitamin (MULTIVITAMIN) tablet Take 1 tablet by mouth daily. Cranberry manose vit c   polyethylene glycol (MIRALAX / GLYCOLAX) 17 g packet Take 8.5 g by mouth daily.   QUEtiapine (SEROQUEL) 25 MG tablet Take 0.5 tablets (12.5 mg total) by mouth 2 (two) times daily.   senna-docusate (SENOKOT-S) 8.6-50 MG tablet Take 1 tablet by mouth at bedtime.   traMADol (ULTRAM) 50 MG tablet Take 50 mg by mouth every 6 (six) hours as needed.   LORazepam (ATIVAN) 0.5 MG tablet Take 0.25 mg by mouth daily as needed for anxiety. (Patient not taking: Reported on 11/07/2022)   No facility-administered encounter medications on file as of 11/07/2022.    Review of Systems  Unable to perform ROS: Dementia    Immunization History  Administered Date(s) Administered   DTP 01/24/1994   Influenza Split 10/09/2007, 10/17/2008, 10/23/2009, 11/18/2011, 11/21/2012, 12/10/2019   Influenza Whole 10/09/2007   Influenza,inj,Quad PF,6+ Mos 10/26/2010   Influenza-Unspecified 11/05/2013, 10/31/2014, 10/12/2015, 10/20/2016, 11/15/2017, 11/13/2020, 10/29/2021   Moderna Covid-19 Fall Seasonal Vaccine 84yrs & older 11/29/2021   Moderna Covid-19 Vaccine Bivalent Booster 27yrs & up 05/19/2022   Moderna SARS-COV2 Booster Vaccination 11/28/2019, 05/28/2020, 11/06/2020   Moderna Sars-Covid-2 Vaccination 01/29/2019, 02/28/2019, 06/28/2021   PPD Test 06/17/2021   Pneumococcal Conjugate-13 10/23/2014   Pneumococcal Polysaccharide-23 10/25/2002   Rsv, Bivalent, Protein Subunit Rsvpref,pf Verdis Frederickson) 02/08/2022   Tdap  05/23/2021   Zoster, Live 10/13/2005   Zoster, Unspecified 10/13/2005   Pertinent  Health Maintenance Due  Topic Date Due   INFLUENZA VACCINE  08/25/2022   DEXA SCAN  Completed      04/19/2021   11:00 PM 04/20/2021   12:00 PM 04/29/2021   12:25 PM 05/23/2021   12:01 PM 07/11/2022    4:41 PM  Fall Risk  Falls in the past year?     1  Was there an injury with Fall?     0  Fall Risk Category Calculator     2  (RETIRED) Patient Fall Risk Level High fall risk High fall risk Low fall risk Moderate fall risk   Patient at Risk for Falls Due to     History of fall(s);Impaired balance/gait;Impaired mobility  Fall risk Follow up     Falls evaluation completed  Functional Status Survey:    Vitals:   11/07/22 1036  BP: 137/81  Pulse: 79  Resp: 17  Temp: (!) 97 F (36.1 C)  TempSrc: Temporal  SpO2: 94%  Weight: 134 lb (60.8 kg)  Height: 5\' 3"  (1.6 m)   Body mass index is 23.74 kg/m. Physical Exam Vitals reviewed.  Constitutional:      Comments: Patient had her eyes closed and would not talk to me today  HENT:     Head: Normocephalic.     Nose: Nose normal.     Mouth/Throat:     Mouth: Mucous membranes are moist.     Pharynx: Oropharynx is clear.  Eyes:     Pupils: Pupils are equal, round, and reactive to light.  Cardiovascular:     Rate and Rhythm: Normal rate and regular rhythm.     Pulses: Normal pulses.     Heart sounds: Normal heart sounds. No murmur heard. Pulmonary:     Effort: Pulmonary effort is normal.     Breath sounds: Normal breath sounds.  Abdominal:     General: Abdomen is flat. Bowel sounds are normal.     Palpations: Abdomen is soft.  Musculoskeletal:        General: No swelling.     Cervical back: Neck supple.  Skin:    General: Skin is warm.  Neurological:     Mental Status: She is alert.  Psychiatric:        Mood and Affect: Mood normal.        Thought Content: Thought content normal.     Labs reviewed: No results for input(s): "NA", "K",  "CL", "CO2", "GLUCOSE", "BUN", "CREATININE", "CALCIUM", "MG", "PHOS" in the last 8760 hours. No results for input(s): "AST", "ALT", "ALKPHOS", "BILITOT", "PROT", "ALBUMIN" in the last 8760 hours. No results for input(s): "WBC", "NEUTROABS", "HGB", "HCT", "MCV", "PLT" in the last 8760 hours. Lab Results  Component Value Date   TSH 1.95 07/07/2022   Lab Results  Component Value Date   HGBA1C 5.6 10/15/2013   No results found for: "CHOL", "HDL", "LDLCALC", "LDLDIRECT", "TRIG", "CHOLHDL"  Significant Diagnostic Results in last 30 days:  No results found.  Assessment/Plan 1. Parkinson's disease with dyskinesia and fluctuating manifestations (HCC) Sinemet  Now enrolled in Hospice   2. Anxiety and depression On Klonopin Will write to hold it if she continues to be more Sleepy tonight  3. Severe dementia due to Parkinson's disease, with psychotic disturbance (HCC) Needs full care with ADLS  4. Hypothyroidism, postsurgical history of thyroid cancer Last TSH normal in 9/24  5 Hypertension Amlodipine and Avapro  6 Increased Lethargy Patient has done this before that she would not talk and respond Hold off further work up  She is hospice Most of the time she responds again Will Hold off Seroquel and Klonopin if she is sleepy Will Continue to monitor Family/ staff Communication: Husband  Labs/tests ordered:

## 2022-12-05 ENCOUNTER — Encounter: Payer: Self-pay | Admitting: Adult Health

## 2022-12-05 ENCOUNTER — Non-Acute Institutional Stay (SKILLED_NURSING_FACILITY): Payer: Medicare Other | Admitting: Adult Health

## 2022-12-05 DIAGNOSIS — I10 Essential (primary) hypertension: Secondary | ICD-10-CM | POA: Diagnosis not present

## 2022-12-05 DIAGNOSIS — G20B2 Parkinson's disease with dyskinesia, with fluctuations: Secondary | ICD-10-CM | POA: Diagnosis not present

## 2022-12-05 DIAGNOSIS — G20A1 Parkinson's disease without dyskinesia, without mention of fluctuations: Secondary | ICD-10-CM | POA: Diagnosis not present

## 2022-12-05 DIAGNOSIS — F02C2 Dementia in other diseases classified elsewhere, severe, with psychotic disturbance: Secondary | ICD-10-CM

## 2022-12-05 DIAGNOSIS — E890 Postprocedural hypothyroidism: Secondary | ICD-10-CM | POA: Diagnosis not present

## 2022-12-05 DIAGNOSIS — K5901 Slow transit constipation: Secondary | ICD-10-CM

## 2022-12-05 NOTE — Progress Notes (Unsigned)
Location:  [facility] Room Number: 130A Place of Service:  SNF [phone]) Provider:  Tamsen Roers, MD  Patient Care Team: Mahlon Gammon, MD as PCP - General (Internal Medicine) Lyn Records, MD (Inactive) as Consulting Physician (Cardiology)  Extended Emergency Contact Information Primary Emergency Contact: Jacquelynn Cree Address: [address]          [place], [place] [address] [place] Home Phone: [phone] Work Phone: [phone] Mobile Phone: [phone] Relation: Spouse Secondary Emergency Contact: Darcella Cheshire , [place] Home Phone: [phone] Relation: Granddaughter  Code Status:  DNR Hospice Goals of care: Advanced Directive information    12/05/2022   11:18 AM  Advanced Directives  Does Patient Have a Medical Advance Directive? Yes  Type of Advance Directive Out of facility DNR (pink MOST or yellow form)  Does patient want to make changes to medical advance directive? No - Patient declined     Chief Complaint  Patient presents with  . Medical Management of Chronic Issues    Patient is being seen for a routine visit    HPI:  Pt is a 89 y.o. female seen today for medical management of chronic diseases.   Enrolled in hospice due to PD, dementia, and functional decline.   Continues with PD symptoms tremor,.slow movement, rigidity, and dysphonia. Having worsening rigidity in BLE  She has periods of alertness and periods of confusion and lethargy.  Still occasionally has anxiety and tearfulness. Using prn ativan with effectiveness.  BP running in the 160s systolic on norvasc and avapro  MMSE 22/30 06/13/2021. CT head 05/23/21 noted atrophy with chronic small vessel ischemic changes. Off exelon due to cost and lack of benefit She is able to communicate. Needs help with feeding and other ADls. Using a lift for transfer and wears briefs due to  incontinence. Can eat finger foods.   S/p thyroidectomy due to thyroid ca, on synthroid TS 10/13/22 3.78  OP:T score -2.8 07/23/20, previous on reclast per Dr Sharl Ma in 2022.  Not currently on meds.   On miralax and senokot, BMs recorded regularly  Past Medical History:  Diagnosis Date  . Anxiety   . Arthritis    knee & back   . Blood type, Rh negative   . Breast cancer (HCC)   . Chronic fatigue   . Depression   . Diverticulosis   . Endocervical polyp   . GERD (gastroesophageal reflux disease)   . Hypertension   . Hypothyroidism   . IBS (irritable bowel syndrome)   . Insomnia   . Lymphedema   . Neuromuscular disorder (HCC)    Parkinson's disease   . Osteopenia 06/2015   T score -2.4 distal third of the radius  . Parkinson disease (HCC)   . RBBB 10/07/2013  . Thyroid cancer [facility])    Past Surgical History:  Procedure Laterality Date  . APPENDECTOMY  1979  . BREAST SURGERY  1979   MASTECTOMY AND RECONSTRUCTION  . DILATION AND CURETTAGE OF UTERUS     MISCARRIAGE  . EYE SURGERY Bilateral    w/ IOL  . HYSTEROSCOPY  [id number]   AND D&C  . MASTECTOMY Bilateral   . THYROIDECTOMY    . TONSILLECTOMY    . TOTAL KNEE ARTHROPLASTY Right 10/07/2013   Procedure: RIGHT TOTAL KNEE ARTHROPLASTY;  Surgeon: Nestor Lewandowsky, MD;  Location: [facility] OR;  Service: Orthopedics;  Laterality: Right;  .  TUBAL LIGATION      Allergies  Allergen Reactions  . Sulfa Antibiotics Rash  . Biaxin [Clarithromycin] Other (See Comments)    Pt does not remember reaction   . Ciprofloxacin Nausea And Vomiting  . Hctz [Hydrochlorothiazide] Other (See Comments)    weakness and hyponatremia  . Macrobid [Nitrofurantoin] Nausea Only  . Other Nausea Only  . Prednisone Other (See Comments)    Swelling and facial redness   . Zofran [Ondansetron]   . Sulfamethoxazole-Trimethoprim Other (See Comments) and Rash    Sore throat    Outpatient Encounter Medications as of 12/05/2022  Medication Sig  .  acetaminophen (TYLENOL) 500 MG tablet Take 1,000 mg by mouth every 8 (eight) hours as needed.  Marland Kitchen amLODipine (NORVASC) 5 MG tablet Take 5 mg by mouth daily.  . carbidopa-levodopa (SINEMET IR) 25-100 MG tablet Take 1 tablet by mouth 4 (four) times daily. 0800, 1100, 1500, 2000  . Carbidopa-Levodopa ER (SINEMET CR) 25-100 MG tablet controlled release Take 1 tablet by mouth at bedtime.  . clonazePAM (KLONOPIN) 0.5 MG tablet Take 0.5 tablets (0.25 mg total) by mouth at bedtime.  Marland Kitchen CRANBERRY-VITAMIN C-D MANNOSE PO Take 1 tablet by mouth 3 (three) times daily.  . irbesartan (AVAPRO) 150 MG tablet Take 150 mg by mouth daily.  Marland Kitchen levothyroxine (SYNTHROID) 150 MCG tablet Take 1 tablet (150 mcg total) by mouth daily before breakfast.  . LORazepam (ATIVAN) 0.5 MG tablet Take 0.25 mg by mouth daily as needed for anxiety.  . Multiple Vitamin (MULTIVITAMIN) tablet Take 1 tablet by mouth daily. Cranberry manose vit c  . polyethylene glycol (MIRALAX / GLYCOLAX) 17 g packet Take 8.5 g by mouth daily.  . QUEtiapine (SEROQUEL) 25 MG tablet Take 0.5 tablets (12.5 mg total) by mouth 2 (two) times daily.  Marland Kitchen senna-docusate (SENOKOT-S) 8.6-50 MG tablet Take 1 tablet by mouth at bedtime.  . traMADol (ULTRAM) 50 MG tablet Take 50 mg by mouth every 6 (six) hours as needed.   No facility-administered encounter medications on file as of 12/05/2022.    Review of Systems  Unable to perform ROS: Dementia   Immunization History  Administered Date(s) Administered  . DTP 01/24/1994  . Fluad Quad(high Dose 65+) 11/15/2022  . Influenza Split 10/09/2007, 10/17/2008, 10/23/2009, 11/18/2011, 11/21/2012, 12/10/2019  . Influenza Whole 10/09/2007  . Influenza,inj,Quad PF,6+ Mos 10/26/2010  . Influenza-Unspecified 11/05/2013, 10/31/2014, 10/12/2015, 10/20/2016, 11/15/2017, 11/13/2020, 10/29/2021  . Moderna Covid-19 Fall Seasonal Vaccine 80yrs & older 11/29/2021  . Moderna Covid-19 Vaccine Bivalent Booster 84yrs & up 05/19/2022,  11/15/2022  . Moderna SARS-COV2 Booster Vaccination 11/28/2019, 05/28/2020, 11/06/2020  . Moderna Sars-Covid-2 Vaccination 01/29/2019, 02/28/2019, 06/28/2021  . PPD Test 06/17/2021  . Pneumococcal Conjugate-13 10/23/2014  . Pneumococcal Polysaccharide-23 10/25/2002  . Rsv, Bivalent, Protein Subunit Rsvpref,pf Verdis Frederickson) 02/08/2022  . Tdap 05/23/2021  . Zoster, Live 10/13/2005  . Zoster, Unspecified 10/13/2005   Pertinent  Health Maintenance Due  Topic Date Due  . INFLUENZA VACCINE  Completed  . DEXA SCAN  Completed      04/20/2021   12:00 PM 04/29/2021   12:25 PM 05/23/2021   12:01 PM 07/11/2022    4:41 PM 12/05/2022   11:17 AM  Fall Risk  Falls in the past year?    1 0  Was there an injury with Fall?    0 0  Fall Risk Category Calculator    2 0  (RETIRED) Patient Fall Risk Level High fall risk Low fall risk Moderate fall risk  Patient at Risk for Falls Due to    History of fall(s);Impaired balance/gait;Impaired mobility No Fall Risks  Fall risk Follow up    Falls evaluation completed Falls evaluation completed   Functional Status Survey:    Vitals:   12/05/22 1112  BP: (!) 167/64  Pulse: 80  Resp: 17  Temp: (!) 96.8 F (36 C)  TempSrc: Temporal  SpO2: 98%  Weight: 139 lb 3.2 oz (63.1 kg)  Height: 5\' 3"  (1.6 m)   Body mass index is 24.66 kg/m. Physical Exam Vitals and nursing note reviewed.  Constitutional:      General: She is not in acute distress.    Appearance: She is not diaphoretic.  HENT:     Head: Normocephalic and atraumatic.  Neck:     Vascular: No JVD.  Cardiovascular:     Rate and Rhythm: Normal rate and regular rhythm.     Heart sounds: No murmur heard. Pulmonary:     Effort: Pulmonary effort is normal. No respiratory distress.     Breath sounds: Normal breath sounds. No wheezing.  Abdominal:     General: Bowel sounds are normal. There is no distension.     Palpations: Abdomen is soft.     Tenderness: There is no abdominal tenderness.   Musculoskeletal:     Right lower leg: No edema.     Left lower leg: No edema.     Comments: Worsening rigidity to BUE and BLE  Skin:    General: Skin is warm and dry.  Neurological:     General: No focal deficit present.     Mental Status: She is alert. Mental status is at baseline.    Labs reviewed: No results for input(s): "NA", "K", "CL", "CO2", "GLUCOSE", "BUN", "CREATININE", "CALCIUM", "MG", "PHOS" in the last 8760 hours. No results for input(s): "AST", "ALT", "ALKPHOS", "BILITOT", "PROT", "ALBUMIN" in the last 8760 hours. No results for input(s): "WBC", "NEUTROABS", "HGB", "HCT", "MCV", "PLT" in the last 8760 hours. Lab Results  Component Value Date   TSH 3.78 10/13/2022   Lab Results  Component Value Date   HGBA1C 5.6 10/15/2013   No results found for: "CHOL", "HDL", "LDLCALC", "LDLDIRECT", "TRIG", "CHOLHDL"  Significant Diagnostic Results in last 30 days:  No results found.  Assessment/Plan  1. Essential hypertension Manual bps daily for 1 week   2. Parkinson's disease with dyskinesia and fluctuating manifestations (HCC) Worsening rigidity  Continues on sinemet Severe stage Currently enrolled in hospice.   3. Severe dementia due to Parkinson's disease, with psychotic disturbance (HCC) Periods of anxiety controlled with seroquel and clonazepam  4. Postoperative hypothyroidism Check TSH per pharmacy recommendation   5. Slow transit constipation Continue miralax and senokot.   Family/ staff Communication: nurse  Labs/tests ordered:  CBC CMP with next TSH

## 2022-12-08 ENCOUNTER — Encounter: Payer: Self-pay | Admitting: Adult Health

## 2022-12-13 LAB — HEPATIC FUNCTION PANEL
ALT: 10 U/L (ref 7–35)
AST: 15 (ref 13–35)
Alkaline Phosphatase: 103 (ref 25–125)
Bilirubin, Total: 0.5

## 2022-12-13 LAB — COMPREHENSIVE METABOLIC PANEL
Albumin: 3.4 — AB (ref 3.5–5.0)
Calcium: 8.6 — AB (ref 8.7–10.7)
Globulin: 2.6
eGFR: 87

## 2022-12-13 LAB — CBC AND DIFFERENTIAL
HCT: 36 (ref 36–46)
Hemoglobin: 12.3 (ref 12.0–16.0)
Platelets: 208 10*3/uL (ref 150–400)
WBC: 5.7

## 2022-12-13 LAB — BASIC METABOLIC PANEL
BUN: 28 — AB (ref 4–21)
CO2: 24 — AB (ref 13–22)
Chloride: 105 (ref 99–108)
Creatinine: 0.5 (ref 0.5–1.1)
Glucose: 92
Potassium: 4.1 meq/L (ref 3.5–5.1)
Sodium: 137 (ref 137–147)

## 2022-12-13 LAB — TSH: TSH: 0.67 (ref 0.41–5.90)

## 2022-12-13 LAB — CBC: RBC: 3 — AB (ref 3.87–5.11)

## 2023-01-06 ENCOUNTER — Non-Acute Institutional Stay (SKILLED_NURSING_FACILITY): Payer: Self-pay | Admitting: Adult Health

## 2023-01-06 ENCOUNTER — Encounter: Payer: Self-pay | Admitting: Adult Health

## 2023-01-06 DIAGNOSIS — G20A1 Parkinson's disease without dyskinesia, without mention of fluctuations: Secondary | ICD-10-CM | POA: Diagnosis not present

## 2023-01-06 DIAGNOSIS — L304 Erythema intertrigo: Secondary | ICD-10-CM | POA: Diagnosis not present

## 2023-01-06 DIAGNOSIS — E890 Postprocedural hypothyroidism: Secondary | ICD-10-CM

## 2023-01-06 DIAGNOSIS — K5901 Slow transit constipation: Secondary | ICD-10-CM

## 2023-01-06 DIAGNOSIS — F02C2 Dementia in other diseases classified elsewhere, severe, with psychotic disturbance: Secondary | ICD-10-CM

## 2023-01-06 DIAGNOSIS — I10 Essential (primary) hypertension: Secondary | ICD-10-CM | POA: Diagnosis not present

## 2023-01-06 DIAGNOSIS — G20B2 Parkinson's disease with dyskinesia, with fluctuations: Secondary | ICD-10-CM

## 2023-01-06 NOTE — Progress Notes (Signed)
Location:  [facility] Room Number: 130A Place of Service:  SNF [phone]) Provider:  Tamsen Roers, MD  Patient Care Team: Mahlon Gammon, MD as PCP - General (Internal Medicine) Lyn Records, MD (Inactive) as Consulting Physician (Cardiology)  Extended Emergency Contact Information Primary Emergency Contact: Jacquelynn Cree Address: [address]          [place], [place] [address] [place] Home Phone: [phone] Work Phone: [phone] Mobile Phone: [phone] Relation: Spouse Secondary Emergency Contact: Darcella Ches[place] , [place] Home Phone: [phone] Relation: Granddaughter  Code Status:  DNR Hospice Goals of care: Advanced Directive information    01/06/2023   10:24 AM  Advanced Directives  Does Patient Have a Medical Advance Directive? Yes  Type of Advance Directive Out of facility DNR (pink MOST or yellow form)  Does patient want to make changes to medical advance directive? No - Patient declined     Chief Complaint  Patient presents with   Medical Management of Chronic Issues    Patient is being seen for a routine    Immunizations    Patient is due for a shingles vaccine     HPI:  Pt is a 89 y.o. female seen today for medical management of chronic diseases.    Enrolled in hospice due to PD, dementia, and functional decline.   Continues with PD symptoms tremor,slow movement, rigidity, and dysphonia. Having worsening rigidity in BLE  She has a fungal rash to the right axilla. Staff applying nystatin powder.   Using ativan prn for anxiety and crying which is effective.   BP controlled   Parkinson's related dementia:  CT head 05/23/21 noted atrophy with chronic small vessel ischemic changes. Off exelon due to cost and lack of benefit She is able to communicate. Needs help with feeding and other ADls. Using a lift for transfer and wears  briefs due to incontinence.   S/p thyroidectomy due to thyroid ca, on synthroid TSH 0.67 12/13/22  OP:T score -2.8 07/23/20, previous on reclast per Dr Sharl Ma in 2022.  Not currently on meds.   On miralax and senokot, BMs recorded regularly Past Medical History:  Diagnosis Date   Anxiety    Arthritis    knee & back    Blood type, Rh negative    Breast cancer (HCC)    Chronic fatigue    Depression    Diverticulosis    Endocervical polyp    GERD (gastroesophageal reflux disease)    Hypertension    Hypothyroidism    IBS (irritable bowel syndrome)    Insomnia    Lymphedema    Neuromuscular disorder (HCC)    Parkinson's disease    Osteopenia 06/2015   T score -2.4 distal third of the radius   Parkinson disease (HCC)    RBBB 10/07/2013   Thyroid cancer (HCC)    Past Surgical History:  Procedure Laterality Date   APPENDECTOMY  1979   BREAST SURGERY  1979   MASTECTOMY AND RECONSTRUCTION   DILATION AND CURETTAGE OF UTERUS     MISCARRIAGE   EYE SURGERY Bilateral    w/ IOL   HYSTEROSCOPY  2005,2009   AND D&C   MASTECTOMY Bilateral    THYROIDECTOMY     TONSILLECTOMY     TOTAL KNEE ARTHROPLASTY Right 10/07/2013   Procedure: RIGHT TOTAL KNEE ARTHROPLASTY;  Surgeon: Nestor Lewandowsky, MD;  Location: [facility]  OR;  Service: Orthopedics;  Laterality: Right;   TUBAL LIGATION      Allergies  Allergen Reactions   Sulfa Antibiotics Rash   Biaxin [Clarithromycin] Other (See Comments)    Pt does not remember reaction    Ciprofloxacin Nausea And Vomiting   Hctz [Hydrochlorothiazide] Other (See Comments)    weakness and hyponatremia   Macrobid [Nitrofurantoin] Nausea Only   Other Nausea Only   Prednisone Other (See Comments)    Swelling and facial redness    Zofran [Ondansetron]    Sulfamethoxazole-Trimethoprim Other (See Comments) and Rash    Sore throat    Outpatient Encounter Medications as of 01/06/2023  Medication Sig   acetaminophen (TYLENOL) 500 MG tablet Take 1,000 mg by  mouth every 8 (eight) hours as needed.   amLODipine (NORVASC) 5 MG tablet Take 5 mg by mouth daily.   carbidopa-levodopa (SINEMET IR) 25-100 MG tablet Take 1 tablet by mouth 4 (four) times daily. 0800, 1100, 1500, 2000   Carbidopa-Levodopa ER (SINEMET CR) 25-100 MG tablet controlled release Take 1 tablet by mouth at bedtime.   clonazePAM (KLONOPIN) 0.5 MG tablet Take 0.5 tablets (0.25 mg total) by mouth at bedtime.   CRANBERRY-VITAMIN C-D MANNOSE PO Take 1 tablet by mouth 3 (three) times daily.   irbesartan (AVAPRO) 150 MG tablet Take 150 mg by mouth daily.   levothyroxine (SYNTHROID) 150 MCG tablet Take 1 tablet (150 mcg total) by mouth daily before breakfast.   LORazepam (ATIVAN) 0.5 MG tablet Take 0.25 mg by mouth daily as needed for anxiety.   Multiple Vitamin (MULTIVITAMIN) tablet Take 1 tablet by mouth daily. Cranberry manose vit c   polyethylene glycol (MIRALAX / GLYCOLAX) 17 g packet Take 8.5 g by mouth daily.   QUEtiapine (SEROQUEL) 25 MG tablet Take 0.5 tablets (12.5 mg total) by mouth 2 (two) times daily.   senna-docusate (SENOKOT-S) 8.6-50 MG tablet Take 1 tablet by mouth at bedtime.   traMADol (ULTRAM) 50 MG tablet Take 50 mg by mouth every 6 (six) hours as needed.   No facility-administered encounter medications on file as of 01/06/2023.    Review of Systems  Unable to perform ROS: Dementia    Immunization History  Administered Date(s) Administered   DTP 01/24/1994   Fluad Quad(high Dose 65+) 11/15/2022   Influenza Split 10/09/2007, 10/17/2008, 10/23/2009, 11/18/2011, 11/21/2012, 12/10/2019   Influenza Whole 10/09/2007   Influenza,inj,Quad PF,6+ Mos 10/26/2010   Influenza-Unspecified 11/05/2013, 10/31/2014, 10/12/2015, 10/20/2016, 11/15/2017, 11/13/2020, 10/29/2021   Moderna Covid-19 Fall Seasonal Vaccine 16yrs & older 11/29/2021   Moderna Covid-19 Vaccine Bivalent Booster 19yrs & up 05/19/2022, 11/15/2022   Moderna SARS-COV2 Booster Vaccination 11/28/2019, 05/28/2020,  11/06/2020   Moderna Sars-Covid-2 Vaccination 01/29/2019, 02/28/2019, 06/28/2021   PPD Test 06/17/2021   Pneumococcal Conjugate-13 10/23/2014   Pneumococcal Polysaccharide-23 10/25/2002   Rsv, Bivalent, Protein Subunit Rsvpref,pf Verdis Frederickson) 02/08/2022   Tdap 05/23/2021   Zoster, Live 10/13/2005   Zoster, Unspecified 10/13/2005   Pertinent  Health Maintenance Due  Topic Date Due   INFLUENZA VACCINE  Completed   DEXA SCAN  Completed      04/29/2021   12:25 PM 05/23/2021   12:01 PM 07/11/2022    4:41 PM 12/05/2022   11:17 AM 01/06/2023   10:24 AM  Fall Risk  Falls in the past year?   1 0 0  Was there an injury with Fall?   0 0 0  Fall Risk Category Calculator   2 0 0  (RETIRED) Patient Fall Risk Level Low fall  risk Moderate fall risk     Patient at Risk for Falls Due to   History of fall(s);Impaired balance/gait;Impaired mobility No Fall Risks No Fall Risks  Fall risk Follow up   Falls evaluation completed Falls evaluation completed Falls evaluation completed   Functional Status Survey:    Vitals:   01/06/23 1020  BP: 138/82  Pulse: 78  Resp: 17  Temp: (!) 97 F (36.1 C)  TempSrc: Temporal  SpO2: 92%  Weight: 136 lb 6.4 oz (61.9 kg)  Height: 5\' 3"  (1.6 m)   Body mass index is 24.16 kg/m. Wt Readings from Last 3 Encounters:  01/06/23 136 lb 6.4 oz (61.9 kg)  12/05/22 139 lb 3.2 oz (63.1 kg)  11/07/22 134 lb (60.8 kg)    Physical Exam Vitals and nursing note reviewed.  Constitutional:      General: She is not in acute distress.    Appearance: She is not diaphoretic.  HENT:     Head: Normocephalic and atraumatic.  Neck:     Vascular: No JVD.  Cardiovascular:     Rate and Rhythm: Normal rate and regular rhythm.     Heart sounds: No murmur heard. Pulmonary:     Effort: Pulmonary effort is normal. No respiratory distress.     Breath sounds: Normal breath sounds. No wheezing.  Abdominal:     General: Bowel sounds are normal. There is no distension.      Palpations: Abdomen is soft.     Tenderness: There is no abdominal tenderness.  Musculoskeletal:     Right lower leg: No edema.     Left lower leg: No edema.  Skin:    General: Skin is warm and dry.     Findings: Rash (macular erythematous to right axilla) present.  Neurological:     General: No focal deficit present.     Mental Status: She is alert. Mental status is at baseline.     Comments: Rigidity to BUE and BLE  Psychiatric:        Mood and Affect: Mood normal.     Labs reviewed: No results for input(s): "NA", "K", "CL", "CO2", "GLUCOSE", "BUN", "CREATININE", "CALCIUM", "MG", "PHOS" in the last 8760 hours. No results for input(s): "AST", "ALT", "ALKPHOS", "BILITOT", "PROT", "ALBUMIN" in the last 8760 hours. No results for input(s): "WBC", "NEUTROABS", "HGB", "HCT", "MCV", "PLT" in the last 8760 hours. Lab Results  Component Value Date   TSH 3.78 10/13/2022   Lab Results  Component Value Date   HGBA1C 5.6 10/15/2013   No results found for: "CHOL", "HDL", "LDLCALC", "LDLDIRECT", "TRIG", "CHOLHDL"  Significant Diagnostic Results in last 30 days:  No results found.  Assessment/Plan  1. Intertrigo (Primary) Continue nystatin powder per nursing protocol   2. Postoperative hypothyroidism Continue levothyroxine   3. Essential hypertension Controlled Continue Avapro and Norvasc   4. Severe dementia due to Parkinson's disease, with psychotic disturbance (HCC) Goals of care are comfort based Continue prn ativan for agitation and clonazepam at bedtime.  Also continue seroquel for agitation and paranoia   5. Parkinson's disease with dyskinesia and fluctuating manifestations (HCC) Under hospice care Progressive stiffness over time  Continue sinemet Supportive care.   6. Slow transit constipation Continue miralax and senokot    Family/ staff Communication: resident   Labs/tests ordered:  NA

## 2023-02-06 ENCOUNTER — Encounter: Payer: Self-pay | Admitting: Internal Medicine

## 2023-02-06 ENCOUNTER — Non-Acute Institutional Stay (SKILLED_NURSING_FACILITY): Payer: Self-pay | Admitting: Internal Medicine

## 2023-02-06 DIAGNOSIS — F32A Depression, unspecified: Secondary | ICD-10-CM

## 2023-02-06 DIAGNOSIS — F419 Anxiety disorder, unspecified: Secondary | ICD-10-CM | POA: Diagnosis not present

## 2023-02-06 DIAGNOSIS — F02C2 Dementia in other diseases classified elsewhere, severe, with psychotic disturbance: Secondary | ICD-10-CM

## 2023-02-06 DIAGNOSIS — I10 Essential (primary) hypertension: Secondary | ICD-10-CM

## 2023-02-06 DIAGNOSIS — G20B2 Parkinson's disease with dyskinesia, with fluctuations: Secondary | ICD-10-CM

## 2023-02-06 DIAGNOSIS — G20A1 Parkinson's disease without dyskinesia, without mention of fluctuations: Secondary | ICD-10-CM

## 2023-02-06 DIAGNOSIS — E890 Postprocedural hypothyroidism: Secondary | ICD-10-CM

## 2023-02-06 NOTE — Progress Notes (Signed)
 Location:  [facility] Room Number: 130A Place of Service:  SNF [phone]) Provider:  Charlanne Fredia CROME, MD   Charlanne Fredia CROME, MD  Patient Care Team: Charlanne Fredia CROME, MD as PCP - General (Internal Medicine) Claudene Victory ORN, MD (Inactive) as Consulting Physician (Cardiology)  Extended Emergency Contact Information Primary Emergency Contact: Ostlund,Erwin Address: [address]          [place], [place] [address] [place] Home Phone: [phone] Work Phone: [phone] Mobile Phone: [phone] Relation: Spouse Secondary Emergency Contact: Hypolite,Theodore          [place]  , [place] Home Phone: [phone] Relation: Granddaughter  Code Status:  DNR Hospice Goals of care: Advanced Directive information    02/06/2023    3:52 PM  Advanced Directives  Does Patient Have a Medical Advance Directive? Yes  Type of Advance Directive Out of facility DNR (pink MOST or yellow form)  Does patient want to make changes to medical advance directive? No - Patient declined     Chief Complaint  Patient presents with   Medical Management of Chronic Issues    Patient is being seen for a routine visit     HPI:  Pt is a 89 y.o. female seen today for medical management of chronic diseases.    Lives in SNF in [place]   Enrolled in Hospice She has h/o  Parkinson.   h/o cognitive impairment  Hypertension Hypothyroidism Depression Urinary In continence Osteoporosis h/o Dyskinetic movement and slurred speech      She is stable. No new Nursing issues. No Behavior issues Her weight is stable Hoyer Lift for transfers Has days when she does not respond and Sleeps Speech slurred Husband in the room no new issues No Falls Wt Readings from Last 3 Encounters:  02/06/23 140 lb (63.5 kg)  01/06/23 136 lb 6.4 oz (61.9 kg)  12/05/22 139 lb 3.2 oz (63.1 kg)   Past Medical History:  Diagnosis Date   Anxiety    Arthritis     knee & back    Blood type, Rh negative    Breast cancer (HCC)    Chronic fatigue    Depression    Diverticulosis    Endocervical polyp    GERD (gastroesophageal reflux disease)    Hypertension    Hypothyroidism    IBS (irritable bowel syndrome)    Insomnia    Lymphedema    Neuromuscular disorder (HCC)    Parkinson's disease    Osteopenia 06/2015   T score -2.4 distal third of the radius   Parkinson disease (HCC)    RBBB 10/07/2013   Thyroid  cancer (HCC)    Past Surgical History:  Procedure Laterality Date   APPENDECTOMY  1979   BREAST SURGERY  1979   MASTECTOMY AND RECONSTRUCTION   DILATION AND CURETTAGE OF UTERUS     MISCARRIAGE   EYE SURGERY Bilateral    w/ IOL   HYSTEROSCOPY  2005,2009   AND D&C   MASTECTOMY Bilateral    THYROIDECTOMY     TONSILLECTOMY     TOTAL KNEE ARTHROPLASTY Right 10/07/2013   Procedure: RIGHT TOTAL KNEE ARTHROPLASTY;  Surgeon: Dempsey JINNY Sensor, MD;  Location: [facility] OR;  Service: Orthopedics;  Laterality: Right;   TUBAL LIGATION      Allergies  Allergen Reactions   Sulfa Antibiotics Rash   Biaxin [Clarithromycin] Other (See Comments)    Pt does not remember reaction    Ciprofloxacin Nausea And Vomiting  Hctz [Hydrochlorothiazide] Other (See Comments)    weakness and hyponatremia   Macrobid  [Nitrofurantoin ] Nausea Only   Other Nausea Only   Prednisone Other (See Comments)    Swelling and facial redness    Zofran  [Ondansetron ]    Sulfamethoxazole-Trimethoprim Other (See Comments) and Rash    Sore throat    Outpatient Encounter Medications as of 02/06/2023  Medication Sig   acetaminophen  (TYLENOL ) 500 MG tablet Take 1,000 mg by mouth every 8 (eight) hours as needed.   amLODipine  (NORVASC ) 5 MG tablet Take 5 mg by mouth daily.   carbidopa -levodopa  (SINEMET  IR) 25-100 MG tablet Take 1 tablet by mouth 4 (four) times daily. 0800, 1100, 1500, 2000   Carbidopa -Levodopa  ER (SINEMET  CR) 25-100 MG tablet controlled release Take 1 tablet by mouth at  bedtime.   clonazePAM  (KLONOPIN ) 0.5 MG tablet Take 0.5 tablets (0.25 mg total) by mouth at bedtime.   CRANBERRY-VITAMIN C -D MANNOSE PO Take 1 tablet by mouth 3 (three) times daily.   irbesartan  (AVAPRO ) 150 MG tablet Take 150 mg by mouth daily.   levothyroxine  (SYNTHROID ) 150 MCG tablet Take 1 tablet (150 mcg total) by mouth daily before breakfast.   LORazepam  (ATIVAN ) 0.5 MG tablet Take 0.25 mg by mouth daily as needed for anxiety.   polyethylene glycol (MIRALAX  / GLYCOLAX ) 17 g packet Take 8.5 g by mouth daily.   QUEtiapine  (SEROQUEL ) 25 MG tablet Take 0.5 tablets (12.5 mg total) by mouth 2 (two) times daily.   senna-docusate (SENOKOT-S) 8.6-50 MG tablet Take 1 tablet by mouth at bedtime.   traMADol  (ULTRAM ) 50 MG tablet Take 50 mg by mouth every 6 (six) hours as needed.   Multiple Vitamin (MULTIVITAMIN) tablet Take 1 tablet by mouth daily. Cranberry manose vit c   No facility-administered encounter medications on file as of 02/06/2023.    Review of Systems  Unable to perform ROS: Dementia    Immunization History  Administered Date(s) Administered   DTP 01/24/1994   Fluad Quad(high Dose 65+) 11/15/2022   Influenza Split 10/09/2007, 10/17/2008, 10/23/2009, 11/18/2011, 11/21/2012, 12/10/2019   Influenza Whole 10/09/2007   Influenza,inj,Quad PF,6+ Mos 10/26/2010   Influenza-Unspecified 11/05/2013, 10/31/2014, 10/12/2015, 10/20/2016, 11/15/2017, 11/13/2020, 10/29/2021   Moderna Covid-19 Fall Seasonal Vaccine 78yrs & older 11/29/2021   Moderna Covid-19 Vaccine Bivalent Booster 38yrs & up 05/19/2022, 11/15/2022   Moderna SARS-COV2 Booster Vaccination 11/28/2019, 05/28/2020, 11/06/2020   Moderna Sars-Covid-2 Vaccination 01/29/2019, 02/28/2019, 06/28/2021   PPD Test 06/17/2021   Pneumococcal Conjugate-13 10/23/2014   Pneumococcal Polysaccharide-23 10/25/2002   Rsv, Bivalent, Protein Subunit Rsvpref,pf Marlow) 02/08/2022   Tdap 05/23/2021   Zoster, Live 10/13/2005   Zoster,  Unspecified 10/13/2005   Pertinent  Health Maintenance Due  Topic Date Due   INFLUENZA VACCINE  Completed   DEXA SCAN  Completed      04/29/2021   12:25 PM 05/23/2021   12:01 PM 07/11/2022    4:41 PM 12/05/2022   11:17 AM 01/06/2023   10:24 AM  Fall Risk  Falls in the past year?   1 0 0  Was there an injury with Fall?   0 0 0  Fall Risk Category Calculator   2 0 0  (RETIRED) Patient Fall Risk Level Low fall risk Moderate fall risk     Patient at Risk for Falls Due to   History of fall(s);Impaired balance/gait;Impaired mobility No Fall Risks No Fall Risks  Fall risk Follow up   Falls evaluation completed Falls evaluation completed Falls evaluation completed   Functional Status Survey:  Vitals:   02/06/23 1549  BP: (!) 153/82  Pulse: 69  Resp: 18  Temp: 97.8 F (36.6 C)  TempSrc: Temporal  SpO2: 96%  Weight: 140 lb (63.5 kg)  Height: 5' 3 (1.6 m)   Body mass index is 24.8 kg/m. Physical Exam Vitals reviewed.  Constitutional:      Comments: Responds but speech slurred  HENT:     Head: Normocephalic.     Nose: Nose normal.     Mouth/Throat:     Mouth: Mucous membranes are moist.     Pharynx: Oropharynx is clear.  Eyes:     Pupils: Pupils are equal, round, and reactive to light.  Cardiovascular:     Rate and Rhythm: Normal rate and regular rhythm.     Pulses: Normal pulses.     Heart sounds: Normal heart sounds. No murmur heard. Pulmonary:     Effort: Pulmonary effort is normal.     Breath sounds: Normal breath sounds.  Abdominal:     General: Abdomen is flat. Bowel sounds are normal.     Palpations: Abdomen is soft.  Musculoskeletal:        General: No swelling.     Cervical back: Neck supple.  Skin:    General: Skin is warm.  Neurological:     Mental Status: She is alert.  Psychiatric:        Mood and Affect: Mood normal.        Thought Content: Thought content normal.    Labs reviewed: Recent Labs    12/13/22 0000  NA 137  K 4.1  CL 105   CO2 24*  BUN 28*  CREATININE 0.5  CALCIUM 8.6*   Recent Labs    12/13/22 0000  AST 15  ALT 10  ALKPHOS 103  ALBUMIN 3.4*   Recent Labs    12/13/22 0000  WBC 5.7  HGB 12.3  HCT 36  PLT 208   Lab Results  Component Value Date   TSH 0.67 12/13/2022   Lab Results  Component Value Date   HGBA1C 5.6 10/15/2013   No results found for: CHOL, HDL, LDLCALC, LDLDIRECT, TRIG, CHOLHDL  Significant Diagnostic Results in last 30 days:  No results found.  Assessment/Plan 1. Parkinson's disease with dyskinesia and fluctuating manifestations (HCC) (Primary) Sinemet   Enrolled on Hospice  2. 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 Anxiety and depression Klonopin  and Seroquel   4. Hypothyroidism, postsurgical history of thyroid  cancer TSH normal in 11/24  5. Essential hypertension Avapro  and Amlodipine     Family/ staff Communication:   Labs/tests ordered:

## 2023-02-23 ENCOUNTER — Encounter: Payer: Self-pay | Admitting: Adult Health

## 2023-02-23 ENCOUNTER — Non-Acute Institutional Stay (SKILLED_NURSING_FACILITY): Payer: Medicare Other | Admitting: Adult Health

## 2023-02-23 DIAGNOSIS — J101 Influenza due to other identified influenza virus with other respiratory manifestations: Secondary | ICD-10-CM | POA: Diagnosis not present

## 2023-02-23 DIAGNOSIS — R051 Acute cough: Secondary | ICD-10-CM | POA: Diagnosis not present

## 2023-02-23 MED ORDER — IPRATROPIUM-ALBUTEROL 0.5-2.5 (3) MG/3ML IN SOLN: 3.0000 mL | Freq: Two times a day (BID) | RESPIRATORY_TRACT | Status: DC

## 2023-02-23 MED ORDER — OSELTAMIVIR PHOSPHATE 75 MG PO CAPS: 75.0000 mg | ORAL_CAPSULE | Freq: Two times a day (BID) | ORAL | Status: AC

## 2023-02-23 NOTE — Progress Notes (Signed)
Location:  Medical illustrator of Service:  SNF (31) Provider:   Peggye Ley, ANP [facility] [phone]   Mahlon Gammon, MD  Patient Care Team: Mahlon Gammon, MD as PCP - General (Internal Medicine) Lyn Records, MD (Inactive) as Consulting Physician (Cardiology)  Extended Emergency Contact Information Primary Emergency Contact: [facility] Address: [address]          [place], [place] [address] [place] Home Phone: [phone] Work Phone: [phone] Mobile Phone: [phone] Relation: Spouse Secondary Emergency Contact: Darcella Cheshire , [place] Home Phone: [phone] Relation: Granddaughter  Code Status:  DNR hospice  Goals of care: Advanced Directive information    02/06/2023    3:52 PM  Advanced Directives  Does Patient Have a Medical Advance Directive? Yes  Type of Advance Directive Out of facility DNR (pink MOST or yellow form)  Does patient want to make changes to medical advance directive? No - Patient declined     Chief Complaint  Patient presents with   Acute Visit    Cough    HPI:  Pt is a 89 y.o. female seen today for an acute visit for a cough   History of Present Illness The patient is a 89 year old female with Parkinson's disease and dementia who presents with cough and nasal congestion. She is accompanied by her husband, who is very concerned about her oral intake.  She has been experiencing cough and nasal congestion for three days. A rapid COVID and flu swab was performed a couple of days ago, which returned negative results. However, a subsequent rapid flu swab today was positive for influenza A. No fever or shortness of breath. There are no gastrointestinal symptoms.  She has had decreased oral intake starting one day ago. Her husband is concerned about this, and the nursing staff reported that she consumed only 25% of her breakfast  with a small amount of fluid. Despite this, she is voiding and having bowel movements.  Her oxygen saturation has been between 88% and 92%.  Results LABS Rapid COVID-19 swab: Negative (02/21/2023) Rapid influenza A swab: Positive (02/21/2023)   Past Medical History:  Diagnosis Date   Anxiety    Arthritis    knee & back    Blood type, Rh negative    Breast cancer (HCC)    Chronic fatigue    Depression    Diverticulosis    Endocervical polyp    GERD (gastroesophageal reflux disease)    Hypertension    Hypothyroidism    IBS (irritable bowel syndrome)    Insomnia    Lymphedema    Neuromuscular disorder (HCC)    Parkinson's disease    Osteopenia 06/2015   T score -2.4 distal third of the radius   Parkinson disease (HCC)    RBBB 10/07/2013   Thyroid cancer (HCC)    Past Surgical History:  Procedure Laterality Date   APPENDECTOMY  1979   BREAST SURGERY  1979   MASTECTOMY AND RECONSTRUCTION   DILATION AND CURETTAGE OF UTERUS     MISCARRIAGE   EYE SURGERY Bilateral    w/ IOL   HYSTEROSCOPY  2005,2009   AND D&C   MASTECTOMY Bilateral    THYROIDECTOMY     TONSILLECTOMY     TOTAL KNEE ARTHROPLASTY Right 10/07/2013   Procedure: RIGHT TOTAL KNEE ARTHROPLASTY;  Surgeon: Nestor Lewandowsky, MD;  Location: [facility] OR;  Service: Orthopedics;  Laterality: Right;   TUBAL LIGATION      Allergies  Allergen Reactions   Sulfa Antibiotics Rash   Biaxin [Clarithromycin] Other (See Comments)    Pt does not remember reaction    Ciprofloxacin Nausea And Vomiting   Hctz [Hydrochlorothiazide] Other (See Comments)    weakness and hyponatremia   Macrobid [Nitrofurantoin] Nausea Only   Other Nausea Only   Prednisone Other (See Comments)    Swelling and facial redness    Zofran [Ondansetron]    Sulfamethoxazole-Trimethoprim Other (See Comments) and Rash    Sore throat    Outpatient Encounter Medications as of 02/23/2023  Medication Sig   guaiFENesin (MUCINEX) 600 MG 12 hr tablet Take 600  mg by mouth 2 (two) times daily.   ipratropium-albuterol (DUONEB) 0.5-2.5 (3) MG/3ML SOLN Take 3 mLs by nebulization 2 (two) times daily for 5 days.   oseltamivir (TAMIFLU) 75 MG capsule Take 1 capsule (75 mg total) by mouth 2 (two) times daily for 5 days.   sodium chloride (OCEAN) 0.65 % SOLN nasal spray Place 2 sprays into both nostrils every 2 (two) hours as needed for congestion.   acetaminophen (TYLENOL) 500 MG tablet Take 1,000 mg by mouth every 8 (eight) hours as needed.   amLODipine (NORVASC) 5 MG tablet Take 5 mg by mouth daily.   carbidopa-levodopa (SINEMET IR) 25-100 MG tablet Take 1 tablet by mouth 4 (four) times daily. 0800, 1100, 1500, 2000   Carbidopa-Levodopa ER (SINEMET CR) 25-100 MG tablet controlled release Take 1 tablet by mouth at bedtime.   clonazePAM (KLONOPIN) 0.5 MG tablet Take 0.5 tablets (0.25 mg total) by mouth at bedtime.   CRANBERRY-VITAMIN C-D MANNOSE PO Take 1 tablet by mouth 3 (three) times daily.   irbesartan (AVAPRO) 150 MG tablet Take 150 mg by mouth daily.   levothyroxine (SYNTHROID) 150 MCG tablet Take 1 tablet (150 mcg total) by mouth daily before breakfast.   LORazepam (ATIVAN) 0.5 MG tablet Take 0.25 mg by mouth daily as needed for anxiety.   Multiple Vitamin (MULTIVITAMIN) tablet Take 1 tablet by mouth daily. Cranberry manose vit c   polyethylene glycol (MIRALAX / GLYCOLAX) 17 g packet Take 8.5 g by mouth daily.   QUEtiapine (SEROQUEL) 25 MG tablet Take 0.5 tablets (12.5 mg total) by mouth 2 (two) times daily.   senna-docusate (SENOKOT-S) 8.6-50 MG tablet Take 1 tablet by mouth at bedtime.   traMADol (ULTRAM) 50 MG tablet Take 50 mg by mouth every 6 (six) hours as needed.   No facility-administered encounter medications on file as of 02/23/2023.    Review of Systems  Unable to perform ROS: Dementia    Immunization History  Administered Date(s) Administered   DTP 01/24/1994   Fluad Quad(high Dose 65+) 11/15/2022   Influenza Split 10/09/2007,  10/17/2008, 10/23/2009, 11/18/2011, 11/21/2012, 12/10/2019   Influenza Whole 10/09/2007   Influenza,inj,Quad PF,6+ Mos 10/26/2010   Influenza-Unspecified 11/05/2013, 10/31/2014, 10/12/2015, 10/20/2016, 11/15/2017, 11/13/2020, 10/29/2021   Moderna Covid-19 Fall Seasonal Vaccine 77yrs & older 11/29/2021   Moderna Covid-19 Vaccine Bivalent Booster 4yrs & up 05/19/2022, 11/15/2022   Moderna SARS-COV2 Booster Vaccination 11/28/2019, 05/28/2020, 11/06/2020   Moderna Sars-Covid-2 Vaccination 01/29/2019, 02/28/2019, 06/28/2021   PPD Test 06/17/2021   Pneumococcal Conjugate-13 10/23/2014   Pneumococcal Polysaccharide-23 10/25/2002   Rsv, Bivalent, Protein Subunit Rsvpref,pf Verdis Frederickson) 02/08/2022   Tdap 05/23/2021   Zoster, Live 10/13/2005   Zoster, Unspecified 10/13/2005   Pertinent  Health Maintenance Due  Topic Date Due   INFLUENZA VACCINE  Completed   DEXA SCAN  Completed      04/29/2021   12:25 PM 05/23/2021   12:01 PM 07/11/2022    4:41 PM 12/05/2022   11:17 AM 01/06/2023   10:24 AM  Fall Risk  Falls in the past year?   1 0 0  Was there an injury with Fall?   0 0 0  Fall Risk Category Calculator   2 0 0  (RETIRED) Patient Fall Risk Level Low fall risk Moderate fall risk     Patient at Risk for Falls Due to   History of fall(s);Impaired balance/gait;Impaired mobility No Fall Risks No Fall Risks  Fall risk Follow up   Falls evaluation completed Falls evaluation completed Falls evaluation completed   Functional Status Survey:    Vitals:   02/23/23 1504  BP: 122/83  Pulse: 72  Resp: 15  Temp: (!) 97.3 F (36.3 C)  SpO2: 91%   There is no height or weight on file to calculate BMI. Physical Exam Vitals and nursing note reviewed.  Constitutional:      General: She is not in acute distress.    Appearance: She is not diaphoretic.     Comments: Lethargic   HENT:     Head: Normocephalic and atraumatic.     Right Ear: Tympanic membrane and ear canal normal.     Left Ear:  Tympanic membrane and ear canal normal.     Nose: Congestion present.     Mouth/Throat:     Mouth: Mucous membranes are dry.     Pharynx: Oropharynx is clear.  Eyes:     Conjunctiva/sclera: Conjunctivae normal.     Pupils: Pupils are equal, round, and reactive to light.  Neck:     Vascular: No JVD.  Cardiovascular:     Rate and Rhythm: Normal rate and regular rhythm.     Heart sounds: No murmur heard. Pulmonary:     Effort: Pulmonary effort is normal. No respiratory distress.     Breath sounds: Wheezing and rhonchi present.  Abdominal:     General: Bowel sounds are normal. There is no distension.     Palpations: Abdomen is soft.     Tenderness: There is no abdominal tenderness. There is no guarding.  Musculoskeletal:     Right lower leg: No edema.     Left lower leg: No edema.  Skin:    General: Skin is warm and dry.  Neurological:     Comments: Sleepy but arouses to physical stim. Not able to f/c      Labs reviewed: Recent Labs    12/13/22 0000  NA 137  K 4.1  CL 105  CO2 24*  BUN 28*  CREATININE 0.5  CALCIUM 8.6*   Recent Labs    12/13/22 0000  AST 15  ALT 10  ALKPHOS 103  ALBUMIN 3.4*   Recent Labs    12/13/22 0000  WBC 5.7  HGB 12.3  HCT 36  PLT 208   Lab Results  Component Value Date   TSH 0.67 12/13/2022   Lab Results  Component Value Date   HGBA1C 5.6 10/15/2013   No results found for: "CHOL", "HDL", "LDLCALC", "LDLDIRECT", "TRIG", "CHOLHDL"  Significant Diagnostic Results in last 30 days:  No results found.  Assessment and Plan Influenza A Positive rapid flu swab with symptoms of cough and nasal congestion for three days. No fever or GI symptoms. Decreased oral intake for one day. Oxygen saturation 88-92%. -Start Tamiflu 75mg  twice a day  for five days. -Encourage fluids every two hours while awake and monitor intake. -Notify hospice if concerns about dehydration arise over the weekend. -Schedule Duoneb's BID for cough.  Possible  Pneumonia Abnormal lung sounds and lethargy in the setting of influenza A infection. No fever or shortness of breath. -Order chest x-ray due to concerns from husband and clinical findings.  Family/ staff Communication: Mr Alguire and nurse   Labs/tests ordered:  CXR

## 2023-03-03 ENCOUNTER — Non-Acute Institutional Stay (SKILLED_NURSING_FACILITY): Payer: Self-pay | Admitting: Adult Health

## 2023-03-03 DIAGNOSIS — J101 Influenza due to other identified influenza virus with other respiratory manifestations: Secondary | ICD-10-CM

## 2023-03-03 DIAGNOSIS — R5383 Other fatigue: Secondary | ICD-10-CM | POA: Diagnosis not present

## 2023-03-05 ENCOUNTER — Encounter: Payer: Self-pay | Admitting: Adult Health

## 2023-03-05 MED ORDER — IPRATROPIUM-ALBUTEROL 0.5-2.5 (3) MG/3ML IN SOLN: 3.0000 mL | Freq: Four times a day (QID) | RESPIRATORY_TRACT | Status: DC | PRN

## 2023-03-05 NOTE — Progress Notes (Addendum)
 Location:  Medical Illustrator of Service:  SNF (31) hospice Provider:   Bari America, ANP [facility] [phone]   Charlanne Fredia CROME, MD  Patient Care Team: Charlanne Fredia CROME, MD as PCP - General (Internal Medicine) Claudene Victory ORN, MD (Inactive) as Consulting Physician (Cardiology)  Extended Emergency Contact Information Primary Emergency Contact: [facility] Address: [address]          [place], [place] [address] [place] Home Phone: [phone] Work Phone: [phone] Mobile Phone: [phone] Relation: Spouse Secondary Emergency Contact: Bowen,Theodore          [place]  , [place] Home Phone: [phone] Relation: Granddaughter  Code Status:  DNR hospice  Goals of care: Advanced Directive information    02/06/2023    3:52 PM  Advanced Directives  Does Patient Have a Medical Advance Directive? Yes  Type of Advance Directive Out of facility DNR (pink MOST or yellow form)  Does patient want to make changes to medical advance directive? No - Patient declined     Chief Complaint  Patient presents with   Acute Visit    Concern for lethargy and f/u flu    HPI:  Pt is a 89 y.o. female seen today for an acute visit for lethargy and f/u regarding Flu A.    She was seen for cough and fever on 02/23/23 and found to have Flu A. She was prescribed Tamiflu  and neb treatments. She also had a CXR which did not show an acute process.   Her husband requested that I come check on her due to lethargy and concern for her cough. She ate breakfast this morning as per usual and then fell asleep. She has periods of lethargy due to underlying Parkinson's with dementia. She is on seroquel  for anxiety and agitation.   Mr Scipio is very concerned about her overall care. He worries that she will not get better from the flu and that she will not get enough to eat and drink. The nurses report she is eating and  drinking. She has a personal care giver as well. Further more her sats are normal. She did have a temp this am of 100 which normalized later. The nurse reports the neb treatment help with the cough significantly. For my visit she is resting and in no acute distress.  Past Medical History:  Diagnosis Date   Anxiety    Arthritis    knee & back    Blood type, Rh negative    Breast cancer (HCC)    Chronic fatigue    Depression    Diverticulosis    Endocervical polyp    GERD (gastroesophageal reflux disease)    Hypertension    Hypothyroidism    IBS (irritable bowel syndrome)    Insomnia    Lymphedema    Neuromuscular disorder (HCC)    Parkinson's disease    Osteopenia 06/2015   T score -2.4 distal third of the radius   Parkinson disease (HCC)    RBBB 10/07/2013   Thyroid  cancer (HCC)    Past Surgical History:  Procedure Laterality Date   APPENDECTOMY  1979   BREAST SURGERY  1979   MASTECTOMY AND RECONSTRUCTION   DILATION AND CURETTAGE OF UTERUS     MISCARRIAGE   EYE SURGERY Bilateral    w/ IOL   HYSTEROSCOPY  2005,2009   AND D&C   MASTECTOMY Bilateral    THYROIDECTOMY     TONSILLECTOMY  TOTAL KNEE ARTHROPLASTY Right 10/07/2013   Procedure: RIGHT TOTAL KNEE ARTHROPLASTY;  Surgeon: Dempsey JINNY Sensor, MD;  Location: [facility] OR;  Service: Orthopedics;  Laterality: Right;   TUBAL LIGATION      Allergies  Allergen Reactions   Sulfa Antibiotics Rash   Biaxin [Clarithromycin] Other (See Comments)    Pt does not remember reaction    Ciprofloxacin Nausea And Vomiting   Hctz [Hydrochlorothiazide] Other (See Comments)    weakness and hyponatremia   Macrobid  [Nitrofurantoin ] Nausea Only   Other Nausea Only   Prednisone Other (See Comments)    Swelling and facial redness    Zofran  [Ondansetron ]    Sulfamethoxazole-Trimethoprim Other (See Comments) and Rash    Sore throat    Outpatient Encounter Medications as of 03/03/2023  Medication Sig   acetaminophen  (TYLENOL ) 500 MG tablet  Take 1,000 mg by mouth every 8 (eight) hours as needed.   amLODipine  (NORVASC ) 5 MG tablet Take 5 mg by mouth daily.   carbidopa -levodopa  (SINEMET  IR) 25-100 MG tablet Take 1 tablet by mouth 4 (four) times daily. 0800, 1100, 1500, 2000   Carbidopa -Levodopa  ER (SINEMET  CR) 25-100 MG tablet controlled release Take 1 tablet by mouth at bedtime.   clonazePAM  (KLONOPIN ) 0.5 MG tablet Take 0.5 tablets (0.25 mg total) by mouth at bedtime.   CRANBERRY-VITAMIN C -D MANNOSE PO Take 1 tablet by mouth 3 (three) times daily.   irbesartan  (AVAPRO ) 150 MG tablet Take 150 mg by mouth daily.   levothyroxine  (SYNTHROID ) 150 MCG tablet Take 1 tablet (150 mcg total) by mouth daily before breakfast.   LORazepam  (ATIVAN ) 0.5 MG tablet Take 0.25 mg by mouth daily as needed for anxiety.   Multiple Vitamin (MULTIVITAMIN) tablet Take 1 tablet by mouth daily. Cranberry manose vit c   polyethylene glycol (MIRALAX  / GLYCOLAX ) 17 g packet Take 8.5 g by mouth daily.   QUEtiapine  (SEROQUEL ) 25 MG tablet Take 0.5 tablets (12.5 mg total) by mouth 2 (two) times daily.   senna-docusate (SENOKOT-S) 8.6-50 MG tablet Take 1 tablet by mouth at bedtime.   traMADol  (ULTRAM ) 50 MG tablet Take 50 mg by mouth every 6 (six) hours as needed.   No facility-administered encounter medications on file as of 03/03/2023.    Review of Systems  Unable to perform ROS: Dementia    Immunization History  Administered Date(s) Administered   DTP 01/24/1994   Fluad Quad(high Dose 65+) 11/15/2022   Influenza Split 10/09/2007, 10/17/2008, 10/23/2009, 11/18/2011, 11/21/2012, 12/10/2019   Influenza Whole 10/09/2007   Influenza,inj,Quad PF,6+ Mos 10/26/2010   Influenza-Unspecified 11/05/2013, 10/31/2014, 10/12/2015, 10/20/2016, 11/15/2017, 11/13/2020, 10/29/2021   Moderna Covid-19 Fall Seasonal Vaccine 24yrs & older 11/29/2021   Moderna Covid-19 Vaccine Bivalent Booster 53yrs & up 05/19/2022, 11/15/2022   Moderna SARS-COV2 Booster Vaccination  11/28/2019, 05/28/2020, 11/06/2020   Moderna Sars-Covid-2 Vaccination 01/29/2019, 02/28/2019, 06/28/2021   PPD Test 06/17/2021   Pneumococcal Conjugate-13 10/23/2014   Pneumococcal Polysaccharide-23 10/25/2002   Rsv, Bivalent, Protein Subunit Rsvpref,pf Marlow) 02/08/2022   Tdap 05/23/2021   Zoster, Live 10/13/2005   Zoster, Unspecified 10/13/2005   Pertinent  Health Maintenance Due  Topic Date Due   INFLUENZA VACCINE  Completed   DEXA SCAN  Completed      04/29/2021   12:25 PM 05/23/2021   12:01 PM 07/11/2022    4:41 PM 12/05/2022   11:17 AM 01/06/2023   10:24 AM  Fall Risk  Falls in the past year?   1 0 0  Was there an injury with Fall?  0 0 0  Fall Risk Category Calculator   2 0 0  (RETIRED) Patient Fall Risk Level Low fall risk Moderate fall risk     Patient at Risk for Falls Due to   History of fall(s);Impaired balance/gait;Impaired mobility No Fall Risks No Fall Risks  Fall risk Follow up   Falls evaluation completed Falls evaluation completed Falls evaluation completed   Functional Status Survey:    Vitals:   03/05/23 0821  BP: 124/89  Pulse: 80  Resp: 20  Temp: 100 F (37.8 C)  SpO2: 94%   There is no height or weight on file to calculate BMI. Physical Exam Vitals and nursing note reviewed.  Constitutional:      General: She is not in acute distress.    Appearance: She is not diaphoretic.     Comments: Lethargic   HENT:     Head: Normocephalic and atraumatic.     Nose: No congestion.     Mouth/Throat:     Mouth: Mucous membranes are moist.     Pharynx: Oropharynx is clear.  Eyes:     Conjunctiva/sclera: Conjunctivae normal.     Pupils: Pupils are equal, round, and reactive to light.  Neck:     Vascular: No JVD.  Cardiovascular:     Rate and Rhythm: Normal rate and regular rhythm.     Heart sounds: No murmur heard. Pulmonary:     Effort: Pulmonary effort is normal. No respiratory distress.     Breath sounds: Normal breath sounds. No wheezing or  rhonchi.  Abdominal:     General: Bowel sounds are normal. There is no distension.     Palpations: Abdomen is soft.     Tenderness: There is no abdominal tenderness. There is no guarding.  Musculoskeletal:     Right lower leg: No edema.     Left lower leg: No edema.  Skin:    General: Skin is warm and dry.  Neurological:     Comments: Sleepy but arouses to physical stim. Not able to f/c      Labs reviewed: Recent Labs    12/13/22 0000  NA 137  K 4.1  CL 105  CO2 24*  BUN 28*  CREATININE 0.5  CALCIUM 8.6*   Recent Labs    12/13/22 0000  AST 15  ALT 10  ALKPHOS 103  ALBUMIN 3.4*   Recent Labs    12/13/22 0000  WBC 5.7  HGB 12.3  HCT 36  PLT 208   Lab Results  Component Value Date   TSH 0.67 12/13/2022   Lab Results  Component Value Date   HGBA1C 5.6 10/15/2013   No results found for: CHOL, HDL, LDLCALC, LDLDIRECT, TRIG, CHOLHDL  Significant Diagnostic Results in last 30 days:  No results found.  Assessment and Plan  Influenza A She appears improved on exam from my prior visit.  Lungs are clear to auscultation and she is eating and drinking I suggest the nurse monitor her symptoms and temp and if things worsen we can obtain another xray. While Ms. KANDICE is on hospice her husband is not ready for full comfort care. He would like issues treated in facility as they arise. She should be encouraged to drink oral fluid and have her vitals monitored  Lethargy This is not uncommon for her but she may be more fatigued due to her illness. I suggest they hold the am seroquel  when she is lethargic and let me know if things do not improve  Family/ staff Communication: Mr Ertle and nurse   Labs/tests ordered: NA

## 2023-03-05 NOTE — Addendum Note (Signed)
 Addended by: Americo Baker on: 03/05/2023 08:45 AM   Modules accepted: Level of Service

## 2023-03-10 ENCOUNTER — Non-Acute Institutional Stay (SKILLED_NURSING_FACILITY): Payer: Self-pay | Admitting: Adult Health

## 2023-03-10 DIAGNOSIS — J40 Bronchitis, not specified as acute or chronic: Secondary | ICD-10-CM | POA: Diagnosis not present

## 2023-03-11 ENCOUNTER — Encounter: Payer: Self-pay | Admitting: Adult Health

## 2023-03-11 MED ORDER — IPRATROPIUM-ALBUTEROL 0.5-2.5 (3) MG/3ML IN SOLN: 3.0000 mL | Freq: Four times a day (QID) | RESPIRATORY_TRACT | Status: DC | PRN

## 2023-03-11 MED ORDER — CEFDINIR 300 MG PO CAPS: 300.0000 mg | ORAL_CAPSULE | Freq: Two times a day (BID) | ORAL | Status: AC

## 2023-03-11 MED ORDER — CEFDINIR 300 MG PO CAPS: 300.0000 mg | ORAL_CAPSULE | Freq: Two times a day (BID) | ORAL | Status: DC

## 2023-03-11 NOTE — Progress Notes (Signed)
 [facility]  Skilled care  Provider:  Peggye Ley, ANP [facility] [phone]   Code Status: DNR hospice Goals of Care:     02/06/2023    3:52 PM  Advanced Directives  Does Patient Have a Medical Advance Directive? Yes  Type of Advance Directive Out of facility DNR (pink MOST or yellow form)  Does patient want to make changes to medical advance directive? No - Patient declined     Chief Complaint  Patient presents with   Acute Visit    No improvement in cough    HPI: Patient is a 89 y.o. female seen today for an acute visit for cough   She was seen for cough and fever on 02/23/23 and found to have Flu A. She was prescribed Tamiflu and neb treatments. She also had a CXR which did not show an acute process at that time.  Her husband has been very concerned about her cough and lethargy. Hospice ordered another CXR done 2/13 which showed bronchitis and peribronchial cuffing.  The nurse reports she does improve with nebs.  She is eating and drinking with encouragement No fevers or decreased 02 sats No GI symptoms   She is followed by hospice for dementia and parkinson's disease.   Past Medical History:  Diagnosis Date   Anxiety    Arthritis    knee & back    Blood type, Rh negative    Breast cancer (HCC)    Chronic fatigue    Depression    Diverticulosis    Endocervical polyp    GERD (gastroesophageal reflux disease)    Hypertension    Hypothyroidism    IBS (irritable bowel syndrome)    Insomnia    Lymphedema    Neuromuscular disorder (HCC)    Parkinson's disease    Osteopenia 06/2015   T score -2.4 distal third of the radius   Parkinson disease (HCC)    RBBB 10/07/2013   Thyroid cancer (HCC)     Past Surgical History:  Procedure Laterality Date   APPENDECTOMY  1979   BREAST SURGERY  1979   MASTECTOMY AND RECONSTRUCTION   DILATION AND CURETTAGE OF UTERUS     MISCARRIAGE   EYE SURGERY Bilateral    w/ IOL    HYSTEROSCOPY  2005,2009   AND D&C   MASTECTOMY Bilateral    THYROIDECTOMY     TONSILLECTOMY     TOTAL KNEE ARTHROPLASTY Right 10/07/2013   Procedure: RIGHT TOTAL KNEE ARTHROPLASTY;  Surgeon: Nestor Lewandowsky, MD;  Location: [facility] OR;  Service: Orthopedics;  Laterality: Right;   TUBAL LIGATION      Allergies  Allergen Reactions   Sulfa Antibiotics Rash   Biaxin [Clarithromycin] Other (See Comments)    Pt does not remember reaction    Ciprofloxacin Nausea And Vomiting   Hctz [Hydrochlorothiazide] Other (See Comments)    weakness and hyponatremia   Macrobid [Nitrofurantoin] Nausea Only   Other Nausea Only   Prednisone Other (See Comments)    Swelling and facial redness    Zofran [Ondansetron]    Sulfamethoxazole-Trimethoprim Other (See Comments) and Rash    Sore throat    Outpatient Encounter Medications as of 03/10/2023  Medication Sig   cefdinir (OMNICEF) 300 MG capsule Take 1 capsule (300 mg total) by mouth 2 (two) times daily for 7 days.   acetaminophen (TYLENOL) 500 MG tablet Take 1,000 mg by mouth every 8 (eight) hours as needed.   amLODipine (NORVASC) 5 MG tablet  Take 5 mg by mouth daily.   carbidopa-levodopa (SINEMET IR) 25-100 MG tablet Take 1 tablet by mouth 4 (four) times daily. 0800, 1100, 1500, 2000   Carbidopa-Levodopa ER (SINEMET CR) 25-100 MG tablet controlled release Take 1 tablet by mouth at bedtime.   clonazePAM (KLONOPIN) 0.5 MG tablet Take 0.5 tablets (0.25 mg total) by mouth at bedtime.   CRANBERRY-VITAMIN C-D MANNOSE PO Take 1 tablet by mouth 3 (three) times daily.   ipratropium-albuterol (DUONEB) 0.5-2.5 (3) MG/3ML SOLN Take 3 mLs by nebulization every 6 (six) hours as needed for up to 5 days.   irbesartan (AVAPRO) 150 MG tablet Take 150 mg by mouth daily.   levothyroxine (SYNTHROID) 150 MCG tablet Take 1 tablet (150 mcg total) by mouth daily before breakfast.   LORazepam (ATIVAN) 0.5 MG tablet Take 0.25 mg by mouth daily as needed for anxiety.   Multiple  Vitamin (MULTIVITAMIN) tablet Take 1 tablet by mouth daily. Cranberry manose vit c   polyethylene glycol (MIRALAX / GLYCOLAX) 17 g packet Take 8.5 g by mouth daily.   QUEtiapine (SEROQUEL) 25 MG tablet Take 0.5 tablets (12.5 mg total) by mouth 2 (two) times daily.   senna-docusate (SENOKOT-S) 8.6-50 MG tablet Take 1 tablet by mouth at bedtime.   traMADol (ULTRAM) 50 MG tablet Take 50 mg by mouth every 6 (six) hours as needed.   No facility-administered encounter medications on file as of 03/10/2023.    Review of Systems:  Review of Systems  Constitutional:  Positive for activity change and fatigue. Negative for appetite change, chills, diaphoresis, fever and unexpected weight change.  HENT:  Positive for congestion.   Respiratory:  Positive for cough and wheezing. Negative for shortness of breath.   Cardiovascular:  Negative for chest pain, palpitations and leg swelling.  Gastrointestinal:  Negative for abdominal distention, abdominal pain, constipation and diarrhea.  Genitourinary:  Negative for difficulty urinating and dysuria.  Musculoskeletal:  Positive for gait problem. Negative for arthralgias, back pain, joint swelling and myalgias.  Neurological:  Negative for dizziness, tremors, seizures, syncope, facial asymmetry, speech difficulty, weakness, light-headedness, numbness and headaches.  Psychiatric/Behavioral:  Positive for confusion. Negative for agitation and behavioral problems.     Health Maintenance  Topic Date Due   COVID-19 Vaccine (7 - 2024-25 season) 01/10/2023   Cervical Cancer Screening (Pap smear)  10/21/2023 (Originally 03/15/2013)   Zoster Vaccines- Shingrix (1 of 2) 12/25/2023 (Originally 03/09/1948)   DTaP/Tdap/Td (3 - Td or Tdap) 05/24/2031   Pneumonia Vaccine 67+ Years old  Completed   INFLUENZA VACCINE  Completed   DEXA SCAN  Completed   HPV VACCINES  Aged Out    Physical Exam: Vitals:   03/11/23 1030  Temp: (!) 97.2 F (36.2 C)  SpO2: 97%   There is  no height or weight on file to calculate BMI. Physical Exam Vitals and nursing note reviewed.  Constitutional:      General: She is not in acute distress.    Appearance: She is not diaphoretic.  HENT:     Head: Normocephalic and atraumatic.     Nose: Congestion present.     Mouth/Throat:     Comments: Not able to open Neck:     Vascular: No JVD.  Cardiovascular:     Rate and Rhythm: Normal rate and regular rhythm.     Heart sounds: No murmur heard. Pulmonary:     Effort: Pulmonary effort is normal. No respiratory distress.     Breath sounds: Wheezing present. No rhonchi.  Abdominal:     General: Bowel sounds are normal. There is no distension.     Palpations: Abdomen is soft.     Tenderness: There is no abdominal tenderness.  Skin:    General: Skin is warm and dry.  Neurological:     General: No focal deficit present.     Mental Status: She is alert. Mental status is at baseline.     Labs reviewed: Basic Metabolic Panel: Recent Labs    07/07/22 0000 10/13/22 0000 12/13/22 0000  NA  --   --  137  K  --   --  4.1  CL  --   --  105  CO2  --   --  24*  BUN  --   --  28*  CREATININE  --   --  0.5  CALCIUM  --   --  8.6*  TSH 1.95 3.78 0.67   Liver Function Tests: Recent Labs    12/13/22 0000  AST 15  ALT 10  ALKPHOS 103  ALBUMIN 3.4*   No results for input(s): "LIPASE", "AMYLASE" in the last 8760 hours. No results for input(s): "AMMONIA" in the last 8760 hours. CBC: Recent Labs    12/13/22 0000  WBC 5.7  HGB 12.3  HCT 36  PLT 208   Lipid Panel: No results for input(s): "CHOL", "HDL", "LDLCALC", "TRIG", "CHOLHDL", "LDLDIRECT" in the last 8760 hours. Lab Results  Component Value Date   HGBA1C 5.6 10/15/2013    Procedures since last visit: No results found.  Assessment/Plan  1. Bronchitis  Her cough is not improving and her husband is very concerned.   2nd CXR showing peribronchial cuffing and signs of bronchitis Has an allergy to  prednisone Continue duonebs and cough syrup  - cefdinir (OMNICEF) 300 MG capsule; Take 1 capsule (300 mg total) by mouth 2 (two) times daily for 7 days.

## 2023-03-21 ENCOUNTER — Other Ambulatory Visit: Payer: Self-pay | Admitting: Orthopedic Surgery

## 2023-03-21 DIAGNOSIS — M545 Other chronic pain: Secondary | ICD-10-CM

## 2023-03-21 MED ORDER — TRAMADOL HCL 50 MG PO TABS: 50.0000 mg | ORAL_TABLET | Freq: Four times a day (QID) | ORAL | 0 refills | Status: DC | PRN

## 2023-03-30 ENCOUNTER — Non-Acute Institutional Stay (SKILLED_NURSING_FACILITY): Payer: Self-pay | Admitting: Adult Health

## 2023-03-30 ENCOUNTER — Encounter: Payer: Self-pay | Admitting: Adult Health

## 2023-03-30 DIAGNOSIS — G20A1 Parkinson's disease without dyskinesia, without mention of fluctuations: Secondary | ICD-10-CM | POA: Diagnosis not present

## 2023-03-30 DIAGNOSIS — E890 Postprocedural hypothyroidism: Secondary | ICD-10-CM | POA: Diagnosis not present

## 2023-03-30 DIAGNOSIS — M5137 Other intervertebral disc degeneration, lumbosacral region with discogenic back pain only: Secondary | ICD-10-CM

## 2023-03-30 DIAGNOSIS — I10 Essential (primary) hypertension: Secondary | ICD-10-CM | POA: Diagnosis not present

## 2023-03-30 DIAGNOSIS — K5901 Slow transit constipation: Secondary | ICD-10-CM | POA: Diagnosis not present

## 2023-03-30 DIAGNOSIS — F02C2 Dementia in other diseases classified elsewhere, severe, with psychotic disturbance: Secondary | ICD-10-CM

## 2023-03-30 DIAGNOSIS — M810 Age-related osteoporosis without current pathological fracture: Secondary | ICD-10-CM

## 2023-03-30 DIAGNOSIS — G20B2 Parkinson's disease with dyskinesia, with fluctuations: Secondary | ICD-10-CM

## 2023-03-30 DIAGNOSIS — R269 Unspecified abnormalities of gait and mobility: Secondary | ICD-10-CM

## 2023-03-30 NOTE — Progress Notes (Signed)
 Location:  [facility] Room Number: 130A Place of Service:  SNF (31) hospice  Provider:  Fletcher Anon, NP  Mahlon Gammon, MD  Patient Care Team: Mahlon Gammon, MD as PCP - General (Internal Medicine) Lyn Records, MD (Inactive) as Consulting Physician (Cardiology)  Extended Emergency Contact Information Primary Emergency Contact: Jacquelynn Cree Address: [address]          [place], [place] [address] [place] Home Phone: [phone] Work Phone: [phone] Mobile Phone: [phone] Relation: Spouse Secondary Emergency Contact: Darcella Cheshire , [place] Home Phone: [phone] Relation: Granddaughter  Code Status:  DNR Hospice Goals of care: Advanced Directive information    03/30/2023    4:26 PM  Advanced Directives  Does Patient Have a Medical Advance Directive? Yes  Type of Advance Directive Out of facility DNR (pink MOST or yellow form)  Does patient want to make changes to medical advance directive? No - Patient declined     Chief Complaint  Patient presents with   Medical Management of Chronic Issues    Routine visit    HPI:  Pt is a 89 y.o. female seen today for medical management of chronic diseases.     Enrolled in hospice due to PD, dementia, and functional decline.   Continues with PD symptoms tremor,slow movement, rigidity, and dysphonia. Having worsening rigidity in BLE  She had Flu A in Feb and later was given omnicef for bronchitis for prolonged symptoms. Her cough resolved. Her husband is at bedside and expressed appreciation and she is doing much better. She did have one unwitness fall to the mat with no injury.   Using ativan prn for anxiety and crying which is effective. Also uses clonazepam and seroquel for delusion and anxiety. This helps but sometimes needs to be held due to lethargy  BP controlled   Parkinson's related dementia:  CT head  05/23/21 noted atrophy with chronic small vessel ischemic changes. Off exelon due to cost and lack of benefit She is able to communicate. Needs help with feeding and other ADls. Using a lift for transfer and wears briefs due to incontinence.   S/p thyroidectomy due to thyroid ca, on synthroid TSH 0.67 12/13/22  OP:T score -2.8 07/23/20, previous on reclast per Dr Sharl Ma in 2022.  Not currently on meds.   On miralax and senokot, BMs recorded regularly Past Medical History:  Diagnosis Date   Anxiety    Arthritis    knee & back    Blood type, Rh negative    Breast cancer (HCC)    Chronic fatigue    Depression    Diverticulosis    Endocervical polyp    GERD (gastroesophageal reflux disease)    Hypertension    Hypothyroidism    IBS (irritable bowel syndrome)    Insomnia    Lymphedema    Neuromuscular disorder (HCC)    Parkinson's disease    Osteopenia 06/2015   T score -2.4 distal third of the radius   Parkinson disease (HCC)    RBBB 10/07/2013   Thyroid cancer (HCC)    Past Surgical History:  Procedure Laterality Date   APPENDECTOMY  1979   BREAST SURGERY  1979   MASTECTOMY AND RECONSTRUCTION   DILATION AND CURETTAGE OF UTERUS     MISCARRIAGE   EYE SURGERY Bilateral    w/ IOL   HYSTEROSCOPY  2005,2009   AND D&C  MASTECTOMY Bilateral    THYROIDECTOMY     TONSILLECTOMY     TOTAL KNEE ARTHROPLASTY Right 10/07/2013   Procedure: RIGHT TOTAL KNEE ARTHROPLASTY;  Surgeon: Nestor Lewandowsky, MD;  Location: [facility] OR;  Service: Orthopedics;  Laterality: Right;   TUBAL LIGATION      Allergies  Allergen Reactions   Sulfa Antibiotics Rash   Biaxin [Clarithromycin] Other (See Comments)    Pt does not remember reaction    Ciprofloxacin Nausea And Vomiting   Hctz [Hydrochlorothiazide] Other (See Comments)    weakness and hyponatremia   Macrobid [Nitrofurantoin] Nausea Only   Other Nausea Only   Prednisone Other (See Comments)    Swelling and facial redness    Zofran [Ondansetron]     Sulfamethoxazole-Trimethoprim Other (See Comments) and Rash    Sore throat    Outpatient Encounter Medications as of 03/30/2023  Medication Sig   acetaminophen (TYLENOL) 500 MG tablet Take 1,000 mg by mouth every 8 (eight) hours as needed.   amLODipine (NORVASC) 5 MG tablet Take 5 mg by mouth daily.   carbidopa-levodopa (SINEMET IR) 25-100 MG tablet Take 1 tablet by mouth 4 (four) times daily. 0800, 1100, 1500, 2000   Carbidopa-Levodopa ER (SINEMET CR) 25-100 MG tablet controlled release Take 1 tablet by mouth at bedtime.   clonazePAM (KLONOPIN) 0.5 MG tablet Take 0.5 tablets (0.25 mg total) by mouth at bedtime.   CRANBERRY-VITAMIN C-D MANNOSE PO Take 1 tablet by mouth 3 (three) times daily.   ipratropium-albuterol (DUONEB) 0.5-2.5 (3) MG/3ML SOLN Take 3 mLs by nebulization every 6 (six) hours as needed.   irbesartan (AVAPRO) 150 MG tablet Take 150 mg by mouth daily.   levothyroxine (SYNTHROID) 150 MCG tablet Take 1 tablet (150 mcg total) by mouth daily before breakfast.   polyethylene glycol (MIRALAX / GLYCOLAX) 17 g packet Take 8.5 g by mouth daily.   QUEtiapine (SEROQUEL) 25 MG tablet Take 0.5 tablets (12.5 mg total) by mouth 2 (two) times daily.   senna-docusate (SENOKOT-S) 8.6-50 MG tablet Take 1 tablet by mouth at bedtime.   traMADol (ULTRAM) 50 MG tablet Take 1 tablet (50 mg total) by mouth every 6 (six) hours as needed.   LORazepam (ATIVAN) 0.5 MG tablet Take 0.25 mg by mouth daily as needed for anxiety. (Patient not taking: Reported on 03/30/2023)   Multiple Vitamin (MULTIVITAMIN) tablet Take 1 tablet by mouth daily. Cranberry manose vit c (Patient not taking: Reported on 03/30/2023)   sodium chloride (OCEAN) 0.65 % SOLN nasal spray Place 2 sprays into both nostrils every 2 (two) hours as needed for congestion.   No facility-administered encounter medications on file as of 03/30/2023.    Review of Systems  Unable to perform ROS: Dementia    Immunization History  Administered  Date(s) Administered   DTP 01/24/1994   Fluad Quad(high Dose 65+) 11/15/2022   Influenza Split 10/09/2007, 10/17/2008, 10/23/2009, 11/18/2011, 11/21/2012, 12/10/2019   Influenza Whole 10/09/2007   Influenza,inj,Quad PF,6+ Mos 10/26/2010   Influenza-Unspecified 11/05/2013, 10/31/2014, 10/12/2015, 10/20/2016, 11/15/2017, 11/13/2020, 10/29/2021   Moderna Covid-19 Fall Seasonal Vaccine 60yrs & older 11/29/2021   Moderna Covid-19 Vaccine Bivalent Booster 12yrs & up 05/19/2022, 11/15/2022   Moderna SARS-COV2 Booster Vaccination 11/28/2019, 05/28/2020, 11/06/2020   Moderna Sars-Covid-2 Vaccination 01/29/2019, 02/28/2019, 06/28/2021   PPD Test 06/17/2021   Pneumococcal Conjugate-13 10/23/2014   Pneumococcal Polysaccharide-23 10/25/2002   Rsv, Bivalent, Protein Subunit Rsvpref,pf Verdis Frederickson) 02/08/2022   Tdap 05/23/2021   Zoster, Live 10/13/2005   Zoster, Unspecified 10/13/2005   Pertinent  Health Maintenance Due  Topic Date Due   INFLUENZA VACCINE  Completed   DEXA SCAN  Completed      04/29/2021   12:25 PM 05/23/2021   12:01 PM 07/11/2022    4:41 PM 12/05/2022   11:17 AM 01/06/2023   10:24 AM  Fall Risk  Falls in the past year?   1 0 0  Was there an injury with Fall?   0 0 0  Fall Risk Category Calculator   2 0 0  (RETIRED) Patient Fall Risk Level Low fall risk Moderate fall risk     Patient at Risk for Falls Due to   History of fall(s);Impaired balance/gait;Impaired mobility No Fall Risks No Fall Risks  Fall risk Follow up   Falls evaluation completed Falls evaluation completed Falls evaluation completed   Functional Status Survey:    Vitals:   03/30/23 1612  BP: (!) 156/81  Pulse: 78  Resp: 18  Temp: (!) 97.3 F (36.3 C)  SpO2: 97%  Weight: 138 lb 3.2 oz (62.7 kg)  Height: 5\' 3"  (1.6 m)   Body mass index is 24.48 kg/m. Physical Exam Vitals and nursing note reviewed.  Constitutional:      General: She is not in acute distress.    Appearance: She is not diaphoretic.   HENT:     Head: Normocephalic and atraumatic.  Neck:     Vascular: No JVD.  Cardiovascular:     Rate and Rhythm: Normal rate and regular rhythm.     Heart sounds: No murmur heard. Pulmonary:     Effort: Pulmonary effort is normal. No respiratory distress.     Breath sounds: Normal breath sounds. No wheezing.  Abdominal:     General: Bowel sounds are normal. There is no distension.     Palpations: Abdomen is soft.     Tenderness: There is no abdominal tenderness.  Musculoskeletal:     Right lower leg: No edema.     Left lower leg: No edema.     Comments: Rigidity and decreased ROM to BUE and BLE  Skin:    General: Skin is warm and dry.  Neurological:     General: No focal deficit present.     Mental Status: She is alert. Mental status is at baseline.  Psychiatric:        Mood and Affect: Mood normal.     Labs reviewed: Recent Labs    12/13/22 0000  NA 137  K 4.1  CL 105  CO2 24*  BUN 28*  CREATININE 0.5  CALCIUM 8.6*   Recent Labs    12/13/22 0000  AST 15  ALT 10  ALKPHOS 103  ALBUMIN 3.4*   Recent Labs    12/13/22 0000  WBC 5.7  HGB 12.3  HCT 36  PLT 208   Lab Results  Component Value Date   TSH 0.67 12/13/2022   Lab Results  Component Value Date   HGBA1C 5.6 10/15/2013   No results found for: "CHOL", "HDL", "LDLCALC", "LDLDIRECT", "TRIG", "CHOLHDL"  Significant Diagnostic Results in last 30 days:  No results found.  Assessment/Plan  1. Essential hypertension (Primary) Controlled   2. Slow transit constipation Continue miralax and senokot   3. Hypothyroidism, postsurgical history of thyroid cancer Continue levothyroxine   4. Severe dementia due to Parkinson's disease, with psychotic disturbance (HCC) Followed by hospice Behaviors are controlled with current therapy   5. Parkinson's disease with dyskinesia and fluctuating manifestations (HCC) On sinemet Severe with progression over time  Goals of care are comfort based On  hospice  6. Age-related osteoporosis without current pathological fracture Noted with prior hx no longer on meds due to the above.   7. Degeneration of intervertebral disc of lumbosacral region with discogenic back pain Continue tylenol and ultram prn  8. Abnormality of gait Fall prec   Family/ staff Communication: discussed with her husband at bedside   Labs/tests ordered:  NA

## 2023-04-03 ENCOUNTER — Encounter: Payer: Self-pay | Admitting: Adult Health

## 2023-05-12 ENCOUNTER — Other Ambulatory Visit: Payer: Self-pay | Admitting: Internal Medicine

## 2023-05-12 DIAGNOSIS — F418 Other specified anxiety disorders: Secondary | ICD-10-CM

## 2023-05-15 NOTE — Telephone Encounter (Signed)
 Patient has request refill on medication Clonazepam . Patient medication last refilled 10/17/2022. Medication pend and sent to PCP Marguerite Shiley, MD

## 2023-05-22 ENCOUNTER — Encounter: Payer: Self-pay | Admitting: Adult Health

## 2023-05-22 ENCOUNTER — Non-Acute Institutional Stay (SKILLED_NURSING_FACILITY): Payer: Self-pay | Admitting: Adult Health

## 2023-05-22 DIAGNOSIS — F02C2 Dementia in other diseases classified elsewhere, severe, with psychotic disturbance: Secondary | ICD-10-CM

## 2023-05-22 DIAGNOSIS — G20A1 Parkinson's disease without dyskinesia, without mention of fluctuations: Secondary | ICD-10-CM | POA: Diagnosis not present

## 2023-05-22 DIAGNOSIS — F419 Anxiety disorder, unspecified: Secondary | ICD-10-CM | POA: Diagnosis not present

## 2023-05-22 DIAGNOSIS — R569 Unspecified convulsions: Secondary | ICD-10-CM | POA: Diagnosis not present

## 2023-05-22 DIAGNOSIS — F32A Depression, unspecified: Secondary | ICD-10-CM

## 2023-05-22 NOTE — Progress Notes (Unsigned)
 Location:  [facility] Room Number: 130 A Place of Service:  SNF (31)hospice Provider:  Raylene Calamity, NP    Patient Care Team: Marguerite Shiley, MD as PCP - General (Internal Medicine) Arty Binning, MD (Inactive) as Consulting Physician (Cardiology)  Extended Emergency Contact Information Primary Emergency Contact: Menor,Erwin Address: [address]          [place], [place] [address] [place] Home Phone: [phone] Work Phone: [phone] Mobile Phone: [phone] Relation: Spouse Secondary Emergency Contact: Anastacio,Theodore          [place]  , [place] Home Phone: [phone] Relation: Granddaughter  Code Status:  DNR Hospice Goals of care: Advanced Directive information    03/30/2023    4:26 PM  Advanced Directives  Does Patient Have a Medical Advance Directive? Yes  Type of Advance Directive Out of facility DNR (pink MOST or yellow form)  Does patient want to make changes to medical advance directive? No - Patient declined     Chief Complaint  Patient presents with   Acute Visit    HPI:    The patient is a 89 year old with Parkinson's and dementia who presents with recent episodes of nonverbal behavior, twitching, and weakness.  Over the weekend, she became very flat and nonverbal, was not eating or drinking, and exhibited twitching motions on one side of her body. She also appeared weaker on one side. Hospice was involved in her care during this time.  She was administered Ativan , which led to a subsidence of the symptoms. By the time of the current visit, she is close to her baseline, being verbal and intermittently able to follow commands, although she sometimes falls asleep and has nonsensical speech. She managed to eat fifty percent of her breakfast.  She takes Klonopin  each evening for anxiety.  She resides in skilled care. No respiratory distress.    Past Medical History:   Diagnosis Date   Anxiety    Arthritis    knee & back    Blood type, Rh negative    Breast cancer (HCC)    Chronic fatigue    Depression    Diverticulosis    Endocervical polyp    GERD (gastroesophageal reflux disease)    Hypertension    Hypothyroidism    IBS (irritable bowel syndrome)    Insomnia    Lymphedema    Neuromuscular disorder (HCC)    Parkinson's disease    Osteopenia 06/2015   T score -2.4 distal third of the radius   Parkinson disease (HCC)    RBBB 10/07/2013   Thyroid  cancer (HCC)    Past Surgical History:  Procedure Laterality Date   APPENDECTOMY  1979   BREAST SURGERY  1979   MASTECTOMY AND RECONSTRUCTION   DILATION AND CURETTAGE OF UTERUS     MISCARRIAGE   EYE SURGERY Bilateral    w/ IOL   HYSTEROSCOPY  2005,2009   AND D&C   MASTECTOMY Bilateral    THYROIDECTOMY     TONSILLECTOMY     TOTAL KNEE ARTHROPLASTY Right 10/07/2013   Procedure: RIGHT TOTAL KNEE ARTHROPLASTY;  Surgeon: Ilean Mall, MD;  Location: [facility] OR;  Service: Orthopedics;  Laterality: Right;   TUBAL LIGATION      Allergies  Allergen Reactions   Sulfa Antibiotics Rash   Biaxin [Clarithromycin] Other (See Comments)    Pt does not remember reaction    Ciprofloxacin Nausea And Vomiting   Hctz [Hydrochlorothiazide] Other (See  Comments)    weakness and hyponatremia   Macrobid  [Nitrofurantoin ] Nausea Only   Other Nausea Only   Prednisone Other (See Comments)    Swelling and facial redness    Zofran  [Ondansetron ]    Sulfamethoxazole-Trimethoprim Other (See Comments) and Rash    Sore throat    Outpatient Encounter Medications as of 05/22/2023  Medication Sig   acetaminophen  (TYLENOL ) 500 MG tablet Take 1,000 mg by mouth every 8 (eight) hours as needed.   amLODipine  (NORVASC ) 5 MG tablet Take 5 mg by mouth daily.   carbidopa -levodopa  (SINEMET  IR) 25-100 MG tablet Take 1 tablet by mouth 4 (four) times daily. 0800, 1100, 1500, 2000   Carbidopa -Levodopa  ER (SINEMET  CR) 25-100 MG  tablet controlled release Take 1 tablet by mouth at bedtime.   clonazePAM  (KLONOPIN ) 0.5 MG tablet Take 0.5 tablets (0.25 mg total) by mouth at bedtime.   CRANBERRY-VITAMIN C -D MANNOSE PO Take 1 tablet by mouth 3 (three) times daily.   irbesartan  (AVAPRO ) 150 MG tablet Take 150 mg by mouth daily.   levothyroxine  (SYNTHROID ) 150 MCG tablet Take 1 tablet (150 mcg total) by mouth daily before breakfast.   LORazepam  (ATIVAN ) 0.5 MG tablet Take 0.25 mg by mouth daily as needed for anxiety.   Multiple Vitamin (MULTIVITAMIN) tablet Take 1 tablet by mouth 3 (three) times daily. Cranberry vit c D mannose   polyethylene glycol (MIRALAX  / GLYCOLAX ) 17 g packet Take 8.5 g by mouth daily.   QUEtiapine  (SEROQUEL ) 25 MG tablet Take 0.5 tablets (12.5 mg total) by mouth 2 (two) times daily.   senna-docusate (SENOKOT-S) 8.6-50 MG tablet Take 1 tablet by mouth at bedtime.   traMADol  (ULTRAM ) 50 MG tablet Take 1 tablet (50 mg total) by mouth every 6 (six) hours as needed.   ipratropium-albuterol  (DUONEB) 0.5-2.5 (3) MG/3ML SOLN Take 3 mLs by nebulization every 6 (six) hours as needed.   sodium chloride  (OCEAN) 0.65 % SOLN nasal spray Place 2 sprays into both nostrils every 2 (two) hours as needed for congestion.   No facility-administered encounter medications on file as of 05/22/2023.    Review of Systems  Immunization History  Administered Date(s) Administered   DTP 01/24/1994   Fluad Quad(high Dose 65+) 11/15/2022   Fluzone Influenza virus vaccine,trivalent (IIV3), split virus 10/23/2009, 11/18/2011, 11/21/2012, 12/10/2019   Influenza Split 10/09/2007, 10/17/2008   Influenza Whole 10/09/2007   Influenza,inj,Quad PF,6+ Mos 10/26/2010   Influenza-Unspecified 11/05/2013, 10/31/2014, 10/12/2015, 10/20/2016, 11/15/2017, 11/13/2020, 10/29/2021   Moderna Covid-19 Fall Seasonal Vaccine 72yrs & older 11/29/2021   Moderna Covid-19 Vaccine Bivalent Booster 39yrs & up 05/19/2022, 11/15/2022   Moderna SARS-COV2  Booster Vaccination 11/28/2019, 05/28/2020, 11/06/2020   Moderna Sars-Covid-2 Vaccination 01/29/2019, 02/28/2019, 06/28/2021   PPD Test 06/17/2021   Pneumococcal Conjugate-13 10/23/2014   Pneumococcal Polysaccharide-23 10/25/2002   Rsv, Bivalent, Protein Subunit Rsvpref,pf Pattricia Bores) 02/08/2022   Tdap 05/23/2021   Zoster, Live 10/13/2005   Zoster, Unspecified 10/13/2005   Pertinent  Health Maintenance Due  Topic Date Due   INFLUENZA VACCINE  08/25/2023   DEXA SCAN  Completed      04/29/2021   12:25 PM 05/23/2021   12:01 PM 07/11/2022    4:41 PM 12/05/2022   11:17 AM 01/06/2023   10:24 AM  Fall Risk  Falls in the past year?   1 0 0  Was there an injury with Fall?   0 0 0  Fall Risk Category Calculator   2 0 0  (RETIRED) Patient Fall Risk Level Low fall risk Moderate  fall risk     Patient at Risk for Falls Due to   History of fall(s);Impaired balance/gait;Impaired mobility No Fall Risks No Fall Risks  Fall risk Follow up   Falls evaluation completed Falls evaluation completed Falls evaluation completed   Functional Status Survey:    Vitals:   05/22/23 0938  BP: 120/62  Pulse: 69  Resp: 18  Temp: (!) 96.8 F (36 C)  SpO2: 96%  Weight: 142 lb 3.2 oz (64.5 kg)  Height: 5\' 3"  (1.6 m)   Body mass index is 25.19 kg/m. Physical Exam  Labs reviewed: Recent Labs    12/13/22 0000  NA 137  K 4.1  CL 105  CO2 24*  BUN 28*  CREATININE 0.5  CALCIUM 8.6*   Recent Labs    12/13/22 0000  AST 15  ALT 10  ALKPHOS 103  ALBUMIN 3.4*   Recent Labs    12/13/22 0000  WBC 5.7  HGB 12.3  HCT 36  PLT 208   Lab Results  Component Value Date   TSH 0.67 12/13/2022   Lab Results  Component Value Date   HGBA1C 5.6 10/15/2013   No results found for: "CHOL", "HDL", "LDLCALC", "LDLDIRECT", "TRIG", "CHOLHDL"  Significant Diagnostic Results in last 30 days:  No results found.  Assessment/Plan  TIA vs seizure Intermittent command following and nonsensical speech. Recent  nonverbal episode possibly due to TIA or seizure. Symptoms improved with Ativan . - Use Ativan  as needed for comfort.  Parkinson's disease Recent unilateral twitching and weakness, possibly related to TIA or seizure. Symptoms improved with Ativan . - Use Ativan  as needed for comfort.  Anxiety Managed with Klonopin . - Continue Klonopin  each evening.   Goals of care are comfort based Hospice to f/u

## 2023-05-23 ENCOUNTER — Encounter: Payer: Self-pay | Admitting: Adult Health

## 2023-05-31 ENCOUNTER — Encounter: Payer: Self-pay | Admitting: Internal Medicine

## 2023-05-31 ENCOUNTER — Non-Acute Institutional Stay (SKILLED_NURSING_FACILITY): Payer: Self-pay | Admitting: Internal Medicine

## 2023-05-31 DIAGNOSIS — G20A1 Parkinson's disease without dyskinesia, without mention of fluctuations: Secondary | ICD-10-CM

## 2023-05-31 DIAGNOSIS — F32A Depression, unspecified: Secondary | ICD-10-CM

## 2023-05-31 DIAGNOSIS — I10 Essential (primary) hypertension: Secondary | ICD-10-CM

## 2023-05-31 DIAGNOSIS — F02C2 Dementia in other diseases classified elsewhere, severe, with psychotic disturbance: Secondary | ICD-10-CM

## 2023-05-31 DIAGNOSIS — F419 Anxiety disorder, unspecified: Secondary | ICD-10-CM

## 2023-05-31 DIAGNOSIS — R569 Unspecified convulsions: Secondary | ICD-10-CM | POA: Diagnosis not present

## 2023-05-31 DIAGNOSIS — G20B2 Parkinson's disease with dyskinesia, with fluctuations: Secondary | ICD-10-CM

## 2023-05-31 DIAGNOSIS — E890 Postprocedural hypothyroidism: Secondary | ICD-10-CM

## 2023-05-31 DIAGNOSIS — K5901 Slow transit constipation: Secondary | ICD-10-CM

## 2023-05-31 NOTE — Progress Notes (Signed)
 Location:  [facility] Room Number: 130/A Place of Service:  SNF [phone]) Provider:  Marguerite Shiley, MD  Patient Care Team: Marguerite Shiley, MD as PCP - General (Internal Medicine) Arty Binning, MD (Inactive) as Consulting Physician (Cardiology)  Extended Emergency Contact Information Primary Emergency Contact: Kerner,Erwin Address: [address]          [place], [place] [address] [place] Home Phone: [phone] Work Phone: [phone] Mobile Phone: [phone] Relation: Spouse Secondary Emergency Contact: Delis,Theodore          [place]  , [place] Home Phone: [phone] Relation: Granddaughter  Code Status:  DNR/Hospice Goals of care: Advanced Directive information    05/31/2023   11:09 AM  Advanced Directives  Does Patient Have a Medical Advance Directive? Yes  Type of Advance Directive Out of facility DNR (pink MOST or yellow form)  Does patient want to make changes to medical advance directive? No - Patient declined     Chief Complaint  Patient presents with   Medical Management of Chronic Issues    Routine Visit    HPI:  Pt is a 89 y.o. female seen today for medical management of chronic diseases.   Lives in SNF in [place]   Enrolled in Hospice She has h/o  Parkinson.   h/o cognitive impairment  Hypertension Hypothyroidism Depression Urinary In continence Osteoporosis h/o Dyskinetic movement and slurred speech   Recently patient had Episode which seemed like Seizure /? Myoclonic Jerks Hospice started her on Ativan  and Klonopin  was discontinued She got sedated and husband was upset about it . Now Ativan  is PRN again She Is now back to her baseline Her speech still is Dysarthric Also have times when she does not respond    Past Medical History:  Diagnosis Date   Anxiety    Arthritis    knee & back    Blood type, Rh negative    Breast cancer (HCC)    Chronic fatigue     Depression    Diverticulosis    Endocervical polyp    GERD (gastroesophageal reflux disease)    Hypertension    Hypothyroidism    IBS (irritable bowel syndrome)    Insomnia    Lymphedema    Neuromuscular disorder (HCC)    Parkinson's disease    Osteopenia 06/2015   T score -2.4 distal third of the radius   Parkinson disease (HCC)    RBBB 10/07/2013   Thyroid  cancer (HCC)    Past Surgical History:  Procedure Laterality Date   APPENDECTOMY  1979   BREAST SURGERY  1979   MASTECTOMY AND RECONSTRUCTION   DILATION AND CURETTAGE OF UTERUS     MISCARRIAGE   EYE SURGERY Bilateral    w/ IOL   HYSTEROSCOPY  2005,2009   AND D&C   MASTECTOMY Bilateral    THYROIDECTOMY     TONSILLECTOMY     TOTAL KNEE ARTHROPLASTY Right 10/07/2013   Procedure: RIGHT TOTAL KNEE ARTHROPLASTY;  Surgeon: Ilean Mall, MD;  Location: [facility] OR;  Service: Orthopedics;  Laterality: Right;   TUBAL LIGATION      Allergies  Allergen Reactions   Sulfa Antibiotics Rash   Biaxin [Clarithromycin] Other (See Comments)    Pt does not remember reaction    Ciprofloxacin Nausea And Vomiting   Hctz [Hydrochlorothiazide] Other (See Comments)    weakness and hyponatremia   Macrobid  [Nitrofurantoin ] Nausea Only   Other Nausea Only   Prednisone Other (See  Comments)    Swelling and facial redness    Zofran  [Ondansetron ]    Sulfamethoxazole-Trimethoprim Other (See Comments) and Rash    Sore throat    Outpatient Encounter Medications as of 05/31/2023  Medication Sig   acetaminophen  (TYLENOL ) 500 MG tablet Take 1,000 mg by mouth every 8 (eight) hours as needed.   amLODipine  (NORVASC ) 5 MG tablet Take 5 mg by mouth daily.   carbidopa -levodopa  (SINEMET  IR) 25-100 MG tablet Take 1 tablet by mouth 4 (four) times daily. 0800, 1100, 1500, 2000   Carbidopa -Levodopa  ER (SINEMET  CR) 25-100 MG tablet controlled release Take 1 tablet by mouth at bedtime.   CRANBERRY-VITAMIN C -D MANNOSE PO Take 1 tablet by mouth 3 (three) times  daily.   irbesartan  (AVAPRO ) 150 MG tablet Take 150 mg by mouth daily.   levothyroxine  (SYNTHROID ) 150 MCG tablet Take 1 tablet (150 mcg total) by mouth daily before breakfast.   LORazepam  (ATIVAN ) 0.5 MG tablet Take 0.25 mg by mouth daily as needed for anxiety.   polyethylene glycol (MIRALAX  / GLYCOLAX ) 17 g packet Take 8.5 g by mouth daily.   QUEtiapine  (SEROQUEL ) 25 MG tablet Take 0.5 tablets (12.5 mg total) by mouth 2 (two) times daily.   senna-docusate (SENOKOT-S) 8.6-50 MG tablet Take 1 tablet by mouth at bedtime.   traMADol  (ULTRAM ) 50 MG tablet Take 1 tablet (50 mg total) by mouth every 6 (six) hours as needed.   clonazePAM  (KLONOPIN ) 0.5 MG tablet TAKE 1/2 TABLET (0.25MG ) BY MOUTH AT BEDTIME FOR FOR ANXIETY (CONTROL) (Patient not taking: Reported on 05/31/2023)   ipratropium-albuterol  (DUONEB) 0.5-2.5 (3) MG/3ML SOLN Take 3 mLs by nebulization every 6 (six) hours as needed.   Multiple Vitamin (MULTIVITAMIN) tablet Take 1 tablet by mouth 3 (three) times daily. Cranberry vit c D mannose (Patient not taking: Reported on 05/31/2023)   sodium chloride  (OCEAN) 0.65 % SOLN nasal spray Place 2 sprays into both nostrils every 2 (two) hours as needed for congestion.   No facility-administered encounter medications on file as of 05/31/2023.    Review of Systems  Unable to perform ROS: Dementia    Immunization History  Administered Date(s) Administered   DTP 01/24/1994   Fluad Quad(high Dose 65+) 11/15/2022   Fluzone Influenza virus vaccine,trivalent (IIV3), split virus 10/23/2009, 10/26/2010, 11/18/2011, 11/21/2012, 12/10/2019   Influenza Split 10/09/2007, 10/17/2008   Influenza Whole 10/09/2007   Influenza, High Dose Seasonal PF 11/05/2013, 10/31/2014, 10/12/2015, 10/20/2016, 11/15/2017   Influenza,inj,Quad PF,6+ Mos 10/26/2010   Influenza-Unspecified 11/05/2013, 10/31/2014, 10/12/2015, 10/20/2016, 11/15/2017, 11/13/2020, 10/29/2021   Moderna Covid-19 Fall Seasonal Vaccine 46yrs & older  11/29/2021   Moderna Covid-19 Vaccine Bivalent Booster 29yrs & up 05/19/2022, 11/15/2022   Moderna SARS-COV2 Booster Vaccination 11/28/2019, 05/28/2020, 11/06/2020   Moderna Sars-Covid-2 Vaccination 01/29/2019, 02/28/2019, 06/28/2021   PPD Test 06/17/2021   Pneumococcal Conjugate-13 10/23/2014   Pneumococcal Polysaccharide-23 10/25/2002   Rsv, Bivalent, Protein Subunit Rsvpref,pf Pattricia Bores) 02/08/2022   Tdap 05/23/2021   Zoster, Live 10/13/2005   Zoster, Unspecified 10/13/2005   Pertinent  Health Maintenance Due  Topic Date Due   INFLUENZA VACCINE  08/25/2023   DEXA SCAN  Completed      04/29/2021   12:25 PM 05/23/2021   12:01 PM 07/11/2022    4:41 PM 12/05/2022   11:17 AM 01/06/2023   10:24 AM  Fall Risk  Falls in the past year?   1 0 0  Was there an injury with Fall?   0 0 0  Fall Risk Category Calculator   2  0 0  (RETIRED) Patient Fall Risk Level Low fall risk Moderate fall risk     Patient at Risk for Falls Due to   History of fall(s);Impaired balance/gait;Impaired mobility No Fall Risks No Fall Risks  Fall risk Follow up   Falls evaluation completed Falls evaluation completed Falls evaluation completed   Functional Status Survey:    Vitals:   05/31/23 1100  BP: (!) 147/81  Pulse: 71  Resp: 18  Temp: (!) 97.2 F (36.2 C)  SpO2: 96%  Weight: 141 lb 3.2 oz (64 kg)  Height: 5\' 3"  (1.6 m)   Body mass index is 25.01 kg/m. Physical Exam Vitals reviewed.  Constitutional:      Appearance: Normal appearance.  HENT:     Head: Normocephalic.     Nose: Nose normal.     Mouth/Throat:     Mouth: Mucous membranes are moist.     Pharynx: Oropharynx is clear.  Eyes:     Pupils: Pupils are equal, round, and reactive to light.  Cardiovascular:     Rate and Rhythm: Normal rate and regular rhythm.     Pulses: Normal pulses.     Heart sounds: Normal heart sounds. No murmur heard. Pulmonary:     Effort: Pulmonary effort is normal.     Breath sounds: Normal breath sounds.   Abdominal:     General: Abdomen is flat. Bowel sounds are normal.     Palpations: Abdomen is soft.  Musculoskeletal:        General: No swelling.     Cervical back: Neck supple.  Skin:    General: Skin is warm.  Neurological:     General: No focal deficit present.     Mental Status: She is alert.  Psychiatric:        Mood and Affect: Mood normal.        Thought Content: Thought content normal.     Labs reviewed: Recent Labs    12/13/22 0000  NA 137  K 4.1  CL 105  CO2 24*  BUN 28*  CREATININE 0.5  CALCIUM 8.6*   Recent Labs    12/13/22 0000  AST 15  ALT 10  ALKPHOS 103  ALBUMIN 3.4*   Recent Labs    12/13/22 0000  WBC 5.7  HGB 12.3  HCT 36  PLT 208   Lab Results  Component Value Date   TSH 0.67 12/13/2022   Lab Results  Component Value Date   HGBA1C 5.6 10/15/2013   No results found for: "CHOL", "HDL", "LDLCALC", "LDLDIRECT", "TRIG", "CHOLHDL"  Significant Diagnostic Results in last 30 days:  No results found.  Assessment/Plan 1. Essential hypertension (Primary) Norvasc  and Avapro   2. Parkinson's disease with dyskinesia and fluctuating manifestations (HCC) On Sinemet  'Now end stage with Total care for ADLS  3. Seizure (HCC)/ Myoclonic Jerks No More work up  D/w Husband about using Ativan  PRN and he is ok with that Also d/w Hospice Nurse  4. 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 Anxiety and depression Ativan  PRN  6. Slow transit constipation Senna and Miralax   7. Hypothyroidism, postsurgical history of thyroid  cancer TSH was normal in 11/24    Family/ staff Communication:   Labs/tests ordered:

## 2023-07-06 ENCOUNTER — Non-Acute Institutional Stay (SKILLED_NURSING_FACILITY): Payer: Self-pay | Admitting: Adult Health

## 2023-07-06 DIAGNOSIS — F02C2 Dementia in other diseases classified elsewhere, severe, with psychotic disturbance: Secondary | ICD-10-CM

## 2023-07-06 DIAGNOSIS — G20A1 Parkinson's disease without dyskinesia, without mention of fluctuations: Secondary | ICD-10-CM

## 2023-07-06 DIAGNOSIS — I10 Essential (primary) hypertension: Secondary | ICD-10-CM

## 2023-07-06 DIAGNOSIS — G20B2 Parkinson's disease with dyskinesia, with fluctuations: Secondary | ICD-10-CM | POA: Diagnosis not present

## 2023-07-06 DIAGNOSIS — N390 Urinary tract infection, site not specified: Secondary | ICD-10-CM

## 2023-07-06 DIAGNOSIS — M810 Age-related osteoporosis without current pathological fracture: Secondary | ICD-10-CM | POA: Diagnosis not present

## 2023-07-06 DIAGNOSIS — R251 Tremor, unspecified: Secondary | ICD-10-CM

## 2023-07-06 DIAGNOSIS — E890 Postprocedural hypothyroidism: Secondary | ICD-10-CM

## 2023-07-07 ENCOUNTER — Encounter: Payer: Self-pay | Admitting: Adult Health

## 2023-07-07 DIAGNOSIS — R251 Tremor, unspecified: Secondary | ICD-10-CM | POA: Insufficient documentation

## 2023-07-07 NOTE — Assessment & Plan Note (Signed)
 No longer on medication Non weight bearing at this point.

## 2023-07-07 NOTE — Assessment & Plan Note (Signed)
 No current symptoms On cranberrry supplement

## 2023-07-07 NOTE — Assessment & Plan Note (Signed)
 TSH acceptable on levothyroxine 

## 2023-07-07 NOTE — Progress Notes (Signed)
 Location:  [facility] illustrator of Service:  SNF (31)hospice Provider:  Raylene Calamity, NP    Patient Care Team: Marguerite Shiley, MD as PCP - General (Internal Medicine) Arty Binning, MD (Inactive) as Consulting Physician (Cardiology)  Extended Emergency Contact Information Primary Emergency Contact: [facility] Address: [address]          [place], [place] [address] [place] Home Phone: [phone] Work Phone: [phone] Mobile Phone: [phone] Relation: Spouse Secondary Emergency Contact: Eggenberger,Theodore          [place]  , [place] Home Phone: [phone] Relation: Granddaughter  Code Status:  DNR Hospice Goals of care: Advanced Directive information    05/31/2023   11:09 AM  Advanced Directives  Does Patient Have a Medical Advance Directive? Yes  Type of Advance Directive Out of facility DNR (pink MOST or yellow form)  Does patient want to make changes to medical advance directive? No - Patient declined     Chief Complaint  Patient presents with   Medical Management of Chronic Issues    HPI:   Pt is a 89 y.o. female seen today for medical management of chronic diseases.     Enrolled in hospice due to PD, dementia, and functional decline.   Continues with PD symptoms tremor,slow movement, rigidity, and dysphonia. Having worsening rigidity in BLE  Weight down 4 lbs in the past two months. Eating 50-75% of meals  Ativan  was ordered to help with possible seizure activity vs tremors due to PD. She experienced lethargy and her husband was concerned so it was changed to as needed.   Using ativan  prn for anxiety and crying which is effective. Also uses  seroquel  for delusion and anxiety.  BP controlled   Parkinson's related dementia:  CT head 05/23/21 noted atrophy with chronic small vessel ischemic changes. Off exelon  due to cost and lack of benefit She is able to communicate. Needs help with  feeding and other ADls. Using a lift for transfer and wears briefs due to incontinence.  Periods of lethargy and periods of alertness are noted. Very alert today for my visit and communicating.   S/p thyroidectomy due to thyroid  ca, on synthroid  TSH 0.67 12/13/22  OP:T score -2.8 07/23/20, previous on reclast  per Dr Kathyanne Parkers in 2022.  Not currently on meds.   On miralax  and senokot, BMs recorded regularly     Past Medical History:  Diagnosis Date   Anxiety    Arthritis    knee & back    Blood type, Rh negative    Breast cancer (HCC)    Chronic fatigue    Depression    Diverticulosis    Endocervical polyp    GERD (gastroesophageal reflux disease)    Hypertension    Hypothyroidism    IBS (irritable bowel syndrome)    Insomnia    Lymphedema    Neuromuscular disorder (HCC)    Parkinson's disease    Osteopenia 06/2015   T score -2.4 distal third of the radius   Parkinson disease (HCC)    RBBB 10/07/2013   Thyroid  cancer (HCC)    Past Surgical History:  Procedure Laterality Date   APPENDECTOMY  1979   BREAST SURGERY  1979   MASTECTOMY AND RECONSTRUCTION   DILATION AND CURETTAGE OF UTERUS     MISCARRIAGE   EYE SURGERY Bilateral    w/ IOL   HYSTEROSCOPY  2005,2009   AND D&C   MASTECTOMY Bilateral  THYROIDECTOMY     TONSILLECTOMY     TOTAL KNEE ARTHROPLASTY Right 10/07/2013   Procedure: RIGHT TOTAL KNEE ARTHROPLASTY;  Surgeon: Ilean Mall, MD;  Location: [facility] OR;  Service: Orthopedics;  Laterality: Right;   TUBAL LIGATION      Allergies  Allergen Reactions   Sulfa Antibiotics Rash   Biaxin [Clarithromycin] Other (See Comments)    Pt does not remember reaction    Ciprofloxacin Nausea And Vomiting   Hctz [Hydrochlorothiazide] Other (See Comments)    weakness and hyponatremia   Macrobid  [Nitrofurantoin ] Nausea Only   Other Nausea Only   Prednisone Other (See Comments)    Swelling and facial redness    Zofran  [Ondansetron ]    Sulfamethoxazole-Trimethoprim Other  (See Comments) and Rash    Sore throat    Outpatient Encounter Medications as of 07/06/2023  Medication Sig   acetaminophen  (TYLENOL ) 500 MG tablet Take 1,000 mg by mouth every 8 (eight) hours as needed.   amLODipine  (NORVASC ) 5 MG tablet Take 5 mg by mouth daily.   carbidopa -levodopa  (SINEMET  IR) 25-100 MG tablet Take 1 tablet by mouth 4 (four) times daily. 0800, 1100, 1500, 2000   Carbidopa -Levodopa  ER (SINEMET  CR) 25-100 MG tablet controlled release Take 1 tablet by mouth at bedtime.   CRANBERRY-VITAMIN C -D MANNOSE PO Take 1 tablet by mouth 3 (three) times daily.   irbesartan  (AVAPRO ) 150 MG tablet Take 150 mg by mouth daily.   levothyroxine  (SYNTHROID ) 150 MCG tablet Take 1 tablet (150 mcg total) by mouth daily before breakfast.   LORazepam  (ATIVAN ) 0.5 MG tablet Take 0.25 mg by mouth daily as needed for anxiety.   polyethylene glycol (MIRALAX  / GLYCOLAX ) 17 g packet Take 8.5 g by mouth daily.   QUEtiapine  (SEROQUEL ) 25 MG tablet Take 0.5 tablets (12.5 mg total) by mouth 2 (two) times daily.   senna-docusate (SENOKOT-S) 8.6-50 MG tablet Take 1 tablet by mouth at bedtime.   traMADol  (ULTRAM ) 50 MG tablet Take 1 tablet (50 mg total) by mouth every 6 (six) hours as needed.   No facility-administered encounter medications on file as of 07/06/2023.    Review of Systems  Unable to perform ROS: Dementia    Immunization History  Administered Date(s) Administered   DTP 01/24/1994   Fluad Quad(high Dose 65+) 11/15/2022   Fluzone Influenza virus vaccine,trivalent (IIV3), split virus 10/23/2009, 10/26/2010, 11/18/2011, 11/21/2012, 12/10/2019   Influenza Split 10/09/2007, 10/17/2008   Influenza Whole 10/09/2007   Influenza, High Dose Seasonal PF 11/05/2013, 10/31/2014, 10/12/2015, 10/20/2016, 11/15/2017   Influenza,inj,Quad PF,6+ Mos 10/26/2010   Influenza-Unspecified 11/05/2013, 10/31/2014, 10/12/2015, 10/20/2016, 11/15/2017, 11/13/2020, 10/29/2021   Moderna Covid-19 Fall Seasonal Vaccine  41yrs & older 11/29/2021   Moderna Covid-19 Vaccine Bivalent Booster 90yrs & up 05/19/2022, 11/15/2022   Moderna SARS-COV2 Booster Vaccination 11/28/2019, 05/28/2020, 11/06/2020   Moderna Sars-Covid-2 Vaccination 01/29/2019, 02/28/2019, 06/28/2021   PPD Test 06/17/2021   Pneumococcal Conjugate-13 10/23/2014   Pneumococcal Polysaccharide-23 10/25/2002   Rsv, Bivalent, Protein Subunit Rsvpref,pf Pattricia Bores) 02/08/2022   Tdap 05/23/2021   Zoster, Live 10/13/2005   Zoster, Unspecified 10/13/2005   Pertinent  Health Maintenance Due  Topic Date Due   INFLUENZA VACCINE  08/25/2023   DEXA SCAN  Completed      04/29/2021   12:25 PM 05/23/2021   12:01 PM 07/11/2022    4:41 PM 12/05/2022   11:17 AM 01/06/2023   10:24 AM  Fall Risk  Falls in the past year?   1 0 0  Was there an injury with Fall?  0 0 0  Fall Risk Category Calculator   2 0 0  (RETIRED) Patient Fall Risk Level Low fall risk  Moderate fall risk      Patient at Risk for Falls Due to   History of fall(s);Impaired balance/gait;Impaired mobility No Fall Risks No Fall Risks  Fall risk Follow up   Falls evaluation completed Falls evaluation completed Falls evaluation completed     Data saved with a previous flowsheet row definition   Functional Status Survey:    Vitals:   07/07/23 0731  Weight: 138 lb 6.4 oz (62.8 kg)   Body mass index is 24.52 kg/m. Physical Exam Constitutional:      General: She is not in acute distress.    Appearance: She is not diaphoretic.  HENT:     Head: Normocephalic and atraumatic.     Mouth/Throat:     Mouth: Mucous membranes are moist.     Pharynx: Oropharynx is clear.   Eyes:     Conjunctiva/sclera: Conjunctivae normal.     Pupils: Pupils are equal, round, and reactive to light.   Neck:     Vascular: No JVD.   Cardiovascular:     Rate and Rhythm: Normal rate and regular rhythm.     Heart sounds: No murmur heard. Pulmonary:     Effort: Pulmonary effort is normal. No respiratory  distress.     Breath sounds: Normal breath sounds. No wheezing.  Abdominal:     General: Abdomen is flat.     Palpations: Abdomen is soft.   Skin:    General: Skin is warm and dry.   Neurological:     General: No focal deficit present.     Mental Status: She is alert. Mental status is at baseline.   Psychiatric:        Mood and Affect: Mood normal.     Labs reviewed: Recent Labs    12/13/22 0000  NA 137  K 4.1  CL 105  CO2 24*  BUN 28*  CREATININE 0.5  CALCIUM 8.6*   Recent Labs    12/13/22 0000  AST 15  ALT 10  ALKPHOS 103  ALBUMIN 3.4*   Recent Labs    12/13/22 0000  WBC 5.7  HGB 12.3  HCT 36  PLT 208   Lab Results  Component Value Date   TSH 0.67 12/13/2022   Lab Results  Component Value Date   HGBA1C 5.6 10/15/2013   No results found for: CHOL, HDL, LDLCALC, LDLDIRECT, TRIG, CHOLHDL  Significant Diagnostic Results in last 30 days:  No results found.  Assessment/Plan  Essential hypertension BP is controlled with norvasc  and ARB therapy Labs annually due to hospice status.   Hypothyroidism, postsurgical history of thyroid  cancer TSH acceptable on levothyroxine   Osteoporosis No longer on medication Non weight bearing at this point.   Parkinson's disease (tremor, stiffness, slow motion, unstable posture) (HCC) Severe stage Continues on sinemet  Followed by hospice  Severe dementia due to Parkinson's disease, with psychotic disturbance (HCC) Progressive decline in cognition and physical function c/w the disease. Continue supportive care in the skilled environment. Continue seroquel  for delusion thinking and anxiety Continue prn ativan   Recurrent UTI No current symptoms On cranberrry supplement  Tremor Tremor vs seizure activity Ativan  prn prescribed No new issues.

## 2023-07-07 NOTE — Assessment & Plan Note (Signed)
 Progressive decline in cognition and physical function c/w the disease. Continue supportive care in the skilled environment. Continue seroquel  for delusion thinking and anxiety Continue prn ativan 

## 2023-07-07 NOTE — Assessment & Plan Note (Signed)
 BP is controlled with norvasc  and ARB therapy Labs annually due to hospice status.

## 2023-07-07 NOTE — Assessment & Plan Note (Signed)
 Severe stage Continues on sinemet  Followed by hospice

## 2023-07-07 NOTE — Assessment & Plan Note (Signed)
 Tremor vs seizure activity Ativan  prn prescribed No new issues.

## 2023-08-04 ENCOUNTER — Non-Acute Institutional Stay (SKILLED_NURSING_FACILITY): Payer: Self-pay | Admitting: Adult Health

## 2023-08-04 ENCOUNTER — Encounter: Payer: Self-pay | Admitting: Adult Health

## 2023-08-04 DIAGNOSIS — F02C2 Dementia in other diseases classified elsewhere, severe, with psychotic disturbance: Secondary | ICD-10-CM

## 2023-08-04 DIAGNOSIS — E890 Postprocedural hypothyroidism: Secondary | ICD-10-CM

## 2023-08-04 DIAGNOSIS — G20A1 Parkinson's disease without dyskinesia, without mention of fluctuations: Secondary | ICD-10-CM | POA: Diagnosis not present

## 2023-08-04 DIAGNOSIS — F3341 Major depressive disorder, recurrent, in partial remission: Secondary | ICD-10-CM

## 2023-08-04 DIAGNOSIS — G20B2 Parkinson's disease with dyskinesia, with fluctuations: Secondary | ICD-10-CM

## 2023-08-04 DIAGNOSIS — I10 Essential (primary) hypertension: Secondary | ICD-10-CM

## 2023-08-04 DIAGNOSIS — R569 Unspecified convulsions: Secondary | ICD-10-CM

## 2023-08-04 DIAGNOSIS — K5901 Slow transit constipation: Secondary | ICD-10-CM | POA: Diagnosis not present

## 2023-08-04 NOTE — Progress Notes (Unsigned)
 Location:  [facility] Room Number: 130-P Place of Service:  SNF (31) Provider:  Darlean Maus, NP   Patient Care Team: Charlanne Fredia CROME, MD as PCP - General (Internal Medicine) Claudene Victory ORN, MD (Inactive) as Consulting Physician (Cardiology)  Extended Emergency Contact Information Primary Emergency Contact: Swartzentruber,Erwin Address: [address]          [place], [place] [address] [place] Home Phone: [phone] Work Phone: [phone] Mobile Phone: [phone] Relation: Spouse Secondary Emergency Contact: Blecher,Theodore          [place]  , [place] Home Phone: [phone] Relation: Granddaughter  Code Status:  DNR Goals of care: Advanced Directive information    05/31/2023   11:09 AM  Advanced Directives  Does Patient Have a Medical Advance Directive? Yes  Type of Advance Directive Out of facility DNR (pink MOST or yellow form)  Does patient want to make changes to medical advance directive? No - Patient declined     Chief Complaint  Patient presents with   Medical Management of Chronic Issues    Routine visit.    HPI:  Pt is a 89 y.o. female seen today for medical management of chronic diseases.     Past Medical History:  Diagnosis Date   Anxiety    Arthritis    knee & back    Blood type, Rh negative    Breast cancer (HCC)    Chronic fatigue    Depression    Diverticulosis    Endocervical polyp    GERD (gastroesophageal reflux disease)    Hypertension    Hypothyroidism    IBS (irritable bowel syndrome)    Insomnia    Lymphedema    Neuromuscular disorder (HCC)    Parkinson's disease    Osteopenia 06/2015   T score -2.4 distal third of the radius   Parkinson disease (HCC)    RBBB 10/07/2013   Thyroid  cancer (HCC)    Past Surgical History:  Procedure Laterality Date   APPENDECTOMY  1979   BREAST SURGERY  1979   MASTECTOMY AND RECONSTRUCTION   DILATION AND CURETTAGE OF  UTERUS     MISCARRIAGE   EYE SURGERY Bilateral    w/ IOL   HYSTEROSCOPY  2005,2009   AND D&C   MASTECTOMY Bilateral    THYROIDECTOMY     TONSILLECTOMY     TOTAL KNEE ARTHROPLASTY Right 10/07/2013   Procedure: RIGHT TOTAL KNEE ARTHROPLASTY;  Surgeon: Dempsey JINNY Sensor, MD;  Location: [facility] OR;  Service: Orthopedics;  Laterality: Right;   TUBAL LIGATION      Allergies  Allergen Reactions   Sulfa Antibiotics Rash   Biaxin [Clarithromycin] Other (See Comments)    Pt does not remember reaction    Ciprofloxacin Nausea And Vomiting   Hctz [Hydrochlorothiazide] Other (See Comments)    weakness and hyponatremia   Macrobid  [Nitrofurantoin ] Nausea Only   Other Nausea Only   Prednisone Other (See Comments)    Swelling and facial redness    Zofran  [Ondansetron ]    Sulfamethoxazole-Trimethoprim Other (See Comments) and Rash    Sore throat    Outpatient Encounter Medications as of 08/04/2023  Medication Sig   acetaminophen  (TYLENOL ) 500 MG tablet Take 1,000 mg by mouth every 8 (eight) hours as needed.   amLODipine  (NORVASC ) 5 MG tablet Take 5 mg by mouth daily.   carbidopa -levodopa  (SINEMET  IR) 25-100 MG tablet Take 1 tablet by mouth 4 (four) times daily. 0800, 1100, 1500, 2000  Carbidopa -Levodopa  ER (SINEMET  CR) 25-100 MG tablet controlled release Take 1 tablet by mouth at bedtime.   CRANBERRY-VITAMIN C -D MANNOSE PO Take 1 tablet by mouth 3 (three) times daily.   irbesartan  (AVAPRO ) 150 MG tablet Take 150 mg by mouth daily.   levothyroxine  (SYNTHROID ) 150 MCG tablet Take 1 tablet (150 mcg total) by mouth daily before breakfast.   LORazepam  (ATIVAN ) 0.5 MG tablet Take 0.25 mg by mouth daily as needed for anxiety.   polyethylene glycol (MIRALAX  / GLYCOLAX ) 17 g packet Take 8.5 g by mouth daily. (Patient taking differently: Take 17 g by mouth daily.)   QUEtiapine  (SEROQUEL ) 25 MG tablet Take 0.5 tablets (12.5 mg total) by mouth 2 (two) times daily.   senna-docusate (SENOKOT-S) 8.6-50 MG tablet  Take 1 tablet by mouth at bedtime.   traMADol  (ULTRAM ) 50 MG tablet Take 1 tablet (50 mg total) by mouth every 6 (six) hours as needed.   No facility-administered encounter medications on file as of 08/04/2023.    Review of Systems  Immunization History  Administered Date(s) Administered   DTP 01/24/1994   Fluad Quad(high Dose 65+) 11/15/2022   Fluzone Influenza virus vaccine,trivalent (IIV3), split virus 10/23/2009, 10/26/2010, 11/18/2011, 11/21/2012, 12/10/2019   Influenza Split 10/09/2007, 10/17/2008   Influenza Whole 10/09/2007   Influenza, High Dose Seasonal PF 11/05/2013, 10/31/2014, 10/12/2015, 10/20/2016, 11/15/2017   Influenza,inj,Quad PF,6+ Mos 10/26/2010   Influenza-Unspecified 11/05/2013, 10/31/2014, 10/12/2015, 10/20/2016, 11/15/2017, 11/13/2020, 10/29/2021   Moderna Covid-19 Fall Seasonal Vaccine 42yrs & older 11/29/2021   Moderna Covid-19 Vaccine Bivalent Booster 28yrs & up 05/19/2022, 11/15/2022   Moderna SARS-COV2 Booster Vaccination 11/28/2019, 05/28/2020, 11/06/2020   Moderna Sars-Covid-2 Vaccination 01/29/2019, 02/28/2019, 06/28/2021   PPD Test 06/17/2021   Pneumococcal Conjugate-13 10/23/2014   Pneumococcal Polysaccharide-23 10/25/2002   Rsv, Bivalent, Protein Subunit Rsvpref,pf [place]) 02/08/2022   Tdap 05/23/2021   Zoster, Live 10/13/2005   Zoster, Unspecified 10/13/2005   Pertinent  Health Maintenance Due  Topic Date Due   INFLUENZA VACCINE  08/25/2023   DEXA SCAN  Completed      04/29/2021   12:25 PM 05/23/2021   12:01 PM 07/11/2022    4:41 PM 12/05/2022   11:17 AM 01/06/2023   10:24 AM  Fall Risk  Falls in the past year?   1 0 0  Was there an injury with Fall?   0 0 0  Fall Risk Category Calculator   2 0 0  (RETIRED) Patient Fall Risk Level Low fall risk  Moderate fall risk      Patient at Risk for Falls Due to   History of fall(s);Impaired balance/gait;Impaired mobility No Fall Risks No Fall Risks  Fall risk Follow up   Falls evaluation  completed Falls evaluation completed Falls evaluation completed     Data saved with a previous flowsheet row definition   Functional Status Survey:    Vitals:   08/04/23 1020 08/04/23 1021  BP: (!) 141/88 (!) 162/90  Pulse: 73   SpO2: 92%   Weight: 139 lb 6.4 oz (63.2 kg)   Height: 5' 3 (1.6 m)    Body mass index is 24.69 kg/m. Physical Exam  Labs reviewed: Recent Labs    12/13/22 0000  NA 137  K 4.1  CL 105  CO2 24*  BUN 28*  CREATININE 0.5  CALCIUM 8.6*   Recent Labs    12/13/22 0000  AST 15  ALT 10  ALKPHOS 103  ALBUMIN 3.4*   Recent Labs    12/13/22 0000  WBC  5.7  HGB 12.3  HCT 36  PLT 208   Lab Results  Component Value Date   TSH 0.67 12/13/2022   Lab Results  Component Value Date   HGBA1C 5.6 10/15/2013   No results found for: CHOL, HDL, LDLCALC, LDLDIRECT, TRIG, CHOLHDL  Significant Diagnostic Results in last 30 days:  No results found.  Assessment/Plan There are no diagnoses linked to this encounter.   Family/ staff Communication: ***  Labs/tests ordered:  ***

## 2023-08-05 ENCOUNTER — Encounter: Payer: Self-pay | Admitting: Adult Health

## 2023-08-05 DIAGNOSIS — R569 Unspecified convulsions: Secondary | ICD-10-CM | POA: Insufficient documentation

## 2023-08-05 MED ORDER — POLYETHYLENE GLYCOL 3350 17 G PO PACK: 17.0000 g | PACK | Freq: Every day | ORAL | Status: AC

## 2023-08-05 NOTE — Assessment & Plan Note (Signed)
 Severe stage Continues on sinemet  Followed by hospice

## 2023-08-05 NOTE — Assessment & Plan Note (Signed)
 BP is elevated today with norvasc  and ARB therapy Would avoid aggressive management.  Continue current therapy and f/u next month Labs annually due to hospice status.

## 2023-08-05 NOTE — Assessment & Plan Note (Signed)
 TSH acceptable on levothyroxine 

## 2023-08-05 NOTE — Assessment & Plan Note (Signed)
Continue miralax and senokot

## 2023-08-05 NOTE — Assessment & Plan Note (Signed)
 Progressive decline in cognition and physical function c/w the disease. Continue supportive care in the skilled environment. Continue seroquel  for delusion thinking and anxiety Continue prn ativan 

## 2023-08-05 NOTE — Assessment & Plan Note (Signed)
 Has had possible seizure activity vs myoclonic jerks Avoiding aggressive work up due to hospice status.  No current issues.

## 2023-08-05 NOTE — Assessment & Plan Note (Signed)
 No current signs of depression.  Has had s/e from SSRIs and wellbutrin Continue current meds.

## 2023-09-14 ENCOUNTER — Non-Acute Institutional Stay (SKILLED_NURSING_FACILITY): Payer: Self-pay | Admitting: Adult Health

## 2023-09-14 ENCOUNTER — Encounter: Payer: Self-pay | Admitting: Adult Health

## 2023-09-14 DIAGNOSIS — M546 Pain in thoracic spine: Secondary | ICD-10-CM | POA: Diagnosis not present

## 2023-09-14 DIAGNOSIS — E890 Postprocedural hypothyroidism: Secondary | ICD-10-CM

## 2023-09-14 DIAGNOSIS — I10 Essential (primary) hypertension: Secondary | ICD-10-CM | POA: Diagnosis not present

## 2023-09-14 DIAGNOSIS — N390 Urinary tract infection, site not specified: Secondary | ICD-10-CM

## 2023-09-14 DIAGNOSIS — F02C2 Dementia in other diseases classified elsewhere, severe, with psychotic disturbance: Secondary | ICD-10-CM

## 2023-09-14 DIAGNOSIS — G20B2 Parkinson's disease with dyskinesia, with fluctuations: Secondary | ICD-10-CM | POA: Diagnosis not present

## 2023-09-14 DIAGNOSIS — G20A1 Parkinson's disease without dyskinesia, without mention of fluctuations: Secondary | ICD-10-CM

## 2023-09-14 NOTE — Progress Notes (Unsigned)
 Location:  [facility] Room Number: 130 P Place of Service:  SNF [phone]) Provider:  Tawni America, NP    Patient Care Team: Charlanne Fredia CROME, MD as PCP - General (Internal Medicine) Claudene Victory ORN, MD (Inactive) as Consulting Physician (Cardiology)  Extended Emergency Contact Information Primary Emergency Contact: Burnette,Erwin Address: [address]          [place], [place] [address] [place] Home Phone: [phone] Work Phone: [phone] Mobile Phone: [phone] Relation: Spouse Secondary Emergency Contact: Boeder,Theodore          [place]  , [place] Home Phone: [phone] Relation: Granddaughter  Code Status:  DNR Goals of care: Advanced Directive information    05/31/2023   11:09 AM  Advanced Directives  Does Patient Have a Medical Advance Directive? Yes  Type of Advance Directive Out of facility DNR (pink MOST or yellow form)  Does patient want to make changes to medical advance directive? No - Patient declined     Chief Complaint  Patient presents with   Routine Visit    HPI:  Pt is a 89 y.o. female seen today for medical management of chronic diseases.     Past Medical History:  Diagnosis Date   Anxiety    Arthritis    knee & back    Blood type, Rh negative    Breast cancer (HCC)    Chronic fatigue    Depression    Diverticulosis    Endocervical polyp    GERD (gastroesophageal reflux disease)    Hypertension    Hypothyroidism    IBS (irritable bowel syndrome)    Insomnia    Lymphedema    Neuromuscular disorder (HCC)    Parkinson's disease    Osteopenia 06/2015   T score -2.4 distal third of the radius   Parkinson disease (HCC)    RBBB 10/07/2013   Thyroid  cancer (HCC)    Past Surgical History:  Procedure Laterality Date   APPENDECTOMY  1979   BREAST SURGERY  1979   MASTECTOMY AND RECONSTRUCTION   DILATION AND CURETTAGE OF UTERUS     MISCARRIAGE   EYE SURGERY  Bilateral    w/ IOL   HYSTEROSCOPY  2005,2009   AND D&C   MASTECTOMY Bilateral    THYROIDECTOMY     TONSILLECTOMY     TOTAL KNEE ARTHROPLASTY Right 10/07/2013   Procedure: RIGHT TOTAL KNEE ARTHROPLASTY;  Surgeon: Dempsey JINNY Sensor, MD;  Location: [facility] OR;  Service: Orthopedics;  Laterality: Right;   TUBAL LIGATION      Allergies  Allergen Reactions   Sulfa Antibiotics Rash   Biaxin [Clarithromycin] Other (See Comments)    Pt does not remember reaction    Ciprofloxacin Nausea And Vomiting   Hctz [Hydrochlorothiazide] Other (See Comments)    weakness and hyponatremia   Macrobid  [Nitrofurantoin ] Nausea Only   Other Nausea Only   Prednisone Other (See Comments)    Swelling and facial redness    Zofran  [Ondansetron ]    Sulfamethoxazole-Trimethoprim Other (See Comments) and Rash    Sore throat    Outpatient Encounter Medications as of 09/14/2023  Medication Sig   amLODipine  (NORVASC ) 5 MG tablet Take 5 mg by mouth daily.   carbidopa -levodopa  (SINEMET  IR) 25-100 MG tablet Take 1 tablet by mouth 4 (four) times daily. 0800, 1100, 1500, 2000   Carbidopa -Levodopa  ER (SINEMET  CR) 25-100 MG tablet controlled release Take 1 tablet by mouth at bedtime.   CRANBERRY-VITAMIN C -D MANNOSE PO Take  1 tablet by mouth 3 (three) times daily.   irbesartan  (AVAPRO ) 150 MG tablet Take 150 mg by mouth daily.   levothyroxine  (SYNTHROID ) 150 MCG tablet Take 1 tablet (150 mcg total) by mouth daily before breakfast.   polyethylene glycol (MIRALAX  / GLYCOLAX ) 17 g packet Take 17 g by mouth daily.   QUEtiapine  (SEROQUEL ) 25 MG tablet Take 0.5 tablets (12.5 mg total) by mouth 2 (two) times daily.   senna-docusate (SENOKOT-S) 8.6-50 MG tablet Take 1 tablet by mouth at bedtime.   acetaminophen  (TYLENOL ) 500 MG tablet Take 1,000 mg by mouth every 8 (eight) hours as needed.   LORazepam  (ATIVAN ) 0.5 MG tablet Take 0.25 mg by mouth daily as needed for anxiety.   traMADol  (ULTRAM ) 50 MG tablet Take 1 tablet (50 mg total)  by mouth every 6 (six) hours as needed.   No facility-administered encounter medications on file as of 09/14/2023.    Review of Systems  Immunization History  Administered Date(s) Administered    sv, Bivalent, Protein Subunit Rsvpref,pf (Abrysvo) 02/08/2022   DTP 01/24/1994   Fluad Quad(high Dose 65+) 11/15/2022   Fluzone Influenza virus vaccine,trivalent (IIV3), split virus 10/23/2009, 10/26/2010, 11/18/2011, 11/21/2012, 12/10/2019   Influenza Split 10/09/2007, 10/17/2008   Influenza Whole 10/09/2007   Influenza, High Dose Seasonal PF 11/05/2013, 10/31/2014, 10/12/2015, 10/20/2016, 11/15/2017   Influenza,inj,Quad PF,6+ Mos 10/26/2010   Influenza-Unspecified 11/05/2013, 10/31/2014, 10/12/2015, 10/20/2016, 11/15/2017, 11/13/2020, 10/29/2021   Moderna Covid-19 Fall Seasonal Vaccine 17yrs & older 11/29/2021   Moderna Covid-19 Vaccine Bivalent Booster 33yrs & up 05/19/2022, 11/15/2022   Moderna SARS-COV2 Booster Vaccination 11/28/2019, 05/28/2020, 11/06/2020   Moderna Sars-Covid-2 Vaccination 01/29/2019, 02/28/2019, 06/28/2021   PPD Test 06/17/2021   Pneumococcal Conjugate-13 10/23/2014   Pneumococcal Polysaccharide-23 10/25/2002   Tdap 05/23/2021   Zoster, Live 10/13/2005   Zoster, Unspecified 10/13/2005   Pertinent  Health Maintenance Due  Topic Date Due   INFLUENZA VACCINE  08/25/2023   DEXA SCAN  Completed      04/29/2021   12:25 PM 05/23/2021   12:01 PM 07/11/2022    4:41 PM 12/05/2022   11:17 AM 01/06/2023   10:24 AM  Fall Risk  Falls in the past year?   1 0 0  Was there an injury with Fall?   0 0 0  Fall Risk Category Calculator   2 0 0  (RETIRED) Patient Fall Risk Level Low fall risk  Moderate fall risk      Patient at Risk for Falls Due to   History of fall(s);Impaired balance/gait;Impaired mobility No Fall Risks No Fall Risks  Fall risk Follow up   Falls evaluation completed Falls evaluation completed Falls evaluation completed     Data saved with a previous  flowsheet row definition   Functional Status Survey:    Vitals:   09/14/23 0920  BP: (!) 152/81  Pulse: 75  Resp: 18  Temp: (!) 97.3 F (36.3 C)  SpO2: 97%  Weight: 143 lb (64.9 kg)  Height: 5' 3 (1.6 m)   Body mass index is 25.33 kg/m. Physical Exam  Labs reviewed: Recent Labs    12/13/22 0000  NA 137  K 4.1  CL 105  CO2 24*  BUN 28*  CREATININE 0.5  CALCIUM 8.6*   Recent Labs    12/13/22 0000  AST 15  ALT 10  ALKPHOS 103  ALBUMIN 3.4*   Recent Labs    12/13/22 0000  WBC 5.7  HGB 12.3  HCT 36  PLT 208   Lab  Results  Component Value Date   TSH 0.67 12/13/2022   Lab Results  Component Value Date   HGBA1C 5.6 10/15/2013   No results found for: CHOL, HDL, LDLCALC, LDLDIRECT, TRIG, CHOLHDL  Significant Diagnostic Results in last 30 days:  No results found.  Assessment/Plan There are no diagnoses linked to this encounter.   Family/ staff Communication: ***  Labs/tests ordered:  ***

## 2023-09-15 ENCOUNTER — Encounter: Payer: Self-pay | Admitting: Adult Health

## 2023-09-15 NOTE — Assessment & Plan Note (Signed)
 Severe stage Continues on sinemet  Followed by hospice

## 2023-09-15 NOTE — Assessment & Plan Note (Signed)
 TSH acceptable on levothyroxine 

## 2023-09-15 NOTE — Assessment & Plan Note (Signed)
 Tramadol  ordered Not using, will leave since she is on hospice

## 2023-09-15 NOTE — Assessment & Plan Note (Signed)
 Progressive decline in cognition and physical function c/w the disease. Continue supportive care in the skilled environment. Continue seroquel  for delusion thinking and anxiety Continue prn ativan 

## 2023-09-15 NOTE — Assessment & Plan Note (Signed)
 Occasional elevation, goal <150/90 Would avoid aggressive management.  Continue current therapy and f/u next month Labs annually due to hospice status.

## 2023-09-15 NOTE — Assessment & Plan Note (Signed)
 No current symptoms On cranberrry supplement

## 2023-10-16 ENCOUNTER — Non-Acute Institutional Stay (SKILLED_NURSING_FACILITY): Payer: Self-pay | Admitting: Internal Medicine

## 2023-10-16 DIAGNOSIS — E890 Postprocedural hypothyroidism: Secondary | ICD-10-CM | POA: Diagnosis not present

## 2023-10-16 DIAGNOSIS — G20A1 Parkinson's disease without dyskinesia, without mention of fluctuations: Secondary | ICD-10-CM | POA: Diagnosis not present

## 2023-10-16 DIAGNOSIS — G20B2 Parkinson's disease with dyskinesia, with fluctuations: Secondary | ICD-10-CM | POA: Diagnosis not present

## 2023-10-16 DIAGNOSIS — F3341 Major depressive disorder, recurrent, in partial remission: Secondary | ICD-10-CM

## 2023-10-16 DIAGNOSIS — F02C2 Dementia in other diseases classified elsewhere, severe, with psychotic disturbance: Secondary | ICD-10-CM

## 2023-10-16 DIAGNOSIS — I10 Essential (primary) hypertension: Secondary | ICD-10-CM

## 2023-10-20 ENCOUNTER — Encounter: Payer: Self-pay | Admitting: Internal Medicine

## 2023-10-20 NOTE — Progress Notes (Signed)
 Location:  Medical illustrator of Service:  SNF (31)  Provider:   Code Status: DNR/hospice Goals of Care:     05/31/2023   11:09 AM  Advanced Directives  Does Patient Have a Medical Advance Directive? Yes  Type of Advance Directive Out of facility DNR (pink MOST or yellow form)  Does patient want to make changes to medical advance directive? No - Patient declined     Chief Complaint  Patient presents with   Care Management    HPI: Patient is a 89 y.o. female seen today for medical management of chronic diseases.    Lives in SNF in [place]   Enrolled in Hospice She has h/o  Parkinson.   h/o cognitive impairment  Hypertension Hypothyroidism Depression Urinary In continence Osteoporosis h/o Dyskinetic movement and slurred speech     She is stable. No new Nursing issues.  Her weight is stable Stays in Wheelchair No Falls Wt Readings from Last 3 Encounters:  10/20/23 144 lb (65.3 kg)  09/14/23 143 lb (64.9 kg)  08/04/23 139 lb 6.4 oz (63.2 kg)   Past Medical History:  Diagnosis Date   Anxiety    Arthritis    knee & back    Blood type, Rh negative    Breast cancer (HCC)    Chronic fatigue    Depression    Diverticulosis    Endocervical polyp    GERD (gastroesophageal reflux disease)    Hypertension    Hypothyroidism    IBS (irritable bowel syndrome)    Insomnia    Lymphedema    Neuromuscular disorder (HCC)    Parkinson's disease    Osteopenia 06/2015   T score -2.4 distal third of the radius   Parkinson disease (HCC)    RBBB 10/07/2013   Thyroid  cancer (HCC)     Past Surgical History:  Procedure Laterality Date   APPENDECTOMY  1979   BREAST SURGERY  1979   MASTECTOMY AND RECONSTRUCTION   DILATION AND CURETTAGE OF UTERUS     MISCARRIAGE   EYE SURGERY Bilateral    w/ IOL   HYSTEROSCOPY  2005,2009   AND D&C   MASTECTOMY Bilateral    THYROIDECTOMY     TONSILLECTOMY     TOTAL KNEE ARTHROPLASTY Right 10/07/2013   Procedure:  RIGHT TOTAL KNEE ARTHROPLASTY;  Surgeon: Dempsey JINNY Sensor, MD;  Location: [facility] OR;  Service: Orthopedics;  Laterality: Right;   TUBAL LIGATION      Allergies  Allergen Reactions   Sulfa Antibiotics Rash   Biaxin [Clarithromycin] Other (See Comments)    Pt does not remember reaction    Ciprofloxacin Nausea And Vomiting   Hctz [Hydrochlorothiazide] Other (See Comments)    weakness and hyponatremia   Macrobid  [Nitrofurantoin ] Nausea Only   Other Nausea Only   Prednisone Other (See Comments)    Swelling and facial redness    Zofran  [Ondansetron ]    Sulfamethoxazole-Trimethoprim Other (See Comments) and Rash    Sore throat    Outpatient Encounter Medications as of 10/16/2023  Medication Sig   acetaminophen  (TYLENOL ) 500 MG tablet Take 1,000 mg by mouth every 8 (eight) hours as needed.   amLODipine  (NORVASC ) 5 MG tablet Take 5 mg by mouth daily.   carbidopa -levodopa  (SINEMET  IR) 25-100 MG tablet Take 1 tablet by mouth 4 (four) times daily. 0800, 1100, 1500, 2000   Carbidopa -Levodopa  ER (SINEMET  CR) 25-100 MG tablet controlled release Take 1 tablet by mouth at bedtime.   CRANBERRY-VITAMIN C -D MANNOSE  PO Take 1 tablet by mouth 3 (three) times daily.   irbesartan  (AVAPRO ) 150 MG tablet Take 150 mg by mouth daily.   levothyroxine  (SYNTHROID ) 150 MCG tablet Take 1 tablet (150 mcg total) by mouth daily before breakfast.   LORazepam  (ATIVAN ) 0.5 MG tablet Take 0.25 mg by mouth daily as needed for anxiety.   polyethylene glycol (MIRALAX  / GLYCOLAX ) 17 g packet Take 17 g by mouth daily.   QUEtiapine  (SEROQUEL ) 25 MG tablet Take 0.5 tablets (12.5 mg total) by mouth 2 (two) times daily.   senna-docusate (SENOKOT-S) 8.6-50 MG tablet Take 1 tablet by mouth at bedtime.   traMADol  (ULTRAM ) 50 MG tablet Take 1 tablet (50 mg total) by mouth every 6 (six) hours as needed.   No facility-administered encounter medications on file as of 10/16/2023.    Review of Systems:  Review of Systems  Unable to perform  ROS: Dementia    Health Maintenance  Topic Date Due   Influenza Vaccine  08/25/2023   COVID-19 Vaccine (7 - 2025-26 season) 09/25/2023   Cervical Cancer Screening (Pap smear)  10/21/2023 (Originally 03/15/2013)   Zoster Vaccines- Shingrix (1 of 2) 12/25/2023 (Originally 03/09/1948)   DTaP/Tdap/Td (3 - Td or Tdap) 05/24/2031   Pneumococcal Vaccine: 50+ Years  Completed   DEXA SCAN  Completed   HPV VACCINES  Aged Out   Meningococcal B Vaccine  Aged Out    Physical Exam: There were no vitals filed for this visit. There is no height or weight on file to calculate BMI. Physical Exam Vitals reviewed.  Constitutional:      Appearance: Normal appearance.  HENT:     Head: Normocephalic.     Nose: Nose normal.     Mouth/Throat:     Mouth: Mucous membranes are moist.     Pharynx: Oropharynx is clear.  Eyes:     Pupils: Pupils are equal, round, and reactive to light.  Cardiovascular:     Rate and Rhythm: Normal rate and regular rhythm.     Pulses: Normal pulses.     Heart sounds: Normal heart sounds. No murmur heard. Pulmonary:     Effort: Pulmonary effort is normal.     Breath sounds: Normal breath sounds.  Abdominal:     General: Abdomen is flat. Bowel sounds are normal.     Palpations: Abdomen is soft.  Musculoskeletal:        General: No swelling.     Cervical back: Neck supple.  Skin:    General: Skin is warm.  Neurological:     Mental Status: She is alert.     Comments: Patient was responsive but continues to have Slurred speech She was very confused  Psychiatric:        Mood and Affect: Mood normal.        Thought Content: Thought content normal.     Labs reviewed: Basic Metabolic Panel: Recent Labs    12/13/22 0000  NA 137  K 4.1  CL 105  CO2 24*  BUN 28*  CREATININE 0.5  CALCIUM 8.6*  TSH 0.67   Liver Function Tests: Recent Labs    12/13/22 0000  AST 15  ALT 10  ALKPHOS 103  ALBUMIN 3.4*   No results for input(s): LIPASE, AMYLASE in the  last 8760 hours. No results for input(s): AMMONIA in the last 8760 hours. CBC: Recent Labs    12/13/22 0000  WBC 5.7  HGB 12.3  HCT 36  PLT 208   Lipid Panel:  No results for input(s): CHOL, HDL, LDLCALC, TRIG, CHOLHDL, LDLDIRECT in the last 8760 hours. Lab Results  Component Value Date   HGBA1C 5.6 10/15/2013    Procedures since last visit: No results found.  Assessment/Plan 1. 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 Parkinson's disease with dyskinesia and fluctuating manifestations (HCC) Sinemet   3. Essential hypertension Avapro  and Amlodipine   4. Hypothyroidism, postsurgical history of thyroid  cancer TSH normal in 11/94  5. Recurrent major depressive disorder, in partial remission Klonopin  and Seroquel   6 Back pain On Tramadol   Labs/tests ordered:  * No order type specified * Next appt:  Visit date not found

## 2023-11-20 ENCOUNTER — Non-Acute Institutional Stay (SKILLED_NURSING_FACILITY): Payer: Self-pay | Admitting: Adult Health

## 2023-11-20 ENCOUNTER — Encounter: Payer: Self-pay | Admitting: Adult Health

## 2023-11-20 DIAGNOSIS — I10 Essential (primary) hypertension: Secondary | ICD-10-CM | POA: Diagnosis not present

## 2023-11-20 DIAGNOSIS — G20B2 Parkinson's disease with dyskinesia, with fluctuations: Secondary | ICD-10-CM

## 2023-11-20 DIAGNOSIS — G20A1 Parkinson's disease without dyskinesia, without mention of fluctuations: Secondary | ICD-10-CM | POA: Diagnosis not present

## 2023-11-20 DIAGNOSIS — E890 Postprocedural hypothyroidism: Secondary | ICD-10-CM

## 2023-11-20 DIAGNOSIS — K5901 Slow transit constipation: Secondary | ICD-10-CM

## 2023-11-20 DIAGNOSIS — F02C2 Dementia in other diseases classified elsewhere, severe, with psychotic disturbance: Secondary | ICD-10-CM

## 2023-11-20 DIAGNOSIS — M546 Pain in thoracic spine: Secondary | ICD-10-CM

## 2023-11-20 MED ORDER — QUETIAPINE FUMARATE 25 MG PO TABS: 12.5000 mg | ORAL_TABLET | Freq: Every evening | ORAL | Status: AC

## 2023-11-20 NOTE — Assessment & Plan Note (Signed)
 Severe stage Continues on sinemet  Followed by hospice

## 2023-11-20 NOTE — Assessment & Plan Note (Signed)
 Continue tramadol

## 2023-11-20 NOTE — Assessment & Plan Note (Signed)
Continue miralax and senokot

## 2023-11-20 NOTE — Progress Notes (Signed)
 Location:  [facility] Room Number: [place] 1 !30 P Place of Service:  SNF (31) hospice Provider:  Tawni America, NP   Patient Care Team: Charlanne Fredia CROME, MD as PCP - General (Internal Medicine) Claudene Victory ORN, MD (Inactive) as Consulting Physician (Cardiology)  Extended Emergency Contact Information Primary Emergency Contact: Feagans,Erwin Address: [address]          [place], [place] [address] [place] Home Phone: [phone] Work Phone: [phone] Mobile Phone: [phone] Relation: Spouse Secondary Emergency Contact: Beebe,Theodore          [place]  , [place] Home Phone: [phone] Relation: Granddaughter  Code Status:  DNR Goals of care: Advanced Directive information    05/31/2023   11:09 AM  Advanced Directives  Does Patient Have a Medical Advance Directive? Yes  Type of Advance Directive Out of facility DNR (pink MOST or yellow form)  Does patient want to make changes to medical advance directive? No - Patient declined     Chief Complaint  Patient presents with   Routine VIsit    HPI:  Pt is a 89 y.o. female seen today for medical management of chronic diseases.     Enrolled in hospice due to PD, dementia, and functional decline.   Has 1:1 caregiver at bedside No acute concerns per nurse   Weight stable Wt Readings from Last 3 Encounters:  11/20/23 141 lb 6.4 oz (64.1 kg)  10/20/23 144 lb (65.3 kg)  09/14/23 143 lb (64.9 kg)    Continues with PD symptoms tremor,slow movement, rigidity, and dysphonia. Rigidity to BUE and BLE L>R  Periods of lucidity with periods of confusion.    Parkinson's related dementia:  CT head 05/23/21 noted atrophy with chronic small vessel ischemic changes. Off exelon   She is able to communicate. Needs help with feeding and other ADls. Using a lift for transfer and wears briefs due to incontinence.  Periods of anxiety and increased confusion. No  aggression or combativeness There were reports of behavior concerns but this was isolated and has not reoccurred  S/p thyroidectomy due to thyroid  ca, on synthroid  TSH 0.67 12/13/22  OP:T score -2.8 07/23/20, previous on reclast  per Dr Faythe in 2022.  Not currently on meds.   On miralax  and senokot, no c/o constipation  Tramadol  ordered for back pain. She has been using it for low left sided pain and it has been effective.  Past Medical History:  Diagnosis Date   Anxiety    Arthritis    knee & back    Blood type, Rh negative    Breast cancer (HCC)    Chronic fatigue    Depression    Diverticulosis    Endocervical polyp    GERD (gastroesophageal reflux disease)    Hypertension    Hypothyroidism    IBS (irritable bowel syndrome)    Insomnia    Lymphedema    Neuromuscular disorder (HCC)    Parkinson's disease    Osteopenia 06/2015   T score -2.4 distal third of the radius   Parkinson disease (HCC)    RBBB 10/07/2013   Thyroid  cancer (HCC)    Past Surgical History:  Procedure Laterality Date   APPENDECTOMY  1979   BREAST SURGERY  1979   MASTECTOMY AND RECONSTRUCTION   DILATION AND CURETTAGE OF UTERUS     MISCARRIAGE   EYE SURGERY Bilateral    w/ IOL   HYSTEROSCOPY  2005,2009   AND D&C  MASTECTOMY Bilateral    THYROIDECTOMY     TONSILLECTOMY     TOTAL KNEE ARTHROPLASTY Right 10/07/2013   Procedure: RIGHT TOTAL KNEE ARTHROPLASTY;  Surgeon: Dempsey JINNY Sensor, MD;  Location: [facility] OR;  Service: Orthopedics;  Laterality: Right;   TUBAL LIGATION      Allergies  Allergen Reactions   Sulfa Antibiotics Rash   Biaxin [Clarithromycin] Other (See Comments)    Pt does not remember reaction    Ciprofloxacin Nausea And Vomiting   Hctz [Hydrochlorothiazide] Other (See Comments)    weakness and hyponatremia   Macrobid  [Nitrofurantoin ] Nausea Only   Other Nausea Only   Prednisone Other (See Comments)    Swelling and facial redness    Zofran  [Ondansetron ]     Sulfamethoxazole-Trimethoprim Other (See Comments) and Rash    Sore throat    Outpatient Encounter Medications as of 11/20/2023  Medication Sig   acetaminophen  (TYLENOL ) 500 MG tablet Take 1,000 mg by mouth every 8 (eight) hours as needed.   amLODipine  (NORVASC ) 5 MG tablet Take 5 mg by mouth daily.   carbidopa -levodopa  (SINEMET  IR) 25-100 MG tablet Take 1 tablet by mouth 4 (four) times daily. 0800, 1100, 1500, 2000   Carbidopa -Levodopa  ER (SINEMET  CR) 25-100 MG tablet controlled release Take 1 tablet by mouth at bedtime.   CRANBERRY-VITAMIN C -D MANNOSE PO Take 1 tablet by mouth 3 (three) times daily.   irbesartan  (AVAPRO ) 150 MG tablet Take 150 mg by mouth daily.   levothyroxine  (SYNTHROID ) 150 MCG tablet Take 1 tablet (150 mcg total) by mouth daily before breakfast.   polyethylene glycol (MIRALAX  / GLYCOLAX ) 17 g packet Take 17 g by mouth daily.   QUEtiapine  (SEROQUEL ) 25 MG tablet Take 0.5 tablets (12.5 mg total) by mouth 2 (two) times daily. (Patient taking differently: Take 12.5 mg by mouth every evening.)   senna-docusate (SENOKOT-S) 8.6-50 MG tablet Take 1 tablet by mouth at bedtime.   traMADol  (ULTRAM ) 50 MG tablet Take 1 tablet (50 mg total) by mouth every 6 (six) hours as needed.   LORazepam  (ATIVAN ) 0.5 MG tablet Take 0.25 mg by mouth daily as needed for anxiety. (Patient not taking: Reported on 11/20/2023)   No facility-administered encounter medications on file as of 11/20/2023.    Review of Systems  Unable to perform ROS: Dementia    Immunization History  Administered Date(s) Administered    sv, Bivalent, Protein Subunit Rsvpref,pf (Abrysvo) 02/08/2022   DTP 01/24/1994   Fluad Quad(high Dose 65+) 11/15/2022   Fluzone Influenza virus vaccine,trivalent (IIV3), split virus 10/23/2009, 10/26/2010, 11/18/2011, 11/21/2012, 12/10/2019   INFLUENZA, HIGH DOSE SEASONAL PF 11/05/2013, 10/31/2014, 10/12/2015, 10/20/2016, 11/15/2017   Influenza Split 10/09/2007, 10/17/2008    Influenza Whole 10/09/2007   Influenza,inj,Quad PF,6+ Mos 10/26/2010   Influenza-Unspecified 11/05/2013, 10/31/2014, 10/12/2015, 10/20/2016, 11/15/2017, 11/13/2020, 10/29/2021, 11/01/2023   Moderna Covid-19 Fall Seasonal Vaccine 72yrs & older 11/29/2021   Moderna Covid-19 Vaccine Bivalent Booster 26yrs & up 05/19/2022, 11/15/2022   Moderna SARS-COV2 Booster Vaccination 11/28/2019, 05/28/2020, 11/06/2020   Moderna Sars-Covid-2 Vaccination 01/29/2019, 02/28/2019, 06/28/2021   PPD Test 06/17/2021   Pneumococcal Conjugate-13 10/23/2014   Pneumococcal Polysaccharide-23 10/25/2002   Tdap 05/23/2021   Unspecified SARS-COV-2 Vaccination 11/15/2023   Zoster, Live 10/13/2005   Zoster, Unspecified 10/13/2005   Pertinent  Health Maintenance Due  Topic Date Due   Influenza Vaccine  Completed   DEXA SCAN  Completed   Mammogram  Discontinued      04/29/2021   12:25 PM 05/23/2021   12:01 PM 07/11/2022  4:41 PM 12/05/2022   11:17 AM 01/06/2023   10:24 AM  Fall Risk  Falls in the past year?   1 0 0  Was there an injury with Fall?   0 0 0  Fall Risk Category Calculator   2 0 0  (RETIRED) Patient Fall Risk Level Low fall risk  Moderate fall risk      Patient at Risk for Falls Due to   History of fall(s);Impaired balance/gait;Impaired mobility No Fall Risks No Fall Risks  Fall risk Follow up   Falls evaluation completed Falls evaluation completed Falls evaluation completed     Data saved with a previous flowsheet row definition   Functional Status Survey:    Vitals:   11/20/23 1023  BP: 136/83  Pulse: 70  Resp: 18  Temp: (!) 97.2 F (36.2 C)  SpO2: 97%  Weight: 141 lb 6.4 oz (64.1 kg)  Height: 5' 3 (1.6 m)   Body mass index is 25.05 kg/m. Physical Exam Vitals and nursing note reviewed.  Constitutional:      Appearance: Normal appearance.     Comments: Sleepy but arouses  HENT:     Head: Normocephalic and atraumatic.     Nose: Nose normal.     Mouth/Throat:     Mouth: Mucous  membranes are moist.     Pharynx: Oropharynx is clear.  Eyes:     Conjunctiva/sclera: Conjunctivae normal.     Pupils: Pupils are equal, round, and reactive to light.  Cardiovascular:     Rate and Rhythm: Normal rate and regular rhythm.     Heart sounds: No murmur heard. Pulmonary:     Effort: Pulmonary effort is normal. No respiratory distress.     Breath sounds: Normal breath sounds. No wheezing.  Abdominal:     General: Bowel sounds are normal. There is no distension.     Palpations: Abdomen is soft.     Tenderness: There is no abdominal tenderness.  Musculoskeletal:     Cervical back: No rigidity.     Right lower leg: No edema.     Left lower leg: No edema.     Comments: Rigidity to BUE and BLE L>R  Lymphadenopathy:     Cervical: No cervical adenopathy.  Skin:    General: Skin is warm and dry.  Neurological:     General: No focal deficit present.     Mental Status: Mental status is at baseline.  Psychiatric:        Mood and Affect: Mood normal.     Labs reviewed: Recent Labs    12/13/22 0000  NA 137  K 4.1  CL 105  CO2 24*  BUN 28*  CREATININE 0.5  CALCIUM 8.6*   Recent Labs    12/13/22 0000  AST 15  ALT 10  ALKPHOS 103  ALBUMIN 3.4*   Recent Labs    12/13/22 0000  WBC 5.7  HGB 12.3  HCT 36  PLT 208   Lab Results  Component Value Date   TSH 0.67 12/13/2022   Lab Results  Component Value Date   HGBA1C 5.6 10/15/2013   No results found for: CHOL, HDL, LDLCALC, LDLDIRECT, TRIG, CHOLHDL  Significant Diagnostic Results in last 30 days:  No results found.  Assessment/Plan  Severe dementia due to Parkinson's disease, with psychotic disturbance (HCC) Progressive decline in cognition and physical function c/w the disease. Continue supportive care in the skilled environment. Continue seroquel  for delusion thinking and anxiety   Parkinson's disease (tremor, stiffness,  slow motion, unstable posture) (HCC) Severe stage Continues on  sinemet  Followed by hospice  Hypothyroidism, postsurgical history of thyroid  cancer Check TSH  Essential hypertension Controlled Check CMP  Slow transit constipation Continue miralax  and senokot.   BACK PAIN, THORACIC REGION, CHRONIC Continue tramadol    Labs/tests ordered:  CBC CMP TSH  1 week

## 2023-11-20 NOTE — Assessment & Plan Note (Signed)
Controlled. Check CMP.  

## 2023-11-20 NOTE — Assessment & Plan Note (Signed)
 Progressive decline in cognition and physical function c/w the disease. Continue supportive care in the skilled environment. Continue seroquel  for delusion thinking and anxiety

## 2023-11-20 NOTE — Assessment & Plan Note (Signed)
 Check TSH

## 2023-11-27 LAB — BASIC METABOLIC PANEL WITH GFR
BUN: 38 — AB (ref 4–21)
CO2: 27 — AB (ref 13–22)
Chloride: 104 (ref 99–108)
Creatinine: 0.8 (ref 0.5–1.1)
Glucose: 89
Potassium: 3.9 meq/L (ref 3.5–5.1)
Sodium: 140 (ref 137–147)

## 2023-11-27 LAB — CBC: RBC: 3.93 (ref 3.87–5.11)

## 2023-11-27 LAB — CBC AND DIFFERENTIAL
HCT: 36 (ref 36–46)
Hemoglobin: 12.1 (ref 12.0–16.0)
Platelets: 223 K/uL (ref 150–400)
WBC: 7.5

## 2023-11-27 LAB — HEPATIC FUNCTION PANEL
ALT: 10 U/L (ref 7–35)
AST: 17 (ref 13–35)
Alkaline Phosphatase: 107 (ref 25–125)
Bilirubin, Total: 0.4

## 2023-11-27 LAB — COMPREHENSIVE METABOLIC PANEL WITH GFR
Albumin: 3.4 — AB (ref 3.5–5.0)
Calcium: 8.9 (ref 8.7–10.7)
Globulin: 2.6
eGFR: 67

## 2023-12-01 ENCOUNTER — Non-Acute Institutional Stay (SKILLED_NURSING_FACILITY): Payer: Self-pay | Admitting: Adult Health

## 2023-12-01 DIAGNOSIS — G20B2 Parkinson's disease with dyskinesia, with fluctuations: Secondary | ICD-10-CM | POA: Diagnosis not present

## 2023-12-01 DIAGNOSIS — M545 Low back pain, unspecified: Secondary | ICD-10-CM

## 2023-12-01 DIAGNOSIS — G8929 Other chronic pain: Secondary | ICD-10-CM | POA: Diagnosis not present

## 2023-12-01 NOTE — Progress Notes (Signed)
 Location:  [facility] Service:  SNF (31)hospice  Provider:   Bari America, ANP [facility] [phone]   Charlanne Fredia CROME, MD  Patient Care Team: Charlanne Fredia CROME, MD as PCP - General (Internal Medicine) Claudene Victory ORN, MD (Inactive) as Consulting Physician (Cardiology)  Extended Emergency Contact Information Primary Emergency Contact: [facility] Address: [address]          [place], [place] [address] [place] Home Phone: [phone] Work Phone: [phone] Mobile Phone: [phone] Relation: Spouse Secondary Emergency Contact: Lachman,Theodore          [place]  , [place] Home Phone: [phone] Relation: Granddaughter  Code Status:  DNR Goals of care: Advanced Directive information    05/31/2023   11:09 AM  Advanced Directives  Does Patient Have a Medical Advance Directive? Yes  Type of Advance Directive Out of facility DNR (pink MOST or yellow form)  Does patient want to make changes to medical advance directive? No - Patient declined     Chief Complaint  Patient presents with   Acute Visit    Concern for back pain    HPI:  Pt is a 89 y.o. female seen today for an acute visit for back pain and jerking   Her husband let me know that she was having some jerking motions which are not new and also low back pain. This is also chronic for which she has tylenol  and ultram  ordered. Her nurse in skilled care confirmed that she is experiencing more low back pain but that she finds relief from the ultram  and/or tylenol .  Michelle Sims has PD and dementia and has a limited ability to give us  a hx. For my visit she denies any dysuria or bladder pain. No constipation is noted. She does confirm back pain but feels it is better today. She is followed by hospice due to PD and dementia. Her caregiver reports that she spends most of the morning in her chair and takes a break in the bed after lunch.   In regards to the jerking motions with her PD we have noticed that she has periods of jerking and or twitching that come and go. The nurse was not able to say for certain that this coincided with meds wearing off however, she felt that the twitching was worse when her back was hurting.   Past Medical History:  Diagnosis Date   Anxiety    Arthritis    knee & back    Blood type, Rh negative    Breast cancer (HCC)    Chronic fatigue    Depression    Diverticulosis    Endocervical polyp    GERD (gastroesophageal reflux disease)    Hypertension    Hypothyroidism    IBS (irritable bowel syndrome)    Insomnia    Lymphedema    Neuromuscular disorder (HCC)    Parkinson's disease    Osteopenia 06/2015   T score -2.4 distal third of the radius   Parkinson disease (HCC)    RBBB 10/07/2013   Thyroid  cancer (HCC)    Past Surgical History:  Procedure Laterality Date   APPENDECTOMY  1979   BREAST SURGERY  1979   MASTECTOMY AND RECONSTRUCTION   DILATION AND CURETTAGE OF UTERUS     MISCARRIAGE   EYE SURGERY Bilateral    w/ IOL   HYSTEROSCOPY  2005,2009   AND D&C   MASTECTOMY Bilateral    THYROIDECTOMY  TONSILLECTOMY     TOTAL KNEE ARTHROPLASTY Right 10/07/2013   Procedure: RIGHT TOTAL KNEE ARTHROPLASTY;  Surgeon: Dempsey JINNY Sensor, MD;  Location: [facility] OR;  Service: Orthopedics;  Laterality: Right;   TUBAL LIGATION      Allergies  Allergen Reactions   Sulfa Antibiotics Rash   Biaxin [Clarithromycin] Other (See Comments)    Pt does not remember reaction    Ciprofloxacin Nausea And Vomiting   Hctz [Hydrochlorothiazide] Other (See Comments)    weakness and hyponatremia   Macrobid  [Nitrofurantoin ] Nausea Only   Other Nausea Only   Prednisone Other (See Comments)    Swelling and facial redness    Zofran  [Ondansetron ]    Sulfamethoxazole-Trimethoprim Other (See Comments) and Rash    Sore throat    Outpatient Encounter Medications as of 12/01/2023  Medication Sig   acetaminophen   (TYLENOL ) 500 MG tablet Take 1,000 mg by mouth every 8 (eight) hours as needed.   amLODipine  (NORVASC ) 5 MG tablet Take 5 mg by mouth daily.   carbidopa -levodopa  (SINEMET  IR) 25-100 MG tablet Take 1 tablet by mouth 4 (four) times daily. 0800, 1100, 1500, 2000   Carbidopa -Levodopa  ER (SINEMET  CR) 25-100 MG tablet controlled release Take 1 tablet by mouth at bedtime.   CRANBERRY-VITAMIN C -D MANNOSE PO Take 1 tablet by mouth 3 (three) times daily.   irbesartan  (AVAPRO ) 150 MG tablet Take 150 mg by mouth daily.   levothyroxine  (SYNTHROID ) 150 MCG tablet Take 1 tablet (150 mcg total) by mouth daily before breakfast.   polyethylene glycol (MIRALAX  / GLYCOLAX ) 17 g packet Take 17 g by mouth daily.   QUEtiapine  (SEROQUEL ) 25 MG tablet Take 0.5 tablets (12.5 mg total) by mouth every evening.   senna-docusate (SENOKOT-S) 8.6-50 MG tablet Take 1 tablet by mouth at bedtime.   traMADol  (ULTRAM ) 50 MG tablet Take 1 tablet (50 mg total) by mouth every 6 (six) hours as needed.   No facility-administered encounter medications on file as of 12/01/2023.    Review of Systems  Unable to perform ROS: Dementia    Immunization History  Administered Date(s) Administered    sv, Bivalent, Protein Subunit Rsvpref,pf (Abrysvo) 02/08/2022   DTP 01/24/1994   Fluad Quad(high Dose 65+) 11/15/2022   Fluzone Influenza virus vaccine,trivalent (IIV3), split virus 10/23/2009, 10/26/2010, 11/18/2011, 11/21/2012, 12/10/2019   INFLUENZA, HIGH DOSE SEASONAL PF 11/05/2013, 10/31/2014, 10/12/2015, 10/20/2016, 11/15/2017   Influenza Split 10/09/2007, 10/17/2008   Influenza Whole 10/09/2007   Influenza,inj,Quad PF,6+ Mos 10/26/2010   Influenza-Unspecified 11/05/2013, 10/31/2014, 10/12/2015, 10/20/2016, 11/15/2017, 11/13/2020, 10/29/2021, 11/01/2023   Moderna Covid-19 Fall Seasonal Vaccine 65yrs & older 11/29/2021   Moderna Covid-19 Vaccine Bivalent Booster 79yrs & up 05/19/2022, 11/15/2022   Moderna SARS-COV2 Booster Vaccination  11/28/2019, 05/28/2020, 11/06/2020   Moderna Sars-Covid-2 Vaccination 01/29/2019, 02/28/2019, 06/28/2021   PPD Test 06/17/2021   Pneumococcal Conjugate-13 10/23/2014   Pneumococcal Polysaccharide-23 10/25/2002   Tdap 05/23/2021   Unspecified SARS-COV-2 Vaccination 11/15/2023   Zoster, Live 10/13/2005   Zoster, Unspecified 10/13/2005   Pertinent  Health Maintenance Due  Topic Date Due   Influenza Vaccine  Completed   DEXA SCAN  Completed   Mammogram  Discontinued      04/29/2021   12:25 PM 05/23/2021   12:01 PM 07/11/2022    4:41 PM 12/05/2022   11:17 AM 01/06/2023   10:24 AM  Fall Risk  Falls in the past year?   1 0 0  Was there an injury with Fall?   0 0 0  Fall Risk Category Calculator  2 0 0  (RETIRED) Patient Fall Risk Level Low fall risk  Moderate fall risk      Patient at Risk for Falls Due to   History of fall(s);Impaired balance/gait;Impaired mobility No Fall Risks No Fall Risks  Fall risk Follow up   Falls evaluation completed Falls evaluation completed Falls evaluation completed     Data saved with a previous flowsheet row definition   Functional Status Survey:    Vitals:   12/01/23 0841  BP: 121/79  Pulse: 81  Resp: 17  Temp: 97.9 F (36.6 C)  SpO2: 95%   There is no height or weight on file to calculate BMI. Physical Exam Vitals and nursing note reviewed.  Constitutional:      General: She is not in acute distress.    Appearance: She is not diaphoretic.  HENT:     Head: Normocephalic and atraumatic.  Neck:     Vascular: No JVD.  Cardiovascular:     Rate and Rhythm: Normal rate and regular rhythm.     Heart sounds: No murmur heard. Pulmonary:     Effort: Pulmonary effort is normal. No respiratory distress.     Breath sounds: Normal breath sounds. No wheezing.  Abdominal:     General: There is no distension.     Palpations: Abdomen is soft.     Tenderness: There is no abdominal tenderness. There is no right CVA tenderness or left CVA tenderness.   Musculoskeletal:     Thoracic back: Normal.     Lumbar back: Normal. No swelling, tenderness or bony tenderness. Negative right straight leg raise test and negative left straight leg raise test.     Right lower leg: No edema.     Left lower leg: No edema.     Comments: Rigidity to BUE and BLE  Skin:    General: Skin is warm and dry.  Neurological:     General: No focal deficit present.     Mental Status: She is alert. Mental status is at baseline.     Labs reviewed: Recent Labs    12/13/22 0000  NA 137  K 4.1  CL 105  CO2 24*  BUN 28*  CREATININE 0.5  CALCIUM 8.6*   Recent Labs    12/13/22 0000  AST 15  ALT 10  ALKPHOS 103  ALBUMIN 3.4*   Recent Labs    12/13/22 0000  WBC 5.7  HGB 12.3  HCT 36  PLT 208   Lab Results  Component Value Date   TSH 0.67 12/13/2022   Lab Results  Component Value Date   HGBA1C 5.6 10/15/2013   No results found for: CHOL, HDL, LDLCALC, LDLDIRECT, TRIG, CHOLHDL  Significant Diagnostic Results in last 30 days:  No results found.  Assessment/Plan  1. Parkinson's disease with dyskinesia and fluctuating manifestations (HCC) (Primary) On going periods of fluctuating dyskinesia unchanged from prior exam  2. Chronic bilateral low back pain without sciatica Likely degenerative changes and positional Recommend continuing the tylenol  and ultram  Position changes regularly Monitor for constipation and dysuria

## 2023-12-18 ENCOUNTER — Encounter: Payer: Self-pay | Admitting: Adult Health

## 2023-12-18 ENCOUNTER — Non-Acute Institutional Stay (SKILLED_NURSING_FACILITY): Payer: Self-pay | Admitting: Adult Health

## 2023-12-18 DIAGNOSIS — R569 Unspecified convulsions: Secondary | ICD-10-CM

## 2023-12-18 DIAGNOSIS — E890 Postprocedural hypothyroidism: Secondary | ICD-10-CM | POA: Diagnosis not present

## 2023-12-18 DIAGNOSIS — I10 Essential (primary) hypertension: Secondary | ICD-10-CM | POA: Diagnosis not present

## 2023-12-18 DIAGNOSIS — G20A1 Parkinson's disease without dyskinesia, without mention of fluctuations: Secondary | ICD-10-CM | POA: Diagnosis not present

## 2023-12-18 DIAGNOSIS — K5901 Slow transit constipation: Secondary | ICD-10-CM

## 2023-12-18 DIAGNOSIS — M546 Pain in thoracic spine: Secondary | ICD-10-CM | POA: Diagnosis not present

## 2023-12-18 DIAGNOSIS — F02C2 Dementia in other diseases classified elsewhere, severe, with psychotic disturbance: Secondary | ICD-10-CM

## 2023-12-18 DIAGNOSIS — G20B2 Parkinson's disease with dyskinesia, with fluctuations: Secondary | ICD-10-CM

## 2023-12-18 NOTE — Assessment & Plan Note (Signed)
 Progressive decline in cognition and physical function c/w the disease. Continue supportive care in the skilled environment. Continue seroquel  for delusion thinking and anxiety

## 2023-12-18 NOTE — Assessment & Plan Note (Signed)
 Has had possible seizure activity vs myoclonic jerks Avoiding aggressive work up due to hospice status.  No current issues.

## 2023-12-18 NOTE — Assessment & Plan Note (Signed)
Continue miralax and senokot

## 2023-12-18 NOTE — Assessment & Plan Note (Signed)
 Continue tramadol  and tylenol 

## 2023-12-18 NOTE — Assessment & Plan Note (Signed)
 Controlled Cr ok, BUN elevated likely due to fluid intake.  Encourage fluid intake.

## 2023-12-18 NOTE — Assessment & Plan Note (Signed)
 TSH not done, Free t 4ok Continue levothyroxine .

## 2023-12-18 NOTE — Progress Notes (Signed)
 Location:  [facility] Room Number: 130 P Place of Service:  SNF (31)hospice  Provider:  Tawni America, NP    Patient Care Team: Charlanne Fredia CROME, MD as PCP - General (Internal Medicine) Claudene Victory ORN, MD (Inactive) as Consulting Physician (Cardiology)  Extended Emergency Contact Information Primary Emergency Contact: [facility] Address: [address]          [place], [place] [address] [place] Home Phone: [phone] Work Phone: [phone] Mobile Phone: [phone] Relation: Spouse Secondary Emergency Contact: Hinckley,Theodore          [place]  , [place] Home Phone: [phone] Relation: Granddaughter  Code Status:  DNR Goals of care: Advanced Directive information    05/31/2023   11:09 AM  Advanced Directives  Does Patient Have a Medical Advance Directive? Yes  Type of Advance Directive Out of facility DNR (pink MOST or yellow form)  Does patient want to make changes to medical advance directive? No - Patient declined     Chief Complaint  Patient presents with   Routine Visit    HPI:  Pt is a 89 y.o. female seen today for medical management of chronic diseases.    Enrolled in hospice due to PD, dementia, and functional decline.   Has 1:1 caregiver at bedside  Experiences increased pain to the mid and low back, relieved with tramadol  and tylenol    HTN: controlled BUN 38/Cr 0.8 11/27/22  Wt Readings from Last 3 Encounters:  12/18/23 138 lb 9.6 oz (62.9 kg)  11/20/23 141 lb 6.4 oz (64.1 kg)  10/20/23 144 lb (65.3 kg)   Weight down 6 lbs in the past few months.   Continues with PD symptoms tremor,slow movement, rigidity, and dysphonia. Rigidity to BUE and BLE L>R  Periods of lucidity with periods of confusion.    Parkinson's related dementia:  CT head 05/23/21 noted atrophy with chronic small vessel ischemic changes. Off exelon   She is able to communicate. Needs help with  feeding and other ADls. Using a lift for transfer and wears briefs due to incontinence.   S/p thyroidectomy due to thyroid  ca, on synthroid  Free T4 1.65 11/27/23 TSH not done  OP:T score -2.8 07/23/20, previous on reclast  per Dr Faythe in 2022.  Not currently on meds.   On miralax  and senokot, no c/o constipation  Tramadol  ordered for back pain. She has been using it for low left sided pain and it has been effective.  Past Medical History:  Diagnosis Date   Anxiety    Arthritis    knee & back    Blood type, Rh negative    Breast cancer (HCC)    Chronic fatigue    Depression    Diverticulosis    Endocervical polyp    GERD (gastroesophageal reflux disease)    Hypertension    Hypothyroidism    IBS (irritable bowel syndrome)    Insomnia    Lymphedema    Neuromuscular disorder (HCC)    Parkinson's disease    Osteopenia 06/2015   T score -2.4 distal third of the radius   Parkinson disease (HCC)    RBBB 10/07/2013   Thyroid  cancer (HCC)    Past Surgical History:  Procedure Laterality Date   APPENDECTOMY  1979   BREAST SURGERY  1979   MASTECTOMY AND RECONSTRUCTION   DILATION AND CURETTAGE OF UTERUS     MISCARRIAGE   EYE SURGERY Bilateral    w/ IOL   HYSTEROSCOPY  [id number]  AND D&C   MASTECTOMY Bilateral    THYROIDECTOMY     TONSILLECTOMY     TOTAL KNEE ARTHROPLASTY Right 10/07/2013   Procedure: RIGHT TOTAL KNEE ARTHROPLASTY;  Surgeon: Dempsey JINNY Sensor, MD;  Location: [facility] OR;  Service: Orthopedics;  Laterality: Right;   TUBAL LIGATION      Allergies  Allergen Reactions   Sulfa Antibiotics Rash   Biaxin [Clarithromycin] Other (See Comments)    Pt does not remember reaction    Ciprofloxacin Nausea And Vomiting   Hctz [Hydrochlorothiazide] Other (See Comments)    weakness and hyponatremia   Macrobid  [Nitrofurantoin ] Nausea Only   Other Nausea Only   Prednisone Other (See Comments)    Swelling and facial redness    Zofran  [Ondansetron ]    Sulfamethoxazole-Trimethoprim  Other (See Comments) and Rash    Sore throat    Outpatient Encounter Medications as of 12/18/2023  Medication Sig   acetaminophen  (TYLENOL ) 325 MG tablet Take 650 mg by mouth in the morning and at bedtime.   amLODipine  (NORVASC ) 5 MG tablet Take 5 mg by mouth daily.   carbidopa -levodopa  (SINEMET  IR) 25-100 MG tablet Take 1 tablet by mouth 4 (four) times daily. 0800, 1100, 1500, 2000   Carbidopa -Levodopa  ER (SINEMET  CR) 25-100 MG tablet controlled release Take 1 tablet by mouth at bedtime.   CRANBERRY-VITAMIN C -D MANNOSE PO Take 1 tablet by mouth 3 (three) times daily.   irbesartan  (AVAPRO ) 150 MG tablet Take 150 mg by mouth daily.   levothyroxine  (SYNTHROID ) 150 MCG tablet Take 1 tablet (150 mcg total) by mouth daily before breakfast.   LORazepam  (ATIVAN ) 0.5 MG tablet Take 0.5 mg by mouth every 12 (twelve) hours as needed for anxiety.   polyethylene glycol (MIRALAX  / GLYCOLAX ) 17 g packet Take 17 g by mouth daily.   QUEtiapine  (SEROQUEL ) 25 MG tablet Take 0.5 tablets (12.5 mg total) by mouth every evening.   senna-docusate (SENOKOT-S) 8.6-50 MG tablet Take 1 tablet by mouth at bedtime.   traMADol  (ULTRAM ) 50 MG tablet Take 1 tablet (50 mg total) by mouth every 6 (six) hours as needed.   acetaminophen  (TYLENOL ) 500 MG tablet Take 1,000 mg by mouth every 8 (eight) hours as needed. (Patient not taking: Reported on 12/18/2023)   No facility-administered encounter medications on file as of 12/18/2023.    Review of Systems  Unable to perform ROS: Dementia    Immunization History  Administered Date(s) Administered    sv, Bivalent, Protein Subunit Rsvpref,pf (Abrysvo) 02/08/2022   DTP 01/24/1994   Fluad Quad(high Dose 65+) 11/15/2022   Fluzone Influenza virus vaccine,trivalent (IIV3), split virus 10/23/2009, 10/26/2010, 11/18/2011, 11/21/2012, 12/10/2019   INFLUENZA, HIGH DOSE SEASONAL PF 11/05/2013, 10/31/2014, 10/12/2015, 10/20/2016, 11/15/2017   Influenza Split 10/09/2007, 10/17/2008    Influenza Whole 10/09/2007   Influenza,inj,Quad PF,6+ Mos 10/26/2010   Influenza-Unspecified 11/05/2013, 10/31/2014, 10/12/2015, 10/20/2016, 11/15/2017, 11/13/2020, 10/29/2021, 11/01/2023   Moderna Covid-19 Fall Seasonal Vaccine 39yrs & older 11/29/2021   Moderna Covid-19 Vaccine Bivalent Booster 37yrs & up 05/19/2022, 11/15/2022   Moderna SARS-COV2 Booster Vaccination 11/28/2019, 05/28/2020, 11/06/2020   Moderna Sars-Covid-2 Vaccination 01/29/2019, 02/28/2019, 06/28/2021   PPD Test 06/17/2021   Pneumococcal Conjugate-13 10/23/2014   Pneumococcal Polysaccharide-23 10/25/2002   Tdap 05/23/2021   Unspecified SARS-COV-2 Vaccination 11/15/2023   Zoster, Live 10/13/2005   Zoster, Unspecified 10/13/2005   Pertinent  Health Maintenance Due  Topic Date Due   Influenza Vaccine  Completed   Bone Density Scan  Completed   Mammogram  Discontinued  04/29/2021   12:25 PM 05/23/2021   12:01 PM 07/11/2022    4:41 PM 12/05/2022   11:17 AM 01/06/2023   10:24 AM  Fall Risk  Falls in the past year?   1 0 0  Was there an injury with Fall?   0 0 0  Fall Risk Category Calculator   2 0 0  (RETIRED) Patient Fall Risk Level Low fall risk  Moderate fall risk      Patient at Risk for Falls Due to   History of fall(s);Impaired balance/gait;Impaired mobility No Fall Risks No Fall Risks  Fall risk Follow up   Falls evaluation completed Falls evaluation completed Falls evaluation completed     Data saved with a previous flowsheet row definition   Functional Status Survey:    Vitals:   12/18/23 1314  BP: 129/86  Pulse: 71  Resp: 18  Temp: (!) 97.5 F (36.4 C)  SpO2: 93%  Weight: 138 lb 9.6 oz (62.9 kg)  Height: 5' 3 (1.6 m)   Body mass index is 24.55 kg/m. Physical Exam Vitals and nursing note reviewed.  Constitutional:      General: She is not in acute distress.    Appearance: She is not diaphoretic.  HENT:     Head: Normocephalic and atraumatic.  Neck:     Vascular: No JVD.   Cardiovascular:     Rate and Rhythm: Normal rate and regular rhythm.     Heart sounds: No murmur heard. Pulmonary:     Effort: Pulmonary effort is normal. No respiratory distress.     Breath sounds: Normal breath sounds. No wheezing.  Abdominal:     General: Abdomen is flat. Bowel sounds are normal. There is no distension.     Palpations: Abdomen is soft.     Tenderness: There is no abdominal tenderness.  Musculoskeletal:     Comments: Strength to BLE 3/5 Rigidity to BUE and BLE L>R  Skin:    General: Skin is warm and dry.  Neurological:     General: No focal deficit present.     Mental Status: She is alert. Mental status is at baseline.  Psychiatric:        Mood and Affect: Mood normal.     Labs reviewed: Recent Labs    11/27/23 0000  NA 140  K 3.9  CL 104  CO2 27*  BUN 38*  CREATININE 0.8  CALCIUM 8.9   Recent Labs    11/27/23 0000  AST 17  ALT 10  ALKPHOS 107  ALBUMIN 3.4*   Recent Labs    11/27/23 0000  WBC 7.5  HGB 12.1  HCT 36  PLT 223   Lab Results  Component Value Date   TSH 0.67 12/13/2022   Lab Results  Component Value Date   HGBA1C 5.6 10/15/2013   No results found for: CHOL, HDL, LDLCALC, LDLDIRECT, TRIG, CHOLHDL  Significant Diagnostic Results in last 30 days:  No results found.  Assessment/Plan  BACK PAIN, THORACIC REGION, CHRONIC Continue tramadol  and tylenol    Essential hypertension Controlled Cr ok, BUN elevated likely due to fluid intake.  Encourage fluid intake.   Hypothyroidism, postsurgical history of thyroid  cancer TSH not done, Free t 4ok Continue levothyroxine .   Severe dementia due to Parkinson's disease, with psychotic disturbance (HCC) Progressive decline in cognition and physical function c/w the disease. Continue supportive care in the skilled environment. Continue seroquel  for delusion thinking and anxiety   Slow transit constipation Continue miralax  and senokot.  Seizures (HCC) Has  had possible seizure activity vs myoclonic jerks Avoiding aggressive work up due to hospice status.  No current issues.   Parkinson's disease (tremor, stiffness, slow motion, unstable posture) (HCC) Severe stage Continues on sinemet  Followed by hospice

## 2023-12-18 NOTE — Assessment & Plan Note (Signed)
 Severe stage Continues on sinemet  Followed by hospice

## 2023-12-25 ENCOUNTER — Other Ambulatory Visit: Payer: Self-pay | Admitting: Adult Health

## 2023-12-25 DIAGNOSIS — G8929 Other chronic pain: Secondary | ICD-10-CM

## 2023-12-25 MED ORDER — TRAMADOL HCL 50 MG PO TABS: 50.0000 mg | ORAL_TABLET | Freq: Four times a day (QID) | ORAL | 2 refills | Status: AC | PRN

## 2024-01-08 ENCOUNTER — Non-Acute Institutional Stay (SKILLED_NURSING_FACILITY): Payer: Self-pay | Admitting: Internal Medicine

## 2024-01-08 DIAGNOSIS — E890 Postprocedural hypothyroidism: Secondary | ICD-10-CM | POA: Diagnosis not present

## 2024-01-08 DIAGNOSIS — F3341 Major depressive disorder, recurrent, in partial remission: Secondary | ICD-10-CM | POA: Diagnosis not present

## 2024-01-08 DIAGNOSIS — G20A1 Parkinson's disease without dyskinesia, without mention of fluctuations: Secondary | ICD-10-CM

## 2024-01-08 DIAGNOSIS — M545 Low back pain, unspecified: Secondary | ICD-10-CM | POA: Diagnosis not present

## 2024-01-08 DIAGNOSIS — F02C2 Dementia in other diseases classified elsewhere, severe, with psychotic disturbance: Secondary | ICD-10-CM

## 2024-01-08 DIAGNOSIS — G8929 Other chronic pain: Secondary | ICD-10-CM | POA: Diagnosis not present

## 2024-01-08 DIAGNOSIS — I10 Essential (primary) hypertension: Secondary | ICD-10-CM

## 2024-01-08 DIAGNOSIS — G20B2 Parkinson's disease with dyskinesia, with fluctuations: Secondary | ICD-10-CM

## 2024-01-17 NOTE — Progress Notes (Unsigned)
 " Provider:   Location:  [facility] Illustrator of Service:  SNF (31)  PCP: Charlanne Fredia CROME, MD Patient Care Team: Charlanne Fredia CROME, MD as PCP - General (Internal Medicine) Claudene Victory ORN, MD (Inactive) as Consulting Physician (Cardiology)  Extended Emergency Contact Information Primary Emergency Contact: [facility] Address: [address]          [place], [place] [address] [place] Home Phone: [phone] Work Phone: [phone] Mobile Phone: [phone] Relation: Spouse Secondary Emergency Contact: Leverich,Theodore          [place]  , [place] Home Phone: [phone] Relation: Granddaughter  Code Status: DNR/Hospice Goals of Care: Advanced Directive information    05/31/2023   11:09 AM  Advanced Directives  Does Patient Have a Medical Advance Directive? Yes  Type of Advance Directive Out of facility DNR (pink MOST or yellow form)  Does patient want to make changes to medical advance directive? No - Patient declined      Chief Complaint  Patient presents with   Care Management    HPI: Patient is a 89 y.o. female seen today for  Routine Visit  Lives in SNF in [place]   Enrolled in Hospice She has h/o  Parkinson.   h/o cognitive impairment  Hypertension Hypothyroidism Depression Urinary In continence Osteoporosis h/o Dyskinetic movement and slurred speech   Continues to be stable Has sitter during the daytime Per nurses she has been doing well with no issues No Falls Needs Hoyer lift and assist with Feeding Wt Readings from Last 3 Encounters:  01/08/24 140 lb (63.5 kg)  12/18/23 138 lb 9.6 oz (62.9 kg)  11/20/23 141 lb 6.4 oz (64.1 kg)    Past Medical History:  Diagnosis Date   Anxiety    Arthritis    knee & back    Blood type, Rh negative    Breast cancer (HCC)    Chronic fatigue    Depression    Diverticulosis    Endocervical polyp    GERD (gastroesophageal reflux disease)    Hypertension     Hypothyroidism    IBS (irritable bowel syndrome)    Insomnia    Lymphedema    Neuromuscular disorder (HCC)    Parkinson's disease    Osteopenia 06/2015   T score -2.4 distal third of the radius   Parkinson disease (HCC)    RBBB 10/07/2013   Thyroid  cancer (HCC)    Past Surgical History:  Procedure Laterality Date   APPENDECTOMY  1979   BREAST SURGERY  1979   MASTECTOMY AND RECONSTRUCTION   DILATION AND CURETTAGE OF UTERUS     MISCARRIAGE   EYE SURGERY Bilateral    w/ IOL   HYSTEROSCOPY  2005,2009   AND D&C   MASTECTOMY Bilateral    THYROIDECTOMY     TONSILLECTOMY     TOTAL KNEE ARTHROPLASTY Right 10/07/2013   Procedure: RIGHT TOTAL KNEE ARTHROPLASTY;  Surgeon: Dempsey JINNY Sensor, MD;  Location: [facility] OR;  Service: Orthopedics;  Laterality: Right;   TUBAL LIGATION      reports that she has never smoked. She has never used smokeless tobacco. She reports current alcohol use. She reports that she does not use drugs. Social History   Socioeconomic History   Marital status: Married    Spouse name: Not on file   Number of children: Not on file   Years of education: Not on file   Highest education level: Not on file  Occupational History  Occupation: retired    Comment: odd jobs  Tobacco Use   Smoking status: Never   Smokeless tobacco: Never  Vaping Use   Vaping status: Never Used  Substance and Sexual Activity   Alcohol use: Yes    Alcohol/week: 0.0 standard drinks of alcohol    Comment: rare   Drug use: No   Sexual activity: Never    Birth control/protection: Surgical, Post-menopausal    Comment: 1st intercourse 89 yo-Fewer than 5 partners  Other Topics Concern   Not on file  Social History Narrative   Not on file   Social Drivers of Health   Tobacco Use: Low Risk (01/18/2024)   Patient History    Smoking Tobacco Use: Never    Smokeless Tobacco Use: Never    Passive Exposure: Not on file  Financial Resource Strain: Not on file  Food Insecurity: Not on file   Transportation Needs: Not on file  Physical Activity: Not on file  Stress: Not on file  Social Connections: Not on file  Intimate Partner Violence: Not on file  Depression (EYV7-0): Not on file  Alcohol Screen: Not on file  Housing: Not on file  Utilities: Not on file  Health Literacy: Not on file    Functional Status Survey:    Family History  Problem Relation Age of Onset   Hypertension Mother    Hyperlipidemia Mother    Heart failure Mother    Hypertension Father    Hyperlipidemia Father    Heart failure Father    Hyperlipidemia Brother    Hypertension Brother    Heart attack Brother    Breast cancer Neg Hx    Parkinson's disease Neg Hx     Health Maintenance  Topic Date Due   Zoster Vaccines- Shingrix (1 of 2) 03/09/1948   Cervical Cancer Screening (Pap smear)  12/17/2024 (Originally 03/15/2013)   COVID-19 Vaccine (8 - Moderna risk 2025-26 season) 05/15/2024   DTaP/Tdap/Td (3 - Td or Tdap) 05/24/2031   Pneumococcal Vaccine: 50+ Years  Completed   Influenza Vaccine  Completed   Bone Density Scan  Completed   Meningococcal B Vaccine  Aged Out   Mammogram  Discontinued    Allergies[1]  Outpatient Encounter Medications as of 01/08/2024  Medication Sig   acetaminophen  (TYLENOL ) 325 MG tablet Take 650 mg by mouth in the morning and at bedtime.   amLODipine  (NORVASC ) 5 MG tablet Take 5 mg by mouth daily.   carbidopa -levodopa  (SINEMET  IR) 25-100 MG tablet Take 1 tablet by mouth 4 (four) times daily. 0800, 1100, 1500, 2000   Carbidopa -Levodopa  ER (SINEMET  CR) 25-100 MG tablet controlled release Take 1 tablet by mouth at bedtime.   CRANBERRY-VITAMIN C -D MANNOSE PO Take 1 tablet by mouth 3 (three) times daily.   irbesartan  (AVAPRO ) 150 MG tablet Take 150 mg by mouth daily.   levothyroxine  (SYNTHROID ) 150 MCG tablet Take 1 tablet (150 mcg total) by mouth daily before breakfast.   LORazepam  (ATIVAN ) 0.5 MG tablet Take 0.5 mg by mouth every 12 (twelve) hours as needed for  anxiety.   polyethylene glycol (MIRALAX  / GLYCOLAX ) 17 g packet Take 17 g by mouth daily.   QUEtiapine  (SEROQUEL ) 25 MG tablet Take 0.5 tablets (12.5 mg total) by mouth every evening.   senna-docusate (SENOKOT-S) 8.6-50 MG tablet Take 1 tablet by mouth at bedtime.   traMADol  (ULTRAM ) 50 MG tablet Take 1 tablet (50 mg total) by mouth every 6 (six) hours as needed.   No facility-administered encounter medications on file  as of 01/08/2024.    Review of Systems  Unable to perform ROS: Dementia    Vitals:   01/08/24 0825  BP: (!) 142/75  Pulse: 64  Resp: 16  Temp: 97.7 F (36.5 C)  Weight: 140 lb (63.5 kg)   Body mass index is 24.8 kg/m. Physical Exam Vitals reviewed.  Constitutional:      Appearance: Normal appearance.  HENT:     Head: Normocephalic.     Nose: Nose normal.     Mouth/Throat:     Mouth: Mucous membranes are moist.     Pharynx: Oropharynx is clear.  Eyes:     Pupils: Pupils are equal, round, and reactive to light.  Cardiovascular:     Rate and Rhythm: Normal rate and regular rhythm.     Pulses: Normal pulses.     Heart sounds: Normal heart sounds. No murmur heard. Pulmonary:     Effort: Pulmonary effort is normal.     Breath sounds: Normal breath sounds.  Abdominal:     General: Abdomen is flat. Bowel sounds are normal.     Palpations: Abdomen is soft.  Musculoskeletal:        General: No swelling.     Cervical back: Neck supple.  Skin:    General: Skin is warm.  Neurological:     General: No focal deficit present.     Mental Status: She is alert.     Comments: Has Aphasia  Psychiatric:        Mood and Affect: Mood normal.        Thought Content: Thought content normal.     Labs reviewed: Basic Metabolic Panel: Recent Labs    11/27/23 0000  NA 140  K 3.9  CL 104  CO2 27*  BUN 38*  CREATININE 0.8  CALCIUM 8.9   Liver Function Tests: Recent Labs    11/27/23 0000  AST 17  ALT 10  ALKPHOS 107  ALBUMIN 3.4*   No results for  input(s): LIPASE, AMYLASE in the last 8760 hours. No results for input(s): AMMONIA in the last 8760 hours. CBC: Recent Labs    11/27/23 0000  WBC 7.5  HGB 12.1  HCT 36  PLT 223   Cardiac Enzymes: No results for input(s): CKTOTAL, CKMB, CKMBINDEX, TROPONINI in the last 8760 hours. BNP: Invalid input(s): POCBNP Lab Results  Component Value Date   HGBA1C 5.6 10/15/2013   Lab Results  Component Value Date   TSH 0.67 12/13/2022   Lab Results  Component Value Date   VITAMINB12 779 04/20/2021   No results found for: FOLATE No results found for: IRON, TIBC, FERRITIN  Imaging and Procedures obtained prior to SNF admission: CT Head Wo Contrast Result Date: 05/23/2021 CLINICAL DATA:  Patient lost her balance and fell backwards striking posterior head. Large scalp hematoma. EXAM: CT HEAD WITHOUT CONTRAST CT CERVICAL SPINE WITHOUT CONTRAST TECHNIQUE: Multidetector CT imaging of the head and cervical spine was performed following the standard protocol without intravenous contrast. Multiplanar CT image reconstructions of the cervical spine were also generated. RADIATION DOSE REDUCTION: This exam was performed according to the departmental dose-optimization program which includes automated exposure control, adjustment of the mA and/or kV according to patient size and/or use of iterative reconstruction technique. COMPARISON:  04/19/2021 head CT. FINDINGS: CT HEAD FINDINGS Brain: There is no evidence for acute hemorrhage, hydrocephalus, mass lesion, or abnormal extra-axial fluid collection. No definite CT evidence for acute infarction. Diffuse loss of parenchymal volume is consistent with atrophy. Patchy  low attenuation in the deep hemispheric and periventricular white matter is nonspecific, but likely reflects chronic microvascular ischemic demyelination. Vascular: No hyperdense vessel or unexpected calcification. Skull: No evidence for fracture. No worrisome lytic or sclerotic  lesion. Sinuses/Orbits: The visualized paranasal sinuses and mastoid air cells are clear. Visualized portions of the globes and intraorbital fat are unremarkable. Other: None. CT CERVICAL SPINE FINDINGS Alignment: Normal. Skull base and vertebrae: No acute fracture. No primary bone lesion or focal pathologic process. Soft tissues and spinal canal: No prevertebral fluid or swelling. No visible canal hematoma. Disc levels: Loss of disc height noted C5-6 and C6-7. Facets are well aligned bilaterally. Upper chest: Unremarkable. Other: None. IMPRESSION: 1. No acute intracranial abnormality. 2. Atrophy with chronic small vessel ischemic disease. 3. Degenerative changes in the cervical spine without acute fracture or traumatic subluxation. Electronically Signed   By: Camellia Candle M.D.   On: 05/23/2021 13:46   CT Cervical Spine Wo Contrast Result Date: 05/23/2021 CLINICAL DATA:  Patient lost her balance and fell backwards striking posterior head. Large scalp hematoma. EXAM: CT HEAD WITHOUT CONTRAST CT CERVICAL SPINE WITHOUT CONTRAST TECHNIQUE: Multidetector CT imaging of the head and cervical spine was performed following the standard protocol without intravenous contrast. Multiplanar CT image reconstructions of the cervical spine were also generated. RADIATION DOSE REDUCTION: This exam was performed according to the departmental dose-optimization program which includes automated exposure control, adjustment of the mA and/or kV according to patient size and/or use of iterative reconstruction technique. COMPARISON:  04/19/2021 head CT. FINDINGS: CT HEAD FINDINGS Brain: There is no evidence for acute hemorrhage, hydrocephalus, mass lesion, or abnormal extra-axial fluid collection. No definite CT evidence for acute infarction. Diffuse loss of parenchymal volume is consistent with atrophy. Patchy low attenuation in the deep hemispheric and periventricular white matter is nonspecific, but likely reflects chronic  microvascular ischemic demyelination. Vascular: No hyperdense vessel or unexpected calcification. Skull: No evidence for fracture. No worrisome lytic or sclerotic lesion. Sinuses/Orbits: The visualized paranasal sinuses and mastoid air cells are clear. Visualized portions of the globes and intraorbital fat are unremarkable. Other: None. CT CERVICAL SPINE FINDINGS Alignment: Normal. Skull base and vertebrae: No acute fracture. No primary bone lesion or focal pathologic process. Soft tissues and spinal canal: No prevertebral fluid or swelling. No visible canal hematoma. Disc levels: Loss of disc height noted C5-6 and C6-7. Facets are well aligned bilaterally. Upper chest: Unremarkable. Other: None. IMPRESSION: 1. No acute intracranial abnormality. 2. Atrophy with chronic small vessel ischemic disease. 3. Degenerative changes in the cervical spine without acute fracture or traumatic subluxation. Electronically Signed   By: Camellia Candle M.D.   On: 05/23/2021 13:46    Assessment/Plan 1. Hypothyroidism, postsurgical history of thyroid  cancer (Primary) Will Need follow up of her TSH  2. Severe dementia due to Parkinson's disease, with psychotic disturbance (HCC) Progressive  3. Recurrent major depressive disorder, in partial remission Off Klonopin   Just low dose of Seroquel   4. Parkinson's disease with dyskinesia and fluctuating manifestations (HCC) Sinemet  Total Dependent now Enrolled in hospice  5. Chronic bilateral low back pain without sciatica Tramadol  PRn  6. Essential hypertension Norvasc  and irbesartan     Family/ staff Communication:   Labs/tests ordered:     [1]  Allergies Allergen Reactions   Sulfa Antibiotics Rash   Biaxin [Clarithromycin] Other (See Comments)    Pt does not remember reaction    Ciprofloxacin Nausea And Vomiting   Hctz [Hydrochlorothiazide] Other (See Comments)    weakness and hyponatremia  Macrobid  [Nitrofurantoin ] Nausea Only   Other Nausea Only    Prednisone Other (See Comments)    Swelling and facial redness    Zofran  [Ondansetron ]    Sulfamethoxazole-Trimethoprim Other (See Comments) and Rash    Sore throat   "

## 2024-01-18 ENCOUNTER — Encounter: Payer: Self-pay | Admitting: Internal Medicine

## 2024-02-09 ENCOUNTER — Non-Acute Institutional Stay (SKILLED_NURSING_FACILITY): Payer: Self-pay | Admitting: Adult Health

## 2024-02-09 DIAGNOSIS — E890 Postprocedural hypothyroidism: Secondary | ICD-10-CM

## 2024-02-09 DIAGNOSIS — K5901 Slow transit constipation: Secondary | ICD-10-CM

## 2024-02-09 DIAGNOSIS — F02C2 Dementia in other diseases classified elsewhere, severe, with psychotic disturbance: Secondary | ICD-10-CM | POA: Diagnosis not present

## 2024-02-09 DIAGNOSIS — G20A1 Parkinson's disease without dyskinesia, without mention of fluctuations: Secondary | ICD-10-CM

## 2024-02-09 DIAGNOSIS — G20B2 Parkinson's disease with dyskinesia, with fluctuations: Secondary | ICD-10-CM

## 2024-02-09 DIAGNOSIS — M5137 Other intervertebral disc degeneration, lumbosacral region with discogenic back pain only: Secondary | ICD-10-CM

## 2024-02-09 DIAGNOSIS — I10 Essential (primary) hypertension: Secondary | ICD-10-CM | POA: Diagnosis not present

## 2024-02-11 ENCOUNTER — Encounter: Payer: Self-pay | Admitting: Adult Health

## 2024-02-11 NOTE — Assessment & Plan Note (Signed)
Continue miralax and senokot

## 2024-02-11 NOTE — Progress Notes (Signed)
 " Location:  [facility]  Provider:  Tawni America, NP    Patient Care Team: Charlanne Fredia CROME, MD as PCP - General (Internal Medicine) Claudene Victory ORN, MD (Inactive) as Consulting Physician (Cardiology)  Extended Emergency Contact Information Primary Emergency Contact: [facility] Address: [address]          [place], [place] [address] [place] Home Phone: [phone] Work Phone: [phone] Mobile Phone: [phone] Relation: Spouse Secondary Emergency Contact: Clendenin,Theodore          [place]  , [place] Home Phone: [phone] Relation: Granddaughter  Code Status:  DNR Goals of care: Advanced Directive information    05/31/2023   11:09 AM  Advanced Directives  Does Patient Have a Medical Advance Directive? Yes  Type of Advance Directive Out of facility DNR (pink MOST or yellow form)  Does patient want to make changes to medical advance directive? No - Patient declined     Chief Complaint  Patient presents with   Medical Management of Chronic Issues    HPI:  Pt is a 89 y.o. female seen today for medical management of chronic diseases.    Enrolled in hospice due to PD, dementia, and functional decline.   Has 1:1 caregiver at bedside  Low back pain: using tramadol  which is effective.   HTN: controlled  BUN 38/Cr 0.8 11/27/22  Wt Readings from Last 3 Encounters:  02/11/24 139 lb 9.6 oz (63.3 kg)  01/08/24 140 lb (63.5 kg)  12/18/23 138 lb 9.6 oz (62.9 kg)    Continues with PD symptoms tremor,slow movement, rigidity, and dysphonia. Rigidity to BUE and BLE L>R  Periods of lucidity with periods of confusion.  Periods of lethargy are noted as well.   Using ativan  at times for anxiety which is effective.  On seroquel  also.   Parkinson's related dementia:  CT head 05/23/21 noted atrophy with chronic small vessel ischemic changes. Off exelon   She is able to  communicate. Needs help with feeding and other ADls. Using a lift for transfer and wears briefs due to incontinence.   S/p thyroidectomy due to thyroid  ca, on synthroid  TSH 2.49 02/07/24  OP:T score -2.8 07/23/20, previous on reclast  per Dr Faythe in 2022.  Not currently on meds.   On miralax  and senokot, no c/o constipation   Past Medical History:  Diagnosis Date   Anxiety    Arthritis    knee & back    Blood type, Rh negative    Breast cancer (HCC)    Chronic fatigue    Depression    Diverticulosis    Endocervical polyp    GERD (gastroesophageal reflux disease)    Hypertension    Hypothyroidism    IBS (irritable bowel syndrome)    Insomnia    Lymphedema    Neuromuscular disorder (HCC)    Parkinson's disease    Osteopenia 06/2015   T score -2.4 distal third of the radius   Parkinson disease (HCC)    RBBB 10/07/2013   Thyroid  cancer (HCC)    Past Surgical History:  Procedure Laterality Date   APPENDECTOMY  1979   BREAST SURGERY  1979   MASTECTOMY AND RECONSTRUCTION   DILATION AND CURETTAGE OF UTERUS     MISCARRIAGE   EYE SURGERY Bilateral    w/ IOL   HYSTEROSCOPY  2005,2009   AND D&C   MASTECTOMY Bilateral    THYROIDECTOMY     TONSILLECTOMY  TOTAL KNEE ARTHROPLASTY Right 10/07/2013   Procedure: RIGHT TOTAL KNEE ARTHROPLASTY;  Surgeon: Dempsey JINNY Sensor, MD;  Location: [facility] OR;  Service: Orthopedics;  Laterality: Right;   TUBAL LIGATION      Allergies  Allergen Reactions   Sulfa Antibiotics Rash   Biaxin [Clarithromycin] Other (See Comments)    Pt does not remember reaction    Ciprofloxacin Nausea And Vomiting   Hctz [Hydrochlorothiazide] Other (See Comments)    weakness and hyponatremia   Macrobid  [Nitrofurantoin ] Nausea Only   Other Nausea Only   Prednisone Other (See Comments)    Swelling and facial redness    Zofran  [Ondansetron ]    Sulfamethoxazole-Trimethoprim Other (See Comments) and Rash    Sore throat    Outpatient Encounter Medications as of  02/09/2024  Medication Sig   acetaminophen  (TYLENOL ) 325 MG tablet Take 650 mg by mouth in the morning and at bedtime.   amLODipine  (NORVASC ) 5 MG tablet Take 5 mg by mouth daily.   carbidopa -levodopa  (SINEMET  IR) 25-100 MG tablet Take 1 tablet by mouth 4 (four) times daily. 0800, 1100, 1500, 2000   Carbidopa -Levodopa  ER (SINEMET  CR) 25-100 MG tablet controlled release Take 1 tablet by mouth at bedtime.   CRANBERRY-VITAMIN C -D MANNOSE PO Take 1 tablet by mouth 3 (three) times daily.   irbesartan  (AVAPRO ) 150 MG tablet Take 150 mg by mouth daily.   levothyroxine  (SYNTHROID ) 150 MCG tablet Take 1 tablet (150 mcg total) by mouth daily before breakfast.   LORazepam  (ATIVAN ) 0.5 MG tablet Take 0.5 mg by mouth every 12 (twelve) hours as needed for anxiety.   polyethylene glycol (MIRALAX  / GLYCOLAX ) 17 g packet Take 17 g by mouth daily.   QUEtiapine  (SEROQUEL ) 25 MG tablet Take 0.5 tablets (12.5 mg total) by mouth every evening.   senna-docusate (SENOKOT-S) 8.6-50 MG tablet Take 1 tablet by mouth at bedtime.   traMADol  (ULTRAM ) 50 MG tablet Take 1 tablet (50 mg total) by mouth every 6 (six) hours as needed.   No facility-administered encounter medications on file as of 02/09/2024.    Review of Systems  Unable to perform ROS: Dementia    Immunization History  Administered Date(s) Administered    sv, Bivalent, Protein Subunit Rsvpref,pf (Abrysvo) 02/08/2022   DTP 01/24/1994   Fluad Quad(high Dose 65+) 11/15/2022   Fluzone Influenza virus vaccine,trivalent (IIV3), split virus 10/23/2009, 10/26/2010, 11/18/2011, 11/21/2012, 12/10/2019   INFLUENZA, HIGH DOSE SEASONAL PF 11/05/2013, 10/31/2014, 10/12/2015, 10/20/2016, 11/15/2017   Influenza Split 10/09/2007, 10/17/2008   Influenza Whole 10/09/2007   Influenza,inj,Quad PF,6+ Mos 10/26/2010   Influenza-Unspecified 11/05/2013, 10/31/2014, 10/12/2015, 10/20/2016, 11/15/2017, 11/13/2020, 10/29/2021, 11/01/2023   Moderna Covid-19 Fall Seasonal Vaccine  44yrs & older 11/29/2021   Moderna Covid-19 Vaccine Bivalent Booster 45yrs & up 05/19/2022, 11/15/2022   Moderna SARS-COV2 Booster Vaccination 11/28/2019, 05/28/2020, 11/06/2020   Moderna Sars-Covid-2 Vaccination 01/29/2019, 02/28/2019, 06/28/2021   PPD Test 06/17/2021   Pneumococcal Conjugate-13 10/23/2014   Pneumococcal Polysaccharide-23 10/25/2002   Tdap 05/23/2021   Unspecified SARS-COV-2 Vaccination 11/15/2023   Zoster, Live 10/13/2005   Zoster, Unspecified 10/13/2005   Pertinent  Health Maintenance Due  Topic Date Due   Influenza Vaccine  Completed   Bone Density Scan  Completed   Mammogram  Discontinued      04/29/2021   12:25 PM 05/23/2021   12:01 PM 07/11/2022    4:41 PM 12/05/2022   11:17 AM 01/06/2023   10:24 AM  Fall Risk  Falls in the past year?   1 0 0  Was  there an injury with Fall?   0  0  0   Fall Risk Category Calculator   2 0 0  (RETIRED) Patient Fall Risk Level Low fall risk  Moderate fall risk      Patient at Risk for Falls Due to   History of fall(s);Impaired balance/gait;Impaired mobility No Fall Risks No Fall Risks  Fall risk Follow up   Falls evaluation completed Falls evaluation completed Falls evaluation completed     Data saved with a previous flowsheet row definition   Functional Status Survey:    Vitals:   02/11/24 0840  Weight: 139 lb 9.6 oz (63.3 kg)   Body mass index is 24.73 kg/m. Physical Exam Vitals and nursing note reviewed.  Constitutional:      General: She is not in acute distress.    Appearance: She is not diaphoretic.  HENT:     Head: Normocephalic and atraumatic.  Neck:     Vascular: No JVD.  Cardiovascular:     Rate and Rhythm: Normal rate and regular rhythm.     Heart sounds: No murmur heard. Pulmonary:     Effort: Pulmonary effort is normal. No respiratory distress.     Breath sounds: Normal breath sounds. No wheezing.  Abdominal:     General: Abdomen is flat. Bowel sounds are normal. There is no distension.      Palpations: Abdomen is soft.     Tenderness: There is no abdominal tenderness.  Musculoskeletal:     Comments: Strength to BLE 3/5 Rigidity to BUE and BLE L>R  Skin:    General: Skin is warm and dry.  Neurological:     General: No focal deficit present.     Mental Status: She is alert. Mental status is at baseline.  Psychiatric:        Mood and Affect: Mood normal.     Labs reviewed: Recent Labs    11/27/23 0000  NA 140  K 3.9  CL 104  CO2 27*  BUN 38*  CREATININE 0.8  CALCIUM 8.9   Recent Labs    11/27/23 0000  AST 17  ALT 10  ALKPHOS 107  ALBUMIN 3.4*   Recent Labs    11/27/23 0000  WBC 7.5  HGB 12.1  HCT 36  PLT 223   Lab Results  Component Value Date   TSH 0.67 12/13/2022   Lab Results  Component Value Date   HGBA1C 5.6 10/15/2013   No results found for: CHOL, HDL, LDLCALC, LDLDIRECT, TRIG, CHOLHDL  Significant Diagnostic Results in last 30 days:  No results found.  Assessment/Plan  Severe dementia due to Parkinson's disease, with psychotic disturbance (HCC) Progressive decline in cognition and physical function c/w the disease. Continue supportive care in the skilled environment. Continue seroquel  for delusion thinking and anxiety   Hypothyroidism, postsurgical history of thyroid  cancer Continue levothyroxine .   Essential hypertension Controlled Cr ok, BUN elevated likely due to fluid intake.  Encourage fluid intake.   Parkinson's disease (tremor, stiffness, slow motion, unstable posture) (HCC) Severe stage Continues on sinemet  Followed by hospice  Slow transit constipation Continue miralax  and senokot.   DDD (degenerative disc disease), lumbosacral Continue tramadol  and tylenol       "

## 2024-02-11 NOTE — Assessment & Plan Note (Signed)
 Severe stage Continues on sinemet  Followed by hospice

## 2024-02-11 NOTE — Assessment & Plan Note (Signed)
 Continue levothyroxine 

## 2024-02-11 NOTE — Assessment & Plan Note (Signed)
 Continue tramadol  and tylenol 

## 2024-02-11 NOTE — Assessment & Plan Note (Signed)
 Controlled Cr ok, BUN elevated likely due to fluid intake.  Encourage fluid intake.

## 2024-02-11 NOTE — Assessment & Plan Note (Signed)
 Progressive decline in cognition and physical function c/w the disease. Continue supportive care in the skilled environment. Continue seroquel  for delusion thinking and anxiety
